# Patient Record
Sex: Male | Born: 1945 | Race: White | Hispanic: No | Marital: Married | Smoking: Light tobacco smoker
Health system: Southern US, Community
[De-identification: ages and names within clinical notes are randomized; demographics above are authoritative.]

## PROBLEM LIST (undated history)

## (undated) DIAGNOSIS — I10 Essential (primary) hypertension: Secondary | ICD-10-CM

## (undated) DIAGNOSIS — Z720 Tobacco use: Secondary | ICD-10-CM

## (undated) DIAGNOSIS — E785 Hyperlipidemia, unspecified: Secondary | ICD-10-CM

## (undated) DIAGNOSIS — R413 Other amnesia: Secondary | ICD-10-CM

## (undated) DIAGNOSIS — I219 Acute myocardial infarction, unspecified: Secondary | ICD-10-CM

## (undated) DIAGNOSIS — H269 Unspecified cataract: Secondary | ICD-10-CM

## (undated) DIAGNOSIS — E119 Type 2 diabetes mellitus without complications: Secondary | ICD-10-CM

## (undated) DIAGNOSIS — M1990 Unspecified osteoarthritis, unspecified site: Secondary | ICD-10-CM

## (undated) DIAGNOSIS — J189 Pneumonia, unspecified organism: Secondary | ICD-10-CM

## (undated) DIAGNOSIS — Z9981 Dependence on supplemental oxygen: Secondary | ICD-10-CM

## (undated) DIAGNOSIS — J449 Chronic obstructive pulmonary disease, unspecified: Secondary | ICD-10-CM

## (undated) DIAGNOSIS — I480 Paroxysmal atrial fibrillation: Secondary | ICD-10-CM

## (undated) DIAGNOSIS — I5032 Chronic diastolic (congestive) heart failure: Secondary | ICD-10-CM

## (undated) DIAGNOSIS — I639 Cerebral infarction, unspecified: Secondary | ICD-10-CM

## (undated) HISTORY — DX: Hyperlipidemia, unspecified: E78.5

## (undated) HISTORY — DX: Tobacco use: Z72.0

## (undated) HISTORY — DX: Essential (primary) hypertension: I10

## (undated) HISTORY — DX: Paroxysmal atrial fibrillation: I48.0

## (undated) HISTORY — PX: JOINT REPLACEMENT: SHX530

## (undated) HISTORY — DX: Other amnesia: R41.3

## (undated) HISTORY — DX: Unspecified cataract: H26.9

## (undated) HISTORY — PX: CARDIAC CATHETERIZATION: SHX172

## (undated) HISTORY — DX: Chronic diastolic (congestive) heart failure: I50.32

## (undated) HISTORY — DX: Type 2 diabetes mellitus without complications: E11.9

## (undated) HISTORY — PX: CORONARY ANGIOPLASTY: SHX604

## (undated) HISTORY — PX: ROTATOR CUFF REPAIR: SHX139

## (undated) HISTORY — PX: TONSILLECTOMY: SUR1361

## (undated) HISTORY — PX: EYE SURGERY: SHX253

## (undated) HISTORY — PX: COLONOSCOPY W/ POLYPECTOMY: SHX1380

---

## 1965-12-14 DIAGNOSIS — R413 Other amnesia: Secondary | ICD-10-CM

## 1965-12-14 HISTORY — DX: Other amnesia: R41.3

## 1999-11-25 ENCOUNTER — Encounter: Payer: Self-pay | Admitting: Specialist

## 1999-11-28 ENCOUNTER — Inpatient Hospital Stay (HOSPITAL_COMMUNITY): Admission: RE | Admit: 1999-11-28 | Discharge: 1999-12-02 | Payer: Self-pay | Admitting: Specialist

## 2000-08-02 ENCOUNTER — Emergency Department (HOSPITAL_COMMUNITY): Admission: EM | Admit: 2000-08-02 | Discharge: 2000-08-02 | Payer: Self-pay | Admitting: Emergency Medicine

## 2000-08-02 ENCOUNTER — Encounter: Payer: Self-pay | Admitting: *Deleted

## 2003-10-08 ENCOUNTER — Observation Stay (HOSPITAL_COMMUNITY): Admission: EM | Admit: 2003-10-08 | Discharge: 2003-10-09 | Payer: Self-pay | Admitting: *Deleted

## 2003-10-08 ENCOUNTER — Encounter: Payer: Self-pay | Admitting: *Deleted

## 2004-05-19 ENCOUNTER — Encounter: Admission: RE | Admit: 2004-05-19 | Discharge: 2004-06-05 | Payer: Self-pay | Admitting: Specialist

## 2004-11-10 ENCOUNTER — Emergency Department (HOSPITAL_COMMUNITY): Admission: EM | Admit: 2004-11-10 | Discharge: 2004-11-10 | Payer: Self-pay | Admitting: Emergency Medicine

## 2004-11-24 ENCOUNTER — Ambulatory Visit: Payer: Self-pay | Admitting: Family Medicine

## 2005-01-05 ENCOUNTER — Ambulatory Visit: Payer: Self-pay | Admitting: Family Medicine

## 2005-01-21 ENCOUNTER — Emergency Department (HOSPITAL_COMMUNITY): Admission: EM | Admit: 2005-01-21 | Discharge: 2005-01-21 | Payer: Self-pay | Admitting: Emergency Medicine

## 2005-02-02 ENCOUNTER — Ambulatory Visit: Payer: Self-pay | Admitting: Family Medicine

## 2005-03-02 ENCOUNTER — Ambulatory Visit: Payer: Self-pay | Admitting: Family Medicine

## 2005-04-21 ENCOUNTER — Ambulatory Visit: Payer: Self-pay | Admitting: Family Medicine

## 2006-03-18 ENCOUNTER — Ambulatory Visit: Payer: Self-pay | Admitting: Cardiology

## 2006-03-18 ENCOUNTER — Observation Stay (HOSPITAL_COMMUNITY): Admission: EM | Admit: 2006-03-18 | Discharge: 2006-03-19 | Payer: Self-pay | Admitting: Emergency Medicine

## 2006-03-31 ENCOUNTER — Ambulatory Visit: Payer: Self-pay | Admitting: Cardiology

## 2006-04-07 ENCOUNTER — Ambulatory Visit: Payer: Self-pay | Admitting: Cardiology

## 2006-04-16 ENCOUNTER — Ambulatory Visit: Payer: Self-pay | Admitting: Cardiology

## 2006-06-07 ENCOUNTER — Ambulatory Visit: Payer: Self-pay | Admitting: Family Medicine

## 2006-08-09 ENCOUNTER — Ambulatory Visit: Payer: Self-pay | Admitting: Family Medicine

## 2006-12-21 ENCOUNTER — Ambulatory Visit: Payer: Self-pay | Admitting: Family Medicine

## 2007-01-11 ENCOUNTER — Ambulatory Visit: Payer: Self-pay | Admitting: Internal Medicine

## 2007-01-27 ENCOUNTER — Ambulatory Visit: Payer: Self-pay | Admitting: Internal Medicine

## 2007-02-08 ENCOUNTER — Ambulatory Visit: Payer: Self-pay | Admitting: Family Medicine

## 2012-01-15 ENCOUNTER — Encounter: Payer: Self-pay | Admitting: Internal Medicine

## 2012-01-20 ENCOUNTER — Ambulatory Visit: Payer: Self-pay | Admitting: Physical Therapy

## 2012-01-27 ENCOUNTER — Ambulatory Visit: Payer: Self-pay | Admitting: Physical Therapy

## 2012-02-01 ENCOUNTER — Ambulatory Visit: Payer: 59 | Attending: Family Medicine | Admitting: Physical Therapy

## 2012-02-01 DIAGNOSIS — M542 Cervicalgia: Secondary | ICD-10-CM | POA: Insufficient documentation

## 2012-02-01 DIAGNOSIS — R5381 Other malaise: Secondary | ICD-10-CM | POA: Insufficient documentation

## 2012-02-01 DIAGNOSIS — IMO0001 Reserved for inherently not codable concepts without codable children: Secondary | ICD-10-CM | POA: Insufficient documentation

## 2012-02-03 ENCOUNTER — Ambulatory Visit: Payer: 59 | Admitting: Physical Therapy

## 2012-02-08 ENCOUNTER — Ambulatory Visit: Payer: 59 | Admitting: Physical Therapy

## 2012-02-11 ENCOUNTER — Ambulatory Visit: Payer: 59 | Admitting: Physical Therapy

## 2012-02-15 ENCOUNTER — Ambulatory Visit: Payer: 59 | Attending: Family Medicine | Admitting: Physical Therapy

## 2012-02-15 DIAGNOSIS — R5381 Other malaise: Secondary | ICD-10-CM | POA: Insufficient documentation

## 2012-02-15 DIAGNOSIS — M542 Cervicalgia: Secondary | ICD-10-CM | POA: Insufficient documentation

## 2012-02-15 DIAGNOSIS — M2569 Stiffness of other specified joint, not elsewhere classified: Secondary | ICD-10-CM | POA: Insufficient documentation

## 2012-02-15 DIAGNOSIS — IMO0001 Reserved for inherently not codable concepts without codable children: Secondary | ICD-10-CM | POA: Insufficient documentation

## 2012-02-17 ENCOUNTER — Ambulatory Visit: Payer: 59 | Admitting: Physical Therapy

## 2012-02-22 ENCOUNTER — Ambulatory Visit: Payer: 59 | Admitting: Physical Therapy

## 2012-02-24 ENCOUNTER — Ambulatory Visit: Payer: 59 | Admitting: Physical Therapy

## 2012-02-29 ENCOUNTER — Ambulatory Visit: Payer: 59 | Admitting: Physical Therapy

## 2012-03-02 ENCOUNTER — Ambulatory Visit: Payer: 59 | Admitting: Physical Therapy

## 2012-03-07 ENCOUNTER — Ambulatory Visit: Payer: 59 | Admitting: Physical Therapy

## 2012-03-09 ENCOUNTER — Ambulatory Visit: Payer: 59 | Admitting: Physical Therapy

## 2012-03-14 ENCOUNTER — Ambulatory Visit: Payer: 59 | Attending: Family Medicine | Admitting: Physical Therapy

## 2012-03-14 DIAGNOSIS — IMO0001 Reserved for inherently not codable concepts without codable children: Secondary | ICD-10-CM | POA: Insufficient documentation

## 2012-03-14 DIAGNOSIS — M2569 Stiffness of other specified joint, not elsewhere classified: Secondary | ICD-10-CM | POA: Insufficient documentation

## 2012-03-14 DIAGNOSIS — R5381 Other malaise: Secondary | ICD-10-CM | POA: Insufficient documentation

## 2012-03-14 DIAGNOSIS — M542 Cervicalgia: Secondary | ICD-10-CM | POA: Insufficient documentation

## 2012-03-16 ENCOUNTER — Ambulatory Visit: Payer: 59 | Admitting: Physical Therapy

## 2012-03-21 ENCOUNTER — Ambulatory Visit: Payer: 59 | Admitting: Physical Therapy

## 2012-03-23 ENCOUNTER — Ambulatory Visit: Payer: 59 | Admitting: Physical Therapy

## 2012-03-28 ENCOUNTER — Ambulatory Visit: Payer: 59 | Admitting: Physical Therapy

## 2012-03-28 ENCOUNTER — Encounter: Payer: 59 | Admitting: Physical Therapy

## 2012-03-30 ENCOUNTER — Encounter: Payer: 59 | Admitting: Physical Therapy

## 2012-05-05 ENCOUNTER — Other Ambulatory Visit: Payer: Self-pay | Admitting: Neurological Surgery

## 2012-05-06 ENCOUNTER — Encounter (HOSPITAL_COMMUNITY): Payer: Self-pay | Admitting: Pharmacy Technician

## 2012-05-12 ENCOUNTER — Encounter (HOSPITAL_COMMUNITY): Payer: Self-pay | Admitting: Surgery

## 2012-05-12 NOTE — Pre-Procedure Instructions (Signed)
[address] GIL  05/12/2012   Your procedure is scheduled on:  Monday May 16, 2012.  Report to [facility] at 0700 AM.  Call this number if you have problems the morning of surgery: [phone]   Remember:   Do not eat food:After Midnight.  May have clear liquids: up to 4 Hours before arrival until 0300 am.  Clear liquids include soda, tea, black coffee, apple or grape juice, broth.  Take these medicines the morning of surgery with A SIP OF WATER: Nebivolol (Bystolic), and Oxycodone (Oxycontin) if needed for pain.   Do not wear jewelry  Do not wear lotions, powders, or cologne . You may wear deodorant.  Men may shave face and neck.  Do not bring valuables to the hospital.  Contacts, dentures or bridgework may not be worn into surgery.  Leave suitcase in the car. After surgery it may be brought to your room.  For patients admitted to the hospital, checkout time is 11:00 AM the day of discharge.   Patients discharged the day of surgery will not be allowed to drive home.  Name and phone number of your driver:   Special Instructions: CHG Shower Use Special Wash: 1/2 bottle night before surgery and 1/2 bottle morning of surgery.   Please read over the following fact sheets that you were given: Pain Booklet, Coughing and Deep Breathing, MRSA Information and Surgical Site Infection Prevention

## 2012-05-13 ENCOUNTER — Encounter (HOSPITAL_COMMUNITY)
Admission: RE | Admit: 2012-05-13 | Discharge: 2012-05-13 | Disposition: A | Discharge status: Home / Self Care | Payer: 59 | Source: Ambulatory Visit | Attending: Neurological Surgery | Admitting: Neurological Surgery

## 2012-05-13 LAB — CBC
HCT: 47.5 % (ref 39.0–52.0)
Platelets: 238 10*3/uL (ref 150–400)
RDW: 13.9 % (ref 11.5–15.5)
WBC: 8.9 10*3/uL (ref 4.0–10.5)

## 2012-05-13 LAB — COMPREHENSIVE METABOLIC PANEL
ALT: 9 U/L (ref 0–53)
AST: 13 U/L (ref 0–37)
Albumin: 3.6 g/dL (ref 3.5–5.2)
Alkaline Phosphatase: 93 U/L (ref 39–117)
Chloride: 99 mEq/L (ref 96–112)
Potassium: 3.9 mEq/L (ref 3.5–5.1)
Sodium: 136 mEq/L (ref 135–145)
Total Bilirubin: 0.4 mg/dL (ref 0.3–1.2)

## 2012-05-13 LAB — SURGICAL PCR SCREEN: MRSA, PCR: NEGATIVE

## 2012-05-13 NOTE — Progress Notes (Signed)
Received today notes on this patient from NP Paulita Cradle.  Pt has also seen Dr Tommi Rumps Natasha Bence . For chest pain that  Was called "pleuritic ".  Notes  Were placed in this chart.

## 2012-05-15 MED ORDER — CEFAZOLIN SODIUM 1-5 GM-% IV SOLN
1.0000 g | INTRAVENOUS | Status: AC
  Administered 2012-05-16: 1 g via INTRAVENOUS
  Filled 2012-05-15: qty 50

## 2012-05-16 ENCOUNTER — Encounter (HOSPITAL_COMMUNITY): Payer: Self-pay | Admitting: Anesthesiology

## 2012-05-16 ENCOUNTER — Inpatient Hospital Stay (HOSPITAL_COMMUNITY)
Admission: RE | Admit: 2012-05-16 | Discharge: 2012-05-16 | DRG: 473 | Disposition: A | Discharge status: Home / Self Care | Payer: 59 | Source: Ambulatory Visit | Attending: Neurological Surgery | Admitting: Neurological Surgery

## 2012-05-16 ENCOUNTER — Ambulatory Visit (HOSPITAL_COMMUNITY): Payer: 59

## 2012-05-16 ENCOUNTER — Inpatient Hospital Stay (HOSPITAL_COMMUNITY): Payer: 59

## 2012-05-16 ENCOUNTER — Encounter (HOSPITAL_COMMUNITY)
Admission: RE | Disposition: A | Discharge status: Home / Self Care | Payer: Self-pay | Source: Ambulatory Visit | Attending: Neurological Surgery

## 2012-05-16 ENCOUNTER — Encounter (HOSPITAL_COMMUNITY): Payer: Self-pay | Admitting: *Deleted

## 2012-05-16 ENCOUNTER — Ambulatory Visit (HOSPITAL_COMMUNITY): Payer: 59 | Admitting: Anesthesiology

## 2012-05-16 DIAGNOSIS — M5000 Cervical disc disorder with myelopathy, unspecified cervical region: Principal | ICD-10-CM | POA: Diagnosis present

## 2012-05-16 DIAGNOSIS — M47812 Spondylosis without myelopathy or radiculopathy, cervical region: Secondary | ICD-10-CM

## 2012-05-16 DIAGNOSIS — Z9861 Coronary angioplasty status: Secondary | ICD-10-CM

## 2012-05-16 DIAGNOSIS — F17200 Nicotine dependence, unspecified, uncomplicated: Secondary | ICD-10-CM | POA: Diagnosis present

## 2012-05-16 DIAGNOSIS — I252 Old myocardial infarction: Secondary | ICD-10-CM

## 2012-05-16 DIAGNOSIS — I2510 Atherosclerotic heart disease of native coronary artery without angina pectoris: Secondary | ICD-10-CM | POA: Diagnosis present

## 2012-05-16 DIAGNOSIS — Z01812 Encounter for preprocedural laboratory examination: Secondary | ICD-10-CM

## 2012-05-16 HISTORY — DX: Unspecified osteoarthritis, unspecified site: M19.90

## 2012-05-16 HISTORY — PX: ANTERIOR CERVICAL DECOMP/DISCECTOMY FUSION: SHX1161

## 2012-05-16 HISTORY — DX: Pneumonia, unspecified organism: J18.9

## 2012-05-16 HISTORY — DX: Acute myocardial infarction, unspecified: I21.9

## 2012-05-16 SURGERY — ANTERIOR CERVICAL DECOMPRESSION/DISCECTOMY FUSION 2 LEVELS
Anesthesia: General | Site: Neck | Wound class: Clean

## 2012-05-16 MED ORDER — MENTHOL 3 MG MT LOZG: 1.0000 | LOZENGE | OROMUCOSAL | Status: DC | PRN

## 2012-05-16 MED ORDER — FENTANYL CITRATE 0.05 MG/ML IJ SOLN
INTRAMUSCULAR | Status: DC | PRN
  Administered 2012-05-16: 100 ug via INTRAVENOUS
  Administered 2012-05-16: 50 ug via INTRAVENOUS
  Administered 2012-05-16: 100 ug via INTRAVENOUS

## 2012-05-16 MED ORDER — OXYCODONE HCL 10 MG PO TB12: 10.0000 mg | ORAL_TABLET | Freq: Two times a day (BID) | ORAL | Status: DC

## 2012-05-16 MED ORDER — BACITRACIN 50000 UNITS IM SOLR
INTRAMUSCULAR | Status: AC
  Filled 2012-05-16: qty 1

## 2012-05-16 MED ORDER — HYDROMORPHONE HCL PF 1 MG/ML IJ SOLN
0.2500 mg | INTRAMUSCULAR | Status: DC | PRN
  Administered 2012-05-16 (×3): 0.5 mg via INTRAVENOUS

## 2012-05-16 MED ORDER — THROMBIN 5000 UNITS EX KIT
PACK | CUTANEOUS | Status: DC | PRN
  Administered 2012-05-16 (×2): 5000 [IU] via TOPICAL

## 2012-05-16 MED ORDER — SODIUM CHLORIDE 0.9 % IJ SOLN: 3.0000 mL | INTRAMUSCULAR | Status: DC | PRN

## 2012-05-16 MED ORDER — DIAZEPAM 5 MG PO TABS: 5.0000 mg | ORAL_TABLET | Freq: Four times a day (QID) | ORAL | Status: AC | PRN

## 2012-05-16 MED ORDER — ROCURONIUM BROMIDE 100 MG/10ML IV SOLN
INTRAVENOUS | Status: DC | PRN
  Administered 2012-05-16: 10 mg via INTRAVENOUS
  Administered 2012-05-16: 50 mg via INTRAVENOUS

## 2012-05-16 MED ORDER — ALUM & MAG HYDROXIDE-SIMETH 200-200-20 MG/5ML PO SUSP: 30.0000 mL | Freq: Four times a day (QID) | ORAL | Status: DC | PRN

## 2012-05-16 MED ORDER — LACTATED RINGERS IV SOLN
INTRAVENOUS | Status: DC | PRN
  Administered 2012-05-16 (×2): via INTRAVENOUS

## 2012-05-16 MED ORDER — HYDROMORPHONE HCL PF 1 MG/ML IJ SOLN
INTRAMUSCULAR | Status: AC
  Administered 2012-05-16: 0.5 mg via INTRAVENOUS
  Filled 2012-05-16: qty 1

## 2012-05-16 MED ORDER — LIDOCAINE-EPINEPHRINE 1 %-1:100000 IJ SOLN
INTRAMUSCULAR | Status: DC | PRN
  Administered 2012-05-16: 5 mL

## 2012-05-16 MED ORDER — OXYCODONE-ACETAMINOPHEN 5-325 MG PO TABS: 1.0000 | ORAL_TABLET | ORAL | Status: AC | PRN

## 2012-05-16 MED ORDER — ACETAMINOPHEN 650 MG RE SUPP: 650.0000 mg | RECTAL | Status: DC | PRN

## 2012-05-16 MED ORDER — 0.9 % SODIUM CHLORIDE (POUR BTL) OPTIME
TOPICAL | Status: DC | PRN
  Administered 2012-05-16: 1000 mL

## 2012-05-16 MED ORDER — LIDOCAINE HCL 4 % MT SOLN
OROMUCOSAL | Status: DC | PRN
  Administered 2012-05-16: 4 mL via TOPICAL

## 2012-05-16 MED ORDER — ONDANSETRON HCL 4 MG/2ML IJ SOLN
INTRAMUSCULAR | Status: DC | PRN
  Administered 2012-05-16: 4 mg via INTRAVENOUS

## 2012-05-16 MED ORDER — PHENOL 1.4 % MT LIQD: 1.0000 | OROMUCOSAL | Status: DC | PRN

## 2012-05-16 MED ORDER — SODIUM CHLORIDE 0.9 % IJ SOLN: 3.0000 mL | Freq: Two times a day (BID) | INTRAMUSCULAR | Status: DC

## 2012-05-16 MED ORDER — SODIUM CHLORIDE 0.9 % IR SOLN
Status: DC | PRN
  Administered 2012-05-16: 11:00:00

## 2012-05-16 MED ORDER — BUPIVACAINE HCL (PF) 0.5 % IJ SOLN
INTRAMUSCULAR | Status: DC | PRN
  Administered 2012-05-16: 5 mL

## 2012-05-16 MED ORDER — NEBIVOLOL HCL 5 MG PO TABS: 5.0000 mg | ORAL_TABLET | Freq: Every day | ORAL | Status: DC

## 2012-05-16 MED ORDER — OXYCODONE-ACETAMINOPHEN 5-325 MG PO TABS
1.0000 | ORAL_TABLET | ORAL | Status: DC | PRN
  Administered 2012-05-16: 2 via ORAL
  Filled 2012-05-16: qty 2

## 2012-05-16 MED ORDER — DEXAMETHASONE SODIUM PHOSPHATE 4 MG/ML IJ SOLN
INTRAMUSCULAR | Status: DC | PRN
  Administered 2012-05-16: 10 mg via INTRAVENOUS

## 2012-05-16 MED ORDER — SODIUM CHLORIDE 0.9 % IV SOLN: 250.0000 mL | INTRAVENOUS | Status: DC

## 2012-05-16 MED ORDER — LIDOCAINE HCL (CARDIAC) 20 MG/ML IV SOLN
INTRAVENOUS | Status: DC | PRN
  Administered 2012-05-16: 100 mg via INTRAVENOUS

## 2012-05-16 MED ORDER — EPHEDRINE SULFATE 50 MG/ML IJ SOLN
INTRAMUSCULAR | Status: DC | PRN
  Administered 2012-05-16 (×3): 5 mg via INTRAVENOUS

## 2012-05-16 MED ORDER — PROPOFOL 10 MG/ML IV EMUL
INTRAVENOUS | Status: DC | PRN
  Administered 2012-05-16: 200 mg via INTRAVENOUS

## 2012-05-16 MED ORDER — DIAZEPAM 5 MG PO TABS
5.0000 mg | ORAL_TABLET | Freq: Four times a day (QID) | ORAL | Status: DC | PRN
  Administered 2012-05-16: 5 mg via ORAL
  Filled 2012-05-16: qty 1

## 2012-05-16 MED ORDER — HEMOSTATIC AGENTS (NO CHARGE) OPTIME
TOPICAL | Status: DC | PRN
  Administered 2012-05-16: 1 via TOPICAL

## 2012-05-16 MED ORDER — MORPHINE SULFATE 2 MG/ML IJ SOLN: 1.0000 mg | INTRAMUSCULAR | Status: DC | PRN

## 2012-05-16 MED ORDER — ONDANSETRON HCL 4 MG/2ML IJ SOLN: 4.0000 mg | INTRAMUSCULAR | Status: DC | PRN

## 2012-05-16 MED ORDER — ONDANSETRON HCL 4 MG/2ML IJ SOLN: 4.0000 mg | Freq: Once | INTRAMUSCULAR | Status: DC | PRN

## 2012-05-16 MED ORDER — ACETAMINOPHEN 325 MG PO TABS: 650.0000 mg | ORAL_TABLET | ORAL | Status: DC | PRN

## 2012-05-16 MED ORDER — SODIUM CHLORIDE 0.9 % IV SOLN
INTRAVENOUS | Status: AC
  Filled 2012-05-16: qty 500

## 2012-05-16 MED ORDER — HYDROMORPHONE HCL PF 1 MG/ML IJ SOLN
INTRAMUSCULAR | Status: AC
  Filled 2012-05-16: qty 1

## 2012-05-16 SURGICAL SUPPLY — 55 items
ADH SKN CLS APL DERMABOND .7 (GAUZE/BANDAGES/DRESSINGS) ×1
BAG DECANTER FOR FLEXI CONT (MISCELLANEOUS) ×2 IMPLANT
BANDAGE GAUZE ELAST BULKY 4 IN (GAUZE/BANDAGES/DRESSINGS) ×2 IMPLANT
BIT DRILL 14MM (INSTRUMENTS) IMPLANT
BIT DRILL NEURO 2X3.1 SFT TUCH (MISCELLANEOUS) ×1 IMPLANT
BONE CERVICAL 6MM LRG (Bone Implant) ×2 IMPLANT
BUR BARREL STRAIGHT FLUTE 4.0 (BURR) ×2 IMPLANT
CANISTER SUCTION 2500CC (MISCELLANEOUS) ×2 IMPLANT
CLOTH BEACON ORANGE TIMEOUT ST (SAFETY) ×2 IMPLANT
CONT SPEC 4OZ CLIKSEAL STRL BL (MISCELLANEOUS) ×2 IMPLANT
DECANTER SPIKE VIAL GLASS SM (MISCELLANEOUS) ×2 IMPLANT
DERMABOND ADVANCED (GAUZE/BANDAGES/DRESSINGS) ×1
DERMABOND ADVANCED .7 DNX12 (GAUZE/BANDAGES/DRESSINGS) ×1 IMPLANT
DRAPE LAPAROTOMY 100X72 PEDS (DRAPES) ×2 IMPLANT
DRAPE MICROSCOPE LEICA (MISCELLANEOUS) IMPLANT
DRAPE POUCH INSTRU U-SHP 10X18 (DRAPES) ×2 IMPLANT
DRILL 14MM (INSTRUMENTS) ×2
DRILL NEURO 2X3.1 SOFT TOUCH (MISCELLANEOUS) ×2
DURAPREP 6ML APPLICATOR 50/CS (WOUND CARE) ×2 IMPLANT
ELECT REM PT RETURN 9FT ADLT (ELECTROSURGICAL) ×2
ELECTRODE REM PT RTRN 9FT ADLT (ELECTROSURGICAL) ×1 IMPLANT
GAUZE SPONGE 4X4 16PLY XRAY LF (GAUZE/BANDAGES/DRESSINGS) IMPLANT
GLOVE BIOGEL PI IND STRL 8.5 (GLOVE) ×1 IMPLANT
GLOVE BIOGEL PI INDICATOR 8.5 (GLOVE) ×1
GLOVE ECLIPSE 8.5 STRL (GLOVE) ×3 IMPLANT
GLOVE EXAM NITRILE LRG STRL (GLOVE) IMPLANT
GLOVE EXAM NITRILE MD LF STRL (GLOVE) IMPLANT
GLOVE EXAM NITRILE XL STR (GLOVE) IMPLANT
GLOVE EXAM NITRILE XS STR PU (GLOVE) IMPLANT
GLOVE INDICATOR 7.0 STRL GRN (GLOVE) ×1 IMPLANT
GLOVE SURG SS PI 6.5 STRL IVOR (GLOVE) ×2 IMPLANT
GOWN BRE IMP SLV AUR LG STRL (GOWN DISPOSABLE) ×1 IMPLANT
GOWN BRE IMP SLV AUR XL STRL (GOWN DISPOSABLE) ×1 IMPLANT
GOWN STRL REIN 2XL LVL4 (GOWN DISPOSABLE) ×2 IMPLANT
HEAD HALTER (SOFTGOODS) ×2 IMPLANT
KIT BASIN OR (CUSTOM PROCEDURE TRAY) ×2 IMPLANT
KIT ROOM TURNOVER OR (KITS) ×2 IMPLANT
NDL SPNL 22GX3.5 QUINCKE BK (NEEDLE) ×1 IMPLANT
NEEDLE HYPO 22GX1.5 SAFETY (NEEDLE) ×2 IMPLANT
NEEDLE SPNL 22GX3.5 QUINCKE BK (NEEDLE) ×4 IMPLANT
NS IRRIG 1000ML POUR BTL (IV SOLUTION) ×2 IMPLANT
PACK LAMINECTOMY NEURO (CUSTOM PROCEDURE TRAY) ×2 IMPLANT
PAD ARMBOARD 7.5X6 YLW CONV (MISCELLANEOUS) ×6 IMPLANT
PLATE 32MM (Plate) ×1 IMPLANT
PUTTY BONE 2.5CC ×1 IMPLANT
RUBBERBAND STERILE (MISCELLANEOUS) IMPLANT
SCREW 14MM (Screw) ×6 IMPLANT
SPONGE GAUZE 4X4 12PLY (GAUZE/BANDAGES/DRESSINGS) ×2 IMPLANT
SPONGE INTESTINAL PEANUT (DISPOSABLE) ×2 IMPLANT
SPONGE SURGIFOAM ABS GEL SZ50 (HEMOSTASIS) ×2 IMPLANT
SUT VIC AB 3-0 SH 8-18 (SUTURE) ×4 IMPLANT
SYR 20ML ECCENTRIC (SYRINGE) ×2 IMPLANT
TOWEL OR 17X24 6PK STRL BLUE (TOWEL DISPOSABLE) ×2 IMPLANT
TOWEL OR 17X26 10 PK STRL BLUE (TOWEL DISPOSABLE) ×2 IMPLANT
WATER STERILE IRR 1000ML POUR (IV SOLUTION) ×2 IMPLANT

## 2012-05-16 NOTE — Anesthesia Procedure Notes (Signed)
Procedure Name: Intubation Date/Time: 05/16/2012 10:28 AM Performed by: Sherie Don Pre-anesthesia Checklist: Patient identified, Emergency Drugs available, Suction available, Patient being monitored and Timeout performed Patient Re-evaluated:Patient Re-evaluated prior to inductionOxygen Delivery Method: Circle system utilized Preoxygenation: Pre-oxygenation with 100% oxygen Intubation Type: IV induction Ventilation: Mask ventilation without difficulty Laryngoscope Size: Mac and 3 Grade View: Grade I Tube type: Oral Tube size: 7.5 mm Number of attempts: 1 Airway Equipment and Method: Stylet and LTA kit utilized Placement Confirmation: ETT inserted through vocal cords under direct vision,  positive ETCO2 and breath sounds checked- equal and bilateral Secured at: 23 cm Tube secured with: Tape Dental Injury: Teeth and Oropharynx as per pre-operative assessment

## 2012-05-16 NOTE — H&P (Signed)
CHIEF COMPLAINT:   Neck, shoulder and left arm pain and weakness since December of 2012.    HISTORY OF PRESENT ILLNESS:  Luis Salinas is a 66 year old, right-handed individual who tells me that rather insidiously he started developing pain in his neck, shoulder and left arm.  This started some time in December.  He notes that it got progressively worse and he was ultimately seen and evaluated by Dr. Lavada Mesi.  He has had a program of physical therapy with a series of exercises.  He has had traction to his neck.  He has had the passage of time and ultimately things have not improved.  He underwent an MRI of his cervical spine back in March of this year and this reveals the presence of advanced spondylitic disease at C5-6 and C6-C7.  I had the opportunity to review the MRI of the cervical spine with him and I note that he has a broad-based disc herniation at C6-C7 eccentric into the foramen on that left side.  He has advanced spondylitic disease at C5-C6 with biforaminal stenosis worse on the left than on the right.  At C4-C5 he has some modest spondylosis, but the foramina appear amply patent.  Clinically, the patient notes that the strength in his left arm has gotten progressively worse.  He has numbness, tingling and dysesthesias in that left upper extremity and he finds that certain positions, particularly extending his head back, tend to aggravate the symptoms in that left arm.  He also notes that he has had episodes of coldness in that hand that have been persisting and recurring.  Despite the passage of time, despite occasional use of some strong medication including Percocet 10 mg up to 2 tablets a day have been used to help control the pain.    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  He is doing well from that standpoint.  He has no other medical problems.    MEDICATIONS:    He notes that he takes low dose Aspirin a day.  He takes Bystolic 5 mg daily,  Crestor and the Oxycodone for pain.    DRUG ALLERGIES:    He notes no allergies to any pain medications.    REVIEW OF SYSTEMS:   His systems review is notable for the arm weakness, arm pain and neck pain on a 14-point review sheet noted in the office today.     PERSONAL HISTORY:   The patient does note that he does have a history of smoking, although at the current time, he occasionally smokes a cigar and does not inhale, but rarely.    PHYSICAL EXAMINATION:  He is an alert and oriented individual in moderate distress with his left shoulder and arm.  He has some tenderness in the supraclavicular fossas and to confrontational testing I note that he has weakness in the triceps, wrist extensor, finger extensor and the biceps mildly on the left side compared to the right side.  Atrophy is noted in the triceps on the left side.  His deltoid strength, scalene and supraclavicular muscular strength appears intact.  His sensation is intact in the left upper extremity, though diminished compared to his right upper extremity to vibration in the distal upper extremities.    IMPRESSION:    The patient has evidence of advanced spondylitic changes with a broad-based disc protrusion at C6-7, chronic degenerative changes and foraminal stenosis at C5-C6.  I indicated to the patient that given the  length and duration of his symptoms, given the weakness and atrophy that he has in that left upper extremity, he would be best served with an anterior cervical decompression at C5-6 and at C6-C7.  I discussed with him the surgery and the approach via the front of the neck, major risks and concerns of the surgery itself.  All of these things notwithstanding, aside from his singular risk factor of the smoking history, I believe that he would do well with the surgery and the surgery should give him relief of the symptoms as he heals.  I did note to him that he does have some autonomic changes in that left upper extremity with the  coldness.  The recovery from those symptoms is somewhat less predictable, but given the problems that he is experiencing now, I believe that with a decompression and arthrodesis at the lower two levels of his neck, he should have some substantial relief of the worst of his symptoms.  After a brief discussion of the surgery itself, he would like to proceed at the earliest convenience and we will make arrangements as best possible.

## 2012-05-16 NOTE — Discharge Summary (Signed)
  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

## 2012-05-16 NOTE — Preoperative (Signed)
Beta Blockers   Reason not to administer Beta Blockers:took bystolic this am 

## 2012-05-16 NOTE — Transfer of Care (Signed)
Immediate Anesthesia Transfer of Care Note  Patient: Luis Salinas  Procedure(s) Performed: Procedure(s) (LRB): ANTERIOR CERVICAL DECOMPRESSION/DISCECTOMY FUSION 2 LEVELS (N/A)  Patient Location: PACU  Anesthesia Type: General  Level of Consciousness: sedated and patient cooperative  Airway & Oxygen Therapy: Patient Spontanous Breathing and Patient connected to face mask oxygen  Post-op Assessment: Report given to PACU RN  Post vital signs: Reviewed and stable  Complications: No apparent anesthesia complications

## 2012-05-16 NOTE — Anesthesia Postprocedure Evaluation (Signed)
  Anesthesia Post-op Note  Patient: Luis Salinas  Procedure(s) Performed: Procedure(s) (LRB): ANTERIOR CERVICAL DECOMPRESSION/DISCECTOMY FUSION 2 LEVELS (N/A)  Patient Location: PACU  Anesthesia Type: General  Level of Consciousness: awake, alert , oriented and patient cooperative  Airway and Oxygen Therapy: Patient Spontanous Breathing and Patient connected to nasal cannula oxygen  Post-op Pain: mild  Post-op Assessment: Post-op Vital signs reviewed, Patient's Cardiovascular Status Stable, Respiratory Function Stable, Patent Airway, No signs of Nausea or vomiting and Pain level controlled  Post-op Vital Signs: stable  Complications: No apparent anesthesia complications 

## 2012-05-16 NOTE — Op Note (Signed)
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 Asst.: Dr. Lelon Perla M.D. Indications: Patient is a 67 year old individual is had significant problems with neck shoulder and arm pain primarily on the left side he has advanced spondylitic disease at C5-6 and C6-C7 is failed efforts at conservative management and has been advised regarding surgery. Procedure: The patient was brought to the operating room placed on the table in supine position. After the smooth induction of general endotracheal anesthesia neck was placed in 5 pounds of halter traction and prepped with alcohol and DuraPrep. After sterile draping and appropriate timeout procedure a transverse incision was created in the left side of the neck and carried down to the platysma. The plane between the sternocleidomastoid and strap muscles dissected bluntly until the prevertebral space was reached. The first identifiable disc space was noted to be C5-C6 on a localizing radiograph. The dissection was then undertaken in the longus coli muscle to allow placement of a self-retaining Caspar type retractor.  The anterior longitudinal ligament was opened at the C5 and C6 and ventral osteophytes were removed with a Leksell rongeur and Kerrison punch. Interspace was cleared of significant quantity of the degenerated disc material ~region of the posterior longitudinal ligament was reached. Dissection was carried out using a high-speed drill and 3-0 Karlin curettes. Uncinate processes were drilled down and removed and osteophytes from the inferior margin of the body of C5 were removed with a Kerrison 2 mm gold punch. After the central  canal and lateral recesses were well decompressed the stasis was achieved with the bipolar cautery and some small pledgets of Gelfoam soaked in thrombin that were later irrigated away.  A 6 mm transgraft was then prepared by enlarging the central cavity and filling with demineralized bone matrix and placing into the interspace. C6-C7 Was decompressed and fused in a similar fashion. Next the retractor was removed and a 32 mm  trestle plate was placed over the vertebral bodies and secured with 14 mm variable angle screws. A final localizing radiograph identified the position of the surgical construct. The stasis was achieved in the soft tissues and then the platysma was closed with 3-0 Vicryl in an interrupted fashion and 3-0 Vicryl was used in the subcuticular tissue. Blood loss was estimated at 100 cc

## 2012-05-16 NOTE — Progress Notes (Signed)
Orthopedic Tech Progress Note Patient Details:  Luis Salinas [birth year] [id number]  [name], male   DOB: 05/10/1946, 66 y.o.   MRN: [id number] Brace order completed by Storm Frisk, Safira Proffit 05/16/2012, 5:45 PM

## 2012-05-16 NOTE — Progress Notes (Signed)
Pt discharged home. Prescriptions and discharge instructions given with verbalization of understanding. Incision on neck with no s/s of infection. Vitals stable. Denied pain upon discharge. Escorted out of this unit by the nurse tech, accompanied by his wife.

## 2012-05-16 NOTE — Anesthesia Preprocedure Evaluation (Addendum)
Anesthesia Evaluation  Patient identified by MRN, date of birth, ID band Patient awake    Reviewed: Allergy & Precautions, H&P , NPO status , Patient's Chart, lab work & pertinent test results  Airway Mallampati: I TM Distance: >3 FB Neck ROM: full    Dental   Pulmonary          Cardiovascular Exercise Tolerance: Good + Past MI Rhythm:regular Rate:Normal     Neuro/Psych    GI/Hepatic   Endo/Other    Renal/GU      Musculoskeletal   Abdominal   Peds  Hematology   Anesthesia Other Findings   Reproductive/Obstetrics                           Anesthesia Physical Anesthesia Plan  ASA: II  Anesthesia Plan: General   Post-op Pain Management:    Induction: Intravenous  Airway Management Planned: Oral ETT  Additional Equipment:   Intra-op Plan:   Post-operative Plan: Extubation in OR  Informed Consent: I have reviewed the patients History and Physical, chart, labs and discussed the procedure including the risks, benefits and alternatives for the proposed anesthesia with the patient or authorized representative who has indicated his/her understanding and acceptance.     Plan Discussed with: CRNA, Anesthesiologist and Surgeon  Anesthesia Plan Comments:         Anesthesia Quick Evaluation

## 2012-05-17 ENCOUNTER — Encounter (HOSPITAL_COMMUNITY): Payer: Self-pay | Admitting: Neurological Surgery

## 2012-05-17 NOTE — Anesthesia Postprocedure Evaluation (Signed)
  Anesthesia Post-op Note  Patient: Luis Salinas  Procedure(s) Performed: Procedure(s) (LRB): ANTERIOR CERVICAL DECOMPRESSION/DISCECTOMY FUSION 2 LEVELS (N/A)  Patient Location: PACU  Anesthesia Type: General  Level of Consciousness: awake, alert , oriented and patient cooperative  Airway and Oxygen Therapy: Patient Spontanous Breathing and Patient connected to nasal cannula oxygen  Post-op Pain: mild  Post-op Assessment: Post-op Vital signs reviewed, Patient's Cardiovascular Status Stable, Respiratory Function Stable, Patent Airway, No signs of Nausea or vomiting and Pain level controlled  Post-op Vital Signs: stable  Complications: No apparent anesthesia complications

## 2012-09-29 ENCOUNTER — Encounter: Payer: Self-pay | Admitting: Internal Medicine

## 2013-03-16 ENCOUNTER — Inpatient Hospital Stay (HOSPITAL_COMMUNITY)
Admission: EM | Admit: 2013-03-16 | Discharge: 2013-03-17 | DRG: 066 | Disposition: A | Discharge status: Home / Self Care | Payer: 59 | Attending: Internal Medicine | Admitting: Internal Medicine

## 2013-03-16 ENCOUNTER — Emergency Department (HOSPITAL_COMMUNITY): Payer: 59

## 2013-03-16 ENCOUNTER — Encounter (HOSPITAL_COMMUNITY): Payer: Self-pay | Admitting: Radiology

## 2013-03-16 DIAGNOSIS — E785 Hyperlipidemia, unspecified: Secondary | ICD-10-CM | POA: Diagnosis present

## 2013-03-16 DIAGNOSIS — I252 Old myocardial infarction: Secondary | ICD-10-CM

## 2013-03-16 DIAGNOSIS — R531 Weakness: Secondary | ICD-10-CM

## 2013-03-16 DIAGNOSIS — J4489 Other specified chronic obstructive pulmonary disease: Secondary | ICD-10-CM | POA: Diagnosis present

## 2013-03-16 DIAGNOSIS — I2510 Atherosclerotic heart disease of native coronary artery without angina pectoris: Secondary | ICD-10-CM | POA: Diagnosis present

## 2013-03-16 DIAGNOSIS — R4789 Other speech disturbances: Secondary | ICD-10-CM | POA: Diagnosis present

## 2013-03-16 DIAGNOSIS — R29810 Facial weakness: Secondary | ICD-10-CM | POA: Diagnosis present

## 2013-03-16 DIAGNOSIS — F1210 Cannabis abuse, uncomplicated: Secondary | ICD-10-CM | POA: Diagnosis present

## 2013-03-16 DIAGNOSIS — Z981 Arthrodesis status: Secondary | ICD-10-CM

## 2013-03-16 DIAGNOSIS — Z96659 Presence of unspecified artificial knee joint: Secondary | ICD-10-CM

## 2013-03-16 DIAGNOSIS — G459 Transient cerebral ischemic attack, unspecified: Secondary | ICD-10-CM | POA: Diagnosis present

## 2013-03-16 DIAGNOSIS — R4182 Altered mental status, unspecified: Secondary | ICD-10-CM | POA: Diagnosis present

## 2013-03-16 DIAGNOSIS — I6330 Cerebral infarction due to thrombosis of unspecified cerebral artery: Principal | ICD-10-CM | POA: Diagnosis present

## 2013-03-16 DIAGNOSIS — J449 Chronic obstructive pulmonary disease, unspecified: Secondary | ICD-10-CM | POA: Diagnosis present

## 2013-03-16 DIAGNOSIS — R5381 Other malaise: Secondary | ICD-10-CM | POA: Diagnosis present

## 2013-03-16 DIAGNOSIS — R404 Transient alteration of awareness: Secondary | ICD-10-CM | POA: Diagnosis present

## 2013-03-16 DIAGNOSIS — I10 Essential (primary) hypertension: Secondary | ICD-10-CM | POA: Diagnosis present

## 2013-03-16 DIAGNOSIS — Z955 Presence of coronary angioplasty implant and graft: Secondary | ICD-10-CM

## 2013-03-16 DIAGNOSIS — Z7982 Long term (current) use of aspirin: Secondary | ICD-10-CM

## 2013-03-16 DIAGNOSIS — Z9861 Coronary angioplasty status: Secondary | ICD-10-CM

## 2013-03-16 DIAGNOSIS — F17200 Nicotine dependence, unspecified, uncomplicated: Secondary | ICD-10-CM | POA: Diagnosis present

## 2013-03-16 DIAGNOSIS — M129 Arthropathy, unspecified: Secondary | ICD-10-CM | POA: Diagnosis present

## 2013-03-16 DIAGNOSIS — R5383 Other fatigue: Secondary | ICD-10-CM

## 2013-03-16 DIAGNOSIS — I639 Cerebral infarction, unspecified: Secondary | ICD-10-CM

## 2013-03-16 HISTORY — DX: Chronic obstructive pulmonary disease, unspecified: J44.9

## 2013-03-16 LAB — CBC
HCT: 44 % (ref 39.0–52.0)
MCHC: 36.1 g/dL — ABNORMAL HIGH (ref 30.0–36.0)
MCV: 82.4 fL (ref 78.0–100.0)
RDW: 14.7 % (ref 11.5–15.5)

## 2013-03-16 LAB — TROPONIN I: Troponin I: <0.3 ng/mL (ref <0.30)

## 2013-03-16 LAB — DIFFERENTIAL
Basophils Absolute: 0.1 10*3/uL (ref 0.0–0.1)
Basophils Relative: 0 % (ref 0–1)
Eosinophils Relative: 0 % (ref 0–5)
Monocytes Absolute: 0.8 10*3/uL (ref 0.1–1.0)
Monocytes Relative: 5 % (ref 3–12)

## 2013-03-16 LAB — POCT I-STAT, CHEM 8
BUN: 12 mg/dL (ref 6–23)
Calcium, Ion: 1.12 mmol/L — ABNORMAL LOW (ref 1.13–1.30)
Chloride: 102 mEq/L (ref 96–112)
Glucose, Bld: 144 mg/dL — ABNORMAL HIGH (ref 70–99)
Potassium: 3.6 mEq/L (ref 3.5–5.1)

## 2013-03-16 LAB — COMPREHENSIVE METABOLIC PANEL
AST: 10 U/L (ref 0–37)
Albumin: 3.6 g/dL (ref 3.5–5.2)
BUN: 13 mg/dL (ref 6–23)
CO2: 25 mEq/L (ref 19–32)
Calcium: 8.9 mg/dL (ref 8.4–10.5)
Creatinine, Ser: 0.99 mg/dL (ref 0.50–1.35)
GFR calc non Af Amer: 83 mL/min — ABNORMAL LOW (ref >90)

## 2013-03-16 LAB — GLUCOSE, CAPILLARY

## 2013-03-16 MED ORDER — SODIUM CHLORIDE 0.9 % IV SOLN
INTRAVENOUS | Status: AC
  Administered 2013-03-16: 23:00:00 via INTRAVENOUS

## 2013-03-16 NOTE — Code Documentation (Signed)
Patient was at home watching TV and spoke to his wife around 1830 in his normal state of health. He had complained of generalized weakness throughout the week and stated he had been feeling 'out of sync'. Patient's wife arrived home from work around 2100 and found the patient unable to respond to her questions with a facial droop. Upon EMS arrival patient was awake with left sided weakness. Code stroke called at 2144, patient arrived at 2200 via EMS, EDP exam at 2200, stroke team arrived 2157, LSN at 1830 per wife during phone conversation, patient is unsure what time he fell asleep on the couch, patient arrived to CT at 2205, phlebotomist arrived at 2155, CT read by Dr. Amada Jupiter at 2209. NIH on arrival to ED is 0. No deficits noted. Code stroke cancelled at 2225.

## 2013-03-16 NOTE — ED Notes (Signed)
Report given Theophilus Bones, pt transported to 4W via stretcher on a monitor by [employer]

## 2013-03-16 NOTE — ED Notes (Signed)
Per [facility] EMS, pt from home. Spouse came home at 2100 found pt unresponsive with facial droop, upon EMS arrival pt found responsive w/right side weakness. Pt states he has not been feeling himself for a week. VSS BP 146/75, HR 68, RR 18, CBG 162. 18 G RAC, 20 G LLFA

## 2013-03-16 NOTE — Consult Note (Addendum)
Reason for Consult: Transient episode of left-sided weakness Referring Physician: Ranae Palms, D  CC: Unresponsiveness  History is obtained from: Wife, patient  HPI: Luis Salinas is a 67 y.o. male with a history of myocardial infarction who presents with an episode of decreased responsiveness and left-sided weakness. His wife got to him on the phone around 6:30 PM, and then when she returned home around 9 PM she found him sitting in front of the television appearing to be asleep. When she tried to awaken him, he was initially unresponsive, but subsequently became more responsive. When EMS arrived, he was able follow commands with good strength on the right side, but unable to give good strength on the left. His wife also notes that his face was "drawing up." She also noticed that he was licking his lips. He states that he remembers watching TV, feeling sleepy, and then nodding off to sleep. The next thing he remembers he was in the ambulance en route.  He denies any previous similar episodes, lost time, or other episodes concerning for seizure.  He has been feeling "drained" for the past week. Has not been feeling himself, but no more specific symptoms.  Of note, he took a couple of pain medicine pills earlier tonight which he thinks are tramadol.  ROS: A 14 point ROS was performed and is negative except as noted in the HPI.  Past Medical History  Diagnosis Date  . Myocardial infarction   . Concussion     hx of  . Pneumonia   . Arthritis     Family History: No history of seizure or stroke  Social History: Tob: Positive smoking  Exam: Current vital signs: BP 145/76  Resp 20  SpO2 94% Vital signs in last 24 hours: Resp:  [20] 20 (04/03 2220) BP: (145)/(76) 145/76 mmHg (04/03 2220) SpO2:  [94 %] 94 % (04/03 2220)  General: In bed, NAD CV: Air and rhythm Mental Status: Patient is awake, alert, oriented to person, place, month, year, and situation. Immediate and remote memory  are intact. Patient is able to give a clear and coherent history other than the time around the event. No signs of aphasia or neglect Cranial Nerves: II: Visual Fields are full. Pupils are equal, round, and reactive to light.  Discs are difficult to visualize. III,IV, VI: EOMI without ptosis or diploplia.  V: Facial sensation is symmetric to temperature VII: Facial movement is symmetric.  VIII: hearing is intact to voice X: Uvula elevates symmetrically XI: Shoulder shrug is symmetric. XII: tongue is midline without atrophy or fasciculations.  Motor: Tone is normal. Bulk is normal. 5/5 strength was present in all four extremities.  Sensory: Sensation is symmetric to light touch and pin in the arms and legs. Deep Tendon Reflexes: 2+ and symmetric in the biceps and patellae.  Cerebellar: FNF and HKS are intact bilaterally Gait: Not assessed due to acute nature of evaluation and multiple medical monitors in ED setting.   I have reviewed labs in epic and the results pertinent to this consultation are: Elevated WBC 16.2 Unremarkable BMP  I have reviewed the images obtained:CT head - no acute findings.   Impression: 67 yo M with transient episode of left sided weakness and unresponsiveness. It is possible that he had a seizure causign a post ictal state in which the wife found him in which case the left sided weakness would be a todd's paralysis. Also possible would be TIA, though it would be unsual for it to cause this  level of confusion.   Recommendations: 1. HgbA1c, fasting lipid panel 2. MRI, MRA  of the brain without contrast 3. Frequent neuro checks 4. Echocardiogram 5. Carotid dopplers 6. Prophylactic therapy-Antiplatelet med: Aspirin - dose 325mg   7. Risk factor modification 8. Telemetry monitoring 9. EEG 10. If the medication taken earlier tonight was tramadol, would avoid as this can lower seizure threshold.    Ritta Slot, MD [facility] [phone]  If 7pm- 7am, please page neurology on call at [phone].

## 2013-03-16 NOTE — H&P (Signed)
[facility] History and Physical  Luis Salinas WUJ:[id number] DOB: 1946-05-19    PCP:   Phill Myron, NP   Chief Complaint: Unconscious with left upper extremity paresthesia.  HPI: Luis Salinas is a 67 y.o. 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 Patient has history of coronary disease status post 2 MIs, status post 2 cardiac stents placement (2008), prior tobacco use with history of COPD, hyperlipidemia on Crestor, last seen normal at 1800 , found by his wife loss of consciousness, confused, with facial droop and slurred speech. EMS was summoned via 911 call. Wife noted that his left arm was weak and he was not able to move it. She did not witness any seizure activity, tongue biting, or urinary incontinence. The whole episode lasted approximately 30 minutes. Upon arrival to the emergency room, it was noted that he was back to his baseline with fluent speech, alert, and no focal weakness. There has been no lethargy or any evidence of postictal confusion. Further evaluation included a head CT which was unremarkable. Alcohol level was negative. Serology was normal, CBG 122, serum sodium 135. He does have a leukocytosis with a white count of 16,000. Hospitalist was asked to admit patient for possible TIA/CVA workup.  He denied any visual changes, scotomata, flashes of light, or any headache. He did have cervical surgery last year, but there was no complication after the surgery. He did not have any further pain or weakness after his neck surgery. There has been no prior incidence similar to this event.  Rewiew of Systems:  Constitutional: Negative for malaise, fever and chills. No significant weight loss or weight gain Eyes: Negative for eye pain, redness and discharge, diplopia, visual changes, or flashes of light. ENMT: Negative for ear pain, hoarseness, nasal congestion, sinus pressure and sore throat. No headaches; tinnitus, drooling, or problem  swallowing. Cardiovascular: Negative for chest pain, palpitations, diaphoresis, dyspnea and peripheral edema. ; No orthopnea, PND Respiratory: Negative for cough, hemoptysis, wheezing and stridor. No pleuritic chestpain. Gastrointestinal: Negative for nausea, vomiting, diarrhea, constipation, abdominal pain, melena, blood in stool, hematemesis, jaundice and rectal bleeding.    Genitourinary: Negative for frequency, dysuria, incontinence,flank pain and hematuria; Musculoskeletal: Negative for back pain and neck pain. Negative for swelling and trauma.;  Skin: . Negative for pruritus, rash, abrasions, bruising and skin lesion.; ulcerations Neuro: Negative for headache, lightheadedness and neck stiffness. Negative for weakness, altered level of consciousness , altered mental status, extremity weakness, burning feet, involuntary movement, Psych: negative for anxiety, depression, insomnia, tearfulness, panic attacks, hallucinations, paranoia, suicidal or homicidal ideation    Past Medical History  Diagnosis Date  . Myocardial infarction   . Concussion     hx of  . Pneumonia   . Arthritis   . COPD (chronic obstructive pulmonary disease)     Past Surgical History  Procedure Laterality Date  . Cardiac catheterization    . Coronary angioplasty    . Tonsillectomy    . Joint replacement      Left knee replacement  . Rotator cuff repair      left  . Colonoscopy w/ polypectomy    . Anterior cervical decomp/discectomy fusion  05/16/2012    Procedure: ANTERIOR CERVICAL DECOMPRESSION/DISCECTOMY FUSION 2 LEVELS;  Surgeon: Barnett Abu, MD;  Location: [facility];  Service: Neurosurgery;  Laterality: N/A;  Cervical Five-Six, Cervical Six-Seven Anterior Cervical Decompression/Diskectomy Fusion    Medications:  HOME MEDS: Prior to Admission medications   Medication Sig Start Date End  Date Taking? Authorizing Provider  aspirin EC 81 MG tablet Take 81 mg by mouth daily.    Historical Provider, MD   nebivolol (BYSTOLIC) 5 MG tablet Take 5 mg by mouth daily.    Historical Provider, MD  oxyCODONE (OXYCONTIN) 10 MG 12 hr tablet Take 10 mg by mouth 4 (four) times daily as needed. Pain.    Historical Provider, MD  rosuvastatin (CRESTOR) 20 MG tablet Take 20 mg by mouth daily.    Historical Provider, MD     Allergies:  No Known Allergies  Social History:   reports that he has been smoking Cigars.  He does not have any smokeless tobacco history on file. He reports that he uses illicit drugs (Marijuana). He reports that he does not drink alcohol.  Family History: Family History  Problem Relation Age of Onset  . Anesthesia problems Neg Hx   . Hypotension Neg Hx   . Malignant hyperthermia Neg Hx   . Pseudochol deficiency Neg Hx      Physical Exam: Filed Vitals:   03/16/13 2230 03/16/13 2244 03/16/13 2245 03/16/13 2300  BP: 143/74  152/74 148/77  Pulse: 65  64 66  Temp:  97.7 F (36.5 C)    TempSrc:      Resp: 15  21 20   SpO2: 97%  95% 96%   Blood pressure 148/77, pulse 66, temperature 97.7 F (36.5 C), resp. rate 20, SpO2 96.00%.  GEN:  Pleasant patient lying in the stretcher in no acute distress; cooperative with exam. PSYCH:  alert and oriented x4; does not appear anxious or depressed; affect is appropriate. HEENT: Mucous membranes pink and anicteric; PERRLA; EOM intact; no cervical lymphadenopathy nor thyromegaly or carotid bruit; no JVD; There were no stridor. Neck is very supple. Breasts:: Not examined CHEST WALL: No tenderness CHEST: Normal respiration, clear to auscultation bilaterally.  HEART: Regular rate and rhythm.  There are no murmur, rub, or gallops.   BACK: No kyphosis or scoliosis; no CVA tenderness ABDOMEN: soft and non-tender; no masses, no organomegaly, normal abdominal bowel sounds; no pannus; no intertriginous candida. There is no rebound and no distention. Rectal Exam: Not done EXTREMITIES: No bone or joint deformity; age-appropriate arthropathy of the  hands and knees; no edema; no ulcerations.  There is no calf tenderness. Genitalia: not examined PULSES: 2+ and symmetric SKIN: Normal hydration no rash or ulceration CNS: Cranial nerves 2-12 grossly intact no focal lateralizing neurologic deficit.  Speech is fluent; uvula elevated with phonation, facial symmetry and tongue midline. DTR are normal bilaterally, cerebella exam is intact, barbinski is negative and strengths are equaled bilaterally.  No sensory loss. Visual field with no deficit by confrontation method   Labs on Admission:  Basic Metabolic Panel:  Recent Labs Lab 03/16/13 2200 03/16/13 2220  NA 135 138  K 3.6 3.6  CL 101 102  CO2 25  --   GLUCOSE 144* 144*  BUN 13 12  CREATININE 0.99 1.00  CALCIUM 8.9  --    Liver Function Tests:  Recent Labs Lab 03/16/13 2200  AST 10  ALT 6  ALKPHOS 89  BILITOT 0.3  PROT 6.7  ALBUMIN 3.6   No results found for this basename: LIPASE, AMYLASE,  in the last 168 hours No results found for this basename: AMMONIA,  in the last 168 hours CBC:  Recent Labs Lab 03/16/13 2200 03/16/13 2220  WBC 16.2*  --   NEUTROABS 13.3*  --   HGB 15.9 16.0  HCT 44.0 47.0  MCV 82.4  --   PLT 174  --    Cardiac Enzymes:  Recent Labs Lab 03/16/13 2200  TROPONINI <0.30    CBG:  Recent Labs Lab 03/16/13 2244  GLUCAP 132*     Radiological Exams on Admission: Ct Head (brain) Wo Contrast  03/16/2013  *RADIOLOGY REPORT*  Clinical Data: Left-sided weakness  CT HEAD WITHOUT CONTRAST  Technique:  Contiguous axial images were obtained from the base of the skull through the vertex without contrast.  Comparison: None.  Findings: Mild chronic ischemic changes in the periventricular white matter.  Mild global atrophy.  No mass effect, midline shift, or acute intracranial hemorrhage.  Mastoid air cells are clear. Visualized paranasal sinuses are clear.  Old right anterior maxillary sinus fracture.  IMPRESSION: Chronic ischemic changes and  atrophy.  Discussed with [name].   Original Report Authenticated By: Jolaine Click, M.D.    Dg Chest Port 1 View  03/16/2013  *RADIOLOGY REPORT*  Clinical Data: Generalized weakness during a week.  New finding of altered mental status and facial droop.  Left-sided weakness.  PORTABLE CHEST - 1 VIEW  Comparison: 05/16/2012  Findings: Shallow inspiration.  Borderline heart size and pulmonary vascularity are likely normal for technique.  Central interstitial changes and peribronchial thickening suggesting chronic bronchitis. No focal airspace consolidation.  No blunting of costophrenic angles.  No pneumothorax.  Calcified and tortuous aorta. Degenerative changes in the spine.  Surgical changes in the cervical spine.  Old resection or resorption of the distal left clavicle.  No significant change since previous study.  IMPRESSION: Chronic bronchitic changes.  No focal consolidation.   Original Report Authenticated By: Burman Nieves, M.D.     Assessment/Plan Present on Admission:  . Brain TIA . Hyperlipidemia . CAD in native artery . COPD (chronic obstructive pulmonary disease)  PLAN:  I am concerned this may represent TIA/CVA given the history although the differential would also include seizure, complex migraine, or Todd's paralysis. We'll admit him for TIA workup to include MRI/MRA without contrast of the brain along with carotid Doppler and cardiac echo. Since his have been on aspirin, will switch him over to Plavix. She does have vascular disease given prior history of MI's requiring cardiac stents. We'll continue him on his home medication including his statin. He will need an EEG as well. Will admit him to telemetry under [facility]. He is now at his baseline status. Thank you for allowing me to participate in the care of your pleasant patient.  Other plans as per orders.  Code Status: Full code   Isaih Bulger, MD. [facility] Pager [phone] 7pm to  7am.  03/16/2013, 11:33 PM

## 2013-03-16 NOTE — ED Notes (Signed)
Lee MD at bedside

## 2013-03-16 NOTE — ED Provider Notes (Signed)
History     CSN: [id number]  Arrival date & time 03/16/13  2159   First MD Initiated Contact with Patient 03/16/13 2205      Chief Complaint  Patient presents with  . Code Stroke    (Consider location/radiation/quality/duration/timing/severity/associated sxs/prior treatment) HPI Pt last seen normal at 1830. Wife came home to find pt unresponsive and called EMS. Per EMS pt was awake but had flacid paralysis of LUE with r facial droop that resolved en route. Pt states he has not felt well for 1 week but denies fever, chills, CP, SOB, cough, abd pain, N/V/D, or urinary symptoms. Currently complains of no pain.  Past Medical History  Diagnosis Date  . Myocardial infarction   . Concussion     hx of  . Pneumonia   . Arthritis     Past Surgical History  Procedure Laterality Date  . Cardiac catheterization    . Coronary angioplasty    . Tonsillectomy    . Joint replacement      Left knee replacement  . Rotator cuff repair      left  . Colonoscopy w/ polypectomy    . Anterior cervical decomp/discectomy fusion  05/16/2012    Procedure: ANTERIOR CERVICAL DECOMPRESSION/DISCECTOMY FUSION 2 LEVELS;  Surgeon: Barnett Abu, MD;  Location: [facility];  Service: Neurosurgery;  Laterality: N/A;  Cervical Five-Six, Cervical Six-Seven Anterior Cervical Decompression/Diskectomy Fusion    Family History  Problem Relation Age of Onset  . Anesthesia problems Neg Hx   . Hypotension Neg Hx   . Malignant hyperthermia Neg Hx   . Pseudochol deficiency Neg Hx     History  Substance Use Topics  . Smoking status: Current Every Day Smoker -- 4 years    Types: Cigars  . Smokeless tobacco: Not on file  . Alcohol Use: No      Review of Systems  Constitutional: Positive for fatigue. Negative for fever and chills.  HENT: Negative for neck pain.   Eyes: Negative for visual disturbance.  Respiratory: Negative for cough and shortness of breath.   Cardiovascular: Negative for chest pain,  palpitations and leg swelling.  Gastrointestinal: Negative for nausea, vomiting, abdominal pain and diarrhea.  Genitourinary: Negative for dysuria and frequency.  Musculoskeletal: Negative for myalgias and back pain.  Skin: Negative for pallor, rash and wound.  Neurological: Positive for facial asymmetry and weakness. Negative for dizziness, light-headedness, numbness and headaches.  All other systems reviewed and are negative.    Allergies  Review of patient's allergies indicates no known allergies.  Home Medications   Current Outpatient Rx  Name  Route  Sig  Dispense  Refill  . aspirin EC 81 MG tablet   Oral   Take 81 mg by mouth daily.         . nebivolol (BYSTOLIC) 5 MG tablet   Oral   Take 5 mg by mouth daily.         Marland Kitchen oxyCODONE (OXYCONTIN) 10 MG 12 hr tablet   Oral   Take 10 mg by mouth 4 (four) times daily as needed. Pain.         . rosuvastatin (CRESTOR) 20 MG tablet   Oral   Take 20 mg by mouth daily.           BP 145/76  Resp 20  SpO2 94%  Physical Exam  Nursing note and vitals reviewed. Constitutional: He is oriented to person, place, and time. He appears well-developed and well-nourished. No distress.  HENT:  Head: Normocephalic and atraumatic.  Mouth/Throat: Oropharynx is clear and moist.  Eyes: EOM are normal. Pupils are equal, round, and reactive to light.  Neck: Normal range of motion. Neck supple.  No meningismus   Cardiovascular: Normal rate and regular rhythm.   Pulmonary/Chest: Effort normal and breath sounds normal. No respiratory distress. He has no wheezes. He has no rales.  Abdominal: Soft. Bowel sounds are normal. He exhibits no distension and no mass. There is no tenderness. There is no rebound and no guarding.  Musculoskeletal: Normal range of motion. He exhibits no edema and no tenderness.  Neurological: He is alert and oriented to person, place, and time.  CN II-XII intact, 5/5 motor in all ext, sensation intact, finger to  nose intact bl, visual fields intact  Skin: Skin is warm and dry. No rash noted. No erythema.  Psychiatric: He has a normal mood and affect. His behavior is normal.    ED Course  Procedures (including critical care time)  Labs Reviewed  APTT - Abnormal; Notable for the following:    aPTT 48 (*)    All other components within normal limits  CBC - Abnormal; Notable for the following:    WBC 16.2 (*)    MCHC 36.1 (*)    All other components within normal limits  DIFFERENTIAL - Abnormal; Notable for the following:    Neutrophils Relative 83 (*)    Neutro Abs 13.3 (*)    All other components within normal limits  POCT I-STAT, CHEM 8 - Abnormal; Notable for the following:    Glucose, Bld 144 (*)    Calcium, Ion 1.12 (*)    All other components within normal limits  PROTIME-INR  COMPREHENSIVE METABOLIC PANEL  TROPONIN I  URINALYSIS, ROUTINE W REFLEX MICROSCOPIC  ETHANOL  URINE RAPID DRUG SCREEN (HOSP PERFORMED)  GLUCOSE, CAPILLARY   Ct Head (brain) Wo Contrast  03/16/2013  *RADIOLOGY REPORT*  Clinical Data: Left-sided weakness  CT HEAD WITHOUT CONTRAST  Technique:  Contiguous axial images were obtained from the base of the skull through the vertex without contrast.  Comparison: None.  Findings: Mild chronic ischemic changes in the periventricular white matter.  Mild global atrophy.  No mass effect, midline shift, or acute intracranial hemorrhage.  Mastoid air cells are clear. Visualized paranasal sinuses are clear.  Old right anterior maxillary sinus fracture.  IMPRESSION: Chronic ischemic changes and atrophy.  Discussed with [name].   Original Report Authenticated By: Jolaine Click, M.D.      1. Weakness   2. Altered mental status      Date: 03/16/2013  Rate: 63  Rhythm: normal sinus rhythm  QRS Axis: normal  Intervals: normal  ST/T Wave abnormalities: nonspecific T wave changes  Conduction Disutrbances:none  Narrative Interpretation:   Old EKG Reviewed:  unchanged    MDM  Not tPA candidate due to lack of symptoms. Will admit for TIA vs seizure.   Dr Conley Rolls to see and admit      [name], MD 03/16/13 2248

## 2013-03-17 ENCOUNTER — Encounter (HOSPITAL_COMMUNITY): Payer: Self-pay

## 2013-03-17 ENCOUNTER — Inpatient Hospital Stay (HOSPITAL_COMMUNITY): Payer: 59

## 2013-03-17 DIAGNOSIS — I6350 Cerebral infarction due to unspecified occlusion or stenosis of unspecified cerebral artery: Secondary | ICD-10-CM

## 2013-03-17 DIAGNOSIS — E785 Hyperlipidemia, unspecified: Secondary | ICD-10-CM

## 2013-03-17 DIAGNOSIS — R5381 Other malaise: Secondary | ICD-10-CM

## 2013-03-17 DIAGNOSIS — G459 Transient cerebral ischemic attack, unspecified: Secondary | ICD-10-CM

## 2013-03-17 DIAGNOSIS — Z9861 Coronary angioplasty status: Secondary | ICD-10-CM

## 2013-03-17 DIAGNOSIS — I517 Cardiomegaly: Secondary | ICD-10-CM

## 2013-03-17 DIAGNOSIS — R4182 Altered mental status, unspecified: Secondary | ICD-10-CM

## 2013-03-17 DIAGNOSIS — J449 Chronic obstructive pulmonary disease, unspecified: Secondary | ICD-10-CM

## 2013-03-17 LAB — URINALYSIS, ROUTINE W REFLEX MICROSCOPIC
Glucose, UA: NEGATIVE mg/dL
Ketones, ur: NEGATIVE mg/dL
Leukocytes, UA: NEGATIVE
Specific Gravity, Urine: 1.01 (ref 1.005–1.030)
pH: 7 (ref 5.0–8.0)

## 2013-03-17 LAB — RAPID URINE DRUG SCREEN, HOSP PERFORMED
Barbiturates: NOT DETECTED
Benzodiazepines: NOT DETECTED
Cocaine: NOT DETECTED
Opiates: NOT DETECTED

## 2013-03-17 LAB — HEMOGLOBIN A1C
Hgb A1c MFr Bld: 6 % — ABNORMAL HIGH (ref <5.7)
Mean Plasma Glucose: 126 mg/dL — ABNORMAL HIGH (ref <117)

## 2013-03-17 LAB — GLUCOSE, CAPILLARY: Glucose-Capillary: 90 mg/dL (ref 70–99)

## 2013-03-17 LAB — LIPID PANEL
Total CHOL/HDL Ratio: 4 RATIO
VLDL: 12 mg/dL (ref 0–40)

## 2013-03-17 MED ORDER — NEBIVOLOL HCL 5 MG PO TABS
5.0000 mg | ORAL_TABLET | Freq: Every day | ORAL | Status: DC
  Administered 2013-03-17: 5 mg via ORAL
  Filled 2013-03-17: qty 1

## 2013-03-17 MED ORDER — ATORVASTATIN CALCIUM 40 MG PO TABS
40.0000 mg | ORAL_TABLET | Freq: Every day | ORAL | Status: DC
  Filled 2013-03-17: qty 1

## 2013-03-17 MED ORDER — CLOPIDOGREL BISULFATE 75 MG PO TABS: 75.0000 mg | ORAL_TABLET | Freq: Every day | ORAL | Status: DC

## 2013-03-17 MED ORDER — ATORVASTATIN CALCIUM 40 MG PO TABS: 40.0000 mg | ORAL_TABLET | Freq: Every day | ORAL | Status: DC

## 2013-03-17 MED ORDER — ENOXAPARIN SODIUM 40 MG/0.4ML ~~LOC~~ SOLN
40.0000 mg | Freq: Every day | SUBCUTANEOUS | Status: DC
  Administered 2013-03-17: 40 mg via SUBCUTANEOUS
  Filled 2013-03-17: qty 0.4

## 2013-03-17 MED ORDER — CLOPIDOGREL BISULFATE 75 MG PO TABS
75.0000 mg | ORAL_TABLET | Freq: Every day | ORAL | Status: DC
  Administered 2013-03-17: 75 mg via ORAL
  Filled 2013-03-17: qty 1

## 2013-03-17 MED ORDER — SODIUM CHLORIDE 0.9 % IV SOLN: INTRAVENOUS | Status: DC

## 2013-03-17 NOTE — Progress Notes (Signed)
  Echocardiogram 2D Echocardiogram has been performed.  Luis Salinas 03/17/2013, 11:36 AM

## 2013-03-17 NOTE — Discharge Summary (Signed)
Physician Discharge Summary  Luis Salinas LKG:[id number] DOB: 1946/05/10 DOA: 03/16/2013  PCP: Phill Myron, NP  Admit date: 03/16/2013 Discharge date: 03/17/2013  Time spent: 45 minutes  Recommendations for Outpatient Follow-up:  1. Please follow up with cardiology as an outpatient 2. Please follow up with Neurology. Information below.   Discharge Diagnoses:  Principal Problem:   Brain TIA Active Problems:   Hyperlipidemia   CAD in native artery   Presence of stent in coronary artery   COPD (chronic obstructive pulmonary disease)  Discharge Condition: stable  Diet recommendation: heart healthy  History of present illness:  Luis Salinas is a 67 y.o. 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 Patient has history of coronary disease status post 2 MIs, status post 2 cardiac stents placement (2008), prior tobacco use with history of COPD, hyperlipidemia on Crestor, last seen normal at 1800 , found by his wife loss of consciousness, confused, with facial droop and slurred speech. EMS was summoned via 911 call. Wife noted that his left arm was weak and he was not able to move it. She did not witness any seizure activity, tongue biting, or urinary incontinence. The whole episode lasted approximately 30 minutes. Upon arrival to the emergency room, it was noted that he was back to his baseline with fluent speech, alert, and no focal weakness. There has been no lethargy or any evidence of postictal confusion. Further evaluation included a head CT which was unremarkable. Alcohol level was negative. Serology was normal, CBG 122, serum sodium 135. He does have a leukocytosis with a white count of 16,000. Hospitalist was asked to admit patient for possible TIA/CVA workup. He denied any visual changes, scotomata, flashes of light, or any headache. He did have cervical surgery last year, but there was no complication after the surgery. He did not have any further pain or weakness after  his neck surgery. There has been no prior incidence similar to this event.  Hospital Course:   CVA - MRI shows acute infarction affecting right basal ganglia and corona radiata. Lipid panel shows LDL 106, patient on Lipitor 40 mg daily. Was on Aspirin, switched to Plavix for secondary stroke prevention. HBA1C 6.0. TTE shows mildly decreased systolic function, without mention of regional wall motion akinesis. He did have 2 MIs with stent placement thus representing underlying ischemic heart disease. Extensively counseled to follow up with cardiology as an outpatient as he currently does not have a cardiologist. He was seen in the past by [facility], will provide him with information for setting up an appointment. Also advised to follow up with Dr. Pearlean Brownie in stroke clinic in 2 months.  HTN continue Bystolic  COPD - stable  Tobacco abuse - counseled about quitting.  HLD - continue statin  Procedures:  2D echo Study Conclusions  - Left ventricle: The cavity size was normal. Wall thickness was normal. Systolic function was mildly reduced. The estimated ejection fraction was in the range of 45% to 50%. Diffuse hypokinesis. Doppler parameters are consistent with abnormal left ventricular relaxation (grade 1 diastolic dysfunction). - Left atrium: The atrium was mildly dilated.    (i.e. Studies not automatically included, echos, thoracentesis, etc; not x-rays)  Consultations:  Carotid doppler Summary: No significant extracranial carotid artery stenosis demonstrated. Vertebrals are patent with antegrade flow.   Discharge Exam: Filed Vitals:   03/17/13 0131 03/17/13 0207 03/17/13 0616 03/17/13 1427  BP: 156/66 146/69 117/99 146/90  Pulse: 56 66 65 63  Temp: 97.4 F (36.3  C) 98 F (36.7 C) 97.8 F (36.6 C) 97.9 F (36.6 C)  TempSrc: Oral Oral Oral Oral  Resp: 20 18 18 18   SpO2: 94% 92% 93% 96%   General: NAD Cardiovascular: RRR Respiratory: CTA biL  Discharge Instructions     Medication List    STOP taking these medications       aspirin EC 81 MG tablet     rosuvastatin 20 MG tablet  Commonly known as:  CRESTOR  Replaced by:  atorvastatin 40 MG tablet      TAKE these medications       atorvastatin 40 MG tablet  Commonly known as:  LIPITOR  Take 1 tablet (40 mg total) by mouth daily at 6 PM.     clopidogrel 75 MG tablet  Commonly known as:  PLAVIX  Take 1 tablet (75 mg total) by mouth daily with breakfast.     nebivolol 5 MG tablet  Commonly known as:  BYSTOLIC  Take 5 mg by mouth daily.     traMADol 50 MG tablet  Commonly known as:  ULTRAM  Take 50 mg by mouth every 6 (six) hours as needed for pain.     vitamin C 500 MG tablet  Commonly known as:  ASCORBIC ACID  Take 500 mg by mouth daily.       Follow-up Information   Follow up with [facility]). Schedule an appointment as soon as possible for a visit in 1 month.   Contact information:   [address], [address] [place] [address] [phone]      Follow up with Gates Rigg, MD. Schedule an appointment as soon as possible for a visit in 2 months.   Contact information:   [address] [place] [address] [phone]       The results of significant diagnostics from this hospitalization (including imaging, microbiology, ancillary and laboratory) are listed below for reference.    Significant Diagnostic Studies: Ct Head (brain) Wo Contrast  03/16/2013  *RADIOLOGY REPORT*  Clinical Data: Left-sided weakness  CT HEAD WITHOUT CONTRAST  Technique:  Contiguous axial images were obtained from the base of the skull through the vertex without contrast.  Comparison: None.  Findings: Mild chronic ischemic changes in the periventricular white matter.  Mild global atrophy.  No mass effect, midline shift, or acute intracranial hemorrhage.  Mastoid air cells are clear. Visualized paranasal sinuses are clear.  Old right anterior maxillary  sinus fracture.  IMPRESSION: Chronic ischemic changes and atrophy.  Discussed with [name].   Original Report Authenticated By: Jolaine Click, M.D.    Mri Brain Without Contrast  03/17/2013  *RADIOLOGY REPORT*  Clinical Data:  Loss of consciousness.  Upper extremity paresthesia.  History of head trauma.  MRI HEAD WITHOUT CONTRAST MRA HEAD WITHOUT CONTRAST  Technique:  Multiplanar, multiecho pulse sequences of the brain and surrounding structures were obtained without intravenous contrast. Angiographic images of the head were obtained using MRA technique without contrast.  Comparison:  Head CT 03/16/2013  MRI HEAD  Findings:  There is artifact related to some metallic material imbedded within the right ear.  Diffusion imaging shows the area of acute infarction affecting the right basal ganglia and corona radiata.  There is mild swelling but no evidence of hemorrhage or mass effect.  Elsewhere, there are chronic small vessel changes affecting the cerebral hemispheric white matter.  No large vessel territory infarction.  No mass lesion, hydrocephalus or extra-axial collection.  No  pituitary mass.  No fluid in the sinuses or middle ears.  There is fluid within the mastoid air cells on the left.  IMPRESSION: Acute infarction affecting the right basal ganglia and corona radiata.  No evidence of hemorrhage or mass effect.  Chronic small vessel changes affecting the cerebral hemispheric white matter.  Fluid in the mastoid air cells on the right.  MRA HEAD  Findings: Both internal carotid arteries are widely patent into the brain.  The anterior and middle cerebral vessels are patent without proximal stenosis, aneurysm or vascular malformation.  Both vertebral arteries are patent to the basilar with the right being dominant.  No basilar stenosis.  Posterior circulation branch vessels are patent.  IMPRESSION: Negative intracranial MR angiography of the large and medium-sized vessels.   Original Report Authenticated By:  Paulina Fusi, M.D.    Dg Chest Port 1 View  03/16/2013  *RADIOLOGY REPORT*  Clinical Data: Generalized weakness during a week.  New finding of altered mental status and facial droop.  Left-sided weakness.  PORTABLE CHEST - 1 VIEW  Comparison: 05/16/2012  Findings: Shallow inspiration.  Borderline heart size and pulmonary vascularity are likely normal for technique.  Central interstitial changes and peribronchial thickening suggesting chronic bronchitis. No focal airspace consolidation.  No blunting of costophrenic angles.  No pneumothorax.  Calcified and tortuous aorta. Degenerative changes in the spine.  Surgical changes in the cervical spine.  Old resection or resorption of the distal left clavicle.  No significant change since previous study.  IMPRESSION: Chronic bronchitic changes.  No focal consolidation.   Original Report Authenticated By: Burman Nieves, M.D.    Mr Mra Head/brain Wo Cm  03/17/2013  *RADIOLOGY REPORT*  Clinical Data:  Loss of consciousness.  Upper extremity paresthesia.  History of head trauma.  MRI HEAD WITHOUT CONTRAST MRA HEAD WITHOUT CONTRAST  Technique:  Multiplanar, multiecho pulse sequences of the brain and surrounding structures were obtained without intravenous contrast. Angiographic images of the head were obtained using MRA technique without contrast.  Comparison:  Head CT 03/16/2013  MRI HEAD  Findings:  There is artifact related to some metallic material imbedded within the right ear.  Diffusion imaging shows the area of acute infarction affecting the right basal ganglia and corona radiata.  There is mild swelling but no evidence of hemorrhage or mass effect.  Elsewhere, there are chronic small vessel changes affecting the cerebral hemispheric white matter.  No large vessel territory infarction.  No mass lesion, hydrocephalus or extra-axial collection.  No pituitary mass.  No fluid in the sinuses or middle ears.  There is fluid within the mastoid air cells on the left.   IMPRESSION: Acute infarction affecting the right basal ganglia and corona radiata.  No evidence of hemorrhage or mass effect.  Chronic small vessel changes affecting the cerebral hemispheric white matter.  Fluid in the mastoid air cells on the right.  MRA HEAD  Findings: Both internal carotid arteries are widely patent into the brain.  The anterior and middle cerebral vessels are patent without proximal stenosis, aneurysm or vascular malformation.  Both vertebral arteries are patent to the basilar with the right being dominant.  No basilar stenosis.  Posterior circulation branch vessels are patent.  IMPRESSION: Negative intracranial MR angiography of the large and medium-sized vessels.   Original Report Authenticated By: Paulina Fusi, M.D.    Labs: Basic Metabolic Panel:  Recent Labs Lab 03/16/13 2200 03/16/13 2220  NA 135 138  K 3.6 3.6  CL 101 102  CO2  25  --   GLUCOSE 144* 144*  BUN 13 12  CREATININE 0.99 1.00  CALCIUM 8.9  --    Liver Function Tests:  Recent Labs Lab 03/16/13 2200  AST 10  ALT 6  ALKPHOS 89  BILITOT 0.3  PROT 6.7  ALBUMIN 3.6   CBC:  Recent Labs Lab 03/16/13 2200 03/16/13 2220  WBC 16.2*  --   NEUTROABS 13.3*  --   HGB 15.9 16.0  HCT 44.0 47.0  MCV 82.4  --   PLT 174  --    Cardiac Enzymes:  Recent Labs Lab 03/16/13 2200  TROPONINI <0.30   CBG:  Recent Labs Lab 03/16/13 2244 03/17/13 1144  GLUCAP 132* 90   Signed:  GHERGHE, COSTIN  [facility] 03/17/2013, 3:50 PM

## 2013-03-17 NOTE — Evaluation (Signed)
Physical Therapy Evaluation Patient Details Name: Luis Salinas MRN: [id number] DOB: 1946/08/04 Today's Date: 03/17/2013 Time: 1444-1500 PT Time Calculation (min): 16 min  PT Assessment / Plan / Recommendation Clinical Impression  Pt s/p episode of facial weakness/drooping and difficulty speaking. MRI showed Rt basal ganglia and corona radiata infarct. Pt at baseline for mobility, balance, and strength with no further PT needs. Pt educated on signs and symptoms of CVA and when to call.    PT Assessment  Patent does not need any further PT services    Follow Up Recommendations  No PT follow up                Equipment Recommendations  None recommended by PT                  Mobility  Bed Mobility Bed Mobility: Supine to Sit;Sitting - Scoot to Edge of Bed Supine to Sit: 7: Independent Sitting - Scoot to Edge of Bed: 7: Independent Details for Bed Mobility Assistance: including manipulation of linens Transfers Transfers: Sit to Stand;Stand to Sit Sit to Stand: 7: Independent Stand to Sit: 7: Independent Ambulation/Gait Ambulation/Gait Assistance: 7: Independent Ambulation Distance (Feet): 450 Feet Assistive device: None Ambulation/Gait Assistance Details: see DGI Gait Pattern: Within Functional Limits Stairs: Yes Stairs Assistance: 7: Independent Stair Management Technique: No rails;Forwards Number of Stairs: 2 Modified Rankin (Stroke Patients Only) Pre-Morbid Rankin Score: No symptoms Modified Rankin: No symptoms      Visit Information  Last PT Received On: 03/17/13 Assistance Needed: +1    Subjective Data  Subjective: I feel fine. I don't remember her trying to wake me up. 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: No difficulties Dominant Hand: Right    Cognition  Cognition Overall Cognitive Status: Appears within functional limits for tasks assessed/performed Arousal/Alertness: Awake/alert Orientation Level: Appears intact for tasks assessed Behavior During Session: [facility] for tasks performed    Extremity/Trunk Assessment Right Lower Extremity Assessment RLE ROM/Strength/Tone: [facility] for tasks assessed RLE Sensation:  (pt denied changes) RLE Coordination: WFL - gross motor Left Lower Extremity Assessment LLE ROM/Strength/Tone: WFL for tasks assessed LLE Sensation:  (pt denied changes) LLE Coordination: WFL - gross motor Trunk Assessment Trunk Assessment: Normal   Balance Standardized Balance Assessment Standardized Balance Assessment: Berg Balance Test;Dynamic Gait Index Berg Balance Test Sit to Stand: Able to stand without using hands and stabilize independently Standing Unsupported: Able to stand safely 2 minutes Sitting with Back Unsupported but Feet Supported on Floor or Stool: Able to sit safely and securely 2 minutes Stand to Sit: Sits safely with minimal use of hands Transfers: Able to transfer safely, minor use of hands Standing Unsupported with Eyes Closed: Able to stand 10 seconds safely Standing Ubsupported with Feet Together: Able to place feet together independently and stand 1 minute safely From Standing, Reach Forward with Outstretched Arm: Can reach confidently >25 cm (10") From Standing Position, Pick up Object from Floor: Able to pick up shoe safely and easily From Standing Position, Turn to Look Behind Over each Shoulder: Looks behind from both sides and weight shifts well Turn 360 Degrees: Able to turn 360 degrees  safely in 4 seconds or less Standing Unsupported,  Alternately Place Feet on Step/Stool: Able to stand independently and safely and complete 8 steps in 20 seconds Standing Unsupported, One Foot in Front: Able to plae foot ahead of the other independently and hold 30 seconds Standing on One Leg: Able to lift leg independently and hold > 10 seconds Total Score: 55 Dynamic Gait Index Level Surface: Normal Change in Gait Speed: Normal Gait with Horizontal Head Turns: Normal Gait with Vertical Head Turns: Normal Gait and Pivot Turn: Normal Step Over Obstacle: Normal Step Around Obstacles: Normal Steps: Normal Total Score: 24  End of Session PT - End of Session Equipment Utilized During Treatment: Gait belt Activity Tolerance: Patient tolerated treatment well Patient left: in chair;with family/visitor present Nurse Communication: Mobility status;Other (comment) (d/c from PT)  GP     Krisann Mckenna 03/17/2013, 3:15 PM   03/17/2013 Veda Canning, PT Pager: [phone]

## 2013-03-17 NOTE — Progress Notes (Signed)
EEG  Completed; results pending. 

## 2013-03-17 NOTE — Progress Notes (Signed)
Bilateral carotid artery duplex:  No evidence of hemodynamically significant internal carotid artery stenosis.   Vertebral artery flow is antegrade.     

## 2013-03-17 NOTE — ED Notes (Signed)
Patient transported to 4N room 1

## 2013-03-17 NOTE — Progress Notes (Signed)
Stroke Team Progress Note  HISTORY Luis Salinas is a 67 y.o. male with a history of myocardial infarction who presents with an episode of decreased responsiveness and left-sided weakness. His wife got to him on the phone around 6:30 PM, and then when she returned home around 9 PM she found him sitting in front of the television appearing to be asleep. When she tried to awaken him, he was initially unresponsive, but subsequently became more responsive. When EMS arrived, he was able follow commands with good strength on the right side, but unable to give good strength on the left. His wife also notes that his face was "drawing up." She also noticed that he was licking his lips. He states that he remembers watching TV, feeling sleepy, and then nodding off to sleep. The next thing he remembers he was in the ambulance en route.  He denies any previous similar episodes, lost time, or other episodes concerning for seizure.  He has been feeling "drained" for the past week. Has not been feeling himself, but no more specific symptoms.  Of note, he took a couple of pain medicine pills earlier tonight which he thinks are tramadol   Patient was not a TPA candidate secondary to unclear history and stroke not suspected as primary diagnosis. He was admitted to the neuro floor for further evaluation and treatment.  SUBJECTIVE His wife is at the bedside.  Overall he feels his condition is completely resolved. He does not re,ember what happened and states he woke up in the ambulance.  OBJECTIVE Most recent Vital Signs: Filed Vitals:   03/16/13 2300 03/17/13 0131 03/17/13 0207 03/17/13 0616  BP: 148/77 156/66 146/69 117/99  Pulse: 66 56 66 65  Temp:  97.4 F (36.3 C) 98 F (36.7 C) 97.8 F (36.6 C)  TempSrc:  Oral Oral Oral  Resp: 20 20 18 18   SpO2: 96% 94% 92% 93%   CBG (last 3)   Recent Labs  03/16/13 2244  GLUCAP 132*    IV Fluid Intake:   . sodium chloride      MEDICATIONS  . sodium  chloride   Intravenous STAT  . atorvastatin  40 mg Oral q1800  . clopidogrel  75 mg Oral Q breakfast  . enoxaparin (LOVENOX) injection  40 mg Subcutaneous Daily  . nebivolol  5 mg Oral Daily   PRN:    Diet:  Cardiac   Activity:  Bedrest  DVT Prophylaxis: lovenox  CLINICALLY SIGNIFICANT STUDIES Basic Metabolic Panel:  Recent Labs Lab 03/16/13 2200 03/16/13 2220  NA 135 138  K 3.6 3.6  CL 101 102  CO2 25  --   GLUCOSE 144* 144*  BUN 13 12  CREATININE 0.99 1.00  CALCIUM 8.9  --    Liver Function Tests:  Recent Labs Lab 03/16/13 2200  AST 10  ALT 6  ALKPHOS 89  BILITOT 0.3  PROT 6.7  ALBUMIN 3.6   CBC:  Recent Labs Lab 03/16/13 2200 03/16/13 2220  WBC 16.2*  --   NEUTROABS 13.3*  --   HGB 15.9 16.0  HCT 44.0 47.0  MCV 82.4  --   PLT 174  --    Coagulation:  Recent Labs Lab 03/16/13 2200  LABPROT 14.1  INR 1.10   Cardiac Enzymes:  Recent Labs Lab 03/16/13 2200  TROPONINI <0.30   Urinalysis:  Recent Labs Lab 03/17/13 0619  COLORURINE YELLOW  LABSPEC 1.010  PHURINE 7.0  GLUCOSEU NEGATIVE  HGBUR NEGATIVE  BILIRUBINUR NEGATIVE  KETONESUR NEGATIVE  PROTEINUR NEGATIVE  UROBILINOGEN 0.2  NITRITE NEGATIVE  LEUKOCYTESUR NEGATIVE   Lipid Panel    Component Value Date/Time   CHOL 158 03/17/2013 0415   TRIG 62 03/17/2013 0415   HDL 40 03/17/2013 0415   CHOLHDL 4.0 03/17/2013 0415   VLDL 12 03/17/2013 0415   LDLCALC 106* 03/17/2013 0415   HgbA1C  No results found for this basename: HGBA1C    Urine Drug Screen:     Component Value Date/Time   LABOPIA NONE DETECTED 03/17/2013 0619   COCAINSCRNUR NONE DETECTED 03/17/2013 0619   LABBENZ NONE DETECTED 03/17/2013 0619   AMPHETMU NONE DETECTED 03/17/2013 0619   THCU POSITIVE* 03/17/2013 0619   LABBARB NONE DETECTED 03/17/2013 0619    Alcohol Level:  Recent Labs Lab 03/16/13 2228  ETH <11    Ct Head (brain) Wo Contrast  03/16/2013  *RADIOLOGY REPORT*  Clinical Data: Left-sided weakness  CT HEAD WITHOUT  CONTRAST  Technique:  Contiguous axial images were obtained from the base of the skull through the vertex without contrast.  Comparison: None.  Findings: Mild chronic ischemic changes in the periventricular white matter.  Mild global atrophy.  No mass effect, midline shift, or acute intracranial hemorrhage.  Mastoid air cells are clear. Visualized paranasal sinuses are clear.  Old right anterior maxillary sinus fracture.  IMPRESSION: Chronic ischemic changes and atrophy.  Discussed with [name].   Original Report Authenticated By: Jolaine Click, M.D.    Mri Brain Without Contrast  03/17/2013  *RADIOLOGY REPORT*  Clinical Data:  Loss of consciousness.  Upper extremity paresthesia.  History of head trauma.  MRI HEAD WITHOUT CONTRAST MRA HEAD WITHOUT CONTRAST  Technique:  Multiplanar, multiecho pulse sequences of the brain and surrounding structures were obtained without intravenous contrast. Angiographic images of the head were obtained using MRA technique without contrast.  Comparison:  Head CT 03/16/2013  MRI HEAD  Findings:  There is artifact related to some metallic material imbedded within the right ear.  Diffusion imaging shows the area of acute infarction affecting the right basal ganglia and corona radiata.  There is mild swelling but no evidence of hemorrhage or mass effect.  Elsewhere, there are chronic small vessel changes affecting the cerebral hemispheric white matter.  No large vessel territory infarction.  No mass lesion, hydrocephalus or extra-axial collection.  No pituitary mass.  No fluid in the sinuses or middle ears.  There is fluid within the mastoid air cells on the left.  IMPRESSION: Acute infarction affecting the right basal ganglia and corona radiata.  No evidence of hemorrhage or mass effect.  Chronic small vessel changes affecting the cerebral hemispheric white matter.  Fluid in the mastoid air cells on the right.  MRA HEAD  Findings: Both internal carotid arteries are widely patent  into the brain.  The anterior and middle cerebral vessels are patent without proximal stenosis, aneurysm or vascular malformation.  Both vertebral arteries are patent to the basilar with the right being dominant.  No basilar stenosis.  Posterior circulation branch vessels are patent.  IMPRESSION: Negative intracranial MR angiography of the large and medium-sized vessels.   Original Report Authenticated By: Paulina Fusi, M.D.    Dg Chest Port 1 View  03/16/2013  *RADIOLOGY REPORT*  Clinical Data: Generalized weakness during a week.  New finding of altered mental status and facial droop.  Left-sided weakness.  PORTABLE CHEST - 1 VIEW  Comparison: 05/16/2012  Findings: Shallow inspiration.  Borderline heart size and pulmonary vascularity are likely normal  for technique.  Central interstitial changes and peribronchial thickening suggesting chronic bronchitis. No focal airspace consolidation.  No blunting of costophrenic angles.  No pneumothorax.  Calcified and tortuous aorta. Degenerative changes in the spine.  Surgical changes in the cervical spine.  Old resection or resorption of the distal left clavicle.  No significant change since previous study.  IMPRESSION: Chronic bronchitic changes.  No focal consolidation.   Original Report Authenticated By: Burman Nieves, M.D.    Mr Mra Head/brain Wo Cm  03/17/2013  *RADIOLOGY REPORT*  Clinical Data:  Loss of consciousness.  Upper extremity paresthesia.  History of head trauma.  MRI HEAD WITHOUT CONTRAST MRA HEAD WITHOUT CONTRAST  Technique:  Multiplanar, multiecho pulse sequences of the brain and surrounding structures were obtained without intravenous contrast. Angiographic images of the head were obtained using MRA technique without contrast.  Comparison:  Head CT 03/16/2013  MRI HEAD  Findings:  There is artifact related to some metallic material imbedded within the right ear.  Diffusion imaging shows the area of acute infarction affecting the right basal ganglia and  corona radiata.  There is mild swelling but no evidence of hemorrhage or mass effect.  Elsewhere, there are chronic small vessel changes affecting the cerebral hemispheric white matter.  No large vessel territory infarction.  No mass lesion, hydrocephalus or extra-axial collection.  No pituitary mass.  No fluid in the sinuses or middle ears.  There is fluid within the mastoid air cells on the left.  IMPRESSION: Acute infarction affecting the right basal ganglia and corona radiata.  No evidence of hemorrhage or mass effect.  Chronic small vessel changes affecting the cerebral hemispheric white matter.  Fluid in the mastoid air cells on the right.  MRA HEAD  Findings: Both internal carotid arteries are widely patent into the brain.  The anterior and middle cerebral vessels are patent without proximal stenosis, aneurysm or vascular malformation.  Both vertebral arteries are patent to the basilar with the right being dominant.  No basilar stenosis.  Posterior circulation branch vessels are patent.  IMPRESSION: Negative intracranial MR angiography of the large and medium-sized vessels.   Original Report Authenticated By: Paulina Fusi, M.D.        2D Echocardiogram  pending  Carotid Doppler  pending  CXR    EKG LEFT ANTERIOR FASCICULAR BLOCK ~ axis(240,-40), init forces inf CONSIDER RIGHT VENTRICULAR HYPERTROPHY Therapy Recommendations pending  Physical Exam   Pleasant middle aged caucasian male not in distress.Awake alert. Afebrile. Head is nontraumatic. Neck is supple without bruit. Hearing is normal. Cardiac exam no murmur or gallop. Lungs are clear to auscultation. Distal pulses are well felt. Neurological exam ; Awake  Alert oriented x 3. Normal speech and language.eye movements full without nystagmus.fundi were not visualized. Vision acuity and fields appear normal. Hearing is normal. Palatal moments are normal. Face symmetric. Tongue midline. Normal strength, tone, reflexes and coordination. Normal  sensation. Gait deferred.  ASSESSMENT Mr. Luis Salinas is a 67 y.o. male presenting with transient episode of left sided weakness and unresponsiveness.  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 Infarct felt to be thrombotic secondary to small vessel disease.  On aspirin 325 mg orally every day prior to admission. Now on clopidogrel 75 mg orally every day for secondary stroke prevention. Patient with no residual deficits. Work up underway. Vascular risk factors of Smoking, marijuana use, Hyperlipidimia, CAD   Hospital day # 1  TREATMENT/PLAN  Continue clopidogrel and Add statin  .for secondary stroke  prevention.  Patient counselled to quit smoking and marijuana use.  D/w patient, wife and Dr Rolly Salter, MD 03/17/2013 10:51 AM

## 2013-03-17 NOTE — Progress Notes (Signed)
NEURO HOSPITALIST PROGRESS NOTE   SUBJECTIVE:                                                                                                                        Feels back to baseline. No complaints.   OBJECTIVE:                                                                                                                           Vital signs in last 24 hours: Temp:  [97.4 F (36.3 C)-98 F (36.7 C)] 97.8 F (36.6 C) (04/04 0616) Pulse Rate:  [56-66] 65 (04/04 0616) Resp:  [15-21] 18 (04/04 0616) BP: (117-156)/(66-99) 117/99 mmHg (04/04 0616) SpO2:  [92 %-97 %] 93 % (04/04 0616)  Intake/Output from previous day: 04/03 0701 - 04/04 0700 In: 958.8 [P.O.:440; I.V.:518.8] Out: 1100 [Urine:1100] Intake/Output this shift: Total I/O In: 240 [P.O.:240] Out: -  Nutritional status: Cardiac  Past Medical History  Diagnosis Date  . Myocardial infarction   . Concussion     hx of  . Pneumonia   . Arthritis   . COPD (chronic obstructive pulmonary disease)     Neurologic Exam:  Mental Status: Alert, oriented, thought content appropriate.  Speech fluent without evidence of aphasia.  Able to follow 3 step commands without difficulty. Cranial Nerves: II: Discs flat bilaterally; Visual fields grossly normal, pupils equal, round, reactive to light and accommodation III,IV, VI: ptosis not present, extra-ocular motions intact bilaterally V,VII: smile symmetric, facial light touch sensation normal bilaterally VIII: hearing normal bilaterally IX,X: gag reflex present XI: bilateral shoulder shrug XII: midline tongue extension Motor: Right : Upper extremity   5/5    Left:     Upper extremity   5/5  Lower extremity   5/5     Lower extremity   5/5 Tone and bulk:normal tone throughout; no atrophy noted Sensory: Pinprick and light touch intact throughout, bilaterally Deep Tendon Reflexes: 2+ and symmetric throughout Plantars: Right:  downgoing   Left: downgoing Cerebellar: normal finger-to-nose,  normal heel-to-shin test CV: pulses palpable throughout    Lab Results: Lab Results  Component Value Date/Time   CHOL 158 03/17/2013  4:15 AM   Lipid Panel  Recent Labs  03/17/13 0415  CHOL 158  TRIG 62  HDL 40  CHOLHDL 4.0  VLDL 12  LDLCALC 213*    Studies/Results: Ct Head (brain) Wo Contrast  03/16/2013  *RADIOLOGY REPORT*  Clinical Data: Left-sided weakness  CT HEAD WITHOUT CONTRAST  Technique:  Contiguous axial images were obtained from the base of the skull through the vertex without contrast.  Comparison: None.  Findings: Mild chronic ischemic changes in the periventricular white matter.  Mild global atrophy.  No mass effect, midline shift, or acute intracranial hemorrhage.  Mastoid air cells are clear. Visualized paranasal sinuses are clear.  Old right anterior maxillary sinus fracture.  IMPRESSION: Chronic ischemic changes and atrophy.  Discussed with [name].   Original Report Authenticated By: Jolaine Click, M.D.    Mri Brain Without Contrast  03/17/2013  *RADIOLOGY REPORT*  Clinical Data:  Loss of consciousness.  Upper extremity paresthesia.  History of head trauma.  MRI HEAD WITHOUT CONTRAST MRA HEAD WITHOUT CONTRAST  Technique:  Multiplanar, multiecho pulse sequences of the brain and surrounding structures were obtained without intravenous contrast. Angiographic images of the head were obtained using MRA technique without contrast.  Comparison:  Head CT 03/16/2013  MRI HEAD  Findings:  There is artifact related to some metallic material imbedded within the right ear.  Diffusion imaging shows the area of acute infarction affecting the right basal ganglia and corona radiata.  There is mild swelling but no evidence of hemorrhage or mass effect.  Elsewhere, there are chronic small vessel changes affecting the cerebral hemispheric white matter.  No large vessel territory infarction.  No mass lesion, hydrocephalus  or extra-axial collection.  No pituitary mass.  No fluid in the sinuses or middle ears.  There is fluid within the mastoid air cells on the left.  IMPRESSION: Acute infarction affecting the right basal ganglia and corona radiata.  No evidence of hemorrhage or mass effect.  Chronic small vessel changes affecting the cerebral hemispheric white matter.  Fluid in the mastoid air cells on the right.  MRA HEAD  Findings: Both internal carotid arteries are widely patent into the brain.  The anterior and middle cerebral vessels are patent without proximal stenosis, aneurysm or vascular malformation.  Both vertebral arteries are patent to the basilar with the right being dominant.  No basilar stenosis.  Posterior circulation branch vessels are patent.  IMPRESSION: Negative intracranial MR angiography of the large and medium-sized vessels.   Original Report Authenticated By: Paulina Fusi, M.D.    Dg Chest Port 1 View  03/16/2013  *RADIOLOGY REPORT*  Clinical Data: Generalized weakness during a week.  New finding of altered mental status and facial droop.  Left-sided weakness.  PORTABLE CHEST - 1 VIEW  Comparison: 05/16/2012  Findings: Shallow inspiration.  Borderline heart size and pulmonary vascularity are likely normal for technique.  Central interstitial changes and peribronchial thickening suggesting chronic bronchitis. No focal airspace consolidation.  No blunting of costophrenic angles.  No pneumothorax.  Calcified and tortuous aorta. Degenerative changes in the spine.  Surgical changes in the cervical spine.  Old resection or resorption of the distal left clavicle.  No significant change since previous study.  IMPRESSION: Chronic bronchitic changes.  No focal consolidation.   Original Report Authenticated By: Burman Nieves, M.D.    Mr Mra Head/brain Wo Cm  03/17/2013  *RADIOLOGY REPORT*  Clinical Data:  Loss of consciousness.  Upper extremity paresthesia.  History of head trauma.  MRI HEAD WITHOUT CONTRAST MRA  HEAD WITHOUT CONTRAST  Technique:  Multiplanar, multiecho pulse  sequences of the brain and surrounding structures were obtained without intravenous contrast. Angiographic images of the head were obtained using MRA technique without contrast.  Comparison:  Head CT 03/16/2013  MRI HEAD  Findings:  There is artifact related to some metallic material imbedded within the right ear.  Diffusion imaging shows the area of acute infarction affecting the right basal ganglia and corona radiata.  There is mild swelling but no evidence of hemorrhage or mass effect.  Elsewhere, there are chronic small vessel changes affecting the cerebral hemispheric white matter.  No large vessel territory infarction.  No mass lesion, hydrocephalus or extra-axial collection.  No pituitary mass.  No fluid in the sinuses or middle ears.  There is fluid within the mastoid air cells on the left.  IMPRESSION: Acute infarction affecting the right basal ganglia and corona radiata.  No evidence of hemorrhage or mass effect.  Chronic small vessel changes affecting the cerebral hemispheric white matter.  Fluid in the mastoid air cells on the right.  MRA HEAD  Findings: Both internal carotid arteries are widely patent into the brain.  The anterior and middle cerebral vessels are patent without proximal stenosis, aneurysm or vascular malformation.  Both vertebral arteries are patent to the basilar with the right being dominant.  No basilar stenosis.  Posterior circulation branch vessels are patent.  IMPRESSION: Negative intracranial MR angiography of the large and medium-sized vessels.   Original Report Authenticated By: Paulina Fusi, M.D.     MEDICATIONS                                                                                                                        Scheduled: . sodium chloride   Intravenous STAT  . atorvastatin  40 mg Oral q1800  . clopidogrel  75 mg Oral Q breakfast  . enoxaparin (LOVENOX) injection  40 mg Subcutaneous Daily   . nebivolol  5 mg Oral Daily    ASSESSMENT/PLAN:                                                                                                             Acute right BG and Corona radiata infarct. Patient presently back to his baseline. Patient was on ASA prior to admission and changed to Plavix while in hospital.  He admits to smoking in past and continues to smoke a cigar. LDL was slightly elevated at 106 and has been started on Statin since hospitalized.  A1c and EEG pending.   Recommend: 1) Continue Plavix 2) Agree with Statin daily  to get LDL <100 3) cessation of smoking  4) Risk factor control (HTN, LDL, BG)  Stroke team to follow in AM.    Assessment and plan discussed with with attending physician and they are in agreement.    Felicie Morn PA-C [facility] [phone]  03/17/2013, 9:24 AM

## 2013-03-17 NOTE — Progress Notes (Signed)
Utilization review completed. Sukhraj Esquivias, RN, BSN. 

## 2013-03-17 NOTE — Progress Notes (Signed)
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 Patient has history of coronary disease status post 2 MIs, status post 2 cardiac stents placement (2008), prior tobacco use with history of COPD, hyperlipidemia on Crestor, last seen normal at 1800 , found by his wife loss of consciousness, confused, with facial droop and slurred speech. EMS was summoned via 911 call. Wife noted that his left arm was weak and he was not able to move it. She did not witness any seizure activity, tongue biting, or urinary incontinence. The whole episode lasted approximately 30 minutes. Upon arrival to the emergency room, it was noted that he was back to his baseline with fluent speech, alert, and no focal weakness.   Assessment/Plan:  CVA - MRI shows acute infarction affecting right basal ganglia and corona radiata - lipid panel shows LDL 106, patient on Lipitor 40 mg daily - was on Aspirin, switched to Plavix  - neurology is following, appreciate input - HBA1C pending  HTN - continue Bystolic  COPD - stable  Tobacco abuse - counseled about quitting.   HLD  - continue statin  DVT prophylaxis - Lovenox  Code Status: Full Family Communication: none  Disposition Plan: home 1-2 days   Consultants:  Neurology  Procedures:  2D echo pending  Carotid doppler pending  Antibiotics:  none  HPI/Subjective: - no complaints this morning, back to baseline  Objective: Filed Vitals:   03/16/13 2300 03/17/13 0131 03/17/13 0207 03/17/13 0616  BP: 148/77 156/66 146/69 117/99  Pulse: 66 56 66 65  Temp:  97.4 F (36.3 C) 98 F (36.7 C) 97.8 F (36.6 C)  TempSrc:  Oral Oral Oral  Resp: 20 20 18 18   SpO2: 96% 94% 92% 93%    Intake/Output Summary (Last 24 hours) at 03/17/13 1055 Last data filed at 03/17/13 0753  Gross per  24 hour  Intake 1198.75 ml  Output   1100 ml  Net  98.75 ml    Exam:   General:  NAD  Cardiovascular: regular rate and rhythm, without MRG  Respiratory: good air movement, clear to auscultation throughout, no wheezing, ronchi or rales  Abdomen: soft, not tender to palpation, positive bowel sounds  MSK: no peripheral edema  Neuro: CN 2-12 grossly intact, MS 5/5 in all 4  Data Reviewed: Basic Metabolic Panel:  Recent Labs Lab 03/16/13 2200 03/16/13 2220  NA 135 138  K 3.6 3.6  CL 101 102  CO2 25  --   GLUCOSE 144* 144*  BUN 13 12  CREATININE 0.99 1.00  CALCIUM 8.9  --    Liver Function Tests:  Recent Labs Lab 03/16/13 2200  AST 10  ALT 6  ALKPHOS 89  BILITOT 0.3  PROT 6.7  ALBUMIN 3.6   CBC:  Recent Labs Lab 03/16/13 2200 03/16/13 2220  WBC 16.2*  --   NEUTROABS 13.3*  --   HGB 15.9 16.0  HCT 44.0 47.0  MCV 82.4  --   PLT 174  --    Cardiac Enzymes:  Recent Labs Lab 03/16/13 2200  TROPONINI <0.30   CBG:  Recent Labs Lab 03/16/13 2244  GLUCAP 132*   Studies: Ct Head (brain) Wo Contrast  03/16/2013  *RADIOLOGY REPORT*  Clinical Data: Left-sided weakness  CT HEAD WITHOUT CONTRAST  Technique:  Contiguous axial images were obtained from the base  of the skull through the vertex without contrast.  Comparison: None.  Findings: Mild chronic ischemic changes in the periventricular white matter.  Mild global atrophy.  No mass effect, midline shift, or acute intracranial hemorrhage.  Mastoid air cells are clear. Visualized paranasal sinuses are clear.  Old right anterior maxillary sinus fracture.  IMPRESSION: Chronic ischemic changes and atrophy.  Discussed with [name].   Original Report Authenticated By: Jolaine Click, M.D.    Mri Brain Without Contrast  03/17/2013  *RADIOLOGY REPORT*  Clinical Data:  Loss of consciousness.  Upper extremity paresthesia.  History of head trauma.  MRI HEAD WITHOUT CONTRAST MRA HEAD WITHOUT CONTRAST  Technique:   Multiplanar, multiecho pulse sequences of the brain and surrounding structures were obtained without intravenous contrast. Angiographic images of the head were obtained using MRA technique without contrast.  Comparison:  Head CT 03/16/2013  MRI HEAD  Findings:  There is artifact related to some metallic material imbedded within the right ear.  Diffusion imaging shows the area of acute infarction affecting the right basal ganglia and corona radiata.  There is mild swelling but no evidence of hemorrhage or mass effect.  Elsewhere, there are chronic small vessel changes affecting the cerebral hemispheric white matter.  No large vessel territory infarction.  No mass lesion, hydrocephalus or extra-axial collection.  No pituitary mass.  No fluid in the sinuses or middle ears.  There is fluid within the mastoid air cells on the left.  IMPRESSION: Acute infarction affecting the right basal ganglia and corona radiata.  No evidence of hemorrhage or mass effect.  Chronic small vessel changes affecting the cerebral hemispheric white matter.  Fluid in the mastoid air cells on the right.  MRA HEAD  Findings: Both internal carotid arteries are widely patent into the brain.  The anterior and middle cerebral vessels are patent without proximal stenosis, aneurysm or vascular malformation.  Both vertebral arteries are patent to the basilar with the right being dominant.  No basilar stenosis.  Posterior circulation branch vessels are patent.  IMPRESSION: Negative intracranial MR angiography of the large and medium-sized vessels.   Original Report Authenticated By: Paulina Fusi, M.D.    Dg Chest Port 1 View  03/16/2013  *RADIOLOGY REPORT*  Clinical Data: Generalized weakness during a week.  New finding of altered mental status and facial droop.  Left-sided weakness.  PORTABLE CHEST - 1 VIEW  Comparison: 05/16/2012  Findings: Shallow inspiration.  Borderline heart size and pulmonary vascularity are likely normal for technique.  Central  interstitial changes and peribronchial thickening suggesting chronic bronchitis. No focal airspace consolidation.  No blunting of costophrenic angles.  No pneumothorax.  Calcified and tortuous aorta. Degenerative changes in the spine.  Surgical changes in the cervical spine.  Old resection or resorption of the distal left clavicle.  No significant change since previous study.  IMPRESSION: Chronic bronchitic changes.  No focal consolidation.   Original Report Authenticated By: Burman Nieves, M.D.    Mr Mra Head/brain Wo Cm  03/17/2013  *RADIOLOGY REPORT*  Clinical Data:  Loss of consciousness.  Upper extremity paresthesia.  History of head trauma.  MRI HEAD WITHOUT CONTRAST MRA HEAD WITHOUT CONTRAST  Technique:  Multiplanar, multiecho pulse sequences of the brain and surrounding structures were obtained without intravenous contrast. Angiographic images of the head were obtained using MRA technique without contrast.  Comparison:  Head CT 03/16/2013  MRI HEAD  Findings:  There is artifact related to some metallic material imbedded within the right ear.  Diffusion imaging  shows the area of acute infarction affecting the right basal ganglia and corona radiata.  There is mild swelling but no evidence of hemorrhage or mass effect.  Elsewhere, there are chronic small vessel changes affecting the cerebral hemispheric white matter.  No large vessel territory infarction.  No mass lesion, hydrocephalus or extra-axial collection.  No pituitary mass.  No fluid in the sinuses or middle ears.  There is fluid within the mastoid air cells on the left.  IMPRESSION: Acute infarction affecting the right basal ganglia and corona radiata.  No evidence of hemorrhage or mass effect.  Chronic small vessel changes affecting the cerebral hemispheric white matter.  Fluid in the mastoid air cells on the right.  MRA HEAD  Findings: Both internal carotid arteries are widely patent into the brain.  The anterior and middle cerebral vessels are  patent without proximal stenosis, aneurysm or vascular malformation.  Both vertebral arteries are patent to the basilar with the right being dominant.  No basilar stenosis.  Posterior circulation branch vessels are patent.  IMPRESSION: Negative intracranial MR angiography of the large and medium-sized vessels.   Original Report Authenticated By: Paulina Fusi, M.D.     Scheduled Meds: . sodium chloride   Intravenous STAT  . atorvastatin  40 mg Oral q1800  . clopidogrel  75 mg Oral Q breakfast  . enoxaparin (LOVENOX) injection  40 mg Subcutaneous Daily  . nebivolol  5 mg Oral Daily   Continuous Infusions: . sodium chloride      Principal Problem:   Brain TIA Active Problems:   Hyperlipidemia   CAD in native artery   Presence of stent in coronary artery   COPD (chronic obstructive pulmonary disease)  Pamella Pert, MD [facility] Pager [phone]. If 7 PM - 7 AM, please contact night-coverage at [email or web address], password [email or web address] 03/17/2013, 10:55 AM  LOS: 1 day

## 2013-03-18 NOTE — Procedures (Signed)
EEG NUMBER:  [id number].  INDICATION FOR STUDY:  A 67 year old man who experienced an episode of loss of consciousness of unclear etiology.  The patient does not possibly have TIA.  However, generalized seizure with residual focal motor weakness cannot be ruled out.  Study is being performed to rule out indications of new onset seizure disorder.  DESCRIPTION:  This is a routine EEG recording performed during wakefulness.  Predominant background activity consisted of 10 Hz symmetrical alpha rhythm, which attenuated well with eye opening. Amplitude of cerebral activity was low diffusely.  Photic stimulation was not performed.  Hyperventilation was not performed.  No epileptiform discharges were recorded.  INTERPRETATION:  This is a normal EEG recording.  There was no evidence of an epileptic disorder recorded.     Noel Christmas, MD    [id number] D:  03/17/2013 13:13:42  T:  03/17/2013 23:53:47  Job #:  [id number]

## 2013-03-22 ENCOUNTER — Telehealth: Payer: Self-pay | Admitting: *Deleted

## 2013-03-22 ENCOUNTER — Ambulatory Visit (INDEPENDENT_AMBULATORY_CARE_PROVIDER_SITE_OTHER): Payer: 59 | Admitting: Cardiology

## 2013-03-22 ENCOUNTER — Encounter: Payer: Self-pay | Admitting: Cardiology

## 2013-03-22 VITALS — BP 157/80 | HR 56 | Ht 69.0 in | Wt 157.0 lb

## 2013-03-22 DIAGNOSIS — I2510 Atherosclerotic heart disease of native coronary artery without angina pectoris: Secondary | ICD-10-CM

## 2013-03-22 DIAGNOSIS — E785 Hyperlipidemia, unspecified: Secondary | ICD-10-CM

## 2013-03-22 DIAGNOSIS — Z9861 Coronary angioplasty status: Secondary | ICD-10-CM

## 2013-03-22 DIAGNOSIS — Z955 Presence of coronary angioplasty implant and graft: Secondary | ICD-10-CM

## 2013-03-22 NOTE — Telephone Encounter (Signed)
Okay to increase Crestor to 40 mg

## 2013-03-22 NOTE — Telephone Encounter (Signed)
Patient wife called wanting to know if her husband can stay on Crestor 20 mg and just increase it to 40 MG this is what he was previously on and her tolerated it very well. When patient  was admitted into the hospital  For a stroke they changed his medication to Lipitor 40 MG  because his lipids where 106. Per Dr. Pearlean Brownie the patient can go back on crestor and increase it to 40 MG.

## 2013-03-22 NOTE — Patient Instructions (Addendum)
The current medical regimen is effective;  continue present plan and medications.  Your physician has requested that you have an exercise tolerance test. For further information please visit [email or web address]. Please also follow instruction sheet, as given.   

## 2013-03-22 NOTE — Progress Notes (Signed)
HPI The patient presents for evaluation of known coronary disease. This is his first visit with me. He was actually referred after being in the hospital recently with a stroke. I have extensively reviewed these records and the results of imaging studies. This included carotid Dopplers which demonstrated no significant stenosis. Echocardiography a mildly reduced ejection fraction of 45-50% with global hypokinesis. MRI was consistent with right basal ganglia acute infarct in the neurology report suggests small vessel disease. He was told to followup here because of a mildly reduced ejection fraction and previous coronary disease. He has had stenting at [facility] in 2007 though I don't have any of these records. He has not had further cardiovascular studies or symptoms since then.  He does not exercise but he does all the chores at home including housekeeping. With this he gets none of the back discomfort that was his previous angina. He denies chest pressure, neck or arm discomfort. He does not describe palpitations , presyncope or syncope.he does not have PND or orthopnea. However, he does have chronic dyspnea on exertion. Of note he smoked cigarettes previously and then cigars for years. He quit cigars with his stroke.  No Known Allergies  Current Outpatient Prescriptions  Medication Sig Dispense Refill  . nebivolol (BYSTOLIC) 5 MG tablet Take 5 mg by mouth daily.      . rosuvastatin (CRESTOR) 40 MG tablet Take 40 mg by mouth daily.      . traMADol (ULTRAM) 50 MG tablet Take 50 mg by mouth every 6 (six) hours as needed for pain.      . vitamin C (ASCORBIC ACID) 500 MG tablet Take 500 mg by mouth daily.       No current facility-administered medications for this visit.    Past Medical History  Diagnosis Date  . Myocardial infarction     2007 [facility]  . Concussion     hx of  . Pneumonia     exposure to caustic chemicals  . Arthritis   . COPD (chronic obstructive pulmonary disease)   . Amnesia       He was found and reuintied with his family in 78 after 27 years.    Past Surgical History  Procedure Laterality Date  . Cardiac catheterization    . Coronary angioplasty    . Tonsillectomy    . Joint replacement      Left knee replacement  . Rotator cuff repair      left  . Colonoscopy w/ polypectomy    . Anterior cervical decomp/discectomy fusion  05/16/2012    Procedure: ANTERIOR CERVICAL DECOMPRESSION/DISCECTOMY FUSION 2 LEVELS;  Surgeon: Barnett Abu, MD;  Location: [facility];  Service: Neurosurgery;  Laterality: N/A;  Cervical Five-Six, Cervical Six-Seven Anterior Cervical Decompression/Diskectomy Fusion    Family History  Problem Relation Age of Onset  . Anesthesia problems Neg Hx   . Hypotension Neg Hx   . Malignant hyperthermia Neg Hx   . Pseudochol deficiency Neg Hx     History   Social History  . Marital Status: Married    Spouse Name: N/A    Number of Children: N/A  . Years of Education: N/A   Occupational History  . Not on file.   Social History Main Topics  . Smoking status: Former Smoker    Types: Cigars  . Smokeless tobacco: Not on file     Comment: Smoked cigars for years after quitting cigarettes.    . Alcohol Use: No  . Drug Use: Yes  Special: Marijuana  . Sexually Active: Yes   Other Topics Concern  . Not on file   Social History Narrative   Lives with his fourth wife.     ROS:  Positive for dentures, chronic dyspnea with exertion, joint pain.  Otherwise as stated in the HPI and negative for all other systems.  PHYSICAL EXAM BP 157/80  Pulse 56  Ht 5\' 9"  (1.753 m)  Wt 157 lb (71.215 kg)  BMI 23.17 kg/m2 GENERAL:  Well appearing HEENT:  Pupils equal round and reactive, fundi not visualized, oral mucosa unremarkable, dentures NECK:  No jugular venous distention, waveform within normal limits, carotid upstroke brisk and symmetric, no bruits, no thyromegaly LYMPHATICS:  No cervical, inguinal adenopathy LUNGS:  Clear to  auscultation bilaterally BACK:  No CVA tenderness CHEST:  Unremarkable HEART:  PMI not displaced or sustained,S1 and S2 within normal limits, no S3, no S4, no clicks, no rubs, no murmurs ABD:  Flat, positive bowel sounds normal in frequency in pitch, no bruits, no rebound, no guarding, no midline pulsatile mass, no hepatomegaly, no splenomegaly EXT:  2 plus pulses throughout, no edema, no cyanosis no clubbing SKIN:  No rashes no nodules NEURO:  Cranial nerves II through XII grossly intact, motor grossly intact throughout PSYCH:  Cognitively intact, oriented to person place and time   EKG:  Sinus rhythm, rate 63, low voltage in the limb leads, poor anterior R wave progression, no acute ST-T wave changes.  03/22/2013  ASSESSMENT AND PLAN  CAD:  I don't have his previous records but I would like to try to get a copy of these. I would like to screen him with an exercise treadmill test given his past history.  I will do this on medication understanding that he may not get to his target heart rate.  HTN:  His blood pressure is slightly elevated. It was elevated in a couple of readings recently in the hospital. This can be evaluated at the time of his treadmill with medications added if he has a hypertensive response. He does need aggressive risk reduction.  CARDIOMYOPATHY:  He does have a mildly reduced ejection fraction though I don't know if this is baseline since his previous MI. I will reassess this probably in one year. At this point he is euvolemic. No change in therapy is planned.  DYSLIPIDEMIA:  He is on a target dose of statin and his LDL was 106. His HDL was 40. He will continue on this therapy.

## 2013-03-23 MED ORDER — ROSUVASTATIN CALCIUM 40 MG PO TABS: 40.0000 mg | ORAL_TABLET | Freq: Every day | ORAL | Status: DC

## 2013-03-23 NOTE — Telephone Encounter (Signed)
Rx has been sent.  I called patient twice, each time got no answer, no VM.

## 2013-04-02 ENCOUNTER — Emergency Department (HOSPITAL_COMMUNITY): Payer: 59

## 2013-04-02 ENCOUNTER — Encounter (HOSPITAL_COMMUNITY): Payer: Self-pay

## 2013-04-02 ENCOUNTER — Observation Stay (HOSPITAL_COMMUNITY)
Admission: EM | Admit: 2013-04-02 | Discharge: 2013-04-03 | Disposition: A | Discharge status: Home / Self Care | Payer: 59 | Attending: Internal Medicine | Admitting: Internal Medicine

## 2013-04-02 DIAGNOSIS — J4489 Other specified chronic obstructive pulmonary disease: Secondary | ICD-10-CM | POA: Insufficient documentation

## 2013-04-02 DIAGNOSIS — R29810 Facial weakness: Secondary | ICD-10-CM | POA: Insufficient documentation

## 2013-04-02 DIAGNOSIS — Z720 Tobacco use: Secondary | ICD-10-CM

## 2013-04-02 DIAGNOSIS — E785 Hyperlipidemia, unspecified: Secondary | ICD-10-CM

## 2013-04-02 DIAGNOSIS — I69959 Hemiplegia and hemiparesis following unspecified cerebrovascular disease affecting unspecified side: Secondary | ICD-10-CM | POA: Insufficient documentation

## 2013-04-02 DIAGNOSIS — T4595XA Adverse effect of unspecified primarily systemic and hematological agent, initial encounter: Secondary | ICD-10-CM | POA: Insufficient documentation

## 2013-04-02 DIAGNOSIS — I10 Essential (primary) hypertension: Secondary | ICD-10-CM | POA: Insufficient documentation

## 2013-04-02 DIAGNOSIS — I619 Nontraumatic intracerebral hemorrhage, unspecified: Principal | ICD-10-CM

## 2013-04-02 DIAGNOSIS — R4789 Other speech disturbances: Secondary | ICD-10-CM | POA: Insufficient documentation

## 2013-04-02 DIAGNOSIS — J449 Chronic obstructive pulmonary disease, unspecified: Secondary | ICD-10-CM

## 2013-04-02 DIAGNOSIS — G459 Transient cerebral ischemic attack, unspecified: Secondary | ICD-10-CM

## 2013-04-02 DIAGNOSIS — T39095A Adverse effect of salicylates, initial encounter: Secondary | ICD-10-CM | POA: Insufficient documentation

## 2013-04-02 DIAGNOSIS — F17200 Nicotine dependence, unspecified, uncomplicated: Secondary | ICD-10-CM | POA: Insufficient documentation

## 2013-04-02 DIAGNOSIS — I639 Cerebral infarction, unspecified: Secondary | ICD-10-CM

## 2013-04-02 DIAGNOSIS — Z9861 Coronary angioplasty status: Secondary | ICD-10-CM | POA: Insufficient documentation

## 2013-04-02 DIAGNOSIS — I2510 Atherosclerotic heart disease of native coronary artery without angina pectoris: Secondary | ICD-10-CM | POA: Diagnosis present

## 2013-04-02 HISTORY — DX: Cerebral infarction, unspecified: I63.9

## 2013-04-02 LAB — DIFFERENTIAL
Lymphocytes Relative: 29 % (ref 12–46)
Lymphs Abs: 2.4 10*3/uL (ref 0.7–4.0)
Monocytes Absolute: 0.6 10*3/uL (ref 0.1–1.0)
Monocytes Relative: 8 % (ref 3–12)
Neutro Abs: 4.9 10*3/uL (ref 1.7–7.7)

## 2013-04-02 LAB — POCT I-STAT, CHEM 8
BUN: 9 mg/dL (ref 6–23)
Calcium, Ion: 1.17 mmol/L (ref 1.13–1.30)
Creatinine, Ser: 0.8 mg/dL (ref 0.50–1.35)
Hemoglobin: 15.6 g/dL (ref 13.0–17.0)
Sodium: 142 mEq/L (ref 135–145)
TCO2: 27 mmol/L (ref 0–100)

## 2013-04-02 LAB — CBC
HCT: 43.1 % (ref 39.0–52.0)
HCT: 44.1 % (ref 39.0–52.0)
Hemoglobin: 15 g/dL (ref 13.0–17.0)
Hemoglobin: 15.9 g/dL (ref 13.0–17.0)
MCHC: 34.8 g/dL (ref 30.0–36.0)
MCHC: 36.1 g/dL — ABNORMAL HIGH (ref 30.0–36.0)
WBC: 8.2 10*3/uL (ref 4.0–10.5)

## 2013-04-02 LAB — CREATININE, SERUM
GFR calc Af Amer: >90 mL/min (ref >90)
GFR calc non Af Amer: 89 mL/min — ABNORMAL LOW (ref >90)

## 2013-04-02 LAB — COMPREHENSIVE METABOLIC PANEL
BUN: 10 mg/dL (ref 6–23)
CO2: 28 mEq/L (ref 19–32)
Chloride: 103 mEq/L (ref 96–112)
Creatinine, Ser: 0.86 mg/dL (ref 0.50–1.35)
GFR calc non Af Amer: 88 mL/min — ABNORMAL LOW (ref >90)
Total Bilirubin: 0.3 mg/dL (ref 0.3–1.2)

## 2013-04-02 LAB — TROPONIN I: Troponin I: <0.3 ng/mL (ref <0.30)

## 2013-04-02 LAB — APTT: aPTT: 53 seconds — ABNORMAL HIGH (ref 24–37)

## 2013-04-02 LAB — GLUCOSE, CAPILLARY

## 2013-04-02 LAB — POCT I-STAT TROPONIN I

## 2013-04-02 MED ORDER — HEPARIN SODIUM (PORCINE) 5000 UNIT/ML IJ SOLN
5000.0000 [IU] | Freq: Three times a day (TID) | INTRAMUSCULAR | Status: DC
  Administered 2013-04-02 – 2013-04-03 (×3): 5000 [IU] via SUBCUTANEOUS
  Filled 2013-04-02 (×5): qty 1

## 2013-04-02 MED ORDER — SODIUM CHLORIDE 0.9 % IV SOLN
INTRAVENOUS | Status: DC
  Administered 2013-04-02: 22:00:00 via INTRAVENOUS

## 2013-04-02 MED ORDER — CLOPIDOGREL BISULFATE 75 MG PO TABS
75.0000 mg | ORAL_TABLET | Freq: Every day | ORAL | Status: DC
  Filled 2013-04-02: qty 1

## 2013-04-02 MED ORDER — VITAMIN C 500 MG PO TABS
1000.0000 mg | ORAL_TABLET | Freq: Every day | ORAL | Status: DC
  Administered 2013-04-03: 1000 mg via ORAL
  Filled 2013-04-02: qty 2

## 2013-04-02 MED ORDER — ROSUVASTATIN CALCIUM 40 MG PO TABS
40.0000 mg | ORAL_TABLET | Freq: Every day | ORAL | Status: DC
  Filled 2013-04-02 (×2): qty 1

## 2013-04-02 MED ORDER — NON FORMULARY
40.0000 mg | Freq: Every day | Status: DC
Start: 2013-04-02 — End: 2013-04-02

## 2013-04-02 MED ORDER — NICOTINE 14 MG/24HR TD PT24
14.0000 mg | MEDICATED_PATCH | Freq: Every day | TRANSDERMAL | Status: DC
  Administered 2013-04-02 – 2013-04-03 (×2): 14 mg via TRANSDERMAL
  Filled 2013-04-02 (×2): qty 1

## 2013-04-02 MED ORDER — ATORVASTATIN CALCIUM 80 MG PO TABS: 80.0000 mg | ORAL_TABLET | Freq: Every day | ORAL | Status: DC

## 2013-04-02 MED ORDER — NEBIVOLOL HCL 5 MG PO TABS
5.0000 mg | ORAL_TABLET | Freq: Every day | ORAL | Status: DC
  Administered 2013-04-03: 5 mg via ORAL
  Filled 2013-04-02 (×2): qty 1

## 2013-04-02 MED ORDER — VITAMIN C 500 MG PO TABS: 1000.0000 mg | ORAL_TABLET | Freq: Every day | ORAL | Status: DC

## 2013-04-02 NOTE — H&P (Signed)
[facility] History and Physical  Luis Salinas ZOX:[id number] DOB: 17-Nov-1946 DOA: 04/02/2013  Referring physician: Karma Ganja, EDP PCP: Phill Myron, NP  Specialists: Neurology  Chief Complaint: stroke  HPI: Luis Salinas is a 67 y.o. male Caucasian male who presented to the emergency room from home 04/02/13 with left-sided weakness and dysarthria. He was fine this morning when he woke however his wife come back from having gone out and had brought lunch she awoke him and she noted was clumsy using utensils to eat and he had a slurred speech. This seems to have resolved rapidly en route. He has been compliant on his Plavix as well as his blood pressure medications had restarted smoking again yesterday. He states that he feels completely back to his baseline at present. He denies any recent nausea vomiting chest pain dark stool tarry stool per vision double vision. He is hungry but has not passed stroke swallow evaluation yet. No fever no chills no falls  Review of Systems: Negative except as above  Past Medical History  Diagnosis Date  . Myocardial infarction     2007 [facility]  . Concussion     hx of  . Pneumonia     exposure to caustic chemicals  . Arthritis   . COPD (chronic obstructive pulmonary disease)   . Amnesia     He was found and reuintied with his family in 50 after 27 years.  . Stroke    Admission 03/16/13 for R Basal ganglia and Corona CVA Admission 6.3.13 for Cervical spondylosis c5--7 repair   Past Surgical History  Procedure Laterality Date  . Cardiac catheterization    . Coronary angioplasty    . Tonsillectomy    . Joint replacement      Left knee replacement  . Rotator cuff repair      left  . Colonoscopy w/ polypectomy    . Anterior cervical decomp/discectomy fusion  05/16/2012    Procedure: ANTERIOR CERVICAL DECOMPRESSION/DISCECTOMY FUSION 2 LEVELS;  Surgeon: Barnett Abu, MD;  Location: [facility];  Service: Neurosurgery;  Laterality: N/A;   Cervical Five-Six, Cervical Six-Seven Anterior Cervical Decompression/Diskectomy Fusion   Social History:  reports that he has quit smoking. His smoking use included Cigars. He does not have any smokeless tobacco history on file. He reports that he uses illicit drugs (Marijuana). He reports that he does not drink alcohol. Patient is a home with his wife  No Known Allergies  Family History  Problem Relation Age of Onset  . Anesthesia problems Neg Hx   . Hypotension Neg Hx   . Malignant hyperthermia Neg Hx   . Pseudochol deficiency Neg Hx     Prior to Admission medications   Medication Sig Start Date End Date Taking? Authorizing Provider  Ascorbic Acid (VITAMIN C) 1000 MG tablet Take 1,000 mg by mouth daily.   Yes Historical Provider, MD  clopidogrel (PLAVIX) 75 MG tablet Take 75 mg by mouth daily.   Yes Historical Provider, MD  nebivolol (BYSTOLIC) 5 MG tablet Take 5 mg by mouth daily.   Yes Historical Provider, MD  rosuvastatin (CRESTOR) 40 MG tablet Take 40 mg by mouth daily.   Yes Historical Provider, MD  traMADol (ULTRAM) 50 MG tablet Take 50 mg by mouth every 6 (six) hours as needed for pain.   Yes Historical Provider, MD   Physical Exam: Filed Vitals:   04/02/13 1423  BP: 122/80  Pulse: 53  Temp: 98.3 F (36.8 C)  TempSrc: Oral  Resp:  16  SpO2: 94%     General:  Alert pleasant oriented mild drooping of the mouth on the right side  Eyes: Extraocular movements intact, funduscopy deferred, vision by direct confrontation is normal, finger-nose-finger test normal,  ENT: Uvula midline  Neck: Soft supple no  Cardiovascular: S1-S2 no murmur rub or  Respiratory: Clinically clear  Abdomen: Soft nontender numbness to  Skin: Intact  Musculoskeletal: Range of motion intact  Psychiatric: Euthymic  Neurologic: Vital 5 strength bilaterally in major muscle he gait not assessed. Reflexes 2/3. Sensation bilaterally intact. Babinski's are downward going. Slight drooping of  the right side of mouth. No dysarthria noted.  Labs on Admission:  Basic Metabolic Panel:  Recent Labs Lab 04/02/13 1414 04/02/13 1425  NA 139 142  K 3.7 3.7  CL 103 103  CO2 28  --   GLUCOSE 104* 106*  BUN 10 9  CREATININE 0.86 0.80  CALCIUM 9.2  --    Liver Function Tests:  Recent Labs Lab 04/02/13 1414  AST 12  ALT 8  ALKPHOS 94  BILITOT 0.3  PROT 6.9  ALBUMIN 3.4*   No results found for this basename: LIPASE, AMYLASE,  in the last 168 hours No results found for this basename: AMMONIA,  in the last 168 hours CBC:  Recent Labs Lab 04/02/13 1414 04/02/13 1425  WBC 8.2  --   NEUTROABS 4.9  --   HGB 15.0 15.6  HCT 43.1 46.0  MCV 80.4  --   PLT 184  --    Cardiac Enzymes:  Recent Labs Lab 04/02/13 1417  TROPONINI <0.30    BNP (last 3 results) No results found for this basename: PROBNP,  in the last 8760 hours CBG:  Recent Labs Lab 04/02/13 1437  GLUCAP 104*    Radiological Exams on Admission: Ct Head Wo Contrast  04/02/2013  *RADIOLOGY REPORT*  Clinical Data: 67 year old male.  Code stroke.  Slurred speech confusion right side facial droop.  Recent right brain infarct.  CT HEAD WITHOUT CONTRAST  Technique:  Contiguous axial images were obtained from the base of the skull through the vertex without contrast.  Comparison: Brain MRI 03/17/2013 and earlier.  Findings: Chronic left mastoid effusion.  Negative visible nasopharynx.  Chronic left sphenoid sinusitis.  Other Visualized paranasal sinuses and mastoids are clear.  No acute osseous abnormality identified.  Visualized orbits and scalp soft tissues are within normal limits.  Calcified atherosclerosis at the skull base.  Sequelae of right basal ganglia lacunar infarct now with encephalomalacia. Otherwise, stable gray-white matter differentiation in the right hemisphere.  Stable posterior fossa gray-white matter differentiation.  Subtle hypodensity in the left thalamus on series 2 image 16 might be new.  No evidence of cortically based acute infarction identified.  No midline shift, mass effect, or evidence of mass lesion.  No ventriculomegaly. No acute intracranial hemorrhage identified. Dural calcifications. No suspicious intracranial vascular hyperdensity.  IMPRESSION: 1.  Questionable new sub centimeter hypodensity in the left thalamus, such as due to left thalamic lacunar infarct. Clinical correlation recommended. 2.  Otherwise stable with interval expected evolution of right basal ganglia lacunar infarct.  Critical Value/emergent results were called by telephone at the time of interpretation on 04/02/2013 at 1424 hours to Dr. Amada Jupiter, who verbally acknowledged these results.   Original Report Authenticated By: Erskine Speed, M.D.     EKG: Independently reviewed. Sinus rhythm PVCs  Assessment/Plan Active Problems:   Brain TIA   Hyperlipidemia   CAD in native artery  COPD (chronic obstructive pulmonary disease)   1. Possible TIA-patient will be admitted. Neurology has seen patient-will get Plavix for secondary prevention-possibly Aggrenox once decision made by stroke team. Patient outside window TPA and also recent stroke precludes use of TPA. Recent evaluation has included echocardiogram, carotids, lipid evaluation and A1c and doesn't need to be repeated 2. Hypertension-continue diastolic mammogram daily 3. History CAD-patient has a stent since 2007. He may need to antiplatelet therapy.  4. Tobacco abuse-patient willing to use patch we will order the same 5. COPD-no wheezes or rales. Consider smoking cessation-add albuterol if has wheeze  Neurology is seen Full code oBS status, telemetry, 50 min Discussed with family at bedside  Rhetta Mura [facility] Pager [phone]  If 7PM-7AM, please contact night-coverage [email or web address] Password [email or web address] 04/02/2013, 4:10 PM

## 2013-04-02 NOTE — ED Provider Notes (Signed)
History     CSN: [id number]  Arrival date & time 04/02/13  1407   First MD Initiated Contact with Patient 04/02/13 1432      Chief Complaint  Patient presents with  . Code Stroke    (Consider location/radiation/quality/duration/timing/severity/associated sxs/prior treatment) HPI A LEVEL 5 CAVEAT PERTAINS DUE TO URGENT NEED FOR INTERVENTION Pt presents with c/o slurred speech and right sided facial droop.  Wife states she saw him at 10:30am and then when to church, when she returned at 1:30pm she noted the above symptoms.  He had recently been hospitalized for stroke which affected his left side.  Pt has been taking plavix. Per wife his speech is improving but faical droop remains.  Past Medical History  Diagnosis Date  . Myocardial infarction     2007 [facility]  . Concussion     hx of  . Pneumonia     exposure to caustic chemicals  . Arthritis   . COPD (chronic obstructive pulmonary disease)   . Amnesia     He was found and reuintied with his family in 15 after 27 years.  . Stroke     Past Surgical History  Procedure Laterality Date  . Cardiac catheterization    . Coronary angioplasty    . Tonsillectomy    . Joint replacement      Left knee replacement  . Rotator cuff repair      left  . Colonoscopy w/ polypectomy    . Anterior cervical decomp/discectomy fusion  05/16/2012    Procedure: ANTERIOR CERVICAL DECOMPRESSION/DISCECTOMY FUSION 2 LEVELS;  Surgeon: Barnett Abu, MD;  Location: [facility];  Service: Neurosurgery;  Laterality: N/A;  Cervical Five-Six, Cervical Six-Seven Anterior Cervical Decompression/Diskectomy Fusion    Family History  Problem Relation Age of Onset  . Anesthesia problems Neg Hx   . Hypotension Neg Hx   . Malignant hyperthermia Neg Hx   . Pseudochol deficiency Neg Hx     History  Substance Use Topics  . Smoking status: Former Smoker    Types: Cigars  . Smokeless tobacco: Not on file     Comment: Smoked cigars for years after quitting  cigarettes.    . Alcohol Use: No      Review of Systems ROS reviewed and all otherwise negative except for mentioned in HPI  Allergies  Review of patient's allergies indicates no known allergies.  Home Medications   No current outpatient prescriptions on file.  BP 140/72  Pulse 57  Temp(Src) 97.5 F (36.4 C) (Oral)  Resp 17  Ht 5\' 9"  (1.753 m)  Wt 151 lb 4.8 oz (68.629 kg)  BMI 22.33 kg/m2  SpO2 100% Vitals reviewed Physical Exam Physical Examination: General appearance - alert, well appearing, and in no distress Mental status - alert, oriented to person, place, not to time Eyes - pupils equal and reactive, extraocular eye movements intact Mouth - mucous membranes moist, pharynx normal without lesions Chest - clear to auscultation, no wheezes, rales or rhonchi, symmetric air entry Heart - normal rate, regular rhythm, normal S1, S2, no murmurs, rubs, clicks or gallops Abdomen - soft, nontender, nondistended, no masses or organomegaly Neurological - alert, oriented x 2, mild slurring of speech, right sided facial droop, strength 4+/5 in right and left upper extremity, 5/5 in right upper extremity, sensation intact in extremties x 4 Extremities - peripheral pulses normal, no pedal edema, no clubbing or cyanosis Skin - normal coloration and turgor, no rashes  ED Course  Procedures (including critical  care time)   Date: 04/02/2013  Rate: 57  Rhythm: normal sinus rhythm  QRS Axis: left  Intervals: normal  ST/T Wave abnormalities: nonspecific ST/T changes  Conduction Disutrbances:left anterior fascicular block  Narrative Interpretation:   Old EKG Reviewed: no significant changes compared to 03/16/13  4:14 PM d/w [facility] for admission, pt to be admitted to tel  CRITICAL CARE Performed by: Ethelda Chick   Total critical care time: 35  Critical care time was exclusive of separately billable procedures and treating other patients.  Critical care was necessary to treat  or prevent imminent or life-threatening deterioration.  Critical care was time spent personally by me on the following activities: development of treatment plan with patient and/or surrogate as well as nursing, discussions with consultants, evaluation of patient's response to treatment, examination of patient, obtaining history from patient or surrogate, ordering and performing treatments and interventions, ordering and review of laboratory studies, ordering and review of radiographic studies, pulse oximetry and re-evaluation of patient's condition.   Labs Reviewed  APTT - Abnormal; Notable for the following:    aPTT 53 (*)    All other components within normal limits  COMPREHENSIVE METABOLIC PANEL - Abnormal; Notable for the following:    Glucose, Bld 104 (*)    Albumin 3.4 (*)    GFR calc non Af Amer 88 (*)    All other components within normal limits  GLUCOSE, CAPILLARY - Abnormal; Notable for the following:    Glucose-Capillary 104 (*)    All other components within normal limits  CBC - Abnormal; Notable for the following:    WBC 10.6 (*)    MCHC 36.1 (*)    All other components within normal limits  CREATININE, SERUM - Abnormal; Notable for the following:    GFR calc non Af Amer 89 (*)    All other components within normal limits  BASIC METABOLIC PANEL - Abnormal; Notable for the following:    Glucose, Bld 100 (*)    All other components within normal limits  POCT I-STAT, CHEM 8 - Abnormal; Notable for the following:    Glucose, Bld 106 (*)    All other components within normal limits  ETHANOL  PROTIME-INR  CBC  DIFFERENTIAL  TROPONIN I  GLUCOSE, CAPILLARY  POCT I-STAT TROPONIN I   Ct Head Wo Contrast  04/02/2013  *RADIOLOGY REPORT*  Clinical Data: 67 year old male.  Code stroke.  Slurred speech confusion right side facial droop.  Recent right brain infarct.  CT HEAD WITHOUT CONTRAST  Technique:  Contiguous axial images were obtained from the base of the skull through the  vertex without contrast.  Comparison: Brain MRI 03/17/2013 and earlier.  Findings: Chronic left mastoid effusion.  Negative visible nasopharynx.  Chronic left sphenoid sinusitis.  Other Visualized paranasal sinuses and mastoids are clear.  No acute osseous abnormality identified.  Visualized orbits and scalp soft tissues are within normal limits.  Calcified atherosclerosis at the skull base.  Sequelae of right basal ganglia lacunar infarct now with encephalomalacia. Otherwise, stable gray-white matter differentiation in the right hemisphere.  Stable posterior fossa gray-white matter differentiation.  Subtle hypodensity in the left thalamus on series 2 image 16 might be new. No evidence of cortically based acute infarction identified.  No midline shift, mass effect, or evidence of mass lesion.  No ventriculomegaly. No acute intracranial hemorrhage identified. Dural calcifications. No suspicious intracranial vascular hyperdensity.  IMPRESSION: 1.  Questionable new sub centimeter hypodensity in the left thalamus, such as due to left thalamic  lacunar infarct. Clinical correlation recommended. 2.  Otherwise stable with interval expected evolution of right basal ganglia lacunar infarct.  Critical Value/emergent results were called by telephone at the time of interpretation on 04/02/2013 at 1424 hours to Dr. Amada Jupiter, who verbally acknowledged these results.   Original Report Authenticated By: Erskine Speed, M.D.      1. CVA (cerebral infarction)       MDM  Pt seen immediately upon arrival, code stroke notification.  Symptoms are improving but patient does have a right sided facial droop.  Head CT shows new hypodensity in left thalamus.  Stroke team has evaluated as well.  Not a candidate for TPA due to recent prior stroke.  I have called hospitalist for admission, neurology to follow.         Ethelda Chick, MD 04/03/13 1343

## 2013-04-02 NOTE — Consult Note (Signed)
Reason for Consult: Dysarthria rule out new CVA Referring Physician: Dr Karma Ganja  CC: Dysarthria  HPI: Luis Salinas is an 67 y.o. male who was recently admitted to [facility] on 03/16/2013 with a right basal ganglia lacunar infarct. The patient initially presented with dysarthria and left-sided weakness. His symptoms improved and he was discharged on 03/17/2013. Today, 04/02/2013, the patient's wife went to church leaving the patient at home. She last saw him normal at 10:30 AM. She called him around 12:30 on her way home from church and noted him to be very dysarthric. She returned home and was concerned that he was having another stroke. EMS was summoned and the patient was brought to the emergency department. A stat CT scan was performed - please see results below. The patient's symptoms were quickly resolving; although, it was noted that he had a new right facial droop. TPA was not a consideration in view of the patient's recent CVA and his rapid improvement. He will be admitted for further evaluation and treatment. The patient had previously been discharged on Plavix 75 mg daily and he states that he had been taking it every day as instructed. The patient did admit to resuming tobacco use yesterday.   Past Medical History  Diagnosis Date  . Myocardial infarction     2007 [facility]  . Concussion     hx of  . Pneumonia     exposure to caustic chemicals  . Arthritis   . COPD (chronic obstructive pulmonary disease)   . Amnesia     He was found and reuintied with his family in 16 after 27 years.  . Stroke     Past Surgical History  Procedure Laterality Date  . Cardiac catheterization    . Coronary angioplasty    . Tonsillectomy    . Joint replacement      Left knee replacement  . Rotator cuff repair      left  . Colonoscopy w/ polypectomy    . Anterior cervical decomp/discectomy fusion  05/16/2012    Procedure: ANTERIOR CERVICAL DECOMPRESSION/DISCECTOMY FUSION 2 LEVELS;   Surgeon: Barnett Abu, MD;  Location: [facility];  Service: Neurosurgery;  Laterality: N/A;  Cervical Five-Six, Cervical Six-Seven Anterior Cervical Decompression/Diskectomy Fusion    Family History  Problem Relation Age of Onset  . Anesthesia problems Neg Hx   . Hypotension Neg Hx   . Malignant hyperthermia Neg Hx   . Pseudochol deficiency Neg Hx     Social History:  reports that he has quit smoking. His smoking use included Cigars. He does not have any smokeless tobacco history on file. He reports that he uses illicit drugs (Marijuana). He reports that he does not drink alcohol.  No Known Allergies  Medications: No current facility-administered medications for this encounter. Current outpatient prescriptions:Ascorbic Acid (VITAMIN C) 1000 MG tablet, Take 1,000 mg by mouth daily., Disp: , Rfl: ;  clopidogrel (PLAVIX) 75 MG tablet, Take 75 mg by mouth daily., Disp: , Rfl: ;  nebivolol (BYSTOLIC) 5 MG tablet, Take 5 mg by mouth daily., Disp: , Rfl: ;  rosuvastatin (CRESTOR) 40 MG tablet, Take 40 mg by mouth daily., Disp: , Rfl:  traMADol (ULTRAM) 50 MG tablet, Take 50 mg by mouth every 6 (six) hours as needed for pain., Disp: , Rfl:   ROS: History obtained from the patient and wife  General ROS: negative for - chills, fever, night sweats, weight gain or weight loss Positive for generalized fatigue. Psychological ROS: negative for -  behavioral disorder, hallucinations, memory difficulties, mood swings or suicidal ideation Ophthalmic ROS: negative for - blurry vision, double vision, eye pain or loss of vision ENT ROS: negative for - epistaxis, nasal discharge, oral lesions, sore throat, tinnitus or vertigo Allergy and Immunology ROS: negative for - hives or itchy/watery eyes Hematological and Lymphatic ROS: negative for - bleeding problems, bruising or swollen lymph nodes Endocrine ROS: negative for - galactorrhea, hair pattern changes, polydipsia/polyuria or temperature  intolerance Respiratory ROS: negative for - cough, hemoptysis, shortness of breath or wheezing Cardiovascular ROS: negative for - chest pain, dyspnea on exertion, edema or irregular heartbeat Gastrointestinal ROS: negative for - abdominal pain, diarrhea, hematemesis, nausea/vomiting or stool incontinence Genito-Urinary ROS: negative for - dysuria, hematuria, incontinence or urinary frequency/urgency Musculoskeletal ROS: negative for - joint swelling or muscular weakness Neurological ROS: as noted in HPI Dermatological ROS: negative for rash and skin lesion changes  Physical Examination: Blood pressure 122/80, pulse 53, temperature 98.3 F (36.8 C), temperature source Oral, resp. rate 16, SpO2 94.00%.   General - 67 year old male lying down in no acute distress. Heart - Regular rate and rhythm - no murmer Lungs - Clear to auscultation but significantly decreased throughout. Abdomen - Soft - non tender Extremities - Distal pulses intact - no edema Skin - Warm and dry  Mental Status: Alert, oriented with some cueing, thought content appropriate.  Speech fluent without evidence of aphasia. Possibly very mild dysarthria.  Able to follow 3 step commands without difficulty. Cranial Nerves: II: Discs difficult to visualize; Visual fields grossly normal, pupils equal, round, reactive to light and accommodation III,IV, VI: ptosis not present, extra-ocular motions intact bilaterally V,VII: Mild right facial droop, facial light touch sensation normal bilaterally VIII: hearing normal bilaterally IX,X: gag reflex present XI: bilateral shoulder shrug XII: midline tongue extension Motor: Right : Upper extremity   5/5    Left:     Upper extremity   5/5  Lower extremity   5/5     Lower extremity   5/5 Tone and bulk:normal tone throughout; no atrophy noted Sensory: light touch intact throughout, bilaterally Deep Tendon Reflexes: 2+ and symmetric throughout Plantars: Right: downgoing   Left:  downgoing Cerebellar: normal finger-to-nose, normal rapid alternating movements and normal heel-to-shin test Gait: Did not ambulate the patient secondary topatietn safety concerns.  CV: pulses palpable throughout    Laboratory Studies:   Basic Metabolic Panel:  Recent Labs Lab 04/02/13 1425  NA 142  K 3.7  CL 103  GLUCOSE 106*  BUN 9  CREATININE 0.80    Liver Function Tests: No results found for this basename: AST, ALT, ALKPHOS, BILITOT, PROT, ALBUMIN,  in the last 168 hours No results found for this basename: LIPASE, AMYLASE,  in the last 168 hours No results found for this basename: AMMONIA,  in the last 168 hours  CBC:  Recent Labs Lab 04/02/13 1414 04/02/13 1425  WBC 8.2  --   NEUTROABS 4.9  --   HGB 15.0 15.6  HCT 43.1 46.0  MCV 80.4  --   PLT 184  --     Cardiac Enzymes: No results found for this basename: CKTOTAL, CKMB, CKMBINDEX, TROPONINI,  in the last 168 hours  BNP: No components found with this basename: POCBNP,   CBG: No results found for this basename: GLUCAP,  in the last 168 hours  Microbiology: Results for orders placed during the hospital encounter of 05/13/12  SURGICAL PCR SCREEN     Status: None   Collection Time  05/13/12  9:18 AM      Result Value Range Status   MRSA, PCR NEGATIVE  NEGATIVE Final   Staphylococcus aureus NEGATIVE  NEGATIVE Final   Comment:            The Xpert SA Assay (FDA     approved for NASAL specimens     only), is one component of     a comprehensive surveillance     program.  It is not intended     to diagnose infection nor to     guide or monitor treatment.    Coagulation Studies: No results found for this basename: LABPROT, INR,  in the last 72 hours  Urinalysis: No results found for this basename: COLORURINE, APPERANCEUR, LABSPEC, PHURINE, GLUCOSEU, HGBUR, BILIRUBINUR, KETONESUR, PROTEINUR, UROBILINOGEN, NITRITE, LEUKOCYTESUR,  in the last 168 hours  Lipid Panel:     Component Value Date/Time    CHOL 158 03/17/2013 0415   TRIG 62 03/17/2013 0415   HDL 40 03/17/2013 0415   CHOLHDL 4.0 03/17/2013 0415   VLDL 12 03/17/2013 0415   LDLCALC 106* 03/17/2013 0415    HgbA1C:  Lab Results  Component Value Date   HGBA1C 6.0* 03/17/2013    Urine Drug Screen:     Component Value Date/Time   LABOPIA NONE DETECTED 03/17/2013 0619   COCAINSCRNUR NONE DETECTED 03/17/2013 0619   LABBENZ NONE DETECTED 03/17/2013 0619   AMPHETMU NONE DETECTED 03/17/2013 0619   THCU POSITIVE* 03/17/2013 0619   LABBARB NONE DETECTED 03/17/2013 0619    Alcohol Level: No results found for this basename: ETH,  in the last 168 hours  Other results: EKG: Sinus bradycardia rate 57 beats per minute  Imaging:  Ct Head Wo Contrast  04/02/13 IMPRESSION: 1.  Questionable new sub centimeter hypodensity in the left thalamus, such as due to left thalamic lacunar infarct. Clinical correlation recommended. 2.  Otherwise stable with interval expected evolution of right basal ganglia lacunar infarct.     Assessment/Plan:   Mr. Vitor Overbaugh is a 67 year old male with multiple comorbidities who was recently discharged from [facility] on 03/17/2013 following an admission for a right basal ganglia lacunar infarct. He was discharged on Plavix and did extremely well until today when he once again developed dysarthria and generalized weakness. A stat CT on arrival to the emergency department questioned a new sub centimeter hypodensity in the left thalamus possibly consistent with a left thalamic lacunar infarct. The plan at this time will be to admit the patient for further evaluation including an MRI. Aggrenox therapy will be considered as the patient has failed Plavix. It will not be necessary to repeat the entire stroke workup since this was performed just over 2 weeks ago.  Delton See PA-C [facility] Pager [phone] 04/02/2013, 3:06 PM   I have seen and evaluated the patient. I have reviewed the above note and  made appropriate changes. 67 yo M with new right sided weakness, dysarthria. I suspect a new small infarct contralateral to his previous. He is currently on plavix, and would continue this at this time, though if small vessel disease is suspected after MRI, may need to consider change to aggrenox.   1) MRI brain/ MRA head 2) Continue PLavix 3) PT,OT, ST  Ritta Slot, MD [facility] [phone]  If 7pm- 7am, please page neurology on call at [phone].

## 2013-04-02 NOTE — ED Notes (Signed)
Pt drank water from cup with no problems.  When pt drank from straw he did begin to cough.  I asked pt if he felt he got a little choked from water and he stated he did.  Swallow screen stopped.  SLP eval ordered.

## 2013-04-02 NOTE — Progress Notes (Signed)
Pt 67 year old male admitted from home through  [facility] Ed initial assessment done pt alert ,awake and oriented x 4 with rt facial droop and slurred speech . NPO as pt failed swallow evaluation For SLP to evaluate in am . Pt made aware orient to unit  .Call bell within reach. Sinus brady on cardiac monitor no distress vs stable . Will continue to monitor. Azzie Roup RN

## 2013-04-02 NOTE — ED Notes (Signed)
Pt from home.  Wife reports LSN as 1030.  Wife discovered pt with slurred speech, R facial droop, and confusion at 1330.  Code Stroke encoded and called at 1345.  Pt arrival to ED 1407.  EDP exam 1407.  Dr. Amada Jupiter with Neuro at bedside upon pt arrival to ED.  Pt arrival to CT-2 1410.  Phlebotomy arrival 1351.  Stroke team arrival 72.  Code Stroke cancelled 1421.

## 2013-04-03 ENCOUNTER — Observation Stay (HOSPITAL_COMMUNITY): Payer: 59

## 2013-04-03 DIAGNOSIS — E785 Hyperlipidemia, unspecified: Secondary | ICD-10-CM

## 2013-04-03 DIAGNOSIS — I6350 Cerebral infarction due to unspecified occlusion or stenosis of unspecified cerebral artery: Secondary | ICD-10-CM

## 2013-04-03 DIAGNOSIS — G459 Transient cerebral ischemic attack, unspecified: Secondary | ICD-10-CM

## 2013-04-03 DIAGNOSIS — J449 Chronic obstructive pulmonary disease, unspecified: Secondary | ICD-10-CM

## 2013-04-03 DIAGNOSIS — I619 Nontraumatic intracerebral hemorrhage, unspecified: Secondary | ICD-10-CM | POA: Diagnosis present

## 2013-04-03 DIAGNOSIS — F17200 Nicotine dependence, unspecified, uncomplicated: Secondary | ICD-10-CM

## 2013-04-03 LAB — BASIC METABOLIC PANEL
GFR calc Af Amer: >90 mL/min (ref >90)
GFR calc non Af Amer: >90 mL/min (ref >90)
Glucose, Bld: 100 mg/dL — ABNORMAL HIGH (ref 70–99)
Potassium: 3.7 mEq/L (ref 3.5–5.1)
Sodium: 139 mEq/L (ref 135–145)

## 2013-04-03 MED ORDER — ASPIRIN 81 MG PO CHEW
81.0000 mg | CHEWABLE_TABLET | Freq: Every day | ORAL | Status: DC
  Administered 2013-04-03: 81 mg via ORAL
  Filled 2013-04-03: qty 1

## 2013-04-03 MED ORDER — ASPIRIN-DIPYRIDAMOLE ER 25-200 MG PO CP12: ORAL_CAPSULE | ORAL | Status: DC

## 2013-04-03 MED ORDER — VARENICLINE TARTRATE 0.5 MG PO TABS: ORAL_TABLET | ORAL | Status: DC

## 2013-04-03 MED ORDER — ASPIRIN-DIPYRIDAMOLE ER 25-200 MG PO CP12
1.0000 | ORAL_CAPSULE | Freq: Every day | ORAL | Status: DC
  Filled 2013-04-03: qty 1

## 2013-04-03 NOTE — Significant Event (Signed)
Called by Dr. Duanne Limerick microhemorrhage in areas of caudate head.  No new infarct.  Passed on to neurology to coordinate  Anticoagulation.  Pleas Koch, MD [facility] [phone]

## 2013-04-03 NOTE — Evaluation (Signed)
Agree with student findings. Satoru Milich L. Kemar Pandit, MA CCC/SLP Pager [phone]  

## 2013-04-03 NOTE — Progress Notes (Signed)
Stroke Team Progress Note  HISTORY Luis Salinas is an 67 y.o. male who was recently admitted to [facility] on 03/16/2013 with a right basal ganglia lacunar infarct. The patient initially presented with dysarthria and left-sided weakness. His symptoms improved and he was discharged on 03/17/2013. Today, 04/02/2013, the patient's wife went to church leaving the patient at home. She last saw him normal at 10:30 AM. She called him around 12:30 on her way home from church and noted him to be very dysarthric. She returned home and was concerned that he was having another stroke. EMS was summoned and the patient was brought to the emergency department. A stat CT scan was performed - please see results below. The patient's symptoms were quickly resolving; although, it was noted that he had a new right facial droop. TPA was not a consideration in view of the patient's recent CVA and his rapid improvement. He will be admitted for further evaluation and treatment. The patient had previously been discharged on Plavix 75 mg daily and he states that he had been taking it every day as instructed. The patient did admit to resuming tobacco use yesterday.   SUBJECTIVE His wife is at the bedside.  Overall he feels his condition is stable.   OBJECTIVE Most recent Vital Signs: Filed Vitals:   04/03/13 0128 04/03/13 0329 04/03/13 0530 04/03/13 1000  BP: 151/78 149/66 145/66 140/72  Pulse: 55 58 53 57  Temp: 97.9 F (36.6 C) 97.7 F (36.5 C) 97.9 F (36.6 C) 97.5 F (36.4 C)  TempSrc: Oral Oral Oral Oral  Resp: 17 18 18 17   Height:      Weight:      SpO2: 93% 97% 95% 100%   CBG (last 3)   Recent Labs  04/02/13 1437 04/02/13 1822  GLUCAP 104* 98    IV Fluid Intake:   . sodium chloride 75 mL/hr at 04/02/13 2137    MEDICATIONS  . heparin  5,000 Units Subcutaneous Q8H  . nebivolol  5 mg Oral Daily  . nicotine  14 mg Transdermal Daily  . rosuvastatin  40 mg Oral q1800  . vitamin C  1,000 mg  Oral Daily   PRN:    Diet:  General thin liquids Activity:  Up with assistance DVT Prophylaxis:  Heparin 5000 units sq tid  CLINICALLY SIGNIFICANT STUDIES Basic Metabolic Panel:  Recent Labs Lab 04/02/13 1414 04/02/13 1425 04/02/13 1820 04/03/13 0620  NA 139 142  --  139  K 3.7 3.7  --  3.7  CL 103 103  --  104  CO2 28  --   --  26  GLUCOSE 104* 106*  --  100*  BUN 10 9  --  10  CREATININE 0.86 0.80 0.82 0.79  CALCIUM 9.2  --   --  8.9   Liver Function Tests:  Recent Labs Lab 04/02/13 1414  AST 12  ALT 8  ALKPHOS 94  BILITOT 0.3  PROT 6.9  ALBUMIN 3.4*   CBC:  Recent Labs Lab 04/02/13 1414 04/02/13 1425 04/02/13 1820  WBC 8.2  --  10.6*  NEUTROABS 4.9  --   --   HGB 15.0 15.6 15.9  HCT 43.1 46.0 44.1  MCV 80.4  --  80.5  PLT 184  --  178   Coagulation:  Recent Labs Lab 04/02/13 1414  LABPROT 14.1  INR 1.10   Cardiac Enzymes:  Recent Labs Lab 04/02/13 1417  TROPONINI <0.30   Urinalysis: No results found  for this basename: COLORURINE, APPERANCEUR, LABSPEC, PHURINE, GLUCOSEU, HGBUR, BILIRUBINUR, KETONESUR, PROTEINUR, UROBILINOGEN, NITRITE, LEUKOCYTESUR,  in the last 168 hours Lipid Panel    Component Value Date/Time   CHOL 158 03/17/2013 0415   TRIG 62 03/17/2013 0415   HDL 40 03/17/2013 0415   CHOLHDL 4.0 03/17/2013 0415   VLDL 12 03/17/2013 0415   LDLCALC 106* 03/17/2013 0415   HgbA1C  Lab Results  Component Value Date   HGBA1C 6.0* 03/17/2013    Urine Drug Screen:     Component Value Date/Time   LABOPIA NONE DETECTED 03/17/2013 0619   COCAINSCRNUR NONE DETECTED 03/17/2013 0619   LABBENZ NONE DETECTED 03/17/2013 0619   AMPHETMU NONE DETECTED 03/17/2013 0619   THCU POSITIVE* 03/17/2013 0619   LABBARB NONE DETECTED 03/17/2013 0619    Alcohol Level:  Recent Labs Lab 04/02/13 1414  ETH <11   CT of the brain  04/02/2013   1.  Questionable new sub centimeter hypodensity in the left thalamus, such as due to left thalamic lacunar infarct. Clinical correlation  recommended. 2.  Otherwise stable with interval expected evolution of right basal ganglia lacunar infarct.   MRI of the brain  04/03/2013   1.  Expected evolution of right basal ganglia infarcts without significant progression. 2.  T1 shortening within the right caudate head suggesting micro hemorrhage. 3.  A focal lacunar infarct of the left thalamus has evolved since the prior study, but does not exhibit to restricted diffusion to suggest an acute infarct in the last 14 days.   MRA of the brain  04/03/2013   1.  Moderate small vessel disease. 2.  60% stenosis of the right vertebral artery. 3.  Moderate atherosclerotic irregularity of the cavernous carotid arteries bilaterally.    Physical Exam  Pleasant middle aged caucasian male not in distress.Awake alert. Afebrile. Head is nontraumatic. Neck is supple without bruit. Hearing is normal. Cardiac exam no murmur or gallop. Lungs are clear to auscultation. Distal pulses are well felt.  Neurological exam ; Awake Alert oriented x 3. Normal speech and language.eye movements full without nystagmus.fundi were not visualized. Vision acuity and fields appear normal. Hearing is normal. Palatal moments are normal. Face symmetric. Tongue midline.  Normal strength, tone, reflexes and coordination. Diminished fine finger movements on the left. Orbits right over left upper extremity Normal sensation. Gait deferred.  ASSESSMENT Mr. Luis Salinas is a 67 y.o. male presenting with increased dysarthria in patient with recent right basal ganglia lacune with dysarthria and left hemiparesis. Imaging confirms a new left internal capsule infarct. Infarct felt to be thrombotic secondary to small vessel disease as well.  On clopidogrel 75 mg orally every day prior to admission. Now on clopidogrel 75 mg orally every day for secondary stroke prevention. Patient with resultant left hemiparesis and dysarthria, left facial weakness. Work up completed.   Recent stroke  Cigarette  smoker  Marijuana use Hyperlipidemia, on statin PTA CAD  Hospital day # 1  TREATMENT/PLAN  Change clopidogrel 75 mg orally every day to  dipyridamole SR 250 mg/aspirin 25 mg orally twice a day for secondary stroke prevention. To prevent headache, most common side effect of Aggrenox, will start Aggrenox q hs x 2 weeks then increase Aggrenox to bid.  Until then, aspirin 81 mg q am x 2 weeks, then discontinue. May take Tylenol 650 mg 1 hr prior to Aggrenox for the first week, then discontinue.  Keep follow up appt with Dr. Pearlean Brownie as already scheduled, wife has appt.  Ok  for discharge from stroke standpoint.  Dr. Pearlean Brownie discussed diagnosis, prognosis and plan of care with wife, patient and Dr. Rito Ehrlich.   Annie Main, MSN, RN, ANVP-BC, ANP-BC, [facility] Pager: [phone] 04/03/2013 12:51 PM  I have personally obtained a history, examined the patient, evaluated imaging results, and formulated the assessment and plan of care. I agree with the above. Delia Heady, MD

## 2013-04-03 NOTE — Discharge Summary (Signed)
Physician Discharge Summary  [name] WUJ:[id number] DOB: [birth year] DOA: 04/02/2013  PCP: Phill Myron, NP  Admit date: 04/02/2013 Discharge date: 04/03/2013  Time spent: 25 minutes  Recommendations for Outpatient Follow-up:  1. Patient will discontinue aspirin and Plavix and start Aggrenox. 2. Patient will continue all previous followup appointments as scheduled. 3. Patient will start on Chantix for smoking.  Discharge Diagnoses:  Principal Problem:   Nontraumatic intracerebral hemorrhage Active Problems:   Brain TIA   Hyperlipidemia   CAD in native artery   COPD (chronic obstructive pulmonary disease)   Tobacco abuse   Discharge Condition: Improved, being discharged home  Diet recommendation: Low sodium heart healthy  Filed Weights   04/02/13 1900  Weight: 68.629 kg (151 lb 4.8 oz)    History of present illness:  On 4/20: Luis Salinas is a 67 y.o. male Caucasian male who presented to the emergency room from home 04/02/13 with left-sided weakness and dysarthria. He was fine this morning when he woke however his wife come back from having gone out and had brought lunch she awoke him and she noted was clumsy using utensils to eat and he had a slurred speech. This seems to have resolved rapidly en route. He has been compliant on his Plavix as well as his blood pressure medications had restarted smoking again yesterday.  He states that he feels completely back to his baseline at present. He denies any recent nausea vomiting chest pain dark stool tarry stool per vision double vision. He is hungry but has not passed stroke swallow evaluation yet.   Hospital Course:  Principal Problem:   Nontraumatic intracerebral hemorrhage: Patient had been recently discharged in the last few weeks for stroke and started on aspirin and Plavix. There was a concern initially this was a new stroke. Evaluation of his MRI he was found to have small areas might hemorrhage. In discussion with  stroke service, felt to be secondary to his medication. They recommended discontinuing aspirin and Plavix and started on Aggrenox. By hospital day 2, patient's symptoms were fully resolved. His speech was normalized. He had no confusion. He had full strength in his arms and legs. He is not felt to need any additional PT or OT. He is being discharged home. Active Problems:   Brain TIA   Hyperlipidemia: He will continue on a statin   CAD in native artery   COPD (chronic obstructive pulmonary disease): Stable medical issue.   Tobacco abuse: Patient advised to quit. He is being started on Chantix.   Procedures:  None  Consultations:  Sethi-stroke service  Discharge Exam: Filed Vitals:   04/03/13 0128 04/03/13 0329 04/03/13 0530 04/03/13 1000  BP: 151/78 149/66 145/66 140/72  Pulse: 55 58 53 57  Temp: 97.9 F (36.6 C) 97.7 F (36.5 C) 97.9 F (36.6 C) 97.5 F (36.4 C)  TempSrc: Oral Oral Oral Oral  Resp: 17 18 18 17   Height:      Weight:      SpO2: 93% 97% 95% 100%    General: Alert and oriented x3, no acute distress Cardiovascular: Regular rate and rhythm, S1-S2 Respiratory: Clear to auscultation bilaterally Abdomen: Soft, nontender, nondistended, positive bowel sounds Extremities: No clubbing or cyanosis or edema Neuro: No focal deficits  Discharge Instructions  Discharge Orders   Future Appointments Provider Department Dept Phone   04/17/2013 3:30 PM Rosalio Macadamia, NP [facility] Main Office [place]) [phone]   Joint Appt [facility]  [facility] Main Office [place]) [phone]  07/25/2013 2:45 PM Micki Riley, MD [facility] [phone]   Future Orders Complete By Expires     Diet - low sodium heart healthy  As directed     Increase activity slowly  As directed         Medication List    STOP taking these medications       clopidogrel 75 MG tablet  Commonly known as:  PLAVIX      TAKE these medications        dipyridamole-aspirin 200-25 MG per 12 hr capsule  Commonly known as:  AGGRENOX  1 cap po qhs x 14 days, then 1 cap po bid     nebivolol 5 MG tablet  Commonly known as:  BYSTOLIC  Take 5 mg by mouth daily.     rosuvastatin 40 MG tablet  Commonly known as:  CRESTOR  Take 40 mg by mouth daily.     traMADol 50 MG tablet  Commonly known as:  ULTRAM  Take 50 mg by mouth every 6 (six) hours as needed for pain.     varenicline 0.5 MG tablet  Commonly known as:  CHANTIX  Take 1 tab po daily x 3 days, then po bid after for 11 wks     vitamin C 1000 MG tablet  Take 1,000 mg by mouth daily.          The results of significant diagnostics from this hospitalization (including imaging, microbiology, ancillary and laboratory) are listed below for reference.    Significant Diagnostic Studies: Ct Head Wo Contrast  04/02/2013    IMPRESSION: 1.  Questionable new sub centimeter hypodensity in the left thalamus, such as due to left thalamic lacunar infarct. Clinical correlation recommended. 2.  Otherwise stable with interval expected evolution of right basal ganglia lacunar infarct.  Critical Value/emergent results were called by telephone at the time of interpretation on 04/02/2013 at 1424 hours to Dr. Amada Jupiter, who verbally acknowledged these results.   Original Report Authenticated By: Erskine Speed, M.D.    Ct Head (brain) Wo Contrast  03/16/2013 IMPRESSION: Chronic ischemic changes and atrophy.  Discussed with [name].   Original Report Authenticated By: Jolaine Click, M.D.    Mri Brain Without Contrast  03/17/2013    IMPRESSION: Acute infarction affecting the right basal ganglia and corona radiata.  No evidence of hemorrhage or mass effect.  Chronic small vessel changes affecting the cerebral hemispheric white matter.  Fluid in the mastoid air cells on the right.    Dg Chest Port 1 View  03/16/2013  .  IMPRESSION: Chronic bronchitic changes.  No focal consolidation.   Original Report  Authenticated By: Burman Nieves, M.D.    Mr Mra Head/brain Wo Cm  03/17/2013    IMPRESSION: Negative intracranial MR angiography of the large and medium-sized vessels.   Original Report Authenticated By: Paulina Fusi, M.D.     Microbiology: No results found for this or any previous visit (from the past 240 hour(s)).   Labs: Basic Metabolic Panel:  Recent Labs Lab 04/02/13 1414 04/02/13 1425 04/02/13 1820 04/03/13 0620  NA 139 142  --  139  K 3.7 3.7  --  3.7  CL 103 103  --  104  CO2 28  --   --  26  GLUCOSE 104* 106*  --  100*  BUN 10 9  --  10  CREATININE 0.86 0.80 0.82 0.79  CALCIUM 9.2  --   --  8.9   Liver Function  Tests:  Recent Labs Lab 04/02/13 1414  AST 12  ALT 8  ALKPHOS 94  BILITOT 0.3  PROT 6.9  ALBUMIN 3.4*   CBC:  Recent Labs Lab 04/02/13 1414 04/02/13 1425 04/02/13 1820  WBC 8.2  --  10.6*  NEUTROABS 4.9  --   --   HGB 15.0 15.6 15.9  HCT 43.1 46.0 44.1  MCV 80.4  --  80.5  PLT 184  --  178   Cardiac Enzymes:  Recent Labs Lab 04/02/13 1417  TROPONINI <0.30   CBG:  Recent Labs Lab 04/02/13 1437 04/02/13 1822  GLUCAP 104* 98       Signed:  KRISHNAN,SENDIL K  [facility] 04/03/2013, 4:22 PM

## 2013-04-03 NOTE — Progress Notes (Signed)
Discharge education complete and IV removed.  Prescriptions given and patient being taken home by wife.  Lance Bosch, RN

## 2013-04-03 NOTE — Evaluation (Signed)
Clinical/Bedside Swallow Evaluation Patient Details  Name: Luis Salinas MRN: [id number] Date of Birth: 1946/08/26  Today's Date: 04/03/2013 Time: 6295-2841 SLP Time Calculation (min): 13 min  Past Medical History:  Past Medical History  Diagnosis Date  . Myocardial infarction     2007 [facility]  . Concussion     hx of  . Pneumonia     exposure to caustic chemicals  . Arthritis   . COPD (chronic obstructive pulmonary disease)   . Amnesia     He was found and reuintied with his family in 24 after 27 years.  . Stroke    Past Surgical History:  Past Surgical History  Procedure Laterality Date  . Cardiac catheterization    . Coronary angioplasty    . Tonsillectomy    . Joint replacement      Left knee replacement  . Rotator cuff repair      left  . Colonoscopy w/ polypectomy    . Anterior cervical decomp/discectomy fusion  05/16/2012    Procedure: ANTERIOR CERVICAL DECOMPRESSION/DISCECTOMY FUSION 2 LEVELS;  Surgeon: Barnett Abu, MD;  Location: [facility];  Service: Neurosurgery;  Laterality: N/A;  Cervical Five-Six, Cervical Six-Seven Anterior Cervical Decompression/Diskectomy Fusion   HPI:  Luis Salinas is a 67 y.o. male recently admitted to [facility] on 03/16/2013 with a right basal ganglia lacunar infarct. On 04/02/2013, the patient's wife noted him to be very dysarthric (on the phone). She returned home and was concerned that he was having another stroke. A stat CT scan was performed .The patient's symptoms quickly resolved; although, it was noted that he had a new right facial droop. He was admitted for further evaluation and treatment. New CVA ruled out, question TIA vs. microhemorrhage in areas of caudate head.   Assessment / Plan / Recommendation Clinical Impression  Patient presents with a functional swallow with no overt s/s of aspiration observed at bedside. Pt with recent CVA (2 weeks ago) with no dysphagia documented, however pt failed RN stroke swallow  screen on new admission 4/20. Oral mech exam revealed mild left side labial assymetry, possible residual from previous CVA, however does not negatively effect pt's swallow function. Pt and wife state he is back at baseline and report no additional questions/concerns for SLP regarding pt's swallow. Recommend regular diet, thin liquids, communicated diet recommendation and aspiration precautions with pt and RN. No f/u needed.    Aspiration Risk  Mild    Diet Recommendation Regular;Thin liquid   Liquid Administration via: Cup;Straw Medication Administration: Whole meds with liquid Supervision: Patient able to self feed Compensations: Slow rate;Small sips/bites Postural Changes and/or Swallow Maneuvers: Seated upright 90 degrees    Other  Recommendations Oral Care Recommendations: Oral care BID   Follow Up Recommendations  None    Frequency and Duration        Pertinent Vitals/Pain None reported    SLP Swallow Goals     Swallow Study Prior Functional Status       General Date of Onset: 04/03/13 HPI: Luis Salinas is a 67 y.o. male recently admitted to [facility] on 03/16/2013 with a right basal ganglia lacunar infarct. On 04/02/2013, the patient's wife noted him to be very dysarthric (on the phone). She returned home and was concerned that he was having another stroke. A stat CT scan was performed .The patient's symptoms quickly resolved; although, it was noted that he had a new right facial droop. He was admitted for further evaluation and treatment.  New CVA ruled out, question TIA vs. microhemorrhage in areas of caudate head. Type of Study: Bedside swallow evaluation Previous Swallow Assessment: failed stroke swallow screen Diet Prior to this Study: NPO Temperature Spikes Noted: No Respiratory Status: Room air History of Recent Intubation: No Behavior/Cognition: Alert;Cooperative;Pleasant mood Oral Cavity - Dentition: Adequate natural dentition Self-Feeding Abilities:  Able to feed self Patient Positioning: Upright in bed Baseline Vocal Quality: Clear Volitional Cough: Strong Volitional Swallow: Able to elicit    Oral/Motor/Sensory Function Overall Oral Motor/Sensory Function: Impaired Labial ROM: Reduced left Labial Strength: Reduced Labial Sensation: Within Functional Limits Lingual ROM: Within Functional Limits Lingual Symmetry: Within Functional Limits Lingual Strength: Within Functional Limits Lingual Sensation: Within Functional Limits Facial ROM: Within Functional Limits Facial Symmetry: Within Functional Limits Facial Strength: Within Functional Limits Facial Sensation: Within Functional Limits Velum: Impaired left Mandible: Within Functional Limits   Ice Chips Ice chips: Not tested   Thin Liquid Thin Liquid: Within functional limits Presentation: Cup;Self Fed;Straw    Nectar Thick Nectar Thick Liquid: Not tested   Honey Thick Honey Thick Liquid: Not tested   Puree Puree: Within functional limits Presentation: Self Fed;Spoon   Solid   GO Functional Assessment Tool Used: clinical judgement Functional Limitations: Swallowing Swallow Current Status (Z6109): 0 percent impaired, limited or restricted Swallow Goal Status (U0454): 0 percent impaired, limited or restricted Swallow Discharge Status [phone]): 0 percent impaired, limited or restricted  Solid: Within functional limits Presentation: Self Fed      Luis Salinas SLP student Luis Salinas 04/03/2013,12:40 PM

## 2013-04-17 ENCOUNTER — Encounter: Payer: 59 | Admitting: Nurse Practitioner

## 2013-04-26 ENCOUNTER — Ambulatory Visit: Payer: Medicare Other | Admitting: Cardiology

## 2013-05-01 ENCOUNTER — Ambulatory Visit (INDEPENDENT_AMBULATORY_CARE_PROVIDER_SITE_OTHER): Payer: 59 | Admitting: Family Medicine

## 2013-05-01 ENCOUNTER — Telehealth: Payer: Self-pay | Admitting: Family Medicine

## 2013-05-01 ENCOUNTER — Encounter: Payer: 59 | Admitting: Physician Assistant

## 2013-05-01 ENCOUNTER — Encounter: Payer: Self-pay | Admitting: Family Medicine

## 2013-05-01 VITALS — BP 139/82 | HR 58 | Temp 96.9°F | Ht 68.0 in | Wt 157.6 lb

## 2013-05-01 DIAGNOSIS — I10 Essential (primary) hypertension: Secondary | ICD-10-CM

## 2013-05-01 DIAGNOSIS — Z720 Tobacco use: Secondary | ICD-10-CM

## 2013-05-01 DIAGNOSIS — J449 Chronic obstructive pulmonary disease, unspecified: Secondary | ICD-10-CM

## 2013-05-01 DIAGNOSIS — G459 Transient cerebral ischemic attack, unspecified: Secondary | ICD-10-CM

## 2013-05-01 DIAGNOSIS — E785 Hyperlipidemia, unspecified: Secondary | ICD-10-CM

## 2013-05-01 DIAGNOSIS — I152 Hypertension secondary to endocrine disorders: Secondary | ICD-10-CM | POA: Insufficient documentation

## 2013-05-01 DIAGNOSIS — I2510 Atherosclerotic heart disease of native coronary artery without angina pectoris: Secondary | ICD-10-CM

## 2013-05-01 DIAGNOSIS — I619 Nontraumatic intracerebral hemorrhage, unspecified: Secondary | ICD-10-CM

## 2013-05-01 DIAGNOSIS — F17200 Nicotine dependence, unspecified, uncomplicated: Secondary | ICD-10-CM

## 2013-05-01 LAB — COMPLETE METABOLIC PANEL WITH GFR
ALT: 33 U/L (ref 0–53)
AST: 27 U/L (ref 0–37)
Albumin: 4.2 g/dL (ref 3.5–5.2)
Alkaline Phosphatase: 87 U/L (ref 39–117)
BUN: 15 mg/dL (ref 6–23)
CO2: 28 mEq/L (ref 19–32)
Calcium: 9.6 mg/dL (ref 8.4–10.5)
Chloride: 104 mEq/L (ref 96–112)
Creat: 0.81 mg/dL (ref 0.50–1.35)
GFR, Est African American: >89 mL/min
GFR, Est Non African American: >89 mL/min
Glucose, Bld: 101 mg/dL — ABNORMAL HIGH (ref 70–99)
Potassium: 4.5 mEq/L (ref 3.5–5.3)
Sodium: 139 mEq/L (ref 135–145)
Total Bilirubin: 0.5 mg/dL (ref 0.3–1.2)
Total Protein: 7 g/dL (ref 6.0–8.3)

## 2013-05-01 LAB — POCT CBC
Granulocyte percent: 67.8 %G (ref 37–80)
HCT, POC: 48.3 % (ref 43.5–53.7)
Hemoglobin: 16.7 g/dL (ref 14.1–18.1)
Lymph, poc: 3 (ref 0.6–3.4)
MCH, POC: 28.8 pg (ref 27–31.2)
MCHC: 34.6 g/dL (ref 31.8–35.4)
MCV: 83.3 fL (ref 80–97)
MPV: 7.6 fL (ref 0–99.8)
POC Granulocyte: 7.5 — AB (ref 2–6.9)
POC LYMPH PERCENT: 27.1 %L (ref 10–50)
Platelet Count, POC: 168 10*3/uL (ref 142–424)
RBC: 5.8 M/uL (ref 4.69–6.13)
RDW, POC: 13.6 %
WBC: 11.1 10*3/uL — AB (ref 4.6–10.2)

## 2013-05-01 NOTE — Telephone Encounter (Signed)
APPT MADE

## 2013-05-01 NOTE — Patient Instructions (Addendum)
      Dr Gearl Kimbrough's Recommendations  Diet and Exercise discussed with patient.  For nutrition information, I recommend books:  1).Eat to Live by Dr Joel Fuhrman. 2).Prevent and Reverse Heart Disease by Dr Caldwell Esselstyn. 3) Dr Neal Barnard's Book: Reversing Diabetes  Exercise recommendations are:  If unable to walk, then the patient can exercise in a chair 3 times a day. By flapping arms like a bird gently and raising legs outwards to the front.  If ambulatory, the patient can go for walks for 30 minutes 3 times a week. Then increase the intensity and duration as tolerated.  Goal is to try to attain exercise frequency to 5 times a week.  If applicable: Best to perform resistance exercises (machines or weights) 2 days a week and cardio type exercises 3 days per week.  

## 2013-05-01 NOTE — Progress Notes (Signed)
[name], male   DOB: [birth year], 67 y.o.   MRN: [id number] SUBJECTIVE: HPI: Patient is here for follow up of hyperlipidemia/CVA/CAD/COPD:  denies Headache;denies Chest Pain;denies weakness;denies Shortness of Breath and orthopnea;denies Visual changes;denies palpitations;denies cough;denies pedal edema;denies symptoms of TIA or stroke;deniesClaudication symptoms. admits to Compliance with medications; denies Problems with medications. Doing well. Has smoked on and off recently. The Chantix helped but made him feel off. None of the TIA/CVA symptoms. Diet reviewed and found not optimal for his co-morbid conditions.   PMH/PSH: reviewed/updated in Epic  SH/FH: reviewed/updated in Epic  Allergies: reviewed/updated in Epic  Medications: reviewed/updated in Epic  Immunizations: reviewed/updated in Epic  ROS: As above in the HPI. All other systems are stable or negative.  OBJECTIVE: APPEARANCE:  Patient in no acute distress.The patient appeared well nourished and normally developed. Acyanotic. Waist: VITAL SIGNS:BP 139/82  Pulse 58  Temp(Src) 96.9 F (36.1 C) (Oral)  Ht 5\' 8"  (1.727 m)  Wt 157 lb 9.6 oz (71.487 kg)  BMI 23.97 kg/m2   SKIN: warm and  Dry without overt rashes, tattoos and scars  HEAD and Neck: without JVD, Head and scalp: normal Eyes:No scleral icterus. Fundi normal, eye movements normal. Ears: Auricle normal, canal normal, Tympanic membranes normal, insufflation normal. Nose: normal Throat: normal Neck & thyroid: normal  CHEST & LUNGS: Chest wall: normal Lungs: Clear  CVS: Reveals the PMI to be normally located. Regular rhythm, First and Second Heart sounds are normal,  absence of murmurs, rubs or gallops. Peripheral vasculature: Radial pulses: normal Dorsal pedis pulses: normal Posterior pulses: normal  ABDOMEN:  Appearance: normal Benign,, no organomegaly, no masses, no Abdominal Aortic enlargement. No Guarding , no rebound.  No Bruits. Bowel sounds: normal  RECTAL: N/A GU: N/A  EXTREMETIES: nonedematous. Both Femoral and Pedal pulses are normal.  MUSCULOSKELETAL:  Spine: normal Joints:left knee TKR with 90 degrees max flexion.  NEUROLOGIC: oriented to time,place and person; nonfocal.   ASSESSMENT: CAD (coronary artery disease) - Plan: NMR Lipoprofile with Lipids, POCT CBC  HTN (hypertension) - Plan: COMPLETE METABOLIC PANEL WITH GFR  Tobacco abuse  Hyperlipidemia  Nontraumatic intracerebral hemorrhage  COPD (chronic obstructive pulmonary disease)  Brain TIA  PLAN: Orders Placed This Encounter  Procedures  . NMR Lipoprofile with Lipids  . COMPLETE METABOLIC PANEL WITH GFR  . POCT CBC   Results for orders placed in visit on 05/01/13  POCT CBC      Result Value Range   WBC 11.1 (*) 4.6 - 10.2 K/uL   Lymph, poc 3.0  0.6 - 3.4   POC LYMPH PERCENT 27.1  10 - 50 %L   POC Granulocyte 7.5 (*) 2 - 6.9   Granulocyte percent 67.8  37 - 80 %G   RBC 5.8  4.69 - 6.13 M/uL   Hemoglobin 16.7  14.1 - 18.1 g/dL   HCT, POC 40.9  81.1 - 53.7 %   MCV 83.3  80 - 97 fL   MCH, POC 28.8  27 - 31.2 pg   MCHC 34.6  31.8 - 35.4 g/dL   RDW, POC 91.4     Platelet Count, POC 168.0  142 - 424 K/uL   MPV 7.6  0 - 99.8 fL   No orders of the defined types were placed in this encounter.        Dr Woodroe Mode Recommendations  Diet and Exercise discussed with patient.  For nutrition information, I recommend books:  1).Eat to Live by Dr Monico Hoar.  2).Prevent and Reverse Heart Disease by Dr Suzzette Righter. 3) Dr Katherina Right Book: Reversing Diabetes  Exercise recommendations are:  If unable to walk, then the patient can exercise in a chair 3 times a day. By flapping arms like a bird gently and raising legs outwards to the front.  If ambulatory, the patient can go for walks for 30 minutes 3 times a week. Then increase the intensity and duration as tolerated.  Goal is to try to attain exercise  frequency to 5 times a week.  If applicable: Best to perform resistance exercises (machines or weights) 2 days a week and cardio type exercises 3 days per week.  RTc 2 months.  Spent 45 minutes counselling on major LTC needed to improve survival.  Luis Salinas P. Modesto Charon, M.D.

## 2013-05-04 ENCOUNTER — Telehealth: Payer: Self-pay | Admitting: Family Medicine

## 2013-05-05 LAB — NMR LIPOPROFILE WITH LIPIDS
Cholesterol, Total: 114 mg/dL (ref <200)
HDL Particle Number: 28.7 umol/L — ABNORMAL LOW (ref >=30.5)
HDL Size: 9.4 nm (ref >=9.2)
HDL-C: 34 mg/dL — ABNORMAL LOW (ref >=40)
LDL (calc): 52 mg/dL (ref <100)
LDL Particle Number: 761 nmol/L (ref <1000)
LDL Size: 20 nm — ABNORMAL LOW (ref >20.5)
LP-IR Score: 36 (ref <=45)
Large HDL-P: 5.9 umol/L (ref >=4.8)
Large VLDL-P: 1.5 nmol/L (ref <=2.7)
Small LDL Particle Number: 458 nmol/L (ref <=527)
Triglycerides: 141 mg/dL (ref <150)
VLDL Size: 44.4 nm (ref <=46.6)

## 2013-05-05 NOTE — Progress Notes (Signed)
Quick Note:  Labs abnormal.WBC mildly elevated. Needs a repeat CBC with a peripheral smear. He needs to stop smoking. The rest of the labs are at goal for now. ______

## 2013-05-05 NOTE — Telephone Encounter (Signed)
Dr Modesto Charon i cant see were these labs have been reviewed by you yet  Please review and your nurse call his wife- she states its ok to leave a mess on their VM.-jhb

## 2013-05-05 NOTE — Telephone Encounter (Signed)
See note in lab results. Needs to repeat the CBC with a peripheral smear. FW

## 2013-05-08 ENCOUNTER — Other Ambulatory Visit (HOSPITAL_COMMUNITY): Payer: Self-pay | Admitting: Family Medicine

## 2013-05-09 NOTE — Telephone Encounter (Signed)
PT AWARE  

## 2013-05-11 ENCOUNTER — Other Ambulatory Visit (INDEPENDENT_AMBULATORY_CARE_PROVIDER_SITE_OTHER): Payer: 59

## 2013-05-11 DIAGNOSIS — R7989 Other specified abnormal findings of blood chemistry: Secondary | ICD-10-CM

## 2013-05-11 DIAGNOSIS — D72829 Elevated white blood cell count, unspecified: Secondary | ICD-10-CM

## 2013-05-11 LAB — CBC WITH DIFFERENTIAL/PLATELET
Basophils Absolute: 0.1 10*3/uL (ref 0.0–0.1)
Basophils Relative: 1 % (ref 0–1)
Eosinophils Absolute: 0.2 10*3/uL (ref 0.0–0.7)
Eosinophils Relative: 2 % (ref 0–5)
HCT: 45 % (ref 39.0–52.0)
Hemoglobin: 15.4 g/dL (ref 13.0–17.0)
Lymphocytes Relative: 28 % (ref 12–46)
Lymphs Abs: 2.9 10*3/uL (ref 0.7–4.0)
MCH: 28.4 pg (ref 26.0–34.0)
MCHC: 34.2 g/dL (ref 30.0–36.0)
MCV: 83 fL (ref 78.0–100.0)
Monocytes Absolute: 0.8 10*3/uL (ref 0.1–1.0)
Monocytes Relative: 8 % (ref 3–12)
Neutro Abs: 6.2 10*3/uL (ref 1.7–7.7)
Neutrophils Relative %: 61 % (ref 43–77)
Platelets: 174 10*3/uL (ref 150–400)
RBC: 5.42 MIL/uL (ref 4.22–5.81)
RDW: 14.8 % (ref 11.5–15.5)
WBC: 10.2 10*3/uL (ref 4.0–10.5)

## 2013-05-11 NOTE — Progress Notes (Signed)
Patient came in for labs only.

## 2013-05-12 LAB — MANUAL DIFFERENTIAL
Atypical Lymphocytes Manual: 3 % — ABNORMAL HIGH
Bands Manual: 3 % (ref 0–5)
Basophils Manual: 3 % — ABNORMAL HIGH (ref 0–1)
Blasts Manual: 0 %
Eosinophils Manual: 3 % (ref 0–5)
Lymphocytes Manual: 29 % (ref 12–46)
Metamyelocytes Manual: 0 %
Monocytes Manual: 3 % (ref 3–12)
Myelocytes Manual: 0 %
Neutrophils Manual: 56 % (ref 43–77)

## 2013-05-15 ENCOUNTER — Other Ambulatory Visit: Payer: Self-pay

## 2013-05-15 MED ORDER — NEBIVOLOL HCL 5 MG PO TABS: 5.0000 mg | ORAL_TABLET | Freq: Every day | ORAL | Status: DC

## 2013-05-15 NOTE — Progress Notes (Signed)
Quick Note:  Call patient. Labs normal.may have had a recent viral infection. But ok now. No change in plan. ______

## 2013-06-02 ENCOUNTER — Other Ambulatory Visit: Payer: Self-pay

## 2013-06-02 MED ORDER — ASPIRIN-DIPYRIDAMOLE ER 25-200 MG PO CP12: 1.0000 | ORAL_CAPSULE | Freq: Two times a day (BID) | ORAL | Status: DC

## 2013-06-18 ENCOUNTER — Other Ambulatory Visit: Payer: Self-pay | Admitting: Neurology

## 2013-07-05 ENCOUNTER — Ambulatory Visit: Payer: 59 | Admitting: Family Medicine

## 2013-07-19 ENCOUNTER — Other Ambulatory Visit: Payer: Self-pay

## 2013-07-25 ENCOUNTER — Ambulatory Visit (INDEPENDENT_AMBULATORY_CARE_PROVIDER_SITE_OTHER): Payer: Medicare Other | Admitting: Nurse Practitioner

## 2013-07-25 ENCOUNTER — Encounter: Payer: Self-pay | Admitting: Nurse Practitioner

## 2013-07-25 VITALS — BP 132/63 | HR 58 | Ht 69.0 in | Wt 164.0 lb

## 2013-07-25 DIAGNOSIS — E785 Hyperlipidemia, unspecified: Secondary | ICD-10-CM

## 2013-07-25 DIAGNOSIS — Z9861 Coronary angioplasty status: Secondary | ICD-10-CM

## 2013-07-25 DIAGNOSIS — I639 Cerebral infarction, unspecified: Secondary | ICD-10-CM

## 2013-07-25 DIAGNOSIS — Z955 Presence of coronary angioplasty implant and graft: Secondary | ICD-10-CM

## 2013-07-25 DIAGNOSIS — I6350 Cerebral infarction due to unspecified occlusion or stenosis of unspecified cerebral artery: Secondary | ICD-10-CM

## 2013-07-25 NOTE — Progress Notes (Signed)
[facility]  PATIENT: Luis Salinas DOB: [birth year]   HISTORY FROM: patient, chart REASON FOR VISIT: routine follow up  HISTORY OF PRESENT ILLNESS:  Luis Salinas is an 67 y.o. male who was recently admitted to [facility] on 03/16/2013 with a right basal ganglia lacunar infarct. The patient initially presented with dysarthria and left-sided weakness. His symptoms improved and he was discharged on 03/17/2013. On 04/02/2013, the patient's wife went to church leaving the patient at home. She last saw him normal at 10:30 AM. She called him around 12:30 on her way home from church and noted him to be very dysarthric. She returned home and was concerned that he was having another stroke. EMS was summoned and the patient was brought to the emergency department. A stat CT scan was performed. The patient's symptoms were quickly resolving; although, it was noted that he had a new right facial droop. TPA was not a consideration in view of the patient's recent CVA and his rapid improvement. The patient had previously been discharged on Plavix 75 mg daily and he states that he had been taking it every day as instructed. The patient did admit to resuming tobacco use yesterday.   UPDATE 07/25/13 (LL): Patient comes to office for stroke follow up.  States he has no weakness or residual deficits other than fatigue and sometimes loses his train of thought.  He says at times his speech is slightly slurred when tired.  His wife is religiously helping him take his medications.  He is taking Aggrenox twice daily.  Patient denies medication side effects, with no signs of bleeding or excessive bruising.  He did not need PT or OT.  REVIEW OF SYSTEMS: Full 14 system review of systems performed and notable only for: constitutional: Fatigue  cardiovascular: Chest pain respiratory: Short of breath, snoring endocrine: N/A  Ear/nose/throat: Ringing in ears  Hematology/Lymph: easy bleeding,  easy bruising Musculoskeletal: Joint pain, cramps, aching muscles skin: Itching genitourinary: Impotence Gastrointestinal: N/A allergy/immunology: N/A neurological: Memory loss, confusion, headache, weakness, slurred speech sleep: Insomnia, sleepiness, snoring, restless legs psychiatric: Decreased energy, change in appetite, disinterest in activities   ALLERGIES: No Known Allergies  HOME MEDICATIONS: Outpatient Prescriptions Prior to Visit  Medication Sig Dispense Refill  . Ascorbic Acid (VITAMIN C) 1000 MG tablet Take 1,000 mg by mouth daily.      . traMADol (ULTRAM) 50 MG tablet Take 50 mg by mouth every 6 (six) hours as needed for pain.      . varenicline (CHANTIX) 0.5 MG tablet Take 1 tab po daily x 3 days, then po bid after for 11 wks  150 tablet  0  . CRESTOR 40 MG tablet TAKE 1 TABLET (40 MG TOTAL) BY MOUTH DAILY.  90 tablet  0  . dipyridamole-aspirin (AGGRENOX) 200-25 MG per 12 hr capsule Take 1 capsule by mouth 2 (two) times daily.  180 capsule  0  . nebivolol (BYSTOLIC) 5 MG tablet Take 1 tablet (5 mg total) by mouth daily.  30 tablet  5   No facility-administered medications prior to visit.    PAST MEDICAL HISTORY: Past Medical History  Diagnosis Date  . Myocardial infarction     2007 [facility]  . Concussion     hx of  . Pneumonia     exposure to caustic chemicals  . Arthritis   . COPD (chronic obstructive pulmonary disease)   . Amnesia     He was found and reuintied with his family in  1992 after 27 years.  . Stroke     PAST SURGICAL HISTORY: Past Surgical History  Procedure Laterality Date  . Cardiac catheterization    . Coronary angioplasty    . Tonsillectomy    . Joint replacement      Left knee replacement  . Rotator cuff repair      left  . Colonoscopy w/ polypectomy    . Anterior cervical decomp/discectomy fusion  05/16/2012    Procedure: ANTERIOR CERVICAL DECOMPRESSION/DISCECTOMY FUSION 2 LEVELS;  Surgeon: Barnett Abu, MD;  Location: [facility];   Service: Neurosurgery;  Laterality: N/A;  Cervical Five-Six, Cervical Six-Seven Anterior Cervical Decompression/Diskectomy Fusion    FAMILY HISTORY: Family History  Problem Relation Age of Onset  . Anesthesia problems Neg Hx   . Hypotension Neg Hx   . Malignant hyperthermia Neg Hx   . Pseudochol deficiency Neg Hx   . Heart disease Mother   . COPD Father     SOCIAL HISTORY: History   Social History  . Marital Status: Married    Spouse Name: N/A    Number of Children: 1  . Years of Education: college   Occupational History  . Not on file.   Social History Main Topics  . Smoking status: Former Smoker    Types: Cigars    Quit date: 04/03/2013  . Smokeless tobacco: Not on file     Comment: Smoked cigars for years after quitting cigarettes.    . Alcohol Use: No  . Drug Use: Yes    Special: Marijuana  . Sexually Active: Yes   Other Topics Concern  . Not on file   Social History Narrative   Lives with his fourth wife.      PHYSICAL EXAM  Filed Vitals:   07/25/13 1531  BP: 132/63  Pulse: 58  Height: 5\' 9"  (1.753 m)  Weight: 164 lb (74.39 kg)   Body mass index is 24.21 kg/(m^2).  Generalized: In no acute distress   Neck: Supple, no carotid bruits   Cardiac: Regular rate rhythm, no murmur   Pulmonary: Clear to auscultation bilaterally   Musculoskeletal: No deformity   Neurological examination   Mentation: Alert oriented to time, place, history taking, language fluent, and causual conversation  Cranial nerve II-XII: Pupils were equal round reactive to light extraocular movements were full, visual field were full on confrontational test. facial sensation and strength were normal. hearing was intact to finger rubbing bilaterally. Uvula tongue midline. head turning and shoulder shrug and were normal and symmetric.Tongue protrusion into cheek strength was normal. MOTOR: normal bulk and tone, full strength in the BUE, BLE, Diminished fine finger movements on the  left. Orbits right over left upper extremity, no pronator drift SENSORY: normal and symmetric to light touch, pinprick, temperature, vibration and proprioception COORDINATION: finger-nose-finger, heel-to-shin bilaterally, there was no truncal ataxia REFLEXES: Brachioradialis 2/2, biceps 2/2, triceps 2/2, patellar 2/2, Achilles 2/2, plantar responses were flexor bilaterally. GAIT/STATION: Rising up from seated position without assistance, normal stance, without trunk ataxia, moderate stride, good arm swing, smooth turning, able to perform tiptoe, and heel walking without difficulty.    DIAGNOSTIC DATA (LABS, IMAGING, TESTING) - I reviewed patient records, labs, notes, testing and imaging myself where available.  Lab Results  Component Value Date   WBC 10.2 05/11/2013   HGB 15.4 05/11/2013   HCT 45.0 05/11/2013   MCV 83.0 05/11/2013   PLT 174 05/11/2013      Component Value Date/Time   NA 139 05/01/2013 1302  K 4.5 05/01/2013 1302   CL 104 05/01/2013 1302   CO2 28 05/01/2013 1302   GLUCOSE 101* 05/01/2013 1302   BUN 15 05/01/2013 1302   CREATININE 0.81 05/01/2013 1302   CREATININE 0.79 04/03/2013 0620   CALCIUM 9.6 05/01/2013 1302   PROT 7.0 05/01/2013 1302   ALBUMIN 4.2 05/01/2013 1302   AST 27 05/01/2013 1302   ALT 33 05/01/2013 1302   ALKPHOS 87 05/01/2013 1302   BILITOT 0.5 05/01/2013 1302   GFRNONAA >90 04/03/2013 0620   GFRAA >90 04/03/2013 0620   Lab Results  Component Value Date   CHOL 158 03/17/2013   HDL 40 03/17/2013   LDLCALC 52 05/01/2013   TRIG 141 05/01/2013   CHOLHDL 4.0 03/17/2013   Lab Results  Component Value Date   HGBA1C 6.0* 03/17/2013   No results found for this basename: VITAMINB12   No results found for this basename: TSH   CT of the brain 04/02/2013 Questionable new sub centimeter hypodensity in the left thalamus, such as due to left thalamic lacunar infarct. Clinical correlation recommended. 2. Otherwise stable with interval expected evolution of right basal ganglia  lacunar infarct.  MRI of the brain 04/03/2013 Expected evolution of right basal ganglia infarcts without significant progression.  T1 shortening within the right caudate head suggesting micro hemorrhage.  A focal lacunar infarct of the left thalamus has evolved since the prior study, but does not exhibit to restricted diffusion to suggest an acute infarct in the last 14 days.  MRA of the brain 04/03/2013 Moderate small vessel disease.  60% stenosis of the right vertebral artery. Moderate atherosclerotic irregularity of the cavernous carotid arteries bilaterally.   ASSESSMENT AND PLAN Luis Salinas is a 67 y.o. Male with two right basal ganglia lacunar infarcts in April 2014, presenting with dysarthria and left hemiparesis.  Infarcts felt to be thrombotic secondary to small vessel disease.  Patient with no residual deficits.  Vascular risk factors of hypertension, hyperlipidemia, former smoker.   Continue dipyridamole SR 250 mg/aspirin 25 mg orally twice a day  for secondary stroke prevention and maintain strict control of hypertension with blood pressure goal below 130/90, and lipids with LDL cholesterol goal below 100 mg/dL.   Followup in 6 months.  Niomie Englert NP-C 07/25/2013, 3:41 PM  [facility] [address] [place], [place] [address] [phone]

## 2013-07-25 NOTE — Patient Instructions (Addendum)
Continue dipyridamole SR 250 mg/aspirin 25 mg orally twice a day  for secondary stroke prevention and maintain strict control of hypertension with blood pressure goal below 130/90, diabetes with hemoglobin A1c goal below 6.5% and lipids with LDL cholesterol goal below 100 mg/d.  Followup in 6 months.   STROKE/TIA INSTRUCTIONS SMOKING Cigarette smoking nearly doubles your risk of having a stroke & is the single most alterable risk factor  If you smoke or have smoked in the last 12 months, you are advised to quit smoking for your health.  Most of the excess cardiovascular risk related to smoking disappears within a year of stopping.  Ask you doctor about anti-smoking medications  [place] Quit Line: 1-800-QUIT NOW  Free Smoking Cessation Classes ([phone]  CHOLESTEROL Know your levels; limit fat & cholesterol in your diet  Lab Results  Component Value Date   CHOL 158 03/17/2013   HDL 40 03/17/2013   LDLCALC 52 05/01/2013   TRIG 141 05/01/2013   CHOLHDL 4.0 03/17/2013      Many patients benefit from treatment even if their cholesterol is at goal.  Goal: Total Cholesterol less than 160  Goal:  LDL less than 100  Goal:  HDL greater than 40  Goal:  Triglycerides less than 150  BLOOD PRESSURE American Stroke Association blood pressure target is less that 120/80 mm/Hg  Your discharge blood pressure is:  BP: 132/63 mmHg  Monitor your blood pressure  Limit your salt and alcohol intake  Many individuals will require more than one medication for high blood pressure  DIABETES (A1c is a blood sugar average for last 3 months) Goal A1c is under 7% (A1c is blood sugar average for last 3 months)  Diabetes: No known diagnosis of diabetes    Lab Results  Component Value Date   HGBA1C 6.0* 03/17/2013    Your A1c can be lowered with medications, healthy diet, and exercise.  Check your blood sugar as directed by your physician  Call your physician if you experience unexplained or low blood sugars.   PHYSICAL ACTIVITY/REHABILITATION Goal is 30 minutes at least 4 days per week    Activity decreases your risk of heart attack and stroke and makes your heart stronger.  It helps control your weight and blood pressure; helps you relax and can improve your mood.  Participate in a regular exercise program.  Talk with your doctor about the best form of exercise for you (dancing, walking, swimming, cycling).  DIET/WEIGHT Goal is to maintain a healthy weight  Your height is:  Height: 5\' 9"  (175.3 cm) Your current weight is: Weight: 164 lb (74.39 kg) Your body Mass Index (BMI) is:  BMI (Calculated): 24.3  Following the type of diet specifically designed for you will help prevent another stroke.  Your goal Body Mass Index (BMI) is 19-24.  Healthy food habits can help reduce 3 risk factors for stroke:  High cholesterol, hypertension, and excess weight.

## 2013-08-16 ENCOUNTER — Encounter: Payer: Self-pay | Admitting: General Practice

## 2013-08-16 ENCOUNTER — Ambulatory Visit (INDEPENDENT_AMBULATORY_CARE_PROVIDER_SITE_OTHER): Payer: Medicare Other | Admitting: General Practice

## 2013-08-16 ENCOUNTER — Telehealth: Payer: Self-pay | Admitting: Family Medicine

## 2013-08-16 VITALS — BP 134/77 | HR 55 | Temp 96.8°F | Ht 69.0 in | Wt 165.0 lb

## 2013-08-16 DIAGNOSIS — R531 Weakness: Secondary | ICD-10-CM

## 2013-08-16 DIAGNOSIS — J449 Chronic obstructive pulmonary disease, unspecified: Secondary | ICD-10-CM

## 2013-08-16 DIAGNOSIS — R5381 Other malaise: Secondary | ICD-10-CM

## 2013-08-16 LAB — POCT CBC
Hemoglobin: 15.7 g/dL (ref 14.1–18.1)
Lymph, poc: 3.8 — AB (ref 0.6–3.4)
MCHC: 33.5 g/dL (ref 31.8–35.4)
MPV: 7.6 fL (ref 0–99.8)
POC Granulocyte: 7 — AB (ref 2–6.9)
Platelet Count, POC: 184 10*3/uL (ref 142–424)
RBC: 5.5 M/uL (ref 4.69–6.13)

## 2013-08-16 NOTE — Progress Notes (Signed)
  Subjective:    [name], male    DOB: [birth year], 67 y.o.   MRN: [id number]  HPI Patient presents today with complaints of weakness and pain in legs. He reports having two strokes in April 2014 (three weeks apart) and weakness/pain has been present since then. He reports symptoms have worsened. He reports starting aggrenox after his stroke. He is accompanied by his wife. He was seen for follow up by [facility] three weeks ago.    Review of Systems  Constitutional: Negative for fever and chills.  HENT: Negative for neck pain and neck stiffness.   Respiratory: Positive for shortness of breath. Negative for chest tightness and wheezing.   Cardiovascular: Negative for chest pain.  Gastrointestinal: Negative for nausea, vomiting, abdominal pain, diarrhea and blood in stool.  Musculoskeletal:       Pain in legs   Neurological: Positive for weakness. Negative for dizziness and headaches.       Objective:   Physical Exam  Constitutional: He is oriented to person, place, and time. He appears well-developed and well-nourished.  HENT:  Head: Normocephalic and atraumatic.  Right Ear: External ear normal.  Left Ear: External ear normal.  Nose: Nose normal.  Mouth/Throat: Oropharynx is clear and moist.  Eyes: Pupils are equal, round, and reactive to light.  Neck: Normal range of motion. Neck supple. No thyromegaly present.  Cardiovascular: Normal heart sounds.  Bradycardia present.   Pulmonary/Chest: Effort normal and breath sounds normal.  Abdominal: Soft. Bowel sounds are normal.  Lymphadenopathy:    He has no cervical adenopathy.  Neurological: He is alert and oriented to person, place, and time.  Skin: Skin is warm and dry.  Psychiatric: He has a normal mood and affect.          Assessment & Plan:  1. COPD (chronic obstructive pulmonary disease)  2. Weakness - POCT CBC - Vitamin B12 - Thyroid Panel With TSH - CMP14+EGFR -maintain follow up  appointments with [facility] -RTO if symptoms worsen or unresolved -Patient verbalized understanding -Coralie Keens, FNP-C

## 2013-08-16 NOTE — Telephone Encounter (Signed)
appt given for today 

## 2013-08-17 LAB — THYROID PANEL WITH TSH: T3 Uptake Ratio: 27 % (ref 24–39)

## 2013-08-17 LAB — CMP14+EGFR
AST: 50 IU/L — ABNORMAL HIGH (ref 0–40)
Albumin/Globulin Ratio: 2.2 (ref 1.1–2.5)
Alkaline Phosphatase: 90 IU/L (ref 39–117)
BUN/Creatinine Ratio: 13 (ref 10–22)
CO2: 26 mmol/L (ref 18–29)
Creatinine, Ser: 0.98 mg/dL (ref 0.76–1.27)
GFR calc Af Amer: 92 mL/min/{1.73_m2} (ref >59)
Globulin, Total: 2 g/dL (ref 1.5–4.5)
Sodium: 139 mmol/L (ref 134–144)
Total Bilirubin: 0.5 mg/dL (ref 0.0–1.2)

## 2013-08-17 LAB — VITAMIN B12: Vitamin B-12: 480 pg/mL (ref 211–946)

## 2013-08-21 ENCOUNTER — Encounter: Payer: Self-pay | Admitting: Family Medicine

## 2013-08-22 ENCOUNTER — Telehealth: Payer: Self-pay | Admitting: General Practice

## 2013-08-23 NOTE — Telephone Encounter (Signed)
Pt aware and results in My Chart.

## 2013-09-04 ENCOUNTER — Other Ambulatory Visit: Payer: Self-pay | Admitting: Family Medicine

## 2013-09-16 ENCOUNTER — Other Ambulatory Visit: Payer: Self-pay | Admitting: Neurology

## 2013-09-18 ENCOUNTER — Other Ambulatory Visit: Payer: Self-pay | Admitting: Neurology

## 2013-10-17 ENCOUNTER — Other Ambulatory Visit: Payer: Self-pay

## 2013-10-17 NOTE — Telephone Encounter (Signed)
Last seen 08/16/13  Mae  If approved route to nurse to print

## 2013-10-19 ENCOUNTER — Other Ambulatory Visit: Payer: Self-pay | Admitting: General Practice

## 2013-10-19 ENCOUNTER — Other Ambulatory Visit: Payer: Self-pay

## 2013-10-20 ENCOUNTER — Other Ambulatory Visit: Payer: Self-pay | Admitting: Nurse Practitioner

## 2013-10-23 NOTE — Telephone Encounter (Signed)
Last seen 08/16/13  Luis Salinas  If approved print and route to nurse

## 2013-10-27 ENCOUNTER — Other Ambulatory Visit: Payer: Self-pay | Admitting: *Deleted

## 2013-10-27 ENCOUNTER — Telehealth: Payer: Self-pay | Admitting: General Practice

## 2013-10-27 NOTE — Telephone Encounter (Signed)
Last filled 04/08/13, last seen 08/16/13. Rx will print

## 2013-10-28 ENCOUNTER — Other Ambulatory Visit: Payer: Self-pay | Admitting: General Practice

## 2013-10-28 NOTE — Telephone Encounter (Signed)
Original prescribed will need to write script for tramadol

## 2013-10-30 NOTE — Telephone Encounter (Signed)
Aware. 

## 2013-11-22 ENCOUNTER — Telehealth: Payer: Self-pay | Admitting: General Practice

## 2013-11-22 MED ORDER — NEBIVOLOL HCL 5 MG PO TABS: 5.0000 mg | ORAL_TABLET | Freq: Every day | ORAL | Status: DC

## 2013-11-22 NOTE — Telephone Encounter (Signed)
done

## 2013-12-08 ENCOUNTER — Telehealth: Payer: Self-pay | Admitting: Family Medicine

## 2013-12-11 MED ORDER — ASPIRIN-DIPYRIDAMOLE ER 25-200 MG PO CP12: 1.0000 | ORAL_CAPSULE | Freq: Two times a day (BID) | ORAL | Status: DC

## 2014-01-04 NOTE — Telephone Encounter (Signed)
Patient was to start on 12/29 approved by MMM

## 2014-01-08 ENCOUNTER — Encounter: Payer: Self-pay | Admitting: General Practice

## 2014-01-08 ENCOUNTER — Ambulatory Visit (INDEPENDENT_AMBULATORY_CARE_PROVIDER_SITE_OTHER): Payer: Medicare Other | Admitting: General Practice

## 2014-01-08 VITALS — BP 127/71 | HR 63 | Temp 98.2°F | Ht 69.0 in | Wt 164.5 lb

## 2014-01-08 DIAGNOSIS — R7989 Other specified abnormal findings of blood chemistry: Secondary | ICD-10-CM

## 2014-01-08 DIAGNOSIS — Z7902 Long term (current) use of antithrombotics/antiplatelets: Secondary | ICD-10-CM

## 2014-01-08 DIAGNOSIS — E785 Hyperlipidemia, unspecified: Secondary | ICD-10-CM

## 2014-01-08 DIAGNOSIS — I10 Essential (primary) hypertension: Secondary | ICD-10-CM

## 2014-01-08 DIAGNOSIS — M549 Dorsalgia, unspecified: Secondary | ICD-10-CM

## 2014-01-08 LAB — POCT CBC
GRANULOCYTE PERCENT: 64.7 % (ref 37–80)
HEMATOCRIT: 47 % (ref 43.5–53.7)
Hemoglobin: 15.7 g/dL (ref 14.1–18.1)
Lymph, poc: 3.1 (ref 0.6–3.4)
MCH, POC: 28.8 pg (ref 27–31.2)
MCHC: 33.5 g/dL (ref 31.8–35.4)
MCV: 85.8 fL (ref 80–97)
MPV: 9.3 fL (ref 0–99.8)
POC Granulocyte: 6.2 (ref 2–6.9)
POC LYMPH PERCENT: 32.7 %L (ref 10–50)
Platelet Count, POC: 170 10*3/uL (ref 142–424)
RBC: 5.5 M/uL (ref 4.69–6.13)
RDW, POC: 13.6 %
WBC: 9.6 10*3/uL (ref 4.6–10.2)

## 2014-01-08 MED ORDER — NEBIVOLOL HCL 5 MG PO TABS: 5.0000 mg | ORAL_TABLET | Freq: Every day | ORAL | Status: DC

## 2014-01-08 MED ORDER — ROSUVASTATIN CALCIUM 40 MG PO TABS: 40.0000 mg | ORAL_TABLET | Freq: Every day | ORAL | Status: DC

## 2014-01-08 MED ORDER — ASPIRIN-DIPYRIDAMOLE ER 25-200 MG PO CP12: 1.0000 | ORAL_CAPSULE | Freq: Two times a day (BID) | ORAL | Status: DC

## 2014-01-08 NOTE — Progress Notes (Signed)
   Subjective:    [name], male    DOB: 27-Apr-1946, 68 y.o.   MRN: [id number]  HPI Patient presents today for chronic health follow up. History of HTN, HLD, and COPD. He reports having some muscle cramps periodically. Also having leg and back pain, periodically and has taken tramadol in the past.     Review of Systems  Constitutional: Negative for fever and chills.  Respiratory: Negative for chest tightness and shortness of breath.   Cardiovascular: Negative for chest pain and palpitations.  Genitourinary: Negative for dysuria, hematuria and difficulty urinating.  Neurological: Negative for dizziness and headaches.       Objective:   Physical Exam  Constitutional: He is oriented to person, place, and time. He appears well-developed and well-nourished.  Cardiovascular: Normal rate, regular rhythm and normal heart sounds.   Pulmonary/Chest: Effort normal and breath sounds normal. No respiratory distress. He exhibits no tenderness.  Neurological: He is alert and oriented to person, place, and time.  Skin: Skin is warm and dry.  Psychiatric: He has a normal mood and affect.          Assessment & Plan:  1. HTN (hypertension)  - CMP14+EGFR - nebivolol (BYSTOLIC) 5 MG tablet; Take 1 tablet (5 mg total) by mouth daily.  Dispense: 90 tablet; Refill: 1  2. HLD (hyperlipidemia)  - Lipid panel - rosuvastatin (CRESTOR) 40 MG tablet; Take 1 tablet (40 mg total) by mouth daily.  Dispense: 90 tablet; Refill: 1  3. Abnormal CBC  - POCT CBC  4. Antiplatelet or antithrombotic long-term use  - nebivolol (BYSTOLIC) 5 MG tablet; Take 1 tablet (5 mg total) by mouth daily.  Dispense: 90 tablet; Refill: 1  5. Back pain -discussed referral to neurosurgeon for follow up on back pain (back surgery in 05/2012) -discussed pain management clinic -patient declined above mentioned in reference to back pain -RTO if symptoms worsen, may seek emergency medical treatment Continue  all current medications Labs pending F/u in 3 months Discussed benefits of regular exercise and healthy eating Patient verbalized understanding Erby Pian, FNP-C

## 2014-01-09 LAB — CMP14+EGFR
ALT: 27 IU/L (ref 0–44)
AST: 23 IU/L (ref 0–40)
Albumin/Globulin Ratio: 1.5 (ref 1.1–2.5)
Albumin: 4.1 g/dL (ref 3.6–4.8)
Alkaline Phosphatase: 95 IU/L (ref 39–117)
BUN/Creatinine Ratio: 11 (ref 10–22)
BUN: 11 mg/dL (ref 8–27)
CO2: 23 mmol/L (ref 18–29)
Calcium: 9 mg/dL (ref 8.6–10.2)
Chloride: 103 mmol/L (ref 97–108)
Creatinine, Ser: 1.01 mg/dL (ref 0.76–1.27)
GFR calc Af Amer: 88 mL/min/1.73 (ref >59)
GFR calc non Af Amer: 76 mL/min/1.73 (ref >59)
Globulin, Total: 2.8 g/dL (ref 1.5–4.5)
Glucose: 85 mg/dL (ref 65–99)
Potassium: 4.7 mmol/L (ref 3.5–5.2)
Sodium: 142 mmol/L (ref 134–144)
Total Bilirubin: 0.2 mg/dL (ref 0.0–1.2)
Total Protein: 6.9 g/dL (ref 6.0–8.5)

## 2014-01-09 LAB — LIPID PANEL
Chol/HDL Ratio: 2.8 ratio (ref 0.0–5.0)
Cholesterol, Total: 101 mg/dL (ref 100–199)
HDL: 36 mg/dL — ABNORMAL LOW (ref >39)
LDL Calculated: 39 mg/dL (ref 0–99)
Triglycerides: 130 mg/dL (ref 0–149)
VLDL Cholesterol Cal: 26 mg/dL (ref 5–40)

## 2014-01-16 ENCOUNTER — Telehealth: Payer: Self-pay | Admitting: Family Medicine

## 2014-01-16 ENCOUNTER — Other Ambulatory Visit: Payer: Self-pay | Admitting: Family Medicine

## 2014-01-16 NOTE — Telephone Encounter (Signed)
Patients wife aware of labs 

## 2014-01-25 ENCOUNTER — Ambulatory Visit (INDEPENDENT_AMBULATORY_CARE_PROVIDER_SITE_OTHER): Payer: Medicare Other | Admitting: Nurse Practitioner

## 2014-01-25 ENCOUNTER — Encounter: Payer: Self-pay | Admitting: Nurse Practitioner

## 2014-01-25 VITALS — BP 146/76 | HR 57 | Ht 69.0 in | Wt 168.0 lb

## 2014-01-25 DIAGNOSIS — R413 Other amnesia: Secondary | ICD-10-CM

## 2014-01-25 DIAGNOSIS — I6350 Cerebral infarction due to unspecified occlusion or stenosis of unspecified cerebral artery: Secondary | ICD-10-CM

## 2014-01-25 DIAGNOSIS — I639 Cerebral infarction, unspecified: Secondary | ICD-10-CM

## 2014-01-25 NOTE — Progress Notes (Signed)
PATIENT: Luis Salinas DOB: 12/11/1946   REASON FOR VISIT: follow up for stroke HISTORY FROM: patient  HISTORY OF PRESENT ILLNESS: Luis Salinas is an 68 y.o. male who was recently admitted to [facility] on 03/16/2013 with a right basal ganglia lacunar infarct. The patient initially presented with dysarthria and left-sided weakness. His symptoms improved and he was discharged on 03/17/2013. On 04/02/2013, the patient's wife went to church leaving the patient at home. She last saw him normal at 10:30 AM. She called him around 12:30 on her way home from church and noted him to be very dysarthric. She returned home and was concerned that he was having another stroke. EMS was summoned and the patient was brought to the emergency department. A stat CT scan was performed. The patient's symptoms were quickly resolving; although, it was noted that he had a new right facial droop. TPA was not a consideration in view of the patient's recent CVA and his rapid improvement. The patient had previously been discharged on Plavix 75 mg daily and he states that he had been taking it every day as instructed. The patient did admit to resuming tobacco use yesterday.  UPDATE 07/25/13 (LL): Patient comes to office for stroke follow up. States he has no weakness or residual deficits other than fatigue and sometimes loses his train of thought. He says at times his speech is slightly slurred when tired. His wife is religiously helping him take his medications. He is taking Aggrenox twice daily. Patient denies medication side effects, with no signs of bleeding or excessive bruising. He did not need PT or OT.   UPDATE 01/25/14 (LL): He comes in for stroke follow up today.   He has had no recurrent neurovascular symptoms since last visit.  He takes Crestor and Aggrenox daily without known side effects.  His recent lipid panel was very good.  His blood pressure he states is well controlled, it is 146/76 in office  today.  His wife has concerns that his memory is not as good and that he gets confused over when things happened in the past.  He is quick to correct her and is defensive about his memory loss.  He resists having his memory tested today.  REVIEW OF SYSTEMS: Full 14 system review of systems performed and notable only for:  Constitutional: chills  Ear/Nose/Throat: neck stiffness Eyes: eye itching, eye redness Respiratory: shortness of breath Musculoskeletal: joint pain  Neurological: memory loss  ALLERGIES: No Known Allergies  HOME MEDICATIONS: Outpatient Prescriptions Prior to Visit  Medication Sig Dispense Refill  . Ascorbic Acid (VITAMIN C) 1000 MG tablet Take 1,000 mg by mouth daily.      Marland Kitchen dipyridamole-aspirin (AGGRENOX) 200-25 MG per 12 hr capsule Take 1 capsule by mouth 2 (two) times daily.  180 capsule  1  . Fluticasone-Salmeterol (ADVAIR DISKUS) 250-50 MCG/DOSE AEPB Inhale 1 puff into the lungs every 12 (twelve) hours.      . nebivolol (BYSTOLIC) 5 MG tablet Take 1 tablet (5 mg total) by mouth daily.  90 tablet  1  . rosuvastatin (CRESTOR) 40 MG tablet Take 1 tablet (40 mg total) by mouth daily.  90 tablet  1  . tiotropium (SPIRIVA HANDIHALER) 18 MCG inhalation capsule Place 18 mcg into inhaler and inhale daily.       No facility-administered medications prior to visit.    PAST MEDICAL HISTORY: Past Medical History  Diagnosis Date  . Myocardial infarction     2007 [facility]  .  Concussion     hx of  . Pneumonia     exposure to caustic chemicals  . Arthritis   . COPD (chronic obstructive pulmonary disease)   . Amnesia     He was found and reuintied with his family in 68 after 27 years.  . Stroke     PAST SURGICAL HISTORY: Past Surgical History  Procedure Laterality Date  . Cardiac catheterization    . Coronary angioplasty    . Tonsillectomy    . Joint replacement      Left knee replacement  . Rotator cuff repair      left  . Colonoscopy w/ polypectomy    .  Anterior cervical decomp/discectomy fusion  05/16/2012    Procedure: ANTERIOR CERVICAL DECOMPRESSION/DISCECTOMY FUSION 2 LEVELS;  Surgeon: Barnett Abu, MD;  Location: [facility];  Service: Neurosurgery;  Laterality: N/A;  Cervical Five-Six, Cervical Six-Seven Anterior Cervical Decompression/Diskectomy Fusion    FAMILY HISTORY: Family History  Problem Relation Age of Onset  . Anesthesia problems Neg Hx   . Hypotension Neg Hx   . Malignant hyperthermia Neg Hx   . Pseudochol deficiency Neg Hx   . Heart disease Mother   . COPD Father     SOCIAL HISTORY: History   Social History  . Marital Status: Married    Spouse Name: N/A    Number of Children: 1  . Years of Education: college   Occupational History  . Not on file.   Social History Main Topics  . Smoking status: Former Smoker    Types: Cigars    Quit date: 04/03/2013  . Smokeless tobacco: Not on file     Comment: Smoked cigars for years after quitting cigarettes.    . Alcohol Use: No  . Drug Use: Yes    Special: Marijuana  . Sexual Activity: Yes   Other Topics Concern  . Not on file   Social History Narrative   Lives with his fourth wife.      PHYSICAL EXAM  Filed Vitals:   01/25/14 1333  BP: 146/76  Pulse: 57  Height: 5\' 9"  (1.753 m)  Weight: 168 lb (76.204 kg)   Body mass index is 24.8 kg/(m^2).  Generalized: In no acute distress  Neck: Supple, no carotid bruits  Cardiac: Regular rate rhythm, no murmur  Pulmonary: Clear to auscultation bilaterally  Musculoskeletal: No deformity   Neurological examination  Mentation: Alert oriented to time, place, history taking, language fluent, and casual conversation  Cranial nerve II-XII: Pupils were equal round reactive to light extraocular movements were full, visual field were full on confrontational test. facial sensation and strength were normal. hearing was intact to finger rubbing bilaterally. Uvula tongue midline. head turning and shoulder shrug and were  normal and symmetric.Tongue protrusion into cheek strength was normal.  MOTOR: normal bulk and tone, full strength in the BUE, BLE, Diminished fine finger movements on the left. no pronator drift  SENSORY: normal and symmetric to light touch COORDINATION: finger-nose-finger, heel-to-shin bilaterally, there was no truncal ataxia  REFLEXES: Brachioradialis 2/2, biceps 2/2, triceps 2/2, patellar 2/2, Achilles 2/2, plantar responses were flexor bilaterally.  GAIT/STATION: Rising up from seated position without assistance, normal stance, without trunk ataxia, moderate stride, good arm swing, smooth turning, able to perform tiptoe, and heel walking without difficulty.   DIAGNOSTIC DATA (LABS, IMAGING, TESTING) - I reviewed patient records, labs, notes, testing and imaging myself where available.  Lab Results  Component Value Date   WBC 9.6 01/08/2014  HGB 15.7 01/08/2014   HCT 47.0 01/08/2014   MCV 85.8 01/08/2014   PLT 174 05/11/2013      Component Value Date/Time   NA 142 01/08/2014 1453   NA 139 05/01/2013 1302   K 4.7 01/08/2014 1453   CL 103 01/08/2014 1453   CO2 23 01/08/2014 1453   GLUCOSE 85 01/08/2014 1453   GLUCOSE 101* 05/01/2013 1302   BUN 11 01/08/2014 1453   BUN 15 05/01/2013 1302   CREATININE 1.01 01/08/2014 1453   CREATININE 0.81 05/01/2013 1302   CALCIUM 9.0 01/08/2014 1453   PROT 6.9 01/08/2014 1453   PROT 7.0 05/01/2013 1302   ALBUMIN 4.2 05/01/2013 1302   AST 23 01/08/2014 1453   ALT 27 01/08/2014 1453   ALKPHOS 95 01/08/2014 1453   BILITOT 0.2 01/08/2014 1453   GFRNONAA 76 01/08/2014 1453   GFRAA 88 01/08/2014 1453   Lab Results  Component Value Date   CHOL 158 03/17/2013   HDL 36* 01/08/2014   LDLCALC 39 01/08/2014   TRIG 130 01/08/2014   CHOLHDL 2.8 01/08/2014   Lab Results  Component Value Date   HGBA1C 6.0* 03/17/2013   ASSESSMENT AND PLAN Mr. Maralyn SagoWilliam H Levee is a 68 y.o. Male with two right basal ganglia lacunar infarcts in April 2014, presenting with dysarthria and left  hemiparesis. Infarcts felt to be thrombotic secondary to small vessel disease. Patient with residual confusion at times. Vascular risk factors of hypertension, hyperlipidemia, former smoker.  Wife has concerns over patient's memory and confusion at times, but he was resistant to memory testing today.  Would like to readdress in the future with MOCA or MMSE.  PLAN: Continue dipyridamole SR 250 mg/aspirin 25 mg orally twice a day for secondary stroke prevention and maintain strict control of hypertension with blood pressure goal below 130/90, and lipids with LDL cholesterol goal below 100 mg/dL.  Followup in 6 months with Dr. Pearlean BrownieSethi, readdress memory loss at that time.  Ronal FearLYNN E. Kebrina Friend, MSN, NP-C 01/25/2014, 1:59 PM [facility] [address], [address] [place], [place] [address] [phone]  Note: This document was prepared with digital dictation and possible smart phrase technology. Any transcriptional errors that result from this process are unintentional.

## 2014-01-25 NOTE — Patient Instructions (Signed)
  PLAN: Continue dipyridamole SR 250 mg/aspirin 25 mg orally twice a day for secondary stroke prevention and maintain strict control of hypertension with blood pressure goal below 130/90, and lipids with LDL cholesterol goal below 100 mg/dL.  Followup in 6 months.

## 2014-06-04 ENCOUNTER — Encounter: Payer: Self-pay | Admitting: Neurology

## 2014-06-29 ENCOUNTER — Telehealth: Payer: Self-pay | Admitting: General Practice

## 2014-06-29 NOTE — Telephone Encounter (Signed)
Pt aware.

## 2014-06-29 NOTE — Telephone Encounter (Signed)
Patient is on aggrenox and when he pulled a tick off of him yesterday he bleed a lot and wants to know if this is normal. I explained that the asa in the aggrenox probably caused the free bleeding and that i would let the provider review and then we would call them back. Patient was curious to know if he should have his blood checked to make sure it is not to thin

## 2014-06-29 NOTE — Telephone Encounter (Signed)
You do not have to have  Blood checked when yu are on aggrenox- it s normal to bleed more when you are on a blood thinner.

## 2014-07-26 ENCOUNTER — Ambulatory Visit: Payer: Medicare Other | Admitting: Neurology

## 2014-09-14 ENCOUNTER — Other Ambulatory Visit: Payer: Self-pay | Admitting: Nurse Practitioner

## 2014-09-28 ENCOUNTER — Other Ambulatory Visit: Payer: Self-pay

## 2014-10-03 ENCOUNTER — Other Ambulatory Visit: Payer: Self-pay | Admitting: Family Medicine

## 2014-10-04 NOTE — Telephone Encounter (Signed)
Last seen 01/08/14 Luis Salinas   Requesting 90 day supply

## 2014-10-04 NOTE — Telephone Encounter (Signed)
This is okay x1. The patient needs to make an appointment to come in and be seen by provider when this prescription is complete

## 2014-10-30 ENCOUNTER — Ambulatory Visit: Payer: Medicare Other

## 2014-12-11 ENCOUNTER — Other Ambulatory Visit: Payer: Self-pay | Admitting: Nurse Practitioner

## 2014-12-18 ENCOUNTER — Telehealth: Payer: Self-pay | Admitting: Nurse Practitioner

## 2014-12-18 MED ORDER — ROSUVASTATIN CALCIUM 40 MG PO TABS: 40.0000 mg | ORAL_TABLET | Freq: Every day | ORAL | Status: DC

## 2014-12-18 NOTE — Telephone Encounter (Signed)
Patient aware rx was refilled for only a 30day supply.

## 2014-12-27 ENCOUNTER — Encounter: Payer: Self-pay | Admitting: Nurse Practitioner

## 2014-12-27 ENCOUNTER — Ambulatory Visit (INDEPENDENT_AMBULATORY_CARE_PROVIDER_SITE_OTHER): Payer: Medicare Other | Admitting: Nurse Practitioner

## 2014-12-27 VITALS — BP 131/74 | HR 64 | Temp 96.8°F | Ht 69.0 in | Wt 161.0 lb

## 2014-12-27 DIAGNOSIS — G459 Transient cerebral ischemic attack, unspecified: Secondary | ICD-10-CM

## 2014-12-27 DIAGNOSIS — I10 Essential (primary) hypertension: Secondary | ICD-10-CM | POA: Diagnosis not present

## 2014-12-27 DIAGNOSIS — E785 Hyperlipidemia, unspecified: Secondary | ICD-10-CM

## 2014-12-27 DIAGNOSIS — J411 Mucopurulent chronic bronchitis: Secondary | ICD-10-CM | POA: Diagnosis not present

## 2014-12-27 DIAGNOSIS — I2510 Atherosclerotic heart disease of native coronary artery without angina pectoris: Secondary | ICD-10-CM | POA: Diagnosis not present

## 2014-12-27 DIAGNOSIS — Z720 Tobacco use: Secondary | ICD-10-CM

## 2014-12-27 DIAGNOSIS — Z7902 Long term (current) use of antithrombotics/antiplatelets: Secondary | ICD-10-CM

## 2014-12-27 DIAGNOSIS — Z125 Encounter for screening for malignant neoplasm of prostate: Secondary | ICD-10-CM | POA: Diagnosis not present

## 2014-12-27 MED ORDER — ASPIRIN-DIPYRIDAMOLE ER 25-200 MG PO CP12: 1.0000 | ORAL_CAPSULE | Freq: Two times a day (BID) | ORAL | Status: DC

## 2014-12-27 MED ORDER — TIOTROPIUM BROMIDE MONOHYDRATE 18 MCG IN CAPS: 18.0000 ug | ORAL_CAPSULE | Freq: Every day | RESPIRATORY_TRACT | Status: DC

## 2014-12-27 MED ORDER — FLUTICASONE-SALMETEROL 250-50 MCG/DOSE IN AEPB: 1.0000 | INHALATION_SPRAY | Freq: Two times a day (BID) | RESPIRATORY_TRACT | Status: DC

## 2014-12-27 MED ORDER — ROSUVASTATIN CALCIUM 40 MG PO TABS: 40.0000 mg | ORAL_TABLET | Freq: Every day | ORAL | Status: DC

## 2014-12-27 MED ORDER — NEBIVOLOL HCL 5 MG PO TABS: 5.0000 mg | ORAL_TABLET | Freq: Every day | ORAL | Status: DC

## 2014-12-27 NOTE — Patient Instructions (Signed)

## 2014-12-27 NOTE — Progress Notes (Signed)
Subjective:    [name], male    DOB: 07/25/1946, 69 y.o.   MRN: [id number]   Patient here today for follow up of chronic medical problems.   Hyperlipidemia This is a chronic problem. The current episode started more than 1 year ago. The problem is controlled. Recent lipid tests were reviewed and are normal. Current antihyperlipidemic treatment includes statins. The current treatment provides moderate improvement of lipids. Compliance problems include adherence to diet and adherence to exercise.  Risk factors for coronary artery disease include dyslipidemia, hypertension and male sex.  Hypertension This is a chronic problem. The current episode started more than 1 year ago. The problem is unchanged. The problem is controlled. Past treatments include beta blockers. The current treatment provides moderate improvement. Compliance problems include diet and exercise.   COPD On advair and spirivia daily- no recent flare ups CVA Hx X2 No residual effects- doing quite well- takes aggrenox daily- no bleeding noted      Review of Systems  Constitutional: Positive for appetite change.  HENT: Negative.   Respiratory: Negative.   Cardiovascular: Negative.   Gastrointestinal: Negative.   Genitourinary: Negative.   Neurological: Negative.   Psychiatric/Behavioral: Negative.   All other systems reviewed and are negative.      Objective:   Physical Exam  Constitutional: He is oriented to person, place, and time. He appears well-developed and well-nourished.  HENT:  Head: Normocephalic.  Right Ear: External ear normal.  Left Ear: External ear normal.  Nose: Nose normal.  Mouth/Throat: Oropharynx is clear and moist.  Eyes: EOM are normal. Pupils are equal, round, and reactive to light.  Neck: Normal range of motion. Neck supple. No JVD present. No thyromegaly present.  Cardiovascular: Normal rate, regular rhythm, normal heart sounds and intact distal pulses.  Exam  reveals no gallop and no friction rub.   No murmur heard. Pulmonary/Chest: Effort normal and breath sounds normal. No respiratory distress. He has no wheezes. He has no rales. He exhibits no tenderness.  Abdominal: Soft. Bowel sounds are normal. He exhibits no mass. There is no tenderness.  Musculoskeletal: Normal range of motion. He exhibits no edema.  Lymphadenopathy:    He has no cervical adenopathy.  Neurological: He is alert and oriented to person, place, and time. No cranial nerve deficit.  Skin: Skin is warm and dry.  Psychiatric: He has a normal mood and affect. His behavior is normal. Judgment and thought content normal.   BP 131/74 mmHg  Pulse 64  Temp(Src) 96.8 F (36 C) (Oral)  Ht _0  (1.753 m)  Wt 161 lb (73.029 kg)  BMI 23.76 kg/m2        Assessment & Plan:  1. Transient cerebral ischemia, unspecified transient cerebral ischemia type - dipyridamole-aspirin (AGGRENOX) 200-25 MG per 12 hr capsule; Take 1 capsule by mouth 2 (two) times daily.  Dispense: 180 capsule; Refill: 1  2. CAD in native artery  3. Essential hypertension Do not add salt to diet - CMP14+EGFR - nebivolol (BYSTOLIC) 5 MG tablet; Take 1 tablet (5 mg total) by mouth daily.  Dispense: 90 tablet; Refill: 1  4. Mucopurulent chronic bronchitis - Fluticasone-Salmeterol (ADVAIR DISKUS) 250-50 MCG/DOSE AEPB; Inhale 1 puff into the lungs every 12 (twelve) hours.  Dispense: 180 each; Refill: 1 - tiotropium (SPIRIVA HANDIHALER) 18 MCG inhalation capsule; Place 1 capsule (18 mcg total) into inhaler and inhale daily.  Dispense: 90 capsule; Refill: 1  5. Hyperlipidemia Low fta diet - NMR, lipoprofile -  rosuvastatin (CRESTOR) 40 MG tablet; Take 1 tablet (40 mg total) by mouth daily.  Dispense: 90 tablet; Refill: 1  6. Tobacco abuse Smoking cessation  7. Antiplatelet or antithrombotic long-term use - nebivolol (BYSTOLIC) 5 MG tablet; Take 1 tablet (5 mg total) by mouth daily.  Dispense: 90 tablet;  Refill: 1  8. Prostate cancer screening - PSA, total and free    Labs pending Health maintenance reviewed Diet and exercise encouraged Continue all meds Follow up  In 3 month   [place], FNP

## 2014-12-28 LAB — CMP14+EGFR
ALK PHOS: 93 IU/L (ref 39–117)
ALT: 15 IU/L (ref 0–44)
AST: 13 IU/L (ref 0–40)
Albumin/Globulin Ratio: 1.7 (ref 1.1–2.5)
Albumin: 4 g/dL (ref 3.6–4.8)
BUN/Creatinine Ratio: 10 (ref 10–22)
BUN: 9 mg/dL (ref 8–27)
CALCIUM: 8.8 mg/dL (ref 8.6–10.2)
CHLORIDE: 102 mmol/L (ref 97–108)
CO2: 26 mmol/L (ref 18–29)
Creatinine, Ser: 0.92 mg/dL (ref 0.76–1.27)
GFR, EST AFRICAN AMERICAN: 98 mL/min/{1.73_m2} (ref >59)
GFR, EST NON AFRICAN AMERICAN: 85 mL/min/{1.73_m2} (ref >59)
GLOBULIN, TOTAL: 2.4 g/dL (ref 1.5–4.5)
GLUCOSE: 156 mg/dL — AB (ref 65–99)
POTASSIUM: 4.1 mmol/L (ref 3.5–5.2)
Sodium: 140 mmol/L (ref 134–144)
Total Bilirubin: 0.3 mg/dL (ref 0.0–1.2)
Total Protein: 6.4 g/dL (ref 6.0–8.5)

## 2014-12-28 LAB — PSA, TOTAL AND FREE
PSA FREE PCT: 34.3 %
PSA, Free: 0.24 ng/mL
PSA: 0.7 ng/mL (ref 0.0–4.0)

## 2014-12-28 LAB — NMR, LIPOPROFILE
Cholesterol: 97 mg/dL — ABNORMAL LOW (ref 100–199)
HDL CHOLESTEROL BY NMR: 34 mg/dL — AB (ref >39)
HDL Particle Number: 27.1 umol/L — ABNORMAL LOW (ref >=30.5)
LDL PARTICLE NUMBER: 561 nmol/L (ref <1000)
LDL SIZE: 21 nm (ref >20.5)
LDL-C: 39 mg/dL (ref 0–99)
SMALL LDL PARTICLE NUMBER: 289 nmol/L (ref <=527)
TRIGLYCERIDES BY NMR: 118 mg/dL (ref 0–149)

## 2015-01-01 ENCOUNTER — Other Ambulatory Visit: Payer: Self-pay | Admitting: Family Medicine

## 2015-01-15 ENCOUNTER — Encounter: Payer: Self-pay | Admitting: Neurology

## 2015-01-15 ENCOUNTER — Ambulatory Visit (INDEPENDENT_AMBULATORY_CARE_PROVIDER_SITE_OTHER): Payer: Medicare Other | Admitting: Neurology

## 2015-01-15 VITALS — BP 114/65 | HR 59 | Ht 69.0 in | Wt 163.4 lb

## 2015-01-15 DIAGNOSIS — R413 Other amnesia: Secondary | ICD-10-CM | POA: Diagnosis not present

## 2015-01-15 NOTE — Progress Notes (Signed)
PATIENT: Luis Salinas DOB: [birth year]   REASON FOR VISIT: follow up for stroke HISTORY FROM: patient  HISTORY OF PRESENT ILLNESS: Luis Salinas is an 69 y.o. male who was recently admitted to [facility] on 03/16/2013 with a right basal ganglia lacunar infarct. The patient initially presented with dysarthria and left-sided weakness. His symptoms improved and he was discharged on 03/17/2013. On 04/02/2013, the patient's wife went to church leaving the patient at home. She last saw him normal at 10:30 AM. She called him around 12:30 on her way home from church and noted him to be very dysarthric. She returned home and was concerned that he was having another stroke. EMS was summoned and the patient was brought to the emergency department. A stat CT scan was performed. The patient's symptoms were quickly resolving; although, it was noted that he had a new right facial droop. TPA was not a consideration in view of the patient's recent CVA and his rapid improvement. The patient had previously been discharged on Plavix 75 mg daily and he states that he had been taking it every day as instructed. The patient did admit to resuming tobacco use yesterday.  UPDATE 07/25/13 (LL): Patient comes to office for stroke follow up. States he has no weakness or residual deficits other than fatigue and sometimes loses his train of thought. He says at times his speech is slightly slurred when tired. His wife is religiously helping him take his medications. He is taking Aggrenox twice daily. Patient denies medication side effects, with no signs of bleeding or excessive bruising. He did not need PT or OT.   UPDATE 01/25/14 (LL): He comes in for stroke follow up today.   He has had no recurrent neurovascular symptoms since last visit.  He takes Crestor and Aggrenox daily without known side effects.  His recent lipid panel was very good.  His blood pressure he states is well controlled, it is 146/76 in office  today.  His wife has concerns that his memory is not as good and that he gets confused over when things happened in the past.  He is quick to correct her and is defensive about his memory loss.  He resists having his memory tested today. Update 01/15/2015 : He returns for follow-up after last visit 1 year ago. He continues to have occasional word finding difficulties and mild short-term memory problems since his stroke in 2014. He has trouble tolerating and paying attention during multiple conversations simultaneously going on. He sometimes loses track of time. He remains on Aggrenox which is tolerating well without side effects. He continues to smoke 2-3 cigarettes a day though he has successfully quit in the past for rash short time. He had lipid profile checked last week by his primary physician and it was apparently fine though I do not have the results. States his blood pressure is doing quite good. He does complain of some cramps in his legs which may be related to Crestor. He is not had any recurrent stroke or TIA symptoms. REVIEW OF SYSTEMS: Full 14 system review of systems performed and notable only for: chills, fatigue, eye itching and redness, shortness of breath, easy bleeding, memory loss, joint pain, muscle cramps, neck pain, insomnia, frequent walking and all other systems negative   ALLERGIES: No Known Allergies  HOME MEDICATIONS: Outpatient Prescriptions Prior to Visit  Medication Sig Dispense Refill  . dipyridamole-aspirin (AGGRENOX) 200-25 MG per 12 hr capsule Take 1 capsule by mouth 2 (two)  times daily. 180 capsule 1  . Fluticasone-Salmeterol (ADVAIR DISKUS) 250-50 MCG/DOSE AEPB Inhale 1 puff into the lungs every 12 (twelve) hours. 180 each 1  . nebivolol (BYSTOLIC) 5 MG tablet Take 1 tablet (5 mg total) by mouth daily. 90 tablet 1  . rosuvastatin (CRESTOR) 40 MG tablet Take 1 tablet (40 mg total) by mouth daily. 90 tablet 1  . tiotropium (SPIRIVA HANDIHALER) 18 MCG inhalation capsule  Place 1 capsule (18 mcg total) into inhaler and inhale daily. 90 capsule 1  . Ascorbic Acid (VITAMIN C) 1000 MG tablet Take 1,000 mg by mouth daily.     No facility-administered medications prior to visit.    PAST MEDICAL HISTORY: Past Medical History  Diagnosis Date  . Myocardial infarction     2007 [facility]  . Concussion     hx of  . Pneumonia     exposure to caustic chemicals  . Arthritis   . COPD (chronic obstructive pulmonary disease)   . Amnesia     He was found and reuintied with his family in 15 after 27 years.  . Stroke     PAST SURGICAL HISTORY: Past Surgical History  Procedure Laterality Date  . Cardiac catheterization    . Coronary angioplasty    . Tonsillectomy    . Joint replacement      Left knee replacement  . Rotator cuff repair      left  . Colonoscopy w/ polypectomy    . Anterior cervical decomp/discectomy fusion  05/16/2012    Procedure: ANTERIOR CERVICAL DECOMPRESSION/DISCECTOMY FUSION 2 LEVELS;  Surgeon: Barnett Abu, MD;  Location: [facility];  Service: Neurosurgery;  Laterality: N/A;  Cervical Five-Six, Cervical Six-Seven Anterior Cervical Decompression/Diskectomy Fusion    FAMILY HISTORY: Family History  Problem Relation Age of Onset  . Anesthesia problems Neg Hx   . Hypotension Neg Hx   . Malignant hyperthermia Neg Hx   . Pseudochol deficiency Neg Hx   . Heart disease Mother   . COPD Father     SOCIAL HISTORY: History   Social History  . Marital Status: Married    Spouse Name: N/A    Number of Children: 1  . Years of Education: college   Occupational History  . Not on file.   Social History Main Topics  . Smoking status: Former Smoker    Types: Cigars    Quit date: 04/03/2013  . Smokeless tobacco: Not on file     Comment: Smoked cigars for years after quitting cigarettes.    . Alcohol Use: No  . Drug Use: No  . Sexual Activity: Yes   Other Topics Concern  . Not on file   Social History Narrative   Lives with his fourth  wife.      PHYSICAL EXAM  Filed Vitals:   01/15/15 1122  BP: 114/65  Pulse: 59  Height: 5\' 9"  (1.753 m)  Weight: 163 lb 6.4 oz (74.118 kg)   Body mass index is 24.12 kg/(m^2).  Generalized: In no acute distress  Neck: Supple, no carotid bruits  Cardiac: Regular rate rhythm, no murmur  Pulmonary: Clear to auscultation bilaterally  Musculoskeletal: No deformity   Neurological examination  Mentation: Alert oriented to time, place, history taking, language fluent, and casual conversation Mini-Mental status exam scored 27/30. Animal fluency test 11. Clock drawing 4/4. Depression scale for only not depressed Cranial nerve II-XII: Pupils were equal round reactive to light extraocular movements were full, visual field were full on confrontational test. facial sensation  and strength were normal. hearing was intact to finger rubbing bilaterally. Uvula tongue midline. head turning and shoulder shrug and were normal and symmetric.Tongue protrusion into cheek strength was normal.  MOTOR: normal bulk and tone, full strength in the BUE, BLE, Diminished fine finger movements on the left. no pronator drift  SENSORY: normal and symmetric to light touch COORDINATION: finger-nose-finger, heel-to-shin bilaterally, there was no truncal ataxia  REFLEXES: Brachioradialis 2/2, biceps 2/2, triceps 2/2, patellar 2/2, Achilles 2/2, plantar responses were flexor bilaterally.  GAIT/STATION: Rising up from seated position without assistance, normal stance, gait slightly antalgic due to left knee pain.Unable to perform tiptoe, and heel walking without difficulty.   DIAGNOSTIC DATA (LABS, IMAGING, TESTING) - I reviewed patient records, labs, notes, testing and imaging myself where available.  Lab Results  Component Value Date   WBC 9.6 01/08/2014   HGB 15.7 01/08/2014   HCT 47.0 01/08/2014   MCV 85.8 01/08/2014   PLT 174 05/11/2013      Component Value Date/Time   NA 140 12/27/2014 1537   NA 139  05/01/2013 1302   K 4.1 12/27/2014 1537   CL 102 12/27/2014 1537   CO2 26 12/27/2014 1537   GLUCOSE 156* 12/27/2014 1537   GLUCOSE 101* 05/01/2013 1302   BUN 9 12/27/2014 1537   BUN 15 05/01/2013 1302   CREATININE 0.92 12/27/2014 1537   CREATININE 0.81 05/01/2013 1302   CALCIUM 8.8 12/27/2014 1537   PROT 6.4 12/27/2014 1537   PROT 7.0 05/01/2013 1302   ALBUMIN 4.2 05/01/2013 1302   AST 13 12/27/2014 1537   ALT 15 12/27/2014 1537   ALKPHOS 93 12/27/2014 1537   BILITOT 0.3 12/27/2014 1537   GFRNONAA 85 12/27/2014 1537   GFRNONAA >89 05/01/2013 1302   GFRAA 98 12/27/2014 1537   GFRAA >89 05/01/2013 1302   Lab Results  Component Value Date   CHOL 97* 12/27/2014   HDL 34* 12/27/2014   LDLCALC 39 01/08/2014   TRIG 118 12/27/2014   CHOLHDL 2.8 01/08/2014   Lab Results  Component Value Date   HGBA1C 6.0* 03/17/2013   ASSESSMENT AND PLAN Mr. Luis Salinas is a 69 y.o. Male with two right basal ganglia lacunar infarcts in April 2014, presenting with dysarthria and left hemiparesis. Infarcts felt to be thrombotic secondary to small vessel disease. Patient with residual confusion at times. Vascular risk factors of hypertension, hyperlipidemia, former smoker.  Wife has concerns over patient's memory and confusion at times, but he was resistant to memory testing today.  Would like to readdress in the future with MOCA or MMSE.  PLAN: I had a long d/w patient and his wife about his remote stroke, risk for recurrent stroke/TIAs, personally independently reviewed imaging studies and stroke evaluation results and answered questions.Continue Aggrenox orally twice a day  for secondary stroke prevention and maintain strict control of hypertension with blood pressure goal below 130/90,  and lipids with LDL cholesterol goal below 100 mg/dL. I also advised the patient to  Quit smoking completely ,eat a healthy diet with plenty of whole grains, cereals, fruits and vegetables, exercise regularly and  maintain ideal body weight Followup in the future with me in  6 months   Topher Buenaventura, MD  01/15/2015, 6:14 PM [facility] [address], [address] [place], [place] [address] [phone]  Note: This document was prepared with digital dictation and possible smart phrase technology. Any transcriptional errors that result from this process are unintentional.

## 2015-01-15 NOTE — Patient Instructions (Addendum)
I had a long d/w patient and his wife about his remote stroke, risk for recurrent stroke/TIAs, personally independently reviewed imaging studies and stroke evaluation results and answered questions.Continue Aggrenox orally twice a day  for secondary stroke prevention and maintain strict control of hypertension with blood pressure goal below 130/90,  and lipids with LDL cholesterol goal below 100 mg/dL. I also advised the patient to  Quit smoking completely ,eat a healthy diet with plenty of whole grains, cereals, fruits and vegetables, exercise regularly and maintain ideal body weight Followup in the future with me in  6 months

## 2015-03-29 ENCOUNTER — Encounter: Payer: Self-pay | Admitting: Nurse Practitioner

## 2015-03-29 ENCOUNTER — Ambulatory Visit (INDEPENDENT_AMBULATORY_CARE_PROVIDER_SITE_OTHER): Payer: Medicare Other | Admitting: Nurse Practitioner

## 2015-03-29 VITALS — BP 133/74 | HR 62 | Temp 97.0°F | Ht 69.0 in | Wt 159.0 lb

## 2015-03-29 DIAGNOSIS — Z720 Tobacco use: Secondary | ICD-10-CM

## 2015-03-29 DIAGNOSIS — G459 Transient cerebral ischemic attack, unspecified: Secondary | ICD-10-CM

## 2015-03-29 DIAGNOSIS — I2510 Atherosclerotic heart disease of native coronary artery without angina pectoris: Secondary | ICD-10-CM

## 2015-03-29 DIAGNOSIS — I10 Essential (primary) hypertension: Secondary | ICD-10-CM

## 2015-03-29 DIAGNOSIS — Z23 Encounter for immunization: Secondary | ICD-10-CM | POA: Diagnosis not present

## 2015-03-29 DIAGNOSIS — E785 Hyperlipidemia, unspecified: Secondary | ICD-10-CM | POA: Diagnosis not present

## 2015-03-29 DIAGNOSIS — J411 Mucopurulent chronic bronchitis: Secondary | ICD-10-CM | POA: Diagnosis not present

## 2015-03-29 NOTE — Addendum Note (Signed)
Addended by: Cleda Daub on: 03/29/2015 03:20 PM   Modules accepted: Orders

## 2015-03-29 NOTE — Patient Instructions (Signed)
Exercise to Stay Healthy Exercise helps you become and stay healthy. EXERCISE IDEAS AND TIPS Choose exercises that:  You enjoy.  Fit into your day. You do not need to exercise really hard to be healthy. You can do exercises at a slow or medium level and stay healthy. You can:  Stretch before and after working out.  Try yoga, Pilates, or tai chi.  Lift weights.  Walk fast, swim, jog, run, climb stairs, bicycle, dance, or rollerskate.  Take aerobic classes. Exercises that burn about 150 calories:  Running 1  miles in 15 minutes.  Playing volleyball for 45 to 60 minutes.  Washing and waxing a car for 45 to 60 minutes.  Playing touch football for 45 minutes.  Walking 1  miles in 35 minutes.  Pushing a stroller 1  miles in 30 minutes.  Playing basketball for 30 minutes.  Raking leaves for 30 minutes.  Bicycling 5 miles in 30 minutes.  Walking 2 miles in 30 minutes.  Dancing for 30 minutes.  Shoveling snow for 15 minutes.  Swimming laps for 20 minutes.  Walking up stairs for 15 minutes.  Bicycling 4 miles in 15 minutes.  Gardening for 30 to 45 minutes.  Jumping rope for 15 minutes.  Washing windows or floors for 45 to 60 minutes. Document Released: 01/02/2011 Document Revised: 02/22/2012 Document Reviewed: 01/02/2011 Exit[employer] Patient Information 2015 [employer]. This information is not intended to replace advice given to you by your health care provider. Make sure you discuss any questions you have with your health care provider.  

## 2015-03-29 NOTE — Progress Notes (Signed)
   Subjective:    [name], male    DOB: Feb 14, 1946, 69 y.o.   MRN: [id number]   Patient here today for follow up of chronic medical problems.   Hyperlipidemia This is a chronic problem. The current episode started more than 1 year ago. The problem is controlled. Recent lipid tests were reviewed and are normal. Current antihyperlipidemic treatment includes statins. The current treatment provides moderate improvement of lipids. Compliance problems include adherence to diet and adherence to exercise.  Risk factors for coronary artery disease include dyslipidemia, hypertension and male sex.  Hypertension This is a chronic problem. The current episode started more than 1 year ago. The problem is unchanged. The problem is controlled. Past treatments include beta blockers. The current treatment provides moderate improvement. Compliance problems include diet and exercise.   COPD On advair and spirivia daily- no recent flare ups CVA Hx X2 No residual effects- doing quite well- takes aggrenox daily- no bleeding noted      Review of Systems  Constitutional: Positive for appetite change.  HENT: Negative.   Respiratory: Negative.   Cardiovascular: Negative.   Gastrointestinal: Negative.   Genitourinary: Negative.  Negative for enuresis.  Neurological: Negative.   Psychiatric/Behavioral: Negative.   All other systems reviewed and are negative.      Objective:   Physical Exam  Constitutional: He is oriented to person, place, and time. He appears well-developed and well-nourished.  HENT:  Head: Normocephalic.  Right Ear: External ear normal.  Left Ear: External ear normal.  Nose: Nose normal.  Mouth/Throat: Oropharynx is clear and moist.  Eyes: EOM are normal. Pupils are equal, round, and reactive to light.  Neck: Normal range of motion. Neck supple. No JVD present. No thyromegaly present.  Cardiovascular: Normal rate, regular rhythm, normal heart sounds and intact  distal pulses.  Exam reveals no gallop and no friction rub.   No murmur heard. Pulmonary/Chest: Effort normal and breath sounds normal. No respiratory distress. He has no wheezes. He has no rales. He exhibits no tenderness.  Abdominal: Soft. Bowel sounds are normal. He exhibits no mass. There is no tenderness.  Musculoskeletal: Normal range of motion. He exhibits no edema.  Lymphadenopathy:    He has no cervical adenopathy.  Neurological: He is alert and oriented to person, place, and time. No cranial nerve deficit.  Skin: Skin is warm and dry.  Psychiatric: He has a normal mood and affect. His behavior is normal. Judgment and thought content normal.   BP 133/74 mmHg  Pulse 62  Temp(Src) 97 F (36.1 C) (Oral)  Ht _0  (1.753 m)  Wt 159 lb (72.122 kg)  BMI 23.47 kg/m2        Assessment & Plan:  1. Essential hypertension Do not add slat to diet - CMP14+EGFR  2. CAD in native artery Keep follow up apointments with cardiologist  3. Mucopurulent chronic bronchitis  4. Tobacco abuse Smoking cessation encouraged  5. Hyperlipidemia Low fat diet - NMR, lipoprofile  6. Transient cerebral ischemia, unspecified transient cerebral ischemia type Continue aggrenox   Labs pending Health maintenance reviewed Diet and exercise encouraged Continue all meds Follow up  In 3 month   [place], FNP

## 2015-03-30 LAB — CMP14+EGFR
A/G RATIO: 1.6 (ref 1.1–2.5)
ALBUMIN: 4.2 g/dL (ref 3.6–4.8)
ALT: 17 IU/L (ref 0–44)
AST: 15 IU/L (ref 0–40)
Alkaline Phosphatase: 99 IU/L (ref 39–117)
BUN / CREAT RATIO: 10 (ref 10–22)
BUN: 10 mg/dL (ref 8–27)
Bilirubin Total: 0.4 mg/dL (ref 0.0–1.2)
CO2: 24 mmol/L (ref 18–29)
CREATININE: 0.96 mg/dL (ref 0.76–1.27)
Calcium: 9 mg/dL (ref 8.6–10.2)
Chloride: 102 mmol/L (ref 97–108)
GFR, EST AFRICAN AMERICAN: 93 mL/min/{1.73_m2} (ref >59)
GFR, EST NON AFRICAN AMERICAN: 80 mL/min/{1.73_m2} (ref >59)
GLOBULIN, TOTAL: 2.6 g/dL (ref 1.5–4.5)
Glucose: 89 mg/dL (ref 65–99)
Potassium: 4.2 mmol/L (ref 3.5–5.2)
Sodium: 141 mmol/L (ref 134–144)
Total Protein: 6.8 g/dL (ref 6.0–8.5)

## 2015-03-30 LAB — NMR, LIPOPROFILE
Cholesterol: 97 mg/dL — ABNORMAL LOW (ref 100–199)
HDL CHOLESTEROL BY NMR: 38 mg/dL — AB (ref >39)
HDL Particle Number: 29.2 umol/L — ABNORMAL LOW (ref >=30.5)
LDL Particle Number: 550 nmol/L (ref <1000)
LDL SIZE: 20.5 nm (ref >20.5)
LDL-C: 41 mg/dL (ref 0–99)
LP-IR Score: 36 (ref <=45)
Small LDL Particle Number: 303 nmol/L (ref <=527)
TRIGLYCERIDES BY NMR: 88 mg/dL (ref 0–149)

## 2015-05-17 ENCOUNTER — Ambulatory Visit (INDEPENDENT_AMBULATORY_CARE_PROVIDER_SITE_OTHER): Payer: Medicare Other | Admitting: *Deleted

## 2015-05-17 ENCOUNTER — Encounter: Payer: Self-pay | Admitting: *Deleted

## 2015-05-17 VITALS — BP 133/72 | HR 66 | Ht 69.0 in | Wt 159.0 lb

## 2015-05-17 DIAGNOSIS — Z0000 Encounter for general adult medical examination without abnormal findings: Secondary | ICD-10-CM | POA: Diagnosis not present

## 2015-05-17 NOTE — Progress Notes (Signed)
Subjective:   Luis Salinas is a 69 y.o. male who presents for an Initial Medicare Annual Wellness Visit. Luis Salinas lives at home with his 4th wife. He has no biological children but has raised 7. He has twin granddaughters that he is very close to. He is a retired Armed forces training and education officer. Luis Salinas has amnesia prior to 88 after being in an accident.   Review of Systems   Cardiac Risk Factors include: advanced age (>23men, >29 women);hypertension;male gender;dyslipidemia;smoking/ tobacco exposure;Other (see comment), Risk factor comments: Personal history of heart attack and stroke  Musculoskeletal: Multiple joint pains, mainly knees. He manages the pain without medication. Has been on oxycodone in the past but prefers not to take it.  Other systems negative   Objective:    Today's Vitals   05/17/15 1430  BP: 133/72  Pulse: 66  Height:  (1.753 m)  Weight: 159 lb (72.122 kg)    Current Medications (verified) Outpatient Encounter Prescriptions as of 05/17/2015  Medication Sig  . dipyridamole-aspirin (AGGRENOX) 200-25 MG per 12 hr capsule Take 1 capsule by mouth 2 (two) times daily.  . Fluticasone-Salmeterol (ADVAIR DISKUS) 250-50 MCG/DOSE AEPB Inhale 1 puff into the lungs every 12 (twelve) hours.  . nebivolol (BYSTOLIC) 5 MG tablet Take 1 tablet (5 mg total) by mouth daily.  . rosuvastatin (CRESTOR) 40 MG tablet Take 1 tablet (40 mg total) by mouth daily.  Marland Kitchen tiotropium (SPIRIVA HANDIHALER) 18 MCG inhalation capsule Place 1 capsule (18 mcg total) into inhaler and inhale daily.   No facility-administered encounter medications on file as of 05/17/2015.    Allergies (verified) Review of patient's allergies indicates no known allergies.   History: Past Medical History  Diagnosis Date  . Myocardial infarction     2007 [facility]  . Concussion     hx of  . Pneumonia     exposure to caustic chemicals  . Arthritis   . COPD (chronic obstructive pulmonary disease)   . Amnesia    He was found and reuintied with his family in 72 after 26 years.  . Stroke    Past Surgical History  Procedure Laterality Date  . Cardiac catheterization    . Coronary angioplasty    . Tonsillectomy    . Rotator cuff repair      left  . Colonoscopy w/ polypectomy    . Anterior cervical decomp/discectomy fusion  05/16/2012    Procedure: ANTERIOR CERVICAL DECOMPRESSION/DISCECTOMY FUSION 2 LEVELS;  Surgeon: Barnett Abu, MD;  Location: [facility];  Service: Neurosurgery;  Laterality: N/A;  Cervical Five-Six, Cervical Six-Seven Anterior Cervical Decompression/Diskectomy Fusion  . Joint replacement      Left knee replacement  . Joint replacement      left elbow at age 19   Family History  Problem Relation Age of Onset  . Anesthesia problems Neg Hx   . Hypotension Neg Hx   . Malignant hyperthermia Neg Hx   . Pseudochol deficiency Neg Hx   . Heart disease Mother   . COPD Father   . Aneurysm Father    Social History   Occupational History  . Retired    Social History Main Topics  . Smoking status: Former Smoker    Types: Cigars    Quit date: 04/03/2013  . Smokeless tobacco: Never Used     Comment: Smoked cigars for years after quitting cigarettes.    . Alcohol Use: No  . Drug Use: No  . Sexual Activity: Yes  Activities of Daily Living In your present state of health, do you have any difficulty performing the following activities: 05/17/2015 12/27/2014  Hearing? N N  Vision? N N  Difficulty concentrating or making decisions? Y N  Walking or climbing stairs? Y N  Dressing or bathing? N N  Doing errands, shopping? N N  Preparing Food and eating ? N -  Using the Toilet? N -  In the past six months, have you accidently leaked urine? N -  Do you have problems with loss of bowel control? N -  Managing your Medications? N -  Managing your Finances? N -  Housekeeping or managing your Housekeeping? N -   Reports some difficulty with his memory. This is followed by his  neurologist.   Some difficulty with climbing stairs due to knee pain.   Immunizations and Health Maintenance Immunization History  Administered Date(s) Administered  . Pneumococcal Conjugate-13 03/29/2015  . Pneumococcal Polysaccharide-23 10/14/2002, 12/27/2013  . Td 12/14/2008   There are no preventive care reminders to display for this patient.  Patient Care Team: Bennie Pierini, FNP as PCP - General (Nurse Practitioner) Micki Riley, MD as Consulting Physician (Neurology)   He also has a GI in [place] and an eye doctor in [place] but is unsure of their name. After some research I was also unable to ascertain their names.       Assessment:   This is a routine wellness examination for Luis Salinas.   Hearing/Vision screen No hearing or vision deficit noted during exam  Dietary issues and exercise activities discussed: Current Exercise Habits:: Home exercise routine (Patient walks and stays active as much as he can), Type of exercise: walking, Intensity: Mild  Goals    . Exercise 3x per week (30 min per time)     Walk for 30 minutes 3 times a week at a reasonable pace      Depression Screen PHQ 2/9 Scores 05/17/2015 03/29/2015 12/27/2014  PHQ - 2 Score 0 0 0    Fall Risk Fall Risk  05/17/2015 03/29/2015 12/27/2014  Falls in the past year? No No No    Cognitive Function: MMSE - Mini Mental State Exam 05/17/2015  Orientation to time 5  Orientation to Place 5  Registration 3  Attention/ Calculation 5  Recall 3  Language- name 2 objects 2  Language- repeat 1  Language- follow 3 step command 3  Language- read & follow direction 1  Write a sentence 1  Copy design 1  Total score 30   No deficit noted  Screening Tests Health Maintenance  Topic Date Due  . ZOSTAVAX  06/27/2015 (Originally 11/12/2005)  . INFLUENZA VACCINE  07/15/2015  . PNA vac Low Risk Adult (2 of 2 - PPSV23) 03/28/2016  . COLONOSCOPY  12/28/2019  . TETANUS/TDAP  12/28/2023        Plan:  Eat  a balanced diet with lean proteins, fruits, and vegetables.  Eat less fried foods. Walk for 30 minutes 3 times a week as tolerated.  Keep regularly scheduled follow ups. Bennie Pierini, FNP on 07/04/15. Move carefully to avoid falls.  During the course of the visit Luis Salinas was educated and counseled about the following appropriate screening and preventive services:   Vaccines to include Pneumoccal-up to date, Influenza-suggested annually, Td-up to date, Zostavax-declined  Electrocardiogram-last done 04/04/14  Colorectal cancer screening-colonoscopy 03/29/15  Diabetes screening-Glucose monitored at routine office visits  Glaucoma screening-Eye exam about 3 months ago  Nutrition counseling-Encouraged balanced diet with lean  proteins, fruits, vegetables. Avoid fried foods.   Prostate cancer screening-PSA 12/27/14  Patient Instructions (the written plan) were given to the patient.   Demetrios Loll, RN  05/17/2015       I have reviewed and agree with the above AWV documentation.  Mechele Claude, M.D.

## 2015-05-17 NOTE — Patient Instructions (Signed)
Eat a balanced diet with lean proteins, fruits, and vegetables.  Eat less fried foods. Walk for 30 minutes 3 times a week as tolerated.  Keep regularly scheduled follow ups. Luis Pierini, FNP on 07/04/15. Move carefully to avoid falls.  Health Maintenance A healthy lifestyle and preventative care can promote health and wellness.  Maintain regular health, dental, and eye exams.  Eat a healthy diet. Foods like vegetables, fruits, whole grains, low-fat dairy products, and lean protein foods contain the nutrients you need and are low in calories. Decrease your intake of foods high in solid fats, added sugars, and salt. Get information about a proper diet from your health care provider, if necessary.  Regular physical exercise is one of the most important things you can do for your health. Most adults should get at least 150 minutes of moderate-intensity exercise (any activity that increases your heart rate and causes you to sweat) each week. In addition, most adults need muscle-strengthening exercises on 2 or more days a week.   Maintain a healthy weight. The body mass index (BMI) is a screening tool to identify possible weight problems. It provides an estimate of body fat based on height and weight. Your health care provider can find your BMI and can help you achieve or maintain a healthy weight. For males 20 years and older:  A BMI below 18.5 is considered underweight.  A BMI of 18.5 to 24.9 is normal.  A BMI of 25 to 29.9 is considered overweight.  A BMI of 30 and above is considered obese.  Maintain normal blood lipids and cholesterol by exercising and minimizing your intake of saturated fat. Eat a balanced diet with plenty of fruits and vegetables. Blood tests for lipids and cholesterol should begin at age 25 and be repeated every 5 years. If your lipid or cholesterol levels are high, you are over age 43, or you are at high risk for heart disease, you may need your cholesterol levels  checked more frequently.Ongoing high lipid and cholesterol levels should be treated with medicines if diet and exercise are not working.  If you smoke, find out from your health care provider how to quit. If you do not use tobacco, do not start.  Lung cancer screening is recommended for adults aged 55-80 years who are at high risk for developing lung cancer because of a history of smoking. A yearly low-dose CT scan of the lungs is recommended for people who have at least a 30-pack-year history of smoking and are current smokers or have quit within the past 15 years. A pack year of smoking is smoking an average of 1 pack of cigarettes a day for 1 year (for example, a 30-pack-year history of smoking could mean smoking 1 pack a day for 30 years or 2 packs a day for 15 years). Yearly screening should continue until the smoker has stopped smoking for at least 15 years. Yearly screening should be stopped for people who develop a health problem that would prevent them from having lung cancer treatment.  If you choose to drink alcohol, do not have more than 2 drinks per day. One drink is considered to be 12 oz (360 mL) of beer, 5 oz (150 mL) of wine, or 1.5 oz (45 mL) of liquor.  Avoid the use of street drugs. Do not share needles with anyone. Ask for help if you need support or instructions about stopping the use of drugs.  High blood pressure causes heart disease and increases the  risk of stroke. Blood pressure should be checked at least every 1-2 years. Ongoing high blood pressure should be treated with medicines if weight loss and exercise are not effective.  If you are 41-29 years old, ask your health care provider if you should take aspirin to prevent heart disease.  Diabetes screening involves taking a blood sample to check your fasting blood sugar level. This should be done once every 3 years after age 57 if you are at a normal weight and without risk factors for diabetes. Testing should be considered  at a younger age or be carried out more frequently if you are overweight and have at least 1 risk factor for diabetes.  Colorectal cancer can be detected and often prevented. Most routine colorectal cancer screening begins at the age of 75 and continues through age 13. However, your health care provider may recommend screening at an earlier age if you have risk factors for colon cancer. On a yearly basis, your health care provider may provide home test kits to check for hidden blood in the stool. A small camera at the end of a tube may be used to directly examine the colon (sigmoidoscopy or colonoscopy) to detect the earliest forms of colorectal cancer. Talk to your health care provider about this at age 23 when routine screening begins. A direct exam of the colon should be repeated every 5-10 years through age 48, unless early forms of precancerous polyps or small growths are found.  People who are at an increased risk for hepatitis B should be screened for this virus. You are considered at high risk for hepatitis B if:  You were born in a country where hepatitis B occurs often. Talk with your health care provider about which countries are considered high risk.  Your parents were born in a high-risk country and you have not received a shot to protect against hepatitis B (hepatitis B vaccine).  You have HIV or AIDS.  You use needles to inject street drugs.  You live with, or have sex with, someone who has hepatitis B.  You are a man who has sex with other men (MSM).  You get hemodialysis treatment.  You take certain medicines for conditions like cancer, organ transplantation, and autoimmune conditions.  Hepatitis C blood testing is recommended for all people born from [birth year] through 1965 and any individual with known risk factors for hepatitis C.  Healthy men should no longer receive prostate-specific antigen (PSA) blood tests as part of routine cancer screening. Talk to your health care  provider about prostate cancer screening.  Testicular cancer screening is not recommended for adolescents or adult males who have no symptoms. Screening includes self-exam, a health care provider exam, and other screening tests. Consult with your health care provider about any symptoms you have or any concerns you have about testicular cancer.  Practice safe sex. Use condoms and avoid high-risk sexual practices to reduce the spread of sexually transmitted infections (STIs).  You should be screened for STIs, including gonorrhea and chlamydia if:  You are sexually active and are younger than 24 years.  You are older than 24 years, and your health care provider tells you that you are at risk for this type of infection.  Your sexual activity has changed since you were last screened, and you are at an increased risk for chlamydia or gonorrhea. Ask your health care provider if you are at risk.  If you are at risk of being infected with HIV, it is  recommended that you take a prescription medicine daily to prevent HIV infection. This is called pre-exposure prophylaxis (PrEP). You are considered at risk if:  You are a man who has sex with other men (MSM).  You are a heterosexual man who is sexually active with multiple partners.  You take drugs by injection.  You are sexually active with a partner who has HIV.  Talk with your health care provider about whether you are at high risk of being infected with HIV. If you choose to begin PrEP, you should first be tested for HIV. You should then be tested every 3 months for as long as you are taking PrEP.  Use sunscreen. Apply sunscreen liberally and repeatedly throughout the day. You should seek shade when your shadow is shorter than you. Protect yourself by wearing long sleeves, pants, a wide-brimmed hat, and sunglasses year round whenever you are outdoors.  Tell your health care provider of new moles or changes in moles, especially if there is a change  in shape or color. Also, tell your health care provider if a mole is larger than the size of a pencil eraser.  A one-time screening for abdominal aortic aneurysm (AAA) and surgical repair of large AAAs by ultrasound is recommended for men aged 65-75 years who are current or former smokers.  Stay current with your vaccines (immunizations). Document Released: 05/28/2008 Document Revised: 12/05/2013 Document Reviewed: 04/27/2011 [facility] Patient Information 2015 [place], [place]. This information is not intended to replace advice given to you by your health care provider. Make sure you discuss any questions you have with your health care provider.

## 2015-07-04 ENCOUNTER — Ambulatory Visit: Payer: Medicare Other | Admitting: Nurse Practitioner

## 2015-07-11 ENCOUNTER — Other Ambulatory Visit: Payer: Self-pay | Admitting: Nurse Practitioner

## 2015-07-12 ENCOUNTER — Other Ambulatory Visit: Payer: Self-pay | Admitting: Nurse Practitioner

## 2015-07-13 ENCOUNTER — Other Ambulatory Visit: Payer: Self-pay | Admitting: Nurse Practitioner

## 2015-07-16 ENCOUNTER — Ambulatory Visit: Payer: Medicare Other | Admitting: Neurology

## 2015-08-11 ENCOUNTER — Other Ambulatory Visit: Payer: Self-pay | Admitting: Nurse Practitioner

## 2015-08-28 ENCOUNTER — Ambulatory Visit: Payer: Medicare Other | Admitting: Neurology

## 2015-10-14 ENCOUNTER — Other Ambulatory Visit: Payer: Self-pay | Admitting: Nurse Practitioner

## 2015-11-20 ENCOUNTER — Other Ambulatory Visit: Payer: Self-pay | Admitting: Nurse Practitioner

## 2015-11-20 NOTE — Telephone Encounter (Signed)
Last seen 03/29/15 MMM 

## 2015-11-21 NOTE — Telephone Encounter (Signed)
Detailed message left for patient that he needs to be seen. 

## 2015-11-21 NOTE — Telephone Encounter (Signed)
Last refill without being seen 

## 2015-12-11 ENCOUNTER — Encounter: Payer: Self-pay | Admitting: Family Medicine

## 2015-12-11 ENCOUNTER — Ambulatory Visit (INDEPENDENT_AMBULATORY_CARE_PROVIDER_SITE_OTHER): Payer: Medicare Other | Admitting: Family Medicine

## 2015-12-11 VITALS — BP 137/69 | HR 61 | Temp 96.8°F | Ht 69.0 in | Wt 160.2 lb

## 2015-12-11 DIAGNOSIS — I10 Essential (primary) hypertension: Secondary | ICD-10-CM

## 2015-12-11 DIAGNOSIS — J411 Mucopurulent chronic bronchitis: Secondary | ICD-10-CM

## 2015-12-11 DIAGNOSIS — E785 Hyperlipidemia, unspecified: Secondary | ICD-10-CM | POA: Diagnosis not present

## 2015-12-11 MED ORDER — ASPIRIN-DIPYRIDAMOLE ER 25-200 MG PO CP12: ORAL_CAPSULE | ORAL | Status: DC

## 2015-12-11 MED ORDER — NEBIVOLOL HCL 5 MG PO TABS: 5.0000 mg | ORAL_TABLET | Freq: Every day | ORAL | Status: DC

## 2015-12-11 MED ORDER — ROSUVASTATIN CALCIUM 40 MG PO TABS: ORAL_TABLET | ORAL | Status: DC

## 2015-12-11 NOTE — Progress Notes (Signed)
   Subjective:    [name], male    DOB: [birth year], 69 y.o.   MRN: [id number]  HPI  69 year old gentleman who is here to follow-up hypertension hyperlipidemia and COPD. He generally is doing better than he was several years ago. He had 2 strokes within a week of one another. He has no residual effects from that.   he uses Spiriva and Advair and reports that his breathing has not changed in years. He does have poor exercise tolerance due to the COPD.  Blood pressure has been well controlled on single agent Bystolic. He denies side effects and is compliant.  Lipids were last checked in April of this year and were at goal with LDL being at 39. We will plan to repeat lipids and liver tests in April of next year.  Patient Active Problem List   Diagnosis Date Noted  . HTN (hypertension) 05/01/2013  . 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 Tobacco abuse 04/02/2013  . 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 Hyperlipidemia 03/16/2013  . CAD in native artery 03/16/2013  . Presence of stent in coronary artery 03/16/2013  . COPD (chronic obstructive pulmonary disease) (HCC) 03/16/2013   Outpatient Encounter Prescriptions as of 12/11/2015  Medication Sig  . BYSTOLIC 5 MG tablet TAKE 1 TABLET EVERY DAY  . CRESTOR 40 MG tablet TAKE 1 TABLET (40 MG TOTAL) BY MOUTH DAILY.  Marland Kitchen dipyridamole-aspirin (AGGRENOX) 200-25 MG per 12 hr capsule TAKE 1 CAPSULE BY MOUTH 2 (TWO) TIMES DAILY.  Marland Kitchen Fluticasone-Salmeterol (ADVAIR DISKUS) 250-50 MCG/DOSE AEPB Inhale 1 puff into the lungs every 12 (twelve) hours.  Marland Kitchen tiotropium (SPIRIVA HANDIHALER) 18 MCG inhalation capsule Place 1 capsule (18 mcg total) into inhaler and inhale daily.  . [DISCONTINUED] dipyridamole-aspirin (AGGRENOX) 200-25 MG per 12 hr capsule TAKE 1 CAPSULE BY MOUTH 2 (TWO) TIMES DAILY.   No facility-administered encounter medications on file as of 12/11/2015.      Review of Systems  Constitutional: Negative.   HENT: Negative.     Respiratory: Positive for shortness of breath.   Cardiovascular: Negative.   Neurological: Negative.   Psychiatric/Behavioral: Negative.        Objective:   Physical Exam  Constitutional: He is oriented to person, place, and time. He appears well-developed and well-nourished.  Cardiovascular: Normal rate, regular rhythm and normal heart sounds.   Pulmonary/Chest: Effort normal and breath sounds normal.  Abdominal: Soft.  Neurological: He is alert and oriented to person, place, and time. He displays normal reflexes. No cranial nerve deficit. Coordination normal.          Assessment & Plan:  1. Hyperlipidemia LDL is at goal. Repeat lipid studies in 4 months  2. Mucopurulent chronic bronchitis (HCC) Breathing issues are stable. Continue Advair and Spiriva  3. Essential hypertension Pressure is well controlled on Bystolic. Continue same treatment  Frederica Kuster MD

## 2016-04-10 ENCOUNTER — Ambulatory Visit (INDEPENDENT_AMBULATORY_CARE_PROVIDER_SITE_OTHER): Payer: Medicare Other | Admitting: Family Medicine

## 2016-04-10 ENCOUNTER — Encounter: Payer: Self-pay | Admitting: Family Medicine

## 2016-04-10 VITALS — BP 134/73 | HR 56 | Temp 96.7°F | Ht 69.0 in | Wt 159.0 lb

## 2016-04-10 DIAGNOSIS — J411 Mucopurulent chronic bronchitis: Secondary | ICD-10-CM

## 2016-04-10 DIAGNOSIS — I611 Nontraumatic intracerebral hemorrhage in hemisphere, cortical: Secondary | ICD-10-CM | POA: Diagnosis not present

## 2016-04-10 DIAGNOSIS — I10 Essential (primary) hypertension: Secondary | ICD-10-CM | POA: Diagnosis not present

## 2016-04-10 NOTE — Progress Notes (Signed)
   Subjective:    [name], male    DOB: [birth year], 70 y.o.   MRN: [id number]  HPI 70 year old gentleman with history of cerebrovascular disease hypertension and COPD. Even though he has some limitations in terms of exercise and due to his breathing he really has no complaints today. He feels much better than he did a year ago. I think he is a little bored that he cannot still work in [employer].  Patient Active Problem List   Diagnosis Date Noted  . HTN (hypertension) 05/01/2013  . 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 Tobacco abuse 04/02/2013  . 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 Hyperlipidemia 03/16/2013  . CAD in native artery 03/16/2013  . Presence of stent in coronary artery 03/16/2013  . COPD (chronic obstructive pulmonary disease) (HCC) 03/16/2013   Outpatient Encounter Prescriptions as of 04/10/2016  Medication Sig  . dipyridamole-aspirin (AGGRENOX) 200-25 MG 12hr capsule TAKE 1 CAPSULE BY MOUTH 2 (TWO) TIMES DAILY.  Marland Kitchen Fluticasone-Salmeterol (ADVAIR DISKUS) 250-50 MCG/DOSE AEPB Inhale 1 puff into the lungs every 12 (twelve) hours.  . nebivolol (BYSTOLIC) 5 MG tablet Take 1 tablet (5 mg total) by mouth daily.  . rosuvastatin (CRESTOR) 40 MG tablet TAKE 1 TABLET (40 MG TOTAL) BY MOUTH DAILY.  Marland Kitchen tiotropium (SPIRIVA HANDIHALER) 18 MCG inhalation capsule Place 1 capsule (18 mcg total) into inhaler and inhale daily.   No facility-administered encounter medications on file as of 04/10/2016.      Review of Systems  Constitutional: Positive for activity change.  Respiratory: Positive for shortness of breath.   Cardiovascular: Negative.   Neurological: Negative.   Psychiatric/Behavioral: Negative.        Objective:   Physical Exam  Constitutional: He is oriented to person, place, and time. He appears well-developed and well-nourished.  Cardiovascular: Normal rate and regular rhythm.   Heart sounds are distant consistent with COPD  Pulmonary/Chest:  Effort normal and breath sounds normal. He has no wheezes. He has no rales.  Neurological: He is alert and oriented to person, place, and time.  There are no obvious chronic deficits          Assessment & Plan:  1. Essential hypertension Pressure is well controlled on Bystolic. Continue same  2. Mucopurulent chronic bronchitis (HCC) He has chronic productive cough in the mornings and probably has some elements of chronic bronchitis  3. Nontraumatic cortical hemorrhage of cerebral hemisphere, unspecified laterality (HCC) Obvious deficits on exam. Continue with dipyridamole and aspirin, rosuvastatin, and nebivolol  Frederica Kuster MD

## 2016-07-16 ENCOUNTER — Other Ambulatory Visit: Payer: Self-pay | Admitting: Family Medicine

## 2016-08-13 ENCOUNTER — Encounter: Payer: Self-pay | Admitting: Family Medicine

## 2016-08-13 ENCOUNTER — Ambulatory Visit (INDEPENDENT_AMBULATORY_CARE_PROVIDER_SITE_OTHER): Payer: Medicare Other | Admitting: Family Medicine

## 2016-08-13 VITALS — BP 134/71 | HR 64 | Temp 96.8°F | Ht 69.0 in | Wt 155.6 lb

## 2016-08-13 DIAGNOSIS — J411 Mucopurulent chronic bronchitis: Secondary | ICD-10-CM

## 2016-08-13 DIAGNOSIS — E785 Hyperlipidemia, unspecified: Secondary | ICD-10-CM | POA: Diagnosis not present

## 2016-08-13 DIAGNOSIS — I10 Essential (primary) hypertension: Secondary | ICD-10-CM

## 2016-08-13 NOTE — Progress Notes (Signed)
   Subjective:    [name], male    DOB: July 10, 1946, 70 y.o.   MRN: [id number]  HPI 70 year old gentleman who is here to follow-up blood pressure lipids and stroke that he had about 3 years ago. Medicines are limited to blood pressure cholesterol and platelet inhibitors along with 2 inhalers for his COPD.Marland Kitchen He reports to feeling well. His wife seems to revolve a lot around twin granddaughters that he really dotes on.  Patient Active Problem List   Diagnosis Date Noted  . HTN (hypertension) 05/01/2013  . 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 Tobacco abuse 04/02/2013  . 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 Hyperlipidemia 03/16/2013  . CAD in native artery 03/16/2013  . Presence of stent in coronary artery 03/16/2013  . COPD (chronic obstructive pulmonary disease) (HCC) 03/16/2013   Outpatient Encounter Prescriptions as of 08/13/2016  Medication Sig  . dipyridamole-aspirin (AGGRENOX) 200-25 MG 12hr capsule TAKE 1 CAPSULE BY MOUTH 2 (TWO) TIMES DAILY.  Marland Kitchen Fluticasone-Salmeterol (ADVAIR DISKUS) 250-50 MCG/DOSE AEPB Inhale 1 puff into the lungs every 12 (twelve) hours.  . nebivolol (BYSTOLIC) 5 MG tablet Take 1 tablet (5 mg total) by mouth daily.  . rosuvastatin (CRESTOR) 40 MG tablet TAKE 1 TABLET (40 MG TOTAL) BY MOUTH DAILY.  Marland Kitchen tiotropium (SPIRIVA HANDIHALER) 18 MCG inhalation capsule Place 1 capsule (18 mcg total) into inhaler and inhale daily.   No facility-administered encounter medications on file as of 08/13/2016.       Review of Systems  Constitutional: Negative.   HENT: Negative.   Eyes: Negative.   Respiratory: Positive for shortness of breath.   Cardiovascular: Negative.  Negative for chest pain and leg swelling.  Gastrointestinal: Negative.   Genitourinary: Negative.   Musculoskeletal: Negative.   Skin: Negative.   Neurological: Negative.   Psychiatric/Behavioral: Negative.   All other systems reviewed and are negative.      Objective:   Physical Exam  Constitutional: He is oriented to person, place, and time. He appears well-developed and well-nourished.  Cardiovascular: Normal rate, regular rhythm and normal heart sounds.   Pulmonary/Chest: Effort normal and breath sounds normal.  Neurological: He is alert and oriented to person, place, and time.  Psychiatric: He has a normal mood and affect. His behavior is normal.   There were no vitals taken for this visit.        Assessment & Plan:  1. Mucopurulent chronic bronchitis (HCC) COPD has not been bad. There've been no recent exacerbations symptoms are well controlled with current inhalers  2. Hyperlipidemia Lipids were checked at last visit are and at target  3. Essential hypertension Blood pressure  is well controlled on Bystolic today 134/71  Frederica Kuster MD

## 2016-08-18 ENCOUNTER — Other Ambulatory Visit: Payer: Self-pay | Admitting: Family Medicine

## 2016-10-10 ENCOUNTER — Other Ambulatory Visit: Payer: Self-pay | Admitting: Family Medicine

## 2016-10-20 ENCOUNTER — Ambulatory Visit (INDEPENDENT_AMBULATORY_CARE_PROVIDER_SITE_OTHER): Payer: Medicare Other | Admitting: *Deleted

## 2016-10-20 VITALS — BP 120/72 | HR 62 | Ht 67.25 in | Wt 158.0 lb

## 2016-10-20 DIAGNOSIS — Z0000 Encounter for general adult medical examination without abnormal findings: Secondary | ICD-10-CM | POA: Diagnosis not present

## 2016-10-20 NOTE — Patient Instructions (Signed)
  Mr. Luis Salinas , Thank you for taking time to come for your Medicare Wellness Visit. I appreciate your ongoing commitment to your health goals. Please review the following plan we discussed and let me know if I can assist you in the future.   These are the goals we discussed: Goals    . Exercise 3x per week (30 min per time)          Walk for 30 minutes 3 times a week at a reasonable pace       This is a list of the screening recommended for you and due dates:  Health Maintenance  Topic Date Due  .  Hepatitis C: One time screening is recommended by Center for Disease Control  (CDC) for  adults born from [birth year] through 1966.   [birth year]  . Flu Shot  03/13/2017*  . Shingles Vaccine  10/20/2017*  . Colon Cancer Screening  12/28/2019  . Tetanus Vaccine  12/28/2023  . Pneumonia vaccines  Completed  *Topic was postponed. The date shown is not the original due date.

## 2016-10-20 NOTE — Progress Notes (Addendum)
Subjective:   Luis Salinas is a 70 y.o. male who presents for a subsequent Medicare Annual Wellness Visit. Luis Salinas lives at home with his wife and dog. He has twin granddaughters that he enjoys spending time with. 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 He attends church regularly but doesn't participate in any other community groups.   Review of Systems  Patient reports that his health is much better this year than last year.   Musculoskeletal: Complains of multiple joint pains. Pain in knees, shoulder and neck. He has had multiple surgeries and replacements and doesn't wish to follow up with anyone about this at this time.   Cardiac Risk Factors include: advanced age (>78men, >82 women);male gender;dyslipidemia;hypertension   Other systems negative.     Objective:    Today's Vitals   10/20/16 0854  BP: 120/72  Pulse: 62  Weight: 158 lb (71.7 kg)  Height: 5' 7.25" (1.708 m)   Body mass index is 24.56 kg/m.  Current Medications (verified) Outpatient Encounter Prescriptions as of 10/20/2016  Medication Sig  . BYSTOLIC 5 MG tablet TAKE 1 TABLET BY MOUTH EVERY DAY  . dipyridamole-aspirin (AGGRENOX) 200-25 MG 12hr capsule TAKE 1 CAPSULE BY MOUTH 2 (TWO) TIMES DAILY.  Marland Kitchen Fluticasone-Salmeterol (ADVAIR DISKUS) 250-50 MCG/DOSE AEPB Inhale 1 puff into the lungs every 12 (twelve) hours.  . rosuvastatin (CRESTOR) 40 MG tablet TAKE 1 TABLET (40 MG TOTAL) BY MOUTH DAILY.  Marland Kitchen tiotropium (SPIRIVA HANDIHALER) 18 MCG inhalation capsule Place 1 capsule (18 mcg total) into inhaler and inhale daily.   No facility-administered encounter medications on file as of 10/20/2016.     Allergies (verified) Patient has no known allergies.   History: Past Medical History:  Diagnosis Date  . Amnesia    He was found and reuintied with his family in 31 after 27 years.  . 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 His family found  him after 27 years  . Arthritis   . Concussion    hx of  . COPD (chronic obstructive pulmonary disease) (HCC)   . Myocardial infarction    2007 [facility]  . Pneumonia    exposure to caustic chemicals  . Stroke [facility])    multiple strokes   Past Surgical History:  Procedure Laterality Date  . ANTERIOR CERVICAL DECOMP/DISCECTOMY FUSION  05/16/2012   Procedure: ANTERIOR CERVICAL DECOMPRESSION/DISCECTOMY FUSION 2 LEVELS;  Surgeon: Barnett Abu, MD;  Location: [facility];  Service: Neurosurgery;  Laterality: N/A;  Cervical Five-Six, Cervical Six-Seven Anterior Cervical Decompression/Diskectomy Fusion  . CARDIAC CATHETERIZATION    . COLONOSCOPY W/ POLYPECTOMY    . CORONARY ANGIOPLASTY    . JOINT REPLACEMENT     Left knee replacement  . JOINT REPLACEMENT     left elbow at age 53  . ROTATOR CUFF REPAIR     left  . TONSILLECTOMY     Family History  Problem Relation Age of Onset  . Heart disease Mother   . COPD Father   . Aneurysm Father   . Anesthesia problems Neg Hx   . Hypotension Neg Hx   . Malignant hyperthermia Neg Hx   . Pseudochol deficiency Neg Hx    Social History   Occupational History  . Retired    Social History Main Topics  . Smoking status: Light Tobacco Smoker    Types: Cigars    Last attempt to quit: 04/03/2013  .  Smokeless tobacco: Never Used     Comment: Smoked cigars for years after quitting cigarettes.    . Alcohol use No  . Drug use: No  . Sexual activity: Yes   Tobacco Counseling Smokes an occasional cigar but has plans to quit this week. His granddaughters asked him to quit on their 7th birthday.   Activities of Daily Living In your present state of health, do you have any difficulty performing the following activities: 10/20/2016  Hearing? N  Vision? N  Difficulty concentrating or making decisions? N  Walking or climbing stairs? Y  Dressing or bathing? N  Doing errands, shopping? N  Preparing Food and eating ? N  Using the Toilet? N  In the past  six months, have you accidently leaked urine? N  Do you have problems with loss of bowel control? N  Managing your Medications? N  Managing your Finances? N  Housekeeping or managing your Housekeeping? N  Some recent data might be hidden    Immunizations and Health Maintenance Immunization History  Administered Date(s) Administered  . Pneumococcal Conjugate-13 03/29/2015  . Pneumococcal Polysaccharide-23 10/14/2002, 12/27/2013  . Td 12/14/2008   There are no preventive care reminders to display for this patient.  Patient Care Team: Frederica KusterStephen M Miller, MD as PCP - General (Family Medicine) Micki RileyPramod S Sethi, MD as Consulting Physician (Neurology)  Eye exam 6 months ago with [facility] in [place].    Assessment:   This is a routine wellness examination for Luis Salinas.   Hearing/Vision screen No hearing or vision deficits noted during visit  Dietary issues and exercise activities discussed: Current Exercise Habits: Home exercise routine, Type of exercise: walking, Time (Minutes): 30, Frequency (Times/Week): 7, Weekly Exercise (Minutes/Week): 210, Intensity: Mild, Exercise limited by: orthopedic condition(s)  Goals    . Exercise 3x per week (30 min per time)          Walk for 30 minutes 3 times a week at a reasonable pace      Patient eats small frequent meals/snacks throughout the day.  Depression Screen PHQ 2/9 Scores 10/20/2016 08/13/2016 04/10/2016 05/17/2015  PHQ - 2 Score 0 0 0 0    Fall Risk Fall Risk  10/20/2016 08/13/2016 04/10/2016 05/17/2015 03/29/2015  Falls in the past year? No No No No No    Cognitive Function: MMSE - Mini Mental State Exam 10/20/2016 05/17/2015  Orientation to time 5 5  Orientation to Place 5 5  Registration 3 3  Attention/ Calculation 3 5  Recall 3 3  Language- name 2 objects 2 2  Language- repeat 1 1  Language- follow 3 step command 3 3  Language- read & follow direction 1 1  Write a sentence 1 1  Copy design 1 1  Total score 28 30   Normal exam        Screening Tests Health Maintenance  Topic Date Due  . Hepatitis C Screening  12/28/2016 (Originally December 09, 1946)  . INFLUENZA VACCINE  03/13/2017 (Originally 07/14/2016)  . ZOSTAVAX  10/20/2017 (Originally 11/12/2005)  . COLONOSCOPY  12/28/2019  . TETANUS/TDAP  12/28/2023  . PNA vac Low Risk Adult  Completed        Plan:   Keep f/u with Dr Hyacinth MeekerMiller 02/11/2017  Review advanced directives and bring a signed and notarized copy to our office   Walk daily for 30 minutes as tolerated.  During the course of the visit Luis Salinas was educated and counseled about the following appropriate screening and preventive services:  Vaccines to include Pneumoccal-up to date, Influenza-declined, Td-up to date, Zostavax-declined  Colorectal cancer screening-up to date  Cardiovascular disease screening-Due at next visit. Taking Crestor daily  Diabetes screening-due at next visit  Glaucoma screening-up to date within last 6 months  Nutrition counseling  Prostate cancer screening-Needs PSA at next visit  Smoking Cessation counseling  Patient Instructions (the written plan) were given to the patient.  I have reviewed and agree with the above AWV documentation.  I have reviewed and agree with the above AWV documentation.    Frederica Kuster MD Demetrios Loll, RN   10/20/2016

## 2016-10-22 ENCOUNTER — Ambulatory Visit: Payer: Medicare Other

## 2017-01-11 ENCOUNTER — Other Ambulatory Visit: Payer: Self-pay | Admitting: Family Medicine

## 2017-02-11 ENCOUNTER — Other Ambulatory Visit: Payer: Self-pay | Admitting: Family Medicine

## 2017-02-11 ENCOUNTER — Ambulatory Visit: Payer: Medicare Other | Admitting: Family Medicine

## 2017-02-15 NOTE — Progress Notes (Signed)
   Subjective:    [name], male    DOB: [birth year], 71 y.o.   MRN: [id number]  HPI 71 year old gentleman here to follow-up hypertension and lipids. He raises issue of some memory today. I believe we have discussed this before but he raises it Tammy that his memory is doing better than it previously was. He attributes this to stimulation from his grandchildren. He has no specific complaints today. He uses any inhalers that he has such as Spiriva or Symbicort sparingly.  Patient Active Problem List   Diagnosis Date Noted  . HTN (hypertension) 05/01/2013  . 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 Tobacco abuse 04/02/2013  . 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 Hyperlipidemia 03/16/2013  . CAD in native artery 03/16/2013  . Presence of stent in coronary artery 03/16/2013  . COPD (chronic obstructive pulmonary disease) ([place]) 03/16/2013   Outpatient Encounter Prescriptions as of 02/16/2017  Medication Sig  . BYSTOLIC 5 MG tablet TAKE 1 TABLET BY MOUTH EVERY DAY  . Fluticasone-Salmeterol (ADVAIR DISKUS) 250-50 MCG/DOSE AEPB Inhale 1 puff into the lungs every 12 (twelve) hours.  . rosuvastatin (CRESTOR) 40 MG tablet TAKE 1 TABLET (40 MG TOTAL) BY MOUTH DAILY.  Marland Kitchen tiotropium (SPIRIVA HANDIHALER) 18 MCG inhalation capsule Place 1 capsule (18 mcg total) into inhaler and inhale daily.  . [DISCONTINUED] dipyridamole-aspirin (AGGRENOX) 200-25 MG 12hr capsule TAKE 1 CAPSULE BY MOUTH 2 (TWO) TIMES DAILY.   No facility-administered encounter medications on file as of 02/16/2017.       Review of Systems  Constitutional: Negative.   Respiratory: Negative.   Cardiovascular: Negative.   Neurological: Negative.   Psychiatric/Behavioral: Negative.        Objective:   Physical Exam  Constitutional: He is oriented to person, place, and time. He appears well-developed and well-nourished.  Cardiovascular: Normal rate, regular rhythm and normal heart sounds.   Pulmonary/Chest:  Effort normal.  Abdominal: Soft.  Neurological: He is alert and oriented to person, place, and time.  Psychiatric: He has a normal mood and affect. His behavior is normal.   BP (!) 141/75   Pulse 62   Temp 98 F (36.7 C) (Oral)   Ht 5' 7.25" (1.708 m)   Wt 158 lb 12.8 oz (72 kg)   BMI 24.69 kg/m         Assessment & Plan:  1. Essential hypertension Blood pressure is a little elevated today. I suggested he require blood pressure cuff and monitor home blood pressures - CMP14+EGFR  2. Hyperlipidemia, unspecified hyperlipidemia type Lipids were at goal when last assessed about one year ago - Lipid panel  3. Mucopurulent chronic bronchitis (HCC) Breathing is actually doing well. As noted above, uses inhalers intermittently.  Wardell Honour MD

## 2017-02-16 ENCOUNTER — Ambulatory Visit (INDEPENDENT_AMBULATORY_CARE_PROVIDER_SITE_OTHER): Payer: Medicare Other | Admitting: Family Medicine

## 2017-02-16 ENCOUNTER — Encounter: Payer: Self-pay | Admitting: Family Medicine

## 2017-02-16 VITALS — BP 141/75 | HR 62 | Temp 98.0°F | Ht 67.25 in | Wt 158.8 lb

## 2017-02-16 DIAGNOSIS — I10 Essential (primary) hypertension: Secondary | ICD-10-CM

## 2017-02-16 DIAGNOSIS — J411 Mucopurulent chronic bronchitis: Secondary | ICD-10-CM | POA: Diagnosis not present

## 2017-02-16 DIAGNOSIS — E785 Hyperlipidemia, unspecified: Secondary | ICD-10-CM

## 2017-02-17 ENCOUNTER — Telehealth: Payer: Self-pay | Admitting: *Deleted

## 2017-02-17 LAB — CMP14+EGFR
A/G RATIO: 1.6 (ref 1.2–2.2)
ALBUMIN: 4.2 g/dL (ref 3.5–4.8)
ALK PHOS: 112 IU/L (ref 39–117)
ALT: 19 IU/L (ref 0–44)
AST: 19 IU/L (ref 0–40)
BILIRUBIN TOTAL: 0.3 mg/dL (ref 0.0–1.2)
BUN / CREAT RATIO: 17 (ref 10–24)
BUN: 16 mg/dL (ref 8–27)
CHLORIDE: 101 mmol/L (ref 96–106)
CO2: 25 mmol/L (ref 18–29)
Calcium: 9.1 mg/dL (ref 8.6–10.2)
Creatinine, Ser: 0.94 mg/dL (ref 0.76–1.27)
GFR calc Af Amer: 94 mL/min/{1.73_m2} (ref >59)
GFR calc non Af Amer: 81 mL/min/{1.73_m2} (ref >59)
GLOBULIN, TOTAL: 2.6 g/dL (ref 1.5–4.5)
Glucose: 114 mg/dL — ABNORMAL HIGH (ref 65–99)
POTASSIUM: 4.6 mmol/L (ref 3.5–5.2)
SODIUM: 143 mmol/L (ref 134–144)
Total Protein: 6.8 g/dL (ref 6.0–8.5)

## 2017-02-17 LAB — LIPID PANEL
CHOLESTEROL TOTAL: 100 mg/dL (ref 100–199)
Chol/HDL Ratio: 2.6 ratio units (ref 0.0–5.0)
HDL: 39 mg/dL — ABNORMAL LOW (ref >39)
LDL CALC: 43 mg/dL (ref 0–99)
TRIGLYCERIDES: 88 mg/dL (ref 0–149)
VLDL Cholesterol Cal: 18 mg/dL (ref 5–40)

## 2017-02-17 NOTE — Telephone Encounter (Signed)
Pt notified of results Verbalizes understanding Per pt he had eaten 2 sausage biscuits prior to labs

## 2017-02-17 NOTE — Telephone Encounter (Signed)
-----   Message from Frederica Kuster, MD sent at 02/17/2017  7:45 AM EST ----- Metabolic panel shows glucose to be elevated in the prediabetes range. Lipid panel is very good and basically unchanged from 3 years ago

## 2017-04-12 ENCOUNTER — Other Ambulatory Visit: Payer: Self-pay | Admitting: Family Medicine

## 2017-05-10 ENCOUNTER — Other Ambulatory Visit: Payer: Self-pay | Admitting: Family Medicine

## 2017-06-11 ENCOUNTER — Ambulatory Visit (INDEPENDENT_AMBULATORY_CARE_PROVIDER_SITE_OTHER): Payer: Medicare Other | Admitting: Nurse Practitioner

## 2017-06-11 ENCOUNTER — Ambulatory Visit (INDEPENDENT_AMBULATORY_CARE_PROVIDER_SITE_OTHER): Payer: Medicare Other

## 2017-06-11 ENCOUNTER — Telehealth: Payer: Self-pay | Admitting: Nurse Practitioner

## 2017-06-11 ENCOUNTER — Encounter: Payer: Self-pay | Admitting: Nurse Practitioner

## 2017-06-11 VITALS — BP 124/67 | HR 67 | Temp 98.6°F | Ht 67.0 in | Wt 155.0 lb

## 2017-06-11 DIAGNOSIS — M79642 Pain in left hand: Secondary | ICD-10-CM | POA: Diagnosis not present

## 2017-06-11 MED ORDER — NAPROXEN 500 MG PO TABS: 500.0000 mg | ORAL_TABLET | Freq: Two times a day (BID) | ORAL | 1 refills | Status: DC

## 2017-06-11 NOTE — Telephone Encounter (Signed)
Naprosyn is not aspirirn- as long as aspirin is a baby asa

## 2017-06-11 NOTE — Telephone Encounter (Signed)
[facility] for [facility] to [facility] per MMM

## 2017-06-11 NOTE — Telephone Encounter (Signed)
Please review and advise.

## 2017-06-11 NOTE — Progress Notes (Signed)
   Subjective:    [name], male    DOB: 1946/10/05, 71 y.o.   MRN: [id number]  HPI Patient is in the office with complaints of left hand pain.  Patient states he has had no injury to his left hand.  Patient states he is unable to use his left hand since this morning.  He states he first noticed the pain this morning when he was unable to pick up your coffee cup.  Pain is described as throbbing in the lateral two fingers and around his wrist.  Tried to pick up a broom and was unable to grip it with the left hand.  Pain rated at 8/10.  Patient has not taken anything for the pain yet and states he feels he has started to get his grip strength back since being in the office.  Patient has a history of strokes and states these symptoms are not the same as before.   Review of Systems  Constitutional: Negative for activity change and appetite change.  Respiratory: Negative for cough and shortness of breath.   Cardiovascular: Negative for chest pain and palpitations.  Musculoskeletal: Positive for arthralgias (left wrist).  Neurological: Negative for dizziness, numbness and headaches.  All other systems reviewed and are negative.      Objective:   Physical Exam  Constitutional: He is oriented to person, place, and time. He appears well-developed and well-nourished. No distress.  HENT:  Head: Normocephalic.  Right Ear: External ear normal.  Left Ear: External ear normal.  Eyes: Pupils are equal, round, and reactive to light.  Neck: Normal range of motion. Neck supple. No JVD present. No thyromegaly present.  Cardiovascular: Normal rate, regular rhythm, normal heart sounds and intact distal pulses.   No murmur heard. Pulmonary/Chest: Effort normal and breath sounds normal. No respiratory distress. He has no wheezes.  Abdominal: Soft. Bowel sounds are normal. He exhibits no distension. There is no tenderness.  Musculoskeletal:       Right hand: Normal.       Left hand: He  exhibits decreased range of motion (pain with flexion and extension in hand and wrist) and tenderness. Decreased strength (with gripping) noted.  Lymphadenopathy:    He has no cervical adenopathy.  Neurological: He is alert and oriented to person, place, and time. He has normal reflexes. No cranial nerve deficit.  Skin: Skin is warm and dry.  Psychiatric: He has a normal mood and affect. His behavior is normal. Judgment and thought content normal.   BP 124/67   Pulse 67   Temp 98.6 F (37 C) (Oral)   Ht 5\' 7"  (1.702 m)   Wt 155 lb (70.3 kg)   BMI 24.28 kg/m         Assessment & Plan:   1. Left hand pain    Meds ordered this encounter  Medications  . naproxen (NAPROSYN) 500 MG tablet    Sig: Take 1 tablet (500 mg total) by mouth 2 (two) times daily with a meal.    Dispense:  60 tablet    Refill:  1    Order Specific Question:   Supervising Provider    Answer:   VINCENT, CAROL L [[id number]]   Moist heat to wrist and hand Ace wrap if helps If weakness continues and leg becomes weak( signs of stroke)- TO ER ASAP rto prn  Mary-Margaret Daphine Deutscher, FNP

## 2017-06-15 DIAGNOSIS — H25013 Cortical age-related cataract, bilateral: Secondary | ICD-10-CM | POA: Diagnosis not present

## 2017-06-15 DIAGNOSIS — H5203 Hypermetropia, bilateral: Secondary | ICD-10-CM | POA: Diagnosis not present

## 2017-07-06 ENCOUNTER — Other Ambulatory Visit: Payer: Self-pay | Admitting: Family Medicine

## 2017-07-06 MED ORDER — NITROGLYCERIN 0.4 MG SL SUBL: 0.4000 mg | SUBLINGUAL_TABLET | SUBLINGUAL | 3 refills | Status: DC | PRN

## 2017-07-06 NOTE — Telephone Encounter (Signed)
What is the name of the medication? Nitro Never filled in [employer] [employer].  Have you contacted your pharmacy to request a refill? NO  Which [facility] [place]   Patient notified that their request is being sent to the clinical staff for review and that they should receive a call once it is complete. If they do not receive a call within 24 hours they can check with their pharmacy or our office.

## 2017-07-06 NOTE — Telephone Encounter (Signed)
Refill sent to pharmacy.   

## 2017-07-12 ENCOUNTER — Other Ambulatory Visit: Payer: Self-pay | Admitting: Family Medicine

## 2017-07-23 ENCOUNTER — Other Ambulatory Visit: Payer: Self-pay | Admitting: *Deleted

## 2017-07-23 DIAGNOSIS — M79642 Pain in left hand: Secondary | ICD-10-CM

## 2017-07-23 MED ORDER — NAPROXEN 500 MG PO TABS: 500.0000 mg | ORAL_TABLET | Freq: Two times a day (BID) | ORAL | 1 refills | Status: DC

## 2017-08-09 ENCOUNTER — Other Ambulatory Visit: Payer: Self-pay | Admitting: Family Medicine

## 2017-08-19 ENCOUNTER — Encounter: Payer: Self-pay | Admitting: Family Medicine

## 2017-08-19 ENCOUNTER — Ambulatory Visit (INDEPENDENT_AMBULATORY_CARE_PROVIDER_SITE_OTHER): Payer: Medicare Other | Admitting: Family Medicine

## 2017-08-19 ENCOUNTER — Telehealth: Payer: Self-pay | Admitting: Family Medicine

## 2017-08-19 VITALS — BP 135/74 | HR 66 | Temp 97.1°F | Ht 67.0 in | Wt 150.0 lb

## 2017-08-19 DIAGNOSIS — Z720 Tobacco use: Secondary | ICD-10-CM

## 2017-08-19 DIAGNOSIS — I10 Essential (primary) hypertension: Secondary | ICD-10-CM | POA: Diagnosis not present

## 2017-08-19 DIAGNOSIS — J411 Mucopurulent chronic bronchitis: Secondary | ICD-10-CM

## 2017-08-19 DIAGNOSIS — E782 Mixed hyperlipidemia: Secondary | ICD-10-CM

## 2017-08-19 DIAGNOSIS — R739 Hyperglycemia, unspecified: Secondary | ICD-10-CM

## 2017-08-19 LAB — BAYER DCA HB A1C WAIVED: HB A1C: 6.1 % (ref <7.0)

## 2017-08-19 NOTE — Telephone Encounter (Signed)
Patient aware of results.

## 2017-08-19 NOTE — Progress Notes (Signed)
BP 135/74   Pulse 66   Temp (!) 97.1 F (36.2 C) (Oral)   Ht 5' 7"  (1.702 m)   Wt 150 lb (68 kg)   BMI 23.49 kg/m    Subjective:    [name], male    DOB: Jun 17, 1946, 71 y.o.   MRN: [id number]  HPI: Luis Salinas is a 71 y.o. male presenting on 08/19/2017 for Hyperlipidemia (6 mo, patient is not fasting) and Hypertension   HPI Hyperlipidemia Patient is coming in for recheck of his hyperlipidemia. The patient is currently taking Crestor 40 mg. They deny any issues with myalgias or history of liver damage from it. They deny any focal numbness or weakness or chest pain.   Hypertension Patient is currently on Bystolic, and their blood pressure today is 135/74. Patient denies any lightheadedness or dizziness. Patient denies headaches, blurred vision, chest pains, shortness of breath, or weakness. Denies any side effects from medication and is content with current medication.   COPD and tobacco abuse Patient still smokes about 1-2 cigars a day but has come down drastically from when he used to smoke a pack per day of cigarettes. He does have some coughing but denies any major wheezing or shortness of breath. He only uses the Advair and Spiriva when he starts feeling issues, but does not use them every day consistently. He denies any issues today and says he is feeling very well.  Elevated blood sugar on last blood draw Patient had elevated blood sugar on his last blood draw and we will check a hemoglobin A1c today  Relevant past medical, surgical, family and social history reviewed and updated as indicated. Interim medical history since our last visit reviewed. Allergies and medications reviewed and updated.  Review of Systems  Constitutional: Negative for chills and fever.  HENT: Negative for congestion, ear pain and tinnitus.   Eyes: Negative for pain and discharge.  Respiratory: Positive for cough. Negative for shortness of breath and wheezing.   Cardiovascular:  Negative for chest pain, palpitations and leg swelling.  Gastrointestinal: Negative for abdominal pain, blood in stool, constipation and diarrhea.  Genitourinary: Negative for dysuria and hematuria.  Musculoskeletal: Negative for back pain, gait problem and myalgias.  Skin: Negative for rash.  Neurological: Negative for dizziness, weakness and headaches.  Psychiatric/Behavioral: Negative for suicidal ideas.  All other systems reviewed and are negative.   Per HPI unless specifically indicated above        Objective:    BP 135/74   Pulse 66   Temp (!) 97.1 F (36.2 C) (Oral)   Ht 5' 7"  (1.702 m)   Wt 150 lb (68 kg)   BMI 23.49 kg/m   Wt Readings from Last 3 Encounters:  08/19/17 150 lb (68 kg)  06/11/17 155 lb (70.3 kg)  02/16/17 158 lb 12.8 oz (72 kg)    Physical Exam  Constitutional: He is oriented to person, place, and time. He appears well-developed and well-nourished. No distress.  Eyes: Conjunctivae are normal. No scleral icterus.  Neck: Neck supple. No thyromegaly present.  Cardiovascular: Normal rate, regular rhythm, normal heart sounds and intact distal pulses.   No murmur heard. Pulmonary/Chest: Effort normal and breath sounds normal. No respiratory distress. He has no wheezes. He has no rales.  Musculoskeletal: Normal range of motion. He exhibits no edema.  Lymphadenopathy:    He has no cervical adenopathy.  Neurological: He is alert and oriented to person, place, and time. Coordination normal.  Skin:  Skin is warm and dry. No rash noted. He is not diaphoretic.  Psychiatric: He has a normal mood and affect. His behavior is normal.  Nursing note and vitals reviewed.   Results for orders placed or performed in visit on 02/16/17  CMP14+EGFR  Result Value Ref Range   Glucose 114 (H) 65 - 99 mg/dL   BUN 16 8 - 27 mg/dL   Creatinine, Ser 0.94 0.76 - 1.27 mg/dL   GFR calc non Af Amer 81 >59 mL/min/1.73   GFR calc Af Amer 94 >59 mL/min/1.73   BUN/Creatinine  Ratio 17 10 - 24   Sodium 143 134 - 144 mmol/L   Potassium 4.6 3.5 - 5.2 mmol/L   Chloride 101 96 - 106 mmol/L   CO2 25 18 - 29 mmol/L   Calcium 9.1 8.6 - 10.2 mg/dL   Total Protein 6.8 6.0 - 8.5 g/dL   Albumin 4.2 3.5 - 4.8 g/dL   Globulin, Total 2.6 1.5 - 4.5 g/dL   Albumin/Globulin Ratio 1.6 1.2 - 2.2   Bilirubin Total 0.3 0.0 - 1.2 mg/dL   Alkaline Phosphatase 112 39 - 117 IU/L   AST 19 0 - 40 IU/L   ALT 19 0 - 44 IU/L  Lipid panel  Result Value Ref Range   Cholesterol, Total 100 100 - 199 mg/dL   Triglycerides 88 0 - 149 mg/dL   HDL 39 (L) >39 mg/dL   VLDL Cholesterol Cal 18 5 - 40 mg/dL   LDL Calculated 43 0 - 99 mg/dL   Chol/HDL Ratio 2.6 0.0 - 5.0 ratio units      Assessment & Plan:   Problem List Items Addressed This Visit      Cardiovascular and Mediastinum   HTN (hypertension)     Respiratory   COPD (chronic obstructive pulmonary disease) ([place])     Other   Hyperlipidemia - Primary   Tobacco abuse    Other Visit Diagnoses    Elevated blood sugar       Relevant Orders   Bayer DCA Hb A1c Waived       Follow up plan: Return in about 6 months (around 02/16/2018), or if symptoms worsen or fail to improve, for Hypertension and cholesterol.  Counseling provided for all of the vaccine components Orders Placed This Encounter  Procedures  . Bayer [facility] Hb A1c [place], MD [facility] 08/19/2017, 9:39 AM

## 2017-10-11 ENCOUNTER — Other Ambulatory Visit: Payer: Self-pay | Admitting: Family Medicine

## 2017-10-28 ENCOUNTER — Other Ambulatory Visit: Payer: Self-pay | Admitting: *Deleted

## 2017-10-28 DIAGNOSIS — M79642 Pain in left hand: Secondary | ICD-10-CM

## 2017-10-28 MED ORDER — NAPROXEN 500 MG PO TABS: 500.0000 mg | ORAL_TABLET | Freq: Two times a day (BID) | ORAL | 1 refills | Status: DC

## 2017-11-15 ENCOUNTER — Other Ambulatory Visit: Payer: Self-pay | Admitting: Nurse Practitioner

## 2018-01-12 ENCOUNTER — Other Ambulatory Visit: Payer: Self-pay | Admitting: *Deleted

## 2018-01-12 MED ORDER — ASPIRIN-DIPYRIDAMOLE ER 25-200 MG PO CP12: 1.0000 | ORAL_CAPSULE | Freq: Two times a day (BID) | ORAL | 0 refills | Status: DC

## 2018-01-12 MED ORDER — ROSUVASTATIN CALCIUM 40 MG PO TABS: 40.0000 mg | ORAL_TABLET | Freq: Every day | ORAL | 0 refills | Status: DC

## 2018-01-12 NOTE — Addendum Note (Signed)
Addended by: Julious Payer D on: 01/12/2018 10:36 AM   Modules accepted: Orders

## 2018-01-13 ENCOUNTER — Ambulatory Visit: Payer: Medicare Other | Admitting: Family Medicine

## 2018-01-17 ENCOUNTER — Encounter: Payer: Self-pay | Admitting: Family Medicine

## 2018-01-25 DIAGNOSIS — R05 Cough: Secondary | ICD-10-CM | POA: Diagnosis not present

## 2018-01-25 DIAGNOSIS — R0602 Shortness of breath: Secondary | ICD-10-CM | POA: Diagnosis not present

## 2018-01-25 DIAGNOSIS — J441 Chronic obstructive pulmonary disease with (acute) exacerbation: Secondary | ICD-10-CM | POA: Diagnosis not present

## 2018-01-25 DIAGNOSIS — R5081 Fever presenting with conditions classified elsewhere: Secondary | ICD-10-CM | POA: Diagnosis not present

## 2018-01-25 DIAGNOSIS — R69 Illness, unspecified: Secondary | ICD-10-CM | POA: Diagnosis not present

## 2018-02-07 ENCOUNTER — Other Ambulatory Visit: Payer: Self-pay | Admitting: *Deleted

## 2018-02-07 MED ORDER — NEBIVOLOL HCL 5 MG PO TABS: 5.0000 mg | ORAL_TABLET | Freq: Every day | ORAL | 0 refills | Status: DC

## 2018-02-17 ENCOUNTER — Encounter: Payer: Self-pay | Admitting: Family Medicine

## 2018-02-17 ENCOUNTER — Ambulatory Visit (HOSPITAL_COMMUNITY)
Admission: RE | Admit: 2018-02-17 | Discharge: 2018-02-17 | Disposition: A | Discharge status: Home / Self Care | Payer: Medicare Other | Source: Ambulatory Visit | Attending: Family Medicine | Admitting: Family Medicine

## 2018-02-17 ENCOUNTER — Ambulatory Visit (INDEPENDENT_AMBULATORY_CARE_PROVIDER_SITE_OTHER): Payer: Medicare Other | Admitting: Family Medicine

## 2018-02-17 VITALS — BP 132/84 | HR 66 | Temp 97.0°F | Ht 67.0 in | Wt 155.0 lb

## 2018-02-17 DIAGNOSIS — E782 Mixed hyperlipidemia: Secondary | ICD-10-CM

## 2018-02-17 DIAGNOSIS — M2548 Effusion, other site: Secondary | ICD-10-CM | POA: Diagnosis not present

## 2018-02-17 DIAGNOSIS — J411 Mucopurulent chronic bronchitis: Secondary | ICD-10-CM

## 2018-02-17 DIAGNOSIS — I6782 Cerebral ischemia: Secondary | ICD-10-CM | POA: Diagnosis not present

## 2018-02-17 DIAGNOSIS — R29818 Other symptoms and signs involving the nervous system: Secondary | ICD-10-CM | POA: Diagnosis not present

## 2018-02-17 DIAGNOSIS — R29810 Facial weakness: Secondary | ICD-10-CM | POA: Diagnosis not present

## 2018-02-17 DIAGNOSIS — R7303 Prediabetes: Secondary | ICD-10-CM | POA: Diagnosis not present

## 2018-02-17 DIAGNOSIS — I10 Essential (primary) hypertension: Secondary | ICD-10-CM | POA: Diagnosis not present

## 2018-02-17 LAB — BAYER DCA HB A1C WAIVED: HB A1C (BAYER DCA - WAIVED): 6.1 % (ref <7.0)

## 2018-02-17 NOTE — Progress Notes (Signed)
BP 132/84   Pulse 66   Temp (!) 97 F (36.1 C) (Oral)   Ht _0  (1.702 m)   Wt 155 lb (70.3 kg)   BMI 24.28 kg/m    Subjective:    [name], male    DOB: [birth year], 72 y.o.   MRN: [id number]  HPI: Luis Salinas is a 72 y.o. male presenting on 02/17/2018 for Hyperlipidemia (follow up); Hypertension; and COPD   HPI Facial droop Patient comes in complaining of facial droop that started 4 weeks ago and he said most of it has gotten better but he still has an eyelid droop and a slight facial droop at the corner of his mouth that is noticeable and he cannot blink easily.  He can forcefully closes eye but he cannot blink easily and his eyes been drying out easily.  He denies any numbness or weakness in either of his arms or legs.  He denies any numbness in his face but mostly has the weakness in the droop.  Patient is a smoker and has high cholesterol and hypertension and has a history of 2 CVAs  Hyperlipidemia Patient is coming in for recheck of his hyperlipidemia. The patient is currently taking Crestor. They deny any issues with myalgias or history of liver damage from it. They deny any focal numbness or weakness or chest pain.   Hypertension Patient is currently on Bystolic, and their blood pressure today is 132/84. Patient denies any lightheadedness or dizziness. Patient denies headaches, blurred vision, chest pains, shortness of breath, or weakness. Denies any side effects from medication and is content with current medication.   COPD Patient is coming in for COPD recheck today.  He is currently on Spiriva Advair.  He has a mild chronic cough but denies any major coughing spells or wheezing spells.  He has 1 nighttime symptoms per week and 1daytime symptoms per week currently.   Prediabetes Patient comes in today for recheck of his diabetes. Patient has been currently taking no medication, last A1c was 6.1. Patient is not currently on an ACE inhibitor/ARB. Patient  has not seen an ophthalmologist this year. Patient denies any issues with their feet.   Relevant past medical, surgical, family and social history reviewed and updated as indicated. Interim medical history since our last visit reviewed. Allergies and medications reviewed and updated.  Review of Systems  Constitutional: Negative for chills and fever.  Eyes: Negative for discharge.  Respiratory: Negative for shortness of breath and wheezing.   Cardiovascular: Negative for chest pain and leg swelling.  Musculoskeletal: Negative for back pain and gait problem.  Skin: Negative for rash.  Neurological: Positive for facial asymmetry and weakness. Negative for dizziness, tremors, light-headedness, numbness and headaches.  All other systems reviewed and are negative.   Per HPI unless specifically indicated above   Allergies as of 02/17/2018   No Known Allergies     Medication List        Accurate as of 02/17/18  2:31 PM. Always use your most recent med list.          dipyridamole-aspirin 200-25 MG 12hr capsule Commonly known as:  AGGRENOX Take 1 capsule by mouth 2 (two) times daily.   Fluticasone-Salmeterol 250-50 MCG/DOSE Aepb Commonly known as:  ADVAIR DISKUS Inhale 1 puff into the lungs every 12 (twelve) hours.   naproxen 500 MG tablet Commonly known as:  NAPROSYN Take 1 tablet (500 mg total) 2 (two) times daily with a meal by  mouth.   nebivolol 5 MG tablet Commonly known as:  BYSTOLIC Take 1 tablet (5 mg total) by mouth daily.   nitroGLYCERIN 0.4 MG SL tablet Commonly known as:  NITROSTAT Place 1 tablet (0.4 mg total) under the tongue every 5 (five) minutes as needed for chest pain.   rosuvastatin 40 MG tablet Commonly known as:  CRESTOR Take 1 tablet (40 mg total) by mouth daily.   tiotropium 18 MCG inhalation capsule Commonly known as:  SPIRIVA HANDIHALER Place 1 capsule (18 mcg total) into inhaler and inhale daily.          Objective:    BP 132/84   Pulse  66   Temp (!) 97 F (36.1 C) (Oral)   Ht _0  (1.702 m)   Wt 155 lb (70.3 kg)   BMI 24.28 kg/m   Wt Readings from Last 3 Encounters:  02/17/18 155 lb (70.3 kg)  08/19/17 150 lb (68 kg)  06/11/17 155 lb (70.3 kg)    Physical Exam  Constitutional: He is oriented to person, place, and time. He appears well-developed and well-nourished. No distress.  Eyes: Conjunctivae and EOM are normal. Pupils are equal, round, and reactive to light. No scleral icterus.  Patient has difficulty closing left eyelid  Cardiovascular: Normal rate, regular rhythm, normal heart sounds and intact distal pulses.  No murmur heard. Pulmonary/Chest: Effort normal and breath sounds normal. No respiratory distress. He has no wheezes. He has no rales.  Musculoskeletal: Normal range of motion. He exhibits no edema.  Neurological: He is alert and oriented to person, place, and time. He has normal strength. No cranial nerve deficit or sensory deficit. Coordination and gait normal.  Patient has a slight droop of the left corner of his mouth  Skin: Skin is warm and dry. No rash noted. He is not diaphoretic.  Psychiatric: He has a normal mood and affect. His behavior is normal.  Nursing note and vitals reviewed.   Results for orders placed or performed in visit on 08/19/17  Bayer DCA Hb A1c Waived  Result Value Ref Range   Bayer DCA Hb A1c Waived 6.1 <7.0 %      Assessment & Plan:   Problem List Items Addressed This Visit      Cardiovascular and Mediastinum   HTN (hypertension) - Primary   Relevant Orders   CMP14+EGFR     Respiratory   COPD (chronic obstructive pulmonary disease) ([place])   Relevant Orders   CBC with Differential/Platelet     Other   Hyperlipidemia   Relevant Orders   Lipid panel   Prediabetes   Relevant Orders   Bayer DCA Hb A1c Waived   CMP14+EGFR    Other Visit Diagnoses    Other symptoms and signs involving the nervous system       Relevant Orders   MR Brain Wo Contrast    ECHOCARDIOGRAM COMPLETE   US Carotid Duplex Bilateral   Facial droop       Relevant Orders   MR Brain Wo Contrast   ECHOCARDIOGRAM COMPLETE   US Carotid Duplex Bilateral       Follow up plan: Return in about 3 months (around 05/20/2018), or if symptoms worsen or fail to improve, for Prediabetes and hypertension check.  Counseling provided for all of the vaccine components Orders Placed This Encounter  Procedures  . MR Brain Wo Contrast  . US Carotid Duplex Bilateral  . Bayer DCA Hb A1c Waived  . CMP14+EGFR  . Lipid panel  .  CBC with Differential/Platelet  . ECHOCARDIOGRAM COMPLETE    Caryl Pina, MD [facility] 02/17/2018, 2:31 PM

## 2018-02-18 LAB — CMP14+EGFR
ALBUMIN: 3.7 g/dL (ref 3.5–4.8)
ALK PHOS: 101 IU/L (ref 39–117)
ALT: 13 IU/L (ref 0–44)
AST: 15 IU/L (ref 0–40)
Albumin/Globulin Ratio: 1.3 (ref 1.2–2.2)
BILIRUBIN TOTAL: 0.3 mg/dL (ref 0.0–1.2)
BUN / CREAT RATIO: 7 — AB (ref 10–24)
BUN: 8 mg/dL (ref 8–27)
CHLORIDE: 99 mmol/L (ref 96–106)
CO2: 26 mmol/L (ref 20–29)
Calcium: 8.9 mg/dL (ref 8.6–10.2)
Creatinine, Ser: 1.13 mg/dL (ref 0.76–1.27)
GFR calc Af Amer: 75 mL/min/{1.73_m2} (ref >59)
GFR calc non Af Amer: 65 mL/min/{1.73_m2} (ref >59)
GLOBULIN, TOTAL: 2.9 g/dL (ref 1.5–4.5)
Glucose: 120 mg/dL — ABNORMAL HIGH (ref 65–99)
POTASSIUM: 4 mmol/L (ref 3.5–5.2)
SODIUM: 137 mmol/L (ref 134–144)
Total Protein: 6.6 g/dL (ref 6.0–8.5)

## 2018-02-18 LAB — CBC WITH DIFFERENTIAL/PLATELET
BASOS: 1 %
Basophils Absolute: 0 10*3/uL (ref 0.0–0.2)
EOS (ABSOLUTE): 0.2 10*3/uL (ref 0.0–0.4)
Eos: 2 %
HEMOGLOBIN: 15.4 g/dL (ref 13.0–17.7)
Hematocrit: 45.8 % (ref 37.5–51.0)
IMMATURE GRANS (ABS): 0 10*3/uL (ref 0.0–0.1)
IMMATURE GRANULOCYTES: 0 %
LYMPHS: 28 %
Lymphocytes Absolute: 2.2 10*3/uL (ref 0.7–3.1)
MCH: 29.8 pg (ref 26.6–33.0)
MCHC: 33.6 g/dL (ref 31.5–35.7)
MCV: 89 fL (ref 79–97)
MONOCYTES: 7 %
Monocytes Absolute: 0.6 10*3/uL (ref 0.1–0.9)
NEUTROS ABS: 4.9 10*3/uL (ref 1.4–7.0)
NEUTROS PCT: 62 %
PLATELETS: 165 10*3/uL (ref 150–379)
RBC: 5.17 x10E6/uL (ref 4.14–5.80)
RDW: 14.1 % (ref 12.3–15.4)
WBC: 7.8 10*3/uL (ref 3.4–10.8)

## 2018-02-18 LAB — LIPID PANEL
Chol/HDL Ratio: 2.1 ratio (ref 0.0–5.0)
Cholesterol, Total: 101 mg/dL (ref 100–199)
HDL: 48 mg/dL (ref >39)
LDL Calculated: 38 mg/dL (ref 0–99)
TRIGLYCERIDES: 76 mg/dL (ref 0–149)
VLDL Cholesterol Cal: 15 mg/dL (ref 5–40)

## 2018-02-21 ENCOUNTER — Ambulatory Visit (HOSPITAL_COMMUNITY)
Admission: RE | Admit: 2018-02-21 | Discharge: 2018-02-21 | Disposition: A | Discharge status: Home / Self Care | Payer: Medicare Other | Source: Ambulatory Visit | Attending: Family Medicine | Admitting: Family Medicine

## 2018-02-21 DIAGNOSIS — I082 Rheumatic disorders of both aortic and tricuspid valves: Secondary | ICD-10-CM | POA: Insufficient documentation

## 2018-02-21 DIAGNOSIS — R29810 Facial weakness: Secondary | ICD-10-CM | POA: Diagnosis not present

## 2018-02-21 DIAGNOSIS — R29818 Other symptoms and signs involving the nervous system: Secondary | ICD-10-CM

## 2018-02-21 DIAGNOSIS — I5030 Unspecified diastolic (congestive) heart failure: Secondary | ICD-10-CM | POA: Diagnosis not present

## 2018-02-21 NOTE — Progress Notes (Signed)
*  PRELIMINARY RESULTS* Echocardiogram ECHO has been performed.  Luis Salinas 02/21/2018, 3:28 PM

## 2018-02-24 ENCOUNTER — Other Ambulatory Visit: Payer: Self-pay | Admitting: *Deleted

## 2018-02-24 DIAGNOSIS — R931 Abnormal findings on diagnostic imaging of heart and coronary circulation: Secondary | ICD-10-CM

## 2018-03-09 ENCOUNTER — Ambulatory Visit (HOSPITAL_COMMUNITY)
Admission: RE | Admit: 2018-03-09 | Discharge: 2018-03-09 | Disposition: A | Discharge status: Home / Self Care | Payer: Medicare Other | Source: Ambulatory Visit | Attending: Family Medicine | Admitting: Family Medicine

## 2018-03-09 DIAGNOSIS — Z87891 Personal history of nicotine dependence: Secondary | ICD-10-CM | POA: Insufficient documentation

## 2018-03-09 DIAGNOSIS — R29810 Facial weakness: Secondary | ICD-10-CM | POA: Diagnosis not present

## 2018-03-09 DIAGNOSIS — I10 Essential (primary) hypertension: Secondary | ICD-10-CM | POA: Insufficient documentation

## 2018-03-09 DIAGNOSIS — I2510 Atherosclerotic heart disease of native coronary artery without angina pectoris: Secondary | ICD-10-CM | POA: Insufficient documentation

## 2018-03-09 DIAGNOSIS — R29818 Other symptoms and signs involving the nervous system: Secondary | ICD-10-CM | POA: Insufficient documentation

## 2018-03-09 DIAGNOSIS — E785 Hyperlipidemia, unspecified: Secondary | ICD-10-CM | POA: Insufficient documentation

## 2018-03-09 DIAGNOSIS — Z8673 Personal history of transient ischemic attack (TIA), and cerebral infarction without residual deficits: Secondary | ICD-10-CM | POA: Insufficient documentation

## 2018-03-09 DIAGNOSIS — I6523 Occlusion and stenosis of bilateral carotid arteries: Secondary | ICD-10-CM | POA: Diagnosis not present

## 2018-03-22 NOTE — Progress Notes (Signed)
Cardiology Office Note   Date:  03/23/2018   ID:  Luis Salinas, Luis Salinas 11-May-1946, MRN [id number]  PCP:  Dettinger, Elige Radon, MD  Cardiologist:   No primary care provider on file. Referring:  Dettinger, Elige Radon, MD  Chief Complaint  Patient presents with  . Coronary Artery Disease      History of Present Illness: Luis Salinas is a 72 y.o. male who is referred by Dettinger, Elige Radon, MD for evaluation of an abnormal echo.  In the past he had an evaluation of a cardiomyopathy with an EF 45 - 50%.  He had a previous stroke.  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  He has a previous history of coronary stent at [facility] in 2008.  When I last saw him in 2014 I did not have a description of his coronary anatomy.  However, for this appointment on Care Everywhere I was able to see that he had a previous occluded circumflex that was stented in 2008 with no obstructive disease elsewhere.   He was recently seen by Dettinger, Elige Radon, MD for evaluation of a facial droop.  He had an echo with a possible PFO/ASD.  Ultimately he was thought to have Bell's palsy.  Again he was found to have intracerebral vascular disease.  He was referred for evaluation.     He actually says that he thinks he feels fairly good.  He still smokes a cigar per day but is not smoking cigarettes like he did for many years.  He denies any cardiovascular symptoms.  He does a lot of housework. The patient denies any new symptoms such as chest discomfort, neck or arm discomfort. There has been no new shortness of breath, PND or orthopnea. There have been no reported palpitations, presyncope or syncope.  Past Medical History:  Diagnosis Date  . Amnesia    He was found and reuintied with his family in 36 after 27 years.  . 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 His family found him after 27 years  . Arthritis   . Concussion    hx of  . COPD  (chronic obstructive pulmonary disease) (HCC)   . Myocardial infarction [facility])    2007 [facility]  . Pneumonia    exposure to caustic chemicals  . Stroke [facility])    multiple strokes    Past Surgical History:  Procedure Laterality Date  . ANTERIOR CERVICAL DECOMP/DISCECTOMY FUSION  05/16/2012   Procedure: ANTERIOR CERVICAL DECOMPRESSION/DISCECTOMY FUSION 2 LEVELS;  Surgeon: Barnett Abu, MD;  Location: [facility];  Service: Neurosurgery;  Laterality: N/A;  Cervical Five-Six, Cervical Six-Seven Anterior Cervical Decompression/Diskectomy Fusion  . CARDIAC CATHETERIZATION    . COLONOSCOPY W/ POLYPECTOMY    . CORONARY ANGIOPLASTY    . JOINT REPLACEMENT     Left knee replacement  . JOINT REPLACEMENT     left elbow at age 87  . ROTATOR CUFF REPAIR     left  . TONSILLECTOMY       Current Outpatient Medications  Medication Sig Dispense Refill  . dipyridamole-aspirin (AGGRENOX) 200-25 MG 12hr capsule Take 1 capsule by mouth 2 (two) times daily. 180 capsule 0  . Fluticasone-Salmeterol (ADVAIR DISKUS) 250-50 MCG/DOSE AEPB Inhale 1 puff into the lungs every 12 (twelve) hours. 180 each 1  . nebivolol (BYSTOLIC) 5 MG tablet Take 1 tablet (5 mg total)  by mouth daily. 90 tablet 0  . nitroGLYCERIN (NITROSTAT) 0.4 MG SL tablet Place 1 tablet (0.4 mg total) under the tongue every 5 (five) minutes as needed for chest pain. 50 tablet 3  . rosuvastatin (CRESTOR) 40 MG tablet Take 1 tablet (40 mg total) by mouth daily. 90 tablet 0  . tiotropium (SPIRIVA HANDIHALER) 18 MCG inhalation capsule Place 1 capsule (18 mcg total) into inhaler and inhale daily. 90 capsule 1   No current facility-administered medications for this visit.     Allergies:   Patient has no known allergies.    Social History:  The patient  reports that he has been smoking cigars.  He has never used smokeless tobacco. He reports that he does not drink alcohol or use drugs.   Family History:  The patient's family history includes Aneurysm in  his father; COPD in his father; Heart disease in his mother.    ROS:  Please see the history of present illness.   Otherwise, review of systems are positive for none.   All other systems are reviewed and negative.    PHYSICAL EXAM: VS:  BP 122/70   Pulse 63   Ht 5\' 9"  (1.753 m)   Wt 156 lb (70.8 kg)   BMI 23.04 kg/m  , BMI Body mass index is 23.04 kg/m. GENERAL:  Well appearing HEENT:  Pupils equal round and reactive, fundi not visualized, oral mucosa unremarkable NECK:  No jugular venous distention, waveform within normal limits, carotid upstroke brisk and symmetric, no bruits, no thyromegaly LYMPHATICS:  No cervical, inguinal adenopathy LUNGS:  Clear to auscultation bilaterally BACK:  No CVA tenderness CHEST:  Unremarkable HEART:  PMI not displaced or sustained,S1 and S2 within normal limits, no S3, no S4, no clicks, no rubs, no murmurs ABD:  Flat, positive bowel sounds normal in frequency in pitch, no bruits, no rebound, no guarding, no midline pulsatile mass, no hepatomegaly, no splenomegaly EXT:  2 plus pulses throughout, no edema, no cyanosis no clubbing, right fem bruit  SKIN:  No rashes no nodules NEURO:  Cranial nerves II through XII grossly intact, motor grossly intact throughout PSYCH:  Cognitively intact, oriented to person place and time    EKG:  EKG is ordered today. The ekg ordered today demonstrates sinus rhythm, rate 63, left axis deviation, left anterior fascicular block, premature atrial contractions, nonspecific diffuse T wave flattening.   Recent Labs: 02/17/2018: ALT 13; BUN 8; Creatinine, Ser 1.13; Hemoglobin 15.4; Platelets 165; Potassium 4.0; Sodium 137    Lipid Panel    Component Value Date/Time   CHOL 101 02/17/2018 1446   CHOL 114 05/01/2013 1302   TRIG 76 02/17/2018 1446   TRIG 88 03/29/2015 1106   TRIG 141 05/01/2013 1302   HDL 48 02/17/2018 1446   HDL 38 (L) 03/29/2015 1106   HDL 34 (L) 05/01/2013 1302   CHOLHDL 2.1 02/17/2018 1446    CHOLHDL 4.0 03/17/2013 0415   VLDL 12 03/17/2013 0415   LDLCALC 38 02/17/2018 1446   LDLCALC 52 05/01/2013 1302      Wt Readings from Last 3 Encounters:  03/23/18 156 lb (70.8 kg)  02/17/18 155 lb (70.3 kg)  08/19/17 150 lb (68 kg)      Other studies Reviewed: Additional studies/ records that were reviewed today include: [facility] records, recent echo and MRI. Review of the above records demonstrates:  Please see elsewhere in the note.     ASSESSMENT AND PLAN:  ABNORMAL ECHO:    The patient  had a questionable PFO that was not suggested from previous echoes.  Is not had any evidence of an embolic CVA.  Given this I do not think there be any change in therapy for he found to clearly have a PFO at this point so I do not think further imaging is necessary.  Of note his ejection fraction is improved compared to previous.  DYSLIPIDEMIA: His LDL was excellent as above.  No change in therapy.  HTN: His blood pressure is at target.  He will continue with meds as listed.  HYPERGLYCEMIA: A1c was 6.1.  He will continue with meds as listed.  SMOKER/MALE/AGE GREATER THAN 65:  He will have a screening ultrasound for AAA   Current medicines are reviewed at length with the patient today.  The patient does not have concerns regarding medicines.  The following changes have been made:  no change  Labs/ tests ordered today include:   Orders Placed This Encounter  Procedures  . EKG 12-Lead     Disposition:   FU with me as needed.     Signed, Rollene Rotunda, MD  03/23/2018 2:47 PM    [facility]

## 2018-03-23 ENCOUNTER — Encounter: Payer: Self-pay | Admitting: Cardiology

## 2018-03-23 ENCOUNTER — Ambulatory Visit (INDEPENDENT_AMBULATORY_CARE_PROVIDER_SITE_OTHER): Payer: Medicare Other | Admitting: Cardiology

## 2018-03-23 VITALS — BP 122/70 | HR 63 | Ht 69.0 in | Wt 156.0 lb

## 2018-03-23 DIAGNOSIS — F17200 Nicotine dependence, unspecified, uncomplicated: Secondary | ICD-10-CM | POA: Diagnosis not present

## 2018-03-23 DIAGNOSIS — R931 Abnormal findings on diagnostic imaging of heart and coronary circulation: Secondary | ICD-10-CM | POA: Diagnosis not present

## 2018-03-23 DIAGNOSIS — I2510 Atherosclerotic heart disease of native coronary artery without angina pectoris: Secondary | ICD-10-CM

## 2018-03-23 DIAGNOSIS — I10 Essential (primary) hypertension: Secondary | ICD-10-CM | POA: Diagnosis not present

## 2018-03-23 NOTE — Patient Instructions (Signed)
Medication Instructions:  The current medical regimen is effective;  continue present plan and medications.  Testing/Procedures: Your physician has requested that you have an abdominal aorta duplex. During this test, an ultrasound is used to evaluate the aorta. Allow 30 minutes for this exam. Do not eat after midnight the day before and avoid carbonated beverages.  Follow-Up: Follow up as needed with Dr Antoine Poche.  If you need a refill on your cardiac medications before your next appointment, please call your pharmacy.  Thank you for choosing [facility]!!

## 2018-03-30 ENCOUNTER — Ambulatory Visit (INDEPENDENT_AMBULATORY_CARE_PROVIDER_SITE_OTHER): Payer: Medicare Other | Admitting: *Deleted

## 2018-03-30 ENCOUNTER — Encounter: Payer: Self-pay | Admitting: *Deleted

## 2018-03-30 VITALS — BP 140/74 | HR 55 | Ht 67.0 in | Wt 156.0 lb

## 2018-03-30 DIAGNOSIS — Z0000 Encounter for general adult medical examination without abnormal findings: Secondary | ICD-10-CM

## 2018-03-30 NOTE — Patient Instructions (Signed)
  Luis Salinas , Thank you for taking time to come for your Medicare Wellness Visit. I appreciate your ongoing commitment to your health goals. Please review the following plan we discussed and let me know if I can assist you in the future.   These are the goals we discussed: Goals    . Exercise 3x per week (30 min per time)     Walk for 30 minutes 3 times a week at a reasonable pace       This is a list of the screening recommended for you and due dates:  Health Maintenance  Topic Date Due  .  Hepatitis C: One time screening is recommended by Center for Disease Control  (CDC) for  adults born from [birth year] through 1966.   1946/10/14  . Colon Cancer Screening  12/28/2019  . Tetanus Vaccine  12/28/2023  . Pneumonia vaccines  Completed  . Flu Shot  Discontinued

## 2018-03-30 NOTE — Progress Notes (Signed)
Subjective:   Luis Salinas is a 72 y.o. male who presents for an Initial Medicare Annual Wellness Visit. Luis Salinas is married and lives at home with his wife of 25 years. This is his 4th marriage. He had a head injury as a young adult with subsequent amnesia. His sister located him after many years. He states that he has had a great life and has had many adventures. He's travelled and lived across the [place]. He has raised 7 children and has many grandchildren. He has two twin granddaughters that he credits with saving his life. 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   Review of Systems  Health is better than last year.   Reports that his overall health is much better than it was a few years ago. He felt like he was dying but his granddaughter's got him up and moving around again he feels that they are the reason he is still here.   Cardiac Risk Factors include: advanced age (>60men, >72 women);hypertension;dyslipidemia;male gender;sedentary lifestyle;smoking/ tobacco exposure     Objective:    Today's Vitals   03/30/18 0845  BP: 140/74  Pulse: (!) 55  Weight: 156 lb (70.8 kg)  Height: 5\' 7"  (1.702 m)   Body mass index is 24.43 kg/m.  Advanced Directives 03/30/2018 10/20/2016 05/17/2015 04/02/2013  Does Patient Have a Medical Advance Directive? No No Yes Patient does not have advance directive;Patient would like information  Type of Advance Directive - - Living will -  Does patient want to make changes to medical advance directive? - - No - Patient declined -  Copy of Healthcare Power of Attorney in Chart? - - No - copy requested -  Would patient like information on creating a medical advance directive? No - Patient declined Yes - Educational materials given No - patient declined information Referral made to social work  Pre-existing out of facility DNR order (yellow form or pink MOST form) - - - No    Current Medications  (verified) Outpatient Encounter Medications as of 03/30/2018  Medication Sig  . dipyridamole-aspirin (AGGRENOX) 200-25 MG 12hr capsule Take 1 capsule by mouth 2 (two) times daily.  . Fluticasone-Salmeterol (ADVAIR DISKUS) 250-50 MCG/DOSE AEPB Inhale 1 puff into the lungs every 12 (twelve) hours.  . nebivolol (BYSTOLIC) 5 MG tablet Take 1 tablet (5 mg total) by mouth daily.  . nitroGLYCERIN (NITROSTAT) 0.4 MG SL tablet Place 1 tablet (0.4 mg total) under the tongue every 5 (five) minutes as needed for chest pain.  . rosuvastatin (CRESTOR) 40 MG tablet Take 1 tablet (40 mg total) by mouth daily.  Marland Kitchen tiotropium (SPIRIVA HANDIHALER) 18 MCG inhalation capsule Place 1 capsule (18 mcg total) into inhaler and inhale daily.   No facility-administered encounter medications on file as of 03/30/2018.     Allergies (verified) Patient has no known allergies.   History: Past Medical History:  Diagnosis Date  . Amnesia    He was found and reuintied with his family in 47 after 27 years.  . 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 His family found him after 27 years  . Arthritis   . Concussion    hx of  . COPD (chronic obstructive pulmonary disease) (HCC)   . Myocardial infarction [facility])    2007 [facility]  . Pneumonia    exposure to caustic chemicals  . Stroke [facility])  multiple strokes   Past Surgical History:  Procedure Laterality Date  . ANTERIOR CERVICAL DECOMP/DISCECTOMY FUSION  05/16/2012   Procedure: ANTERIOR CERVICAL DECOMPRESSION/DISCECTOMY FUSION 2 LEVELS;  Surgeon: Barnett Abu, MD;  Location: [facility];  Service: Neurosurgery;  Laterality: N/A;  Cervical Five-Six, Cervical Six-Seven Anterior Cervical Decompression/Diskectomy Fusion  . CARDIAC CATHETERIZATION    . COLONOSCOPY W/ POLYPECTOMY    . CORONARY ANGIOPLASTY    . JOINT REPLACEMENT     Left knee replacement  . JOINT REPLACEMENT     left elbow at age 87  . ROTATOR CUFF REPAIR     left  . TONSILLECTOMY      Family History  Problem Relation Age of Onset  . Heart disease Mother   . COPD Father   . Aneurysm Father   . Anesthesia problems Neg Hx   . Hypotension Neg Hx   . Malignant hyperthermia Neg Hx   . Pseudochol deficiency Neg Hx    Social History   Socioeconomic History  . Marital status: Married    Spouse name: Not on file  . Number of children: 1  . Years of education: college  . Highest education level: Some college, no degree  Occupational History  . Occupation: Retired    Comment: Geophysical data processor  . Financial resource strain: Not hard at all  . Food insecurity:    Worry: Never true    Inability: Never true  . Transportation needs:    Medical: No    Non-medical: No  Tobacco Use  . Smoking status: Light Tobacco Smoker    Types: Cigars    Last attempt to quit: 04/03/2013    Years since quitting: 4.9  . Smokeless tobacco: Never Used  . Tobacco comment: One cigar per day.  Smoked 40 years cigarettes.  10 years ago quit cigarettes.  Substance and Sexual Activity  . Alcohol use: No    Alcohol/week: 0.0 oz  . Drug use: No    Types: Marijuana  . Sexual activity: Yes  Lifestyle  . Physical activity:    Days per week: 6 days    Minutes per session: 10 min  . Stress: Not at all  Relationships  . Social connections:    Talks on phone: More than three times a week    Gets together: More than three times a week    Attends religious service: More than 4 times per year    Active member of club or organization: Yes    Attends meetings of clubs or organizations: More than 4 times per year    Relationship status: Married  Other Topics Concern  . Not on file  Social History Narrative   Lives with his fourth wife.      Tobacco Counseling Ready to quit: Not Answered Counseling given: Not Answered Comment: One cigar per day.  Smoked 40 years cigarettes.  10 years ago quit cigarettes.   Clinical Intake:     Pain : 0-10 Pain Location: Generalized(Multiple  joint pains) Pain Descriptors / Indicators: Aching Pain Onset: More than a month ago(pain for years) Pain Frequency: Constant Effect of Pain on Daily Activities: minimal-has gotten accustomed to working with the pain     Nutritional Status: BMI of 19-24  Normal Diabetes: No  What is the last grade level you completed in school?: some college  Interpreter Needed?: No  Information entered by :: Demetrios Loll, RN  Activities of Daily Living In your present state of health, do you have any  difficulty performing the following activities: 03/30/2018  Hearing? N  Vision? N  Comment Last eye exam around 1 year ago  Difficulty concentrating or making decisions? N  Comment has never been very good at remembering names  Walking or climbing stairs? N  Dressing or bathing? N  Doing errands, shopping? N  Preparing Food and eating ? N  Using the Toilet? N  In the past six months, have you accidently leaked urine? N  Do you have problems with loss of bowel control? N  Managing your Medications? N  Comment wife organizes them in a pillbox for him  Managing your Finances? N  Housekeeping or managing your Housekeeping? N  Some recent data might be hidden     Immunizations and Health Maintenance Immunization History  Administered Date(s) Administered  . Pneumococcal Conjugate-13 03/29/2015  . Pneumococcal Polysaccharide-23 10/14/2002, 12/27/2013  . Td 12/14/2008   Health Maintenance Due  Topic Date Due  . Hepatitis C Screening  [birth year]    Patient Care Team: Dettinger, Elige Radon, MD as PCP - General (Family Medicine) Micki Riley, MD as Consulting Physician (Neurology) Iva Boop, MD as Consulting Physician (Gastroenterology)  No hospitalizations, ER visits, or surgeries this past year.      Assessment:   This is a routine wellness examination for Luis Salinas.  Hearing/Vision screen No deficits noted during visit.   Dietary issues and exercise activities  discussed: Current Exercise Habits: The patient does not participate in regular exercise at present, Exercise limited by: orthopedic condition(s) Cooks and cleans and walks to the mailbox and back everyday down long driveway. That usually takes about 5 minutes. He does have increased pain with walking.   Goals    . Exercise 3x per week (30 min per time)     Walk for 30 minutes 3 times a week at a reasonable pace      Depression Screen PHQ 2/9 Scores 02/17/2018 08/19/2017 06/11/2017 02/16/2017  PHQ - 2 Score 0 0 0 0    Fall Risk Fall Risk  02/17/2018 08/19/2017 06/11/2017 10/20/2016 08/13/2016  Falls in the past year? No No No No No    Cognitive Function: MMSE - Mini Mental State Exam 03/30/2018 10/20/2016 05/17/2015  Orientation to time 4 5 5   Orientation to Place 5 5 5   Registration 3 3 3   Attention/ Calculation 5 3 5   Recall 2 3 3   Language- name 2 objects 2 2 2   Language- repeat 1 1 1   Language- follow 3 step command 3 3 3   Language- read & follow direction 1 1 1   Write a sentence 1 1 1   Copy design 1 1 1   Total score 28 28 30   Stable exam results      Screening Tests Health Maintenance  Topic Date Due  . Hepatitis C Screening  1946-05-30  . COLONOSCOPY  12/28/2019  . TETANUS/TDAP  12/28/2023  . PNA vac Low Risk Adult  Completed  . INFLUENZA VACCINE  Discontinued  Reports that last colonoscopy was with Dr Leone Payor in 2011 but I had difficulty finding the report  Plan:  Keep f/u with PCP Increase activity level as tolerated. Aim for at least 150 minutes of moderate activity a week.  Recommend yearly eye exams  I have personally reviewed and noted the following in the patient's chart:   . Medical and social history . Use of alcohol, tobacco or illicit drugs  . Current medications and supplements . Functional ability and status . Nutritional status . Physical  activity . Advanced directives . List of other physicians . Hospitalizations, surgeries, and ER visits in previous  12 months . Vitals . Screenings to include cognitive, depression, and falls . Referrals and appointments  In addition, I have reviewed and discussed with patient certain preventive protocols, quality metrics, and best practice recommendations. A written personalized care plan for preventive services as well as general preventive health recommendations were provided to patient.     Demetrios Loll, RN   03/31/2018

## 2018-04-07 ENCOUNTER — Ambulatory Visit (HOSPITAL_COMMUNITY)
Admission: RE | Admit: 2018-04-07 | Discharge: 2018-04-07 | Disposition: A | Discharge status: Home / Self Care | Payer: Medicare Other | Source: Ambulatory Visit | Attending: Cardiology | Admitting: Cardiology

## 2018-04-07 DIAGNOSIS — F17290 Nicotine dependence, other tobacco product, uncomplicated: Secondary | ICD-10-CM | POA: Diagnosis not present

## 2018-04-07 DIAGNOSIS — I708 Atherosclerosis of other arteries: Secondary | ICD-10-CM | POA: Diagnosis not present

## 2018-04-07 DIAGNOSIS — I2510 Atherosclerotic heart disease of native coronary artery without angina pectoris: Secondary | ICD-10-CM | POA: Diagnosis not present

## 2018-04-07 DIAGNOSIS — F17200 Nicotine dependence, unspecified, uncomplicated: Secondary | ICD-10-CM | POA: Diagnosis not present

## 2018-04-07 DIAGNOSIS — Z136 Encounter for screening for cardiovascular disorders: Secondary | ICD-10-CM | POA: Insufficient documentation

## 2018-04-07 DIAGNOSIS — E785 Hyperlipidemia, unspecified: Secondary | ICD-10-CM | POA: Diagnosis not present

## 2018-04-07 DIAGNOSIS — I10 Essential (primary) hypertension: Secondary | ICD-10-CM | POA: Insufficient documentation

## 2018-04-09 ENCOUNTER — Ambulatory Visit (INDEPENDENT_AMBULATORY_CARE_PROVIDER_SITE_OTHER): Payer: Medicare Other | Admitting: Family Medicine

## 2018-04-09 VITALS — BP 130/69 | HR 72 | Temp 97.1°F | Ht 67.0 in | Wt 151.8 lb

## 2018-04-09 DIAGNOSIS — J441 Chronic obstructive pulmonary disease with (acute) exacerbation: Secondary | ICD-10-CM | POA: Diagnosis not present

## 2018-04-09 MED ORDER — METHYLPREDNISOLONE ACETATE 80 MG/ML IJ SUSP
80.0000 mg | Freq: Once | INTRAMUSCULAR | Status: AC
Start: 2018-04-09 — End: 2018-04-09
  Administered 2018-04-09: 80 mg via INTRAMUSCULAR

## 2018-04-09 MED ORDER — IPRATROPIUM-ALBUTEROL 0.5-2.5 (3) MG/3ML IN SOLN
3.0000 mL | Freq: Once | RESPIRATORY_TRACT | Status: AC
  Administered 2018-04-09: 3 mL via RESPIRATORY_TRACT

## 2018-04-09 MED ORDER — DOXYCYCLINE HYCLATE 100 MG PO TABS: 100.0000 mg | ORAL_TABLET | Freq: Two times a day (BID) | ORAL | 0 refills | Status: AC

## 2018-04-09 MED ORDER — PREDNISONE 20 MG PO TABS: 40.0000 mg | ORAL_TABLET | Freq: Every day | ORAL | 0 refills | Status: AC

## 2018-04-09 MED ORDER — ALBUTEROL SULFATE 108 (90 BASE) MCG/ACT IN AEPB: 2.0000 | INHALATION_SPRAY | Freq: Four times a day (QID) | RESPIRATORY_TRACT | 0 refills | Status: DC | PRN

## 2018-04-09 NOTE — Patient Instructions (Signed)
I have placed on 3 medications today for your COPD flare:  Doxycycline 100 mg p.o. twice daily for the next 7 days.  Take this with food and plenty of water.  Caution sunlight exposure, you may be at increased risk for sunburn.  Prednisone 40 mg p.o. every morning with breakfast for the next 5 days.  Start this tomorrow since you got the injection today.  Albuterol 2 puffs every 6 hours for the next 2 days then as needed as directed.  You received a dose of steroid here via injection as well as an inhalation treatment.   Chronic Obstructive Pulmonary Disease Exacerbation Chronic obstructive pulmonary disease (COPD) is a common lung problem. In COPD, the flow of air from the lungs is limited. COPD exacerbations are times that breathing gets worse and you need extra treatment. Without treatment they can be life threatening. If they happen often, your lungs can become more damaged. If your COPD gets worse, your doctor may treat you with:  Medicines.  Oxygen.  Different ways to clear your airway, such as using a mask.  Follow these instructions at home:  Do not smoke.  Avoid tobacco smoke and other things that bother your lungs.  If given, take your antibiotic medicine as told. Finish the medicine even if you start to feel better.  Only take medicines as told by your doctor.  Drink enough fluids to keep your pee (urine) clear or pale yellow (unless your doctor has told you not to).  Use a cool mist machine (vaporizer).  If you use oxygen or a machine that turns liquid medicine into a mist (nebulizer), continue to use them as told.  Keep up with shots (vaccinations) as told by your doctor.  Exercise regularly.  Eat healthy foods.  Keep all doctor visits as told. Get help right away if:  You are very short of breath and it gets worse.  You have trouble talking.  You have bad chest pain.  You have blood in your spit (sputum).  You have a fever.  You keep throwing up  (vomiting).  You feel weak, or you pass out (faint).  You feel confused.  You keep getting worse. This information is not intended to replace advice given to you by your health care provider. Make sure you discuss any questions you have with your health care provider. Document Released: 11/19/2011 Document Revised: 05/07/2016 Document Reviewed: 08/04/2013 Elsevier Interactive Patient Education  2017 [employer].

## 2018-04-09 NOTE — Progress Notes (Signed)
Subjective: CC: Cough, SOB PCP: Dettinger, Elige Radon, MD ESL:PNPYYFR Luis Salinas is a 72 y.o. male presenting to clinic today for:  1. Cough Patient reports a one-week history of cough and associated shortness of breath.  He notes that the cough is productive with occasional brown particles within the sputum.  He denies hemoptysis.  No fevers, chills, nausea, vomiting, diarrhea, chest pain.  He has been using Claritin and what he thinks is an albuterol inhaler with little improvement in symptoms.  His wife notes associated wheezing.  Patient thinks that symptoms started after allergies started getting worse while moving and cleaning up a house.  Past medical history is significant for COPD.  Patient reports compliance with Advair but notes that he ran out of his Spiriva quite some time ago.   ROS: Per HPI  No Known Allergies Past Medical History:  Diagnosis Date  . Amnesia    He was found and reuintied with his family in 84 after 27 years.  . 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 His family found him after 27 years  . Arthritis   . Concussion    hx of  . COPD (chronic obstructive pulmonary disease) (HCC)   . Myocardial infarction [facility])    2007 [facility]  . Pneumonia    exposure to caustic chemicals  . Stroke [facility])    multiple strokes    Current Outpatient Medications:  .  dipyridamole-aspirin (AGGRENOX) 200-25 MG 12hr capsule, Take 1 capsule by mouth 2 (two) times daily., Disp: 180 capsule, Rfl: 0 .  Fluticasone-Salmeterol (ADVAIR DISKUS) 250-50 MCG/DOSE AEPB, Inhale 1 puff into the lungs every 12 (twelve) hours., Disp: 180 each, Rfl: 1 .  loratadine (CLARITIN) 10 MG tablet, Take 10 mg by mouth daily., Disp: , Rfl:  .  nebivolol (BYSTOLIC) 5 MG tablet, Take 1 tablet (5 mg total) by mouth daily., Disp: 90 tablet, Rfl: 0 .  nitroGLYCERIN (NITROSTAT) 0.4 MG SL tablet, Place 1 tablet (0.4 mg total) under the tongue every 5 (five) minutes as needed for chest pain.,  Disp: 50 tablet, Rfl: 3 .  rosuvastatin (CRESTOR) 40 MG tablet, Take 1 tablet (40 mg total) by mouth daily., Disp: 90 tablet, Rfl: 0 .  tiotropium (SPIRIVA HANDIHALER) 18 MCG inhalation capsule, Place 1 capsule (18 mcg total) into inhaler and inhale daily., Disp: 90 capsule, Rfl: 1 .  Albuterol Sulfate 108 (90 Base) MCG/ACT AEPB, Inhale 2 puffs into the lungs every 6 (six) hours as needed (shortness of breath/ wheeze)., Disp: 1 each, Rfl: 0 .  doxycycline (VIBRA-TABS) 100 MG tablet, Take 1 tablet (100 mg total) by mouth 2 (two) times daily for 7 days., Disp: 14 tablet, Rfl: 0 .  [START ON 04/10/2018] predniSONE (DELTASONE) 20 MG tablet, Take 2 tablets (40 mg total) by mouth daily with breakfast for 5 days., Disp: 10 tablet, Rfl: 0  Current Facility-Administered Medications:  .  methylPREDNISolone acetate (DEPO-MEDROL) injection 80 mg, 80 mg, Intramuscular, Once, Raliegh Ip, DO Social History   Socioeconomic History  . Marital status: Married    Spouse name: Not on file  . Number of children: 1  . Years of education: college  . Highest education level: Some college, no degree  Occupational History  . Occupation: Retired    Comment: Geophysical data processor  . Financial resource strain: Not hard at all  . Food insecurity:    Worry: Never true    Inability: Never true  .  Transportation needs:    Medical: No    Non-medical: No  Tobacco Use  . Smoking status: Light Tobacco Smoker    Types: Cigars    Last attempt to quit: 04/03/2013    Years since quitting: 5.0  . Smokeless tobacco: Never Used  . Tobacco comment: One cigar per day.  Smoked 40 years cigarettes.  10 years ago quit cigarettes.  Substance and Sexual Activity  . Alcohol use: No    Alcohol/week: 0.0 oz  . Drug use: No    Types: Marijuana  . Sexual activity: Yes  Lifestyle  . Physical activity:    Days per week: 6 days    Minutes per session: 10 min  . Stress: Not at all  Relationships  . Social connections:     Talks on phone: More than three times a week    Gets together: More than three times a week    Attends religious service: More than 4 times per year    Active member of club or organization: Yes    Attends meetings of clubs or organizations: More than 4 times per year    Relationship status: Married  . Intimate partner violence:    Fear of current or ex partner: No    Emotionally abused: No    Physically abused: No    Forced sexual activity: No  Other Topics Concern  . Not on file  Social History Narrative   Lives with his fourth wife.      Family History  Problem Relation Age of Onset  . Heart disease Mother   . COPD Father   . Aneurysm Father   . Anesthesia problems Neg Hx   . Hypotension Neg Hx   . Malignant hyperthermia Neg Hx   . Pseudochol deficiency Neg Hx     Objective: Office vital signs reviewed. BP 130/69   Pulse 72   Temp (!) 97.1 F (36.2 C) (Oral)   Ht 5\' 7"  (1.702 m)   Wt 151 lb 12.8 oz (68.9 kg)   SpO2 93%   BMI 23.78 kg/m   Physical Examination:  General: Awake, alert, well nourished, nontoxic appearing, No acute distress HEENT: Normal    Eyes: PERRLA, extraocular membranes intact, sclera white    Throat: moist mucus membranes Cardio: regular rate and rhythm, S1S2 heard, no murmurs appreciated Pulm: Reduced air movement throughout.  No appreciable wheezes, rhonchi's or rales.  Patient has normal oxygen saturation on room air.  Assessment/ Plan: 72 y.o. male   1. COPD with acute exacerbation [facility]) Patient with normal vital signs.  He is nontoxic-appearing.  He has technically normal oxygen saturation on room air.  His pulmonary exam was notable for reduced air movement throughout.  Therefore, he was given a dose of Depo-Medrol here in office as well as a DuoNeb treatment.  After the DuoNeb treatment his pulmonary exam did improve with improved air movement and now audible expiratory wheezes.  We will treat as COPD exacerbation with prednisone burst,  doxycycline and scheduled albuterol for the next few days.  Home care instructions were reviewed with the patient.  Reasons for return and for emergent evaluation the emergency department discussed.  He was good understanding will follow-up as needed. - methylPREDNISolone acetate (DEPO-MEDROL) injection 80 mg - ipratropium-albuterol (DUONEB) 0.5-2.5 (3) MG/3ML nebulizer solution 3 mL    Meds ordered this encounter  Medications  . methylPREDNISolone acetate (DEPO-MEDROL) injection 80 mg  . doxycycline (VIBRA-TABS) 100 MG tablet    Sig: Take 1  tablet (100 mg total) by mouth 2 (two) times daily for 7 days.    Dispense:  14 tablet    Refill:  0  . predniSONE (DELTASONE) 20 MG tablet    Sig: Take 2 tablets (40 mg total) by mouth daily with breakfast for 5 days.    Dispense:  10 tablet    Refill:  0  . Albuterol Sulfate 108 (90 Base) MCG/ACT AEPB    Sig: Inhale 2 puffs into the lungs every 6 (six) hours as needed (shortness of breath/ wheeze).    Dispense:  1 each    Refill:  0     Mandell Pangborn Hulen Skains, DO [facility] [phone]

## 2018-04-10 ENCOUNTER — Other Ambulatory Visit: Payer: Self-pay | Admitting: Family Medicine

## 2018-05-03 ENCOUNTER — Other Ambulatory Visit: Payer: Self-pay | Admitting: Family Medicine

## 2018-05-11 ENCOUNTER — Ambulatory Visit: Payer: Medicare Other | Admitting: Cardiology

## 2018-06-13 ENCOUNTER — Other Ambulatory Visit: Payer: Self-pay | Admitting: Family Medicine

## 2018-06-13 MED ORDER — NITROGLYCERIN 0.4 MG SL SUBL: 0.4000 mg | SUBLINGUAL_TABLET | SUBLINGUAL | 3 refills | Status: DC | PRN

## 2018-06-13 NOTE — Telephone Encounter (Signed)
Nitroglycerin sent to pharmacy and patient aware

## 2018-06-21 DIAGNOSIS — H524 Presbyopia: Secondary | ICD-10-CM | POA: Diagnosis not present

## 2018-07-28 ENCOUNTER — Other Ambulatory Visit: Payer: Self-pay | Admitting: Family Medicine

## 2018-08-01 DIAGNOSIS — H25013 Cortical age-related cataract, bilateral: Secondary | ICD-10-CM | POA: Diagnosis not present

## 2018-08-01 DIAGNOSIS — H2512 Age-related nuclear cataract, left eye: Secondary | ICD-10-CM | POA: Diagnosis not present

## 2018-08-01 DIAGNOSIS — H35363 Drusen (degenerative) of macula, bilateral: Secondary | ICD-10-CM | POA: Diagnosis not present

## 2018-08-01 DIAGNOSIS — H35033 Hypertensive retinopathy, bilateral: Secondary | ICD-10-CM | POA: Diagnosis not present

## 2018-08-01 DIAGNOSIS — H2513 Age-related nuclear cataract, bilateral: Secondary | ICD-10-CM | POA: Diagnosis not present

## 2018-08-01 DIAGNOSIS — H353122 Nonexudative age-related macular degeneration, left eye, intermediate dry stage: Secondary | ICD-10-CM | POA: Diagnosis not present

## 2018-10-28 ENCOUNTER — Other Ambulatory Visit: Payer: Self-pay | Admitting: Family Medicine

## 2018-11-16 ENCOUNTER — Ambulatory Visit (INDEPENDENT_AMBULATORY_CARE_PROVIDER_SITE_OTHER): Payer: Medicare Other | Admitting: Family Medicine

## 2018-11-16 ENCOUNTER — Encounter: Payer: Self-pay | Admitting: Family Medicine

## 2018-11-16 VITALS — BP 145/75 | HR 68 | Temp 97.7°F | Ht 67.0 in | Wt 149.4 lb

## 2018-11-16 DIAGNOSIS — E782 Mixed hyperlipidemia: Secondary | ICD-10-CM | POA: Diagnosis not present

## 2018-11-16 DIAGNOSIS — J411 Mucopurulent chronic bronchitis: Secondary | ICD-10-CM | POA: Diagnosis not present

## 2018-11-16 DIAGNOSIS — R7303 Prediabetes: Secondary | ICD-10-CM | POA: Diagnosis not present

## 2018-11-16 DIAGNOSIS — Z720 Tobacco use: Secondary | ICD-10-CM | POA: Diagnosis not present

## 2018-11-16 DIAGNOSIS — I10 Essential (primary) hypertension: Secondary | ICD-10-CM

## 2018-11-16 DIAGNOSIS — J441 Chronic obstructive pulmonary disease with (acute) exacerbation: Secondary | ICD-10-CM

## 2018-11-16 LAB — BAYER DCA HB A1C WAIVED: HB A1C: 6.2 % (ref <7.0)

## 2018-11-16 MED ORDER — NEBIVOLOL HCL 5 MG PO TABS: 5.0000 mg | ORAL_TABLET | Freq: Every day | ORAL | 3 refills | Status: DC

## 2018-11-16 MED ORDER — ROSUVASTATIN CALCIUM 40 MG PO TABS: 40.0000 mg | ORAL_TABLET | Freq: Every day | ORAL | 3 refills | Status: DC

## 2018-11-16 MED ORDER — AZITHROMYCIN 250 MG PO TABS: ORAL_TABLET | ORAL | 0 refills | Status: DC

## 2018-11-16 MED ORDER — ASPIRIN-DIPYRIDAMOLE ER 25-200 MG PO CP12: 1.0000 | ORAL_CAPSULE | Freq: Two times a day (BID) | ORAL | 3 refills | Status: DC

## 2018-11-16 MED ORDER — PREDNISONE 20 MG PO TABS: ORAL_TABLET | ORAL | 0 refills | Status: DC

## 2018-11-16 NOTE — Progress Notes (Signed)
BP (!) 145/75   Pulse 68   Temp 97.7 F (36.5 C) (Oral)   Ht 5' 7"  (1.702 m)   Wt 149 lb 6.4 oz (67.8 kg)   SpO2 97%   BMI 23.40 kg/m    Subjective:    Patient ID: DESMAN Salinas, male    DOB: [birth year], 72 y.o.   MRN: [id number]  HPI: Luis Salinas is a 72 y.o. male presenting on 11/16/2018 for COPD (6 month follow up); Hypertension; Cough (x 2 days); Nasal Congestion; and Wheezing   HPI Prediabetes Patient comes in today for recheck of his diabetes. Patient has been currently taking no medication we are just monitoring for now. Patient is not currently on an ACE inhibitor/ARB. Patient has not seen an ophthalmologist this year. Patient denies any issues with their feet.   Hypertension Patient is currently on Bystolic, and their blood pressure today is 145/75. Patient denies any lightheadedness or dizziness. Patient denies headaches, blurred vision, chest pains, shortness of breath, or weakness. Denies any side effects from medication and is content with current medication.   Hyperlipidemia Patient is coming in for recheck of his hyperlipidemia. The patient is currently taking Crestor. They deny any issues with myalgias or history of liver damage from it. They deny any focal numbness or weakness or chest pain.   COPD Patient is coming in for COPD recheck today.  He is currently on Advair but is only using as needed and says he has not had the Spiriva in some time.  He has a mild chronic cough but denies any major coughing spells or wheezing spells.  He has 3nighttime symptoms per week and 5daytime symptoms per week currently.  Over the past 2 days he especially been having a lot more wheezing and cough and congestion and just not doing as well and he feels like his COPD is started acting up, he associates that it started with the cold wet weather that we had a couple days ago.  He denies any shortness of breath but does have a lot of wheezing congestion especially in the mornings  when he wakes up and at night when he is trying asleep.  Relevant past medical, surgical, family and social history reviewed and updated as indicated. Interim medical history since our last visit reviewed. Allergies and medications reviewed and updated.  Review of Systems  Constitutional: Negative for chills and fever.  HENT: Positive for congestion, postnasal drip and rhinorrhea. Negative for ear discharge, ear pain, sinus pressure, sneezing, sore throat and voice change.   Eyes: Negative for pain, discharge, redness and visual disturbance.  Respiratory: Positive for cough and wheezing. Negative for shortness of breath.   Cardiovascular: Negative for chest pain and leg swelling.  Musculoskeletal: Negative for gait problem.  Skin: Negative for rash.  All other systems reviewed and are negative.   Per HPI unless specifically indicated above   Allergies as of 11/16/2018   No Known Allergies     Medication List        Accurate as of 11/16/18  1:33 PM. Always use your most recent med list.          Albuterol Sulfate 108 (90 Base) MCG/ACT Aepb Inhale 2 puffs into the lungs every 6 (six) hours as needed (shortness of breath/ wheeze).   azithromycin 250 MG tablet Commonly known as:  ZITHROMAX Take 2 the first day and then one each day after.   dipyridamole-aspirin 200-25 MG 12hr capsule Commonly known as:  AGGRENOX  Take 1 capsule by mouth 2 (two) times daily.   Fluticasone-Salmeterol 250-50 MCG/DOSE Aepb Commonly known as:  ADVAIR Inhale 1 puff into the lungs every 12 (twelve) hours.   loratadine 10 MG tablet Commonly known as:  CLARITIN Take 10 mg by mouth daily.   nebivolol 5 MG tablet Commonly known as:  BYSTOLIC Take 1 tablet (5 mg total) by mouth daily.   nitroGLYCERIN 0.4 MG SL tablet Commonly known as:  NITROSTAT Place 1 tablet (0.4 mg total) under the tongue every 5 (five) minutes as needed for chest pain.   predniSONE 20 MG tablet Commonly known as:   DELTASONE 2 po at same time daily for 5 days   rosuvastatin 40 MG tablet Commonly known as:  CRESTOR Take 1 tablet (40 mg total) by mouth daily.   tiotropium 18 MCG inhalation capsule Commonly known as:  SPIRIVA Place 1 capsule (18 mcg total) into inhaler and inhale daily.          Objective:    BP (!) 145/75   Pulse 68   Temp 97.7 F (36.5 C) (Oral)   Ht 5' 7"  (1.702 m)   Wt 149 lb 6.4 oz (67.8 kg)   SpO2 97%   BMI 23.40 kg/m   Wt Readings from Last 3 Encounters:  11/16/18 149 lb 6.4 oz (67.8 kg)  04/09/18 151 lb 12.8 oz (68.9 kg)  03/30/18 156 lb (70.8 kg)    Physical Exam  Constitutional: He is oriented to person, place, and time. He appears well-developed and well-nourished. No distress.  HENT:  Right Ear: Tympanic membrane, external ear and ear canal normal.  Left Ear: Tympanic membrane, external ear and ear canal normal.  Nose: No sinus tenderness. No epistaxis. Right sinus exhibits no maxillary sinus tenderness and no frontal sinus tenderness. Left sinus exhibits no maxillary sinus tenderness and no frontal sinus tenderness.  Mouth/Throat: Uvula is midline and mucous membranes are normal. Posterior oropharyngeal edema present. No oropharyngeal exudate, posterior oropharyngeal erythema or tonsillar abscesses.  Eyes: Pupils are equal, round, and reactive to light. Conjunctivae and EOM are normal. Right eye exhibits no discharge. No scleral icterus.  Neck: Neck supple. No thyromegaly present.  Cardiovascular: Normal rate, regular rhythm, normal heart sounds and intact distal pulses.  No murmur heard. Pulmonary/Chest: Effort normal and breath sounds normal. No respiratory distress. He has no wheezes. He has no rales.  Musculoskeletal: Normal range of motion. He exhibits no edema.  Lymphadenopathy:    He has no cervical adenopathy.  Neurological: He is alert and oriented to person, place, and time. Coordination normal.  Skin: Skin is warm and dry. No rash noted. He is  not diaphoretic.  Psychiatric: He has a normal mood and affect. His behavior is normal.  Nursing note and vitals reviewed.       Assessment & Plan:   Problem List Items Addressed This Visit      Cardiovascular and Mediastinum   HTN (hypertension)   Relevant Medications   nebivolol (BYSTOLIC) 5 MG tablet   rosuvastatin (CRESTOR) 40 MG tablet   Other Relevant Orders   CMP14+EGFR     Respiratory   COPD (chronic obstructive pulmonary disease) (HCC)   Relevant Medications   predniSONE (DELTASONE) 20 MG tablet   azithromycin (ZITHROMAX) 250 MG tablet     Other   Hyperlipidemia   Relevant Medications   nebivolol (BYSTOLIC) 5 MG tablet   rosuvastatin (CRESTOR) 40 MG tablet   Other Relevant Orders   Lipid panel  Tobacco abuse   Prediabetes - Primary   Relevant Orders   CMP14+EGFR   Bayer DCA Hb A1c Waived    Other Visit Diagnoses    COPD with acute exacerbation (HCC)       Relevant Medications   predniSONE (DELTASONE) 20 MG tablet   azithromycin (ZITHROMAX) 250 MG tablet    Will do prednisone and Z-Pak for his COPD exacerbation which is mild at this point, recommended doing the Advair more consistently but patient will keep track of how often he gets sick over this winter and then consider it.  Check blood work today and give refills of chronic medications  Follow up plan: Return in about 6 months (around 05/18/2019), or if symptoms worsen or fail to improve, for Hypertension cholesterol and prediabetes recheck.  Counseling provided for all of the vaccine components Orders Placed This Encounter  Procedures  . CMP14+EGFR  . Lipid panel  . [facility] Hb A1c [place], MD [facility] 11/16/2018, 1:33 PM

## 2018-11-17 LAB — CMP14+EGFR
ALBUMIN: 4 g/dL (ref 3.5–4.8)
ALT: 11 IU/L (ref 0–44)
AST: 14 IU/L (ref 0–40)
Albumin/Globulin Ratio: 1.5 (ref 1.2–2.2)
Alkaline Phosphatase: 92 IU/L (ref 39–117)
BUN / CREAT RATIO: 11 (ref 10–24)
BUN: 11 mg/dL (ref 8–27)
Bilirubin Total: 0.4 mg/dL (ref 0.0–1.2)
CALCIUM: 9.2 mg/dL (ref 8.6–10.2)
CO2: 26 mmol/L (ref 20–29)
CREATININE: 0.96 mg/dL (ref 0.76–1.27)
Chloride: 102 mmol/L (ref 96–106)
GFR calc Af Amer: 90 mL/min/{1.73_m2} (ref >59)
GFR, EST NON AFRICAN AMERICAN: 78 mL/min/{1.73_m2} (ref >59)
GLOBULIN, TOTAL: 2.7 g/dL (ref 1.5–4.5)
GLUCOSE: 103 mg/dL — AB (ref 65–99)
Potassium: 4.1 mmol/L (ref 3.5–5.2)
SODIUM: 140 mmol/L (ref 134–144)
Total Protein: 6.7 g/dL (ref 6.0–8.5)

## 2018-11-17 LAB — LIPID PANEL
CHOL/HDL RATIO: 2.5 ratio (ref 0.0–5.0)
CHOLESTEROL TOTAL: 98 mg/dL — AB (ref 100–199)
HDL: 40 mg/dL (ref >39)
LDL CALC: 41 mg/dL (ref 0–99)
TRIGLYCERIDES: 86 mg/dL (ref 0–149)
VLDL CHOLESTEROL CAL: 17 mg/dL (ref 5–40)

## 2019-04-19 DIAGNOSIS — H353132 Nonexudative age-related macular degeneration, bilateral, intermediate dry stage: Secondary | ICD-10-CM | POA: Diagnosis not present

## 2019-04-19 DIAGNOSIS — H25013 Cortical age-related cataract, bilateral: Secondary | ICD-10-CM | POA: Diagnosis not present

## 2019-04-19 DIAGNOSIS — H2513 Age-related nuclear cataract, bilateral: Secondary | ICD-10-CM | POA: Diagnosis not present

## 2019-04-19 DIAGNOSIS — H35033 Hypertensive retinopathy, bilateral: Secondary | ICD-10-CM | POA: Diagnosis not present

## 2019-05-05 DIAGNOSIS — H2512 Age-related nuclear cataract, left eye: Secondary | ICD-10-CM | POA: Diagnosis not present

## 2019-05-05 DIAGNOSIS — H25013 Cortical age-related cataract, bilateral: Secondary | ICD-10-CM | POA: Diagnosis not present

## 2019-05-05 DIAGNOSIS — H2513 Age-related nuclear cataract, bilateral: Secondary | ICD-10-CM | POA: Diagnosis not present

## 2019-05-05 DIAGNOSIS — H25012 Cortical age-related cataract, left eye: Secondary | ICD-10-CM | POA: Diagnosis not present

## 2019-05-22 DIAGNOSIS — H25011 Cortical age-related cataract, right eye: Secondary | ICD-10-CM | POA: Diagnosis not present

## 2019-05-22 DIAGNOSIS — H2511 Age-related nuclear cataract, right eye: Secondary | ICD-10-CM | POA: Diagnosis not present

## 2019-05-23 ENCOUNTER — Other Ambulatory Visit: Payer: Self-pay

## 2019-05-24 ENCOUNTER — Ambulatory Visit (INDEPENDENT_AMBULATORY_CARE_PROVIDER_SITE_OTHER): Payer: Medicare Other | Admitting: Family Medicine

## 2019-05-24 ENCOUNTER — Encounter: Payer: Self-pay | Admitting: Family Medicine

## 2019-05-24 VITALS — BP 144/72 | HR 64 | Temp 97.9°F | Ht 67.0 in | Wt 153.6 lb

## 2019-05-24 DIAGNOSIS — E782 Mixed hyperlipidemia: Secondary | ICD-10-CM | POA: Diagnosis not present

## 2019-05-24 DIAGNOSIS — R7303 Prediabetes: Secondary | ICD-10-CM | POA: Diagnosis not present

## 2019-05-24 DIAGNOSIS — J411 Mucopurulent chronic bronchitis: Secondary | ICD-10-CM | POA: Diagnosis not present

## 2019-05-24 LAB — BAYER DCA HB A1C WAIVED: HB A1C (BAYER DCA - WAIVED): 6.2 % (ref <7.0)

## 2019-05-24 MED ORDER — FLUTICASONE-SALMETEROL 250-50 MCG/DOSE IN AEPB: 1.0000 | INHALATION_SPRAY | Freq: Two times a day (BID) | RESPIRATORY_TRACT | 1 refills | Status: DC

## 2019-05-24 MED ORDER — ROSUVASTATIN CALCIUM 40 MG PO TABS: 40.0000 mg | ORAL_TABLET | Freq: Every day | ORAL | 3 refills | Status: DC

## 2019-05-24 NOTE — Progress Notes (Signed)
BP (!) 144/72   Pulse 64   Temp 97.9 F (36.6 C) (Oral)   Ht 5' 7"  (1.702 m)   Wt 153 lb 9.6 oz (69.7 kg)   BMI 24.06 kg/m    Subjective:   [name], male    DOB: [birth year], 73 y.o.   MRN: [id number]  HPI: Luis Salinas is a 73 y.o. male presenting on 05/24/2019 for Hypertension (6 month follow up) and Hyperlipidemia   HPI Prediabetes Patient comes in today for recheck of his diabetes. Patient has been currently taking no medication and has been diet controlled and we are just monitoring. Patient is not currently on an ACE inhibitor/ARB. Patient has seen an ophthalmologist this year, he just had 1 of his cataracts replaced and has the other one planned. Patient denies any issues with their feet.   COPD Patient is coming in for COPD recheck today.  He is currently on Advair.  He has a mild chronic cough but denies any major coughing spells or wheezing spells.  He has 0nighttime symptoms per week and 0daytime symptoms per week currently.  Patient still smoking cigars about 3 to 4/day  Hyperlipidemia Patient is coming in for recheck of his hyperlipidemia. The patient is currently taking Crestor. They deny any issues with myalgias or history of liver damage from it. They deny any focal numbness or weakness or chest pain.   Relevant past medical, surgical, family and social history reviewed and updated as indicated. Interim medical history since our last visit reviewed. Allergies and medications reviewed and updated.  Review of Systems  Constitutional: Negative for chills and fever.  Eyes: Negative for visual disturbance.  Respiratory: Negative for shortness of breath and wheezing.   Cardiovascular: Negative for chest pain and leg swelling.  Musculoskeletal: Negative for back pain and gait problem.  Skin: Negative for rash.  Neurological: Negative for dizziness, weakness and light-headedness.  All other systems reviewed and are negative.   Per HPI unless  specifically indicated above   Allergies as of 05/24/2019   No Known Allergies     Medication List       Accurate as of May 24, 2019  3:58 PM. If you have any questions, ask your nurse or doctor.        STOP taking these medications   azithromycin 250 MG tablet Commonly known as:  ZITHROMAX Stopped by:  Fransisca Kaufmann Watson Robarge, MD   predniSONE 20 MG tablet Commonly known as:  DELTASONE Stopped by:  Fransisca Kaufmann Yaxiel Minnie, MD     TAKE these medications   Albuterol Sulfate 108 (90 Base) MCG/ACT Aepb Inhale 2 puffs into the lungs every 6 (six) hours as needed (shortness of breath/ wheeze).   dipyridamole-aspirin 200-25 MG 12hr capsule Commonly known as:  AGGRENOX Take 1 capsule by mouth 2 (two) times daily.   Fluticasone-Salmeterol 250-50 MCG/DOSE Aepb Commonly known as:  Advair Diskus Inhale 1 puff into the lungs every 12 (twelve) hours.   loratadine 10 MG tablet Commonly known as:  CLARITIN Take 10 mg by mouth daily.   nebivolol 5 MG tablet Commonly known as:  Bystolic Take 1 tablet (5 mg total) by mouth daily.   nitroGLYCERIN 0.4 MG SL tablet Commonly known as:  NITROSTAT Place 1 tablet (0.4 mg total) under the tongue every 5 (five) minutes as needed for chest pain.   rosuvastatin 40 MG tablet Commonly known as:  CRESTOR Take 1 tablet (40 mg total) by mouth daily.  Objective:   BP (!) 144/72   Pulse 64   Temp 97.9 F (36.6 C) (Oral)   Ht 5' 7"  (1.702 m)   Wt 153 lb 9.6 oz (69.7 kg)   BMI 24.06 kg/m   Wt Readings from Last 3 Encounters:  05/24/19 153 lb 9.6 oz (69.7 kg)  11/16/18 149 lb 6.4 oz (67.8 kg)  04/09/18 151 lb 12.8 oz (68.9 kg)    Physical Exam Vitals signs and nursing note reviewed.  Constitutional:      General: He is not in acute distress.    Appearance: He is well-developed. He is not diaphoretic.  Eyes:     General: No scleral icterus.    Conjunctiva/sclera: Conjunctivae normal.  Neck:     Musculoskeletal: Neck supple.      Thyroid: No thyromegaly.  Cardiovascular:     Rate and Rhythm: Normal rate and regular rhythm.     Heart sounds: Normal heart sounds. No murmur.  Pulmonary:     Effort: Pulmonary effort is normal. No respiratory distress.     Breath sounds: Normal breath sounds. No wheezing.  Musculoskeletal: Normal range of motion.  Lymphadenopathy:     Cervical: No cervical adenopathy.  Skin:    General: Skin is warm and dry.     Findings: No rash.  Neurological:     Mental Status: He is alert and oriented to person, place, and time.     Coordination: Coordination normal.  Psychiatric:        Behavior: Behavior normal.       Assessment & Plan:   Problem List Items Addressed This Visit      Respiratory   COPD (chronic obstructive pulmonary disease) (HCC)   Relevant Medications   Fluticasone-Salmeterol (ADVAIR DISKUS) 250-50 MCG/DOSE AEPB   Other Relevant Orders   CBC with Differential/Platelet     Other   Hyperlipidemia   Relevant Medications   rosuvastatin (CRESTOR) 40 MG tablet   Other Relevant Orders   Lipid panel   Prediabetes - Primary   Relevant Orders   CMP14+EGFR   Bayer DCA Hb A1c Waived      Continue Crestor and Advair, will check blood work today and follow-up in 6 months Follow up plan: Return in about 6 months (around 11/23/2019), or if symptoms worsen or fail to improve, for Prediabetes and cholesterol and COPD.  Counseling provided for all of the vaccine components Orders Placed This Encounter  Procedures  . CBC with Differential/Platelet  . CMP14+EGFR  . Bayer DCA Hb A1c Waived  . Lipid panel    Caryl Pina, MD [facility] 05/24/2019, 3:58 PM

## 2019-05-25 LAB — SPECIMEN STATUS REPORT

## 2019-05-25 LAB — CBC WITH DIFFERENTIAL/PLATELET

## 2019-05-26 LAB — LIPID PANEL
Chol/HDL Ratio: 2.5 ratio (ref 0.0–5.0)
Cholesterol, Total: 86 mg/dL — ABNORMAL LOW (ref 100–199)
HDL: 35 mg/dL — ABNORMAL LOW (ref >39)
LDL Calculated: 29 mg/dL (ref 0–99)
Triglycerides: 109 mg/dL (ref 0–149)
VLDL Cholesterol Cal: 22 mg/dL (ref 5–40)

## 2019-05-26 LAB — CMP14+EGFR
ALT: 15 IU/L (ref 0–44)
AST: 19 IU/L (ref 0–40)
Albumin/Globulin Ratio: 1.4 (ref 1.2–2.2)
Albumin: 3.9 g/dL (ref 3.7–4.7)
Alkaline Phosphatase: 110 IU/L (ref 39–117)
BUN/Creatinine Ratio: 14 (ref 10–24)
BUN: 12 mg/dL (ref 8–27)
Bilirubin Total: 0.4 mg/dL (ref 0.0–1.2)
CO2: 25 mmol/L (ref 20–29)
Calcium: 8.7 mg/dL (ref 8.6–10.2)
Chloride: 105 mmol/L (ref 96–106)
Creatinine, Ser: 0.88 mg/dL (ref 0.76–1.27)
GFR calc Af Amer: 99 mL/min/{1.73_m2} (ref >59)
GFR calc non Af Amer: 85 mL/min/{1.73_m2} (ref >59)
Globulin, Total: 2.7 g/dL (ref 1.5–4.5)
Glucose: 106 mg/dL — ABNORMAL HIGH (ref 65–99)
Potassium: 3.8 mmol/L (ref 3.5–5.2)
Sodium: 141 mmol/L (ref 134–144)
Total Protein: 6.6 g/dL (ref 6.0–8.5)

## 2019-05-26 LAB — CBC WITH DIFFERENTIAL/PLATELET
Basophils Absolute: 0.1 10*3/uL (ref 0.0–0.2)
Basos: 1 %
EOS (ABSOLUTE): 0.2 10*3/uL (ref 0.0–0.4)
Eos: 3 %
Hematocrit: 50.6 % (ref 37.5–51.0)
Hemoglobin: 15.8 g/dL (ref 13.0–17.7)
Immature Grans (Abs): 0.1 10*3/uL (ref 0.0–0.1)
Immature Granulocytes: 1 %
Lymphocytes Absolute: 2 10*3/uL (ref 0.7–3.1)
Lymphs: 24 %
MCH: 28.9 pg (ref 26.6–33.0)
MCHC: 31.2 g/dL — ABNORMAL LOW (ref 31.5–35.7)
MCV: 93 fL (ref 79–97)
Monocytes Absolute: 0.6 10*3/uL (ref 0.1–0.9)
Monocytes: 7 %
Neutrophils Absolute: 5.3 10*3/uL (ref 1.4–7.0)
Neutrophils: 64 %
Platelets: 146 10*3/uL — ABNORMAL LOW (ref 150–450)
RBC: 5.46 x10E6/uL (ref 4.14–5.80)
RDW: 15.6 % — ABNORMAL HIGH (ref 11.6–15.4)
WBC: 8.2 10*3/uL (ref 3.4–10.8)

## 2019-05-30 DIAGNOSIS — H2511 Age-related nuclear cataract, right eye: Secondary | ICD-10-CM | POA: Diagnosis not present

## 2019-05-30 DIAGNOSIS — H25011 Cortical age-related cataract, right eye: Secondary | ICD-10-CM | POA: Diagnosis not present

## 2019-05-30 DIAGNOSIS — H25811 Combined forms of age-related cataract, right eye: Secondary | ICD-10-CM | POA: Diagnosis not present

## 2019-06-20 DIAGNOSIS — Z961 Presence of intraocular lens: Secondary | ICD-10-CM | POA: Diagnosis not present

## 2019-06-26 ENCOUNTER — Other Ambulatory Visit: Payer: Self-pay

## 2019-06-26 ENCOUNTER — Encounter: Payer: Self-pay | Admitting: *Deleted

## 2019-06-26 ENCOUNTER — Ambulatory Visit (INDEPENDENT_AMBULATORY_CARE_PROVIDER_SITE_OTHER): Payer: Medicare Other | Admitting: *Deleted

## 2019-06-26 DIAGNOSIS — Z0000 Encounter for general adult medical examination without abnormal findings: Secondary | ICD-10-CM | POA: Diagnosis not present

## 2019-06-26 NOTE — Patient Instructions (Signed)
Preventive Care 74 Years and Older, Male Preventive care refers to lifestyle choices and visits with your health care provider that can promote health and wellness. This includes:  A yearly physical exam. This is also called an annual well check.  Regular dental and eye exams.  Immunizations.  Screening for certain conditions.  Healthy lifestyle choices, such as diet and exercise. What can I expect for my preventive care visit? Physical exam Your health care provider will check:  Height and weight. These may be used to calculate body mass index (BMI), which is a measurement that tells if you are at a healthy weight.  Heart rate and blood pressure.  Your skin for abnormal spots. Counseling Your health care provider may ask you questions about:  Alcohol, tobacco, and drug use.  Emotional well-being.  Home and relationship well-being.  Sexual activity.  Eating habits.  History of falls.  Memory and ability to understand (cognition).  Work and work Statistician. What immunizations do I need?  Influenza (flu) vaccine  This is recommended every year. Tetanus, diphtheria, and pertussis (Tdap) vaccine  You may need a Td booster every 10 years. Varicella (chickenpox) vaccine  You may need this vaccine if you have not already been vaccinated. Zoster (shingles) vaccine  You may need this after age 50. Pneumococcal conjugate (PCV13) vaccine  One dose is recommended after age 24. Pneumococcal polysaccharide (PPSV23) vaccine  One dose is recommended after age 33. Measles, mumps, and rubella (MMR) vaccine  You may need at least one dose of MMR if you were born in 1957 or later. You may also need a second dose. Meningococcal conjugate (MenACWY) vaccine  You may need this if you have certain conditions. Hepatitis A vaccine  You may need this if you have certain conditions or if you travel or work in places where you may be exposed to hepatitis A. Hepatitis B vaccine   You may need this if you have certain conditions or if you travel or work in places where you may be exposed to hepatitis B. Haemophilus influenzae type b (Hib) vaccine  You may need this if you have certain conditions. You may receive vaccines as individual doses or as more than one vaccine together in one shot (combination vaccines). Talk with your health care provider about the risks and benefits of combination vaccines. What tests do I need? Blood tests  Lipid and cholesterol levels. These may be checked every 5 years, or more frequently depending on your overall health.  Hepatitis C test.  Hepatitis B test. Screening  Lung cancer screening. You may have this screening every year starting at age 74 if you have a 30-pack-year history of smoking and currently smoke or have quit within the past 15 years.  Colorectal cancer screening. All adults should have this screening starting at age 74 and continuing until age 54. Your health care provider may recommend screening at age 74 if you are at increased risk. You will have tests every 1-10 years, depending on your results and the type of screening test.  Prostate cancer screening. Recommendations will vary depending on your family history and other risks.  Diabetes screening. This is done by checking your blood sugar (glucose) after you have not eaten for a while (fasting). You may have this done every 1-3 years.  Abdominal aortic aneurysm (AAA) screening. You may need this if you are a current or former smoker.  Sexually transmitted disease (STD) testing. Follow these instructions at home: Eating and drinking  Eat  a diet that includes fresh fruits and vegetables, whole grains, lean protein, and low-fat dairy products. Limit your intake of foods with high amounts of sugar, saturated fats, and salt.  Take vitamin and mineral supplements as recommended by your health care provider.  Do not drink alcohol if your health care provider  tells you not to drink.  If you drink alcohol: ? Limit how much you have to 0-2 drinks a day. ? Be aware of how much alcohol is in your drink. In the [place] one drink equals one 12 oz bottle of beer (355 mL), one 5 oz glass of wine (148 mL), or one 1 oz glass of hard liquor (44 mL). Lifestyle  Take daily care of your teeth and gums.  Stay active. Exercise for at least 30 minutes on 5 or more days each week.  Do not use any products that contain nicotine or tobacco, such as cigarettes, e-cigarettes, and chewing tobacco. If you need help quitting, ask your health care provider.  If you are sexually active, practice safe sex. Use a condom or other form of protection to prevent STIs (sexually transmitted infections).  Talk with your health care provider about taking a low-dose aspirin or statin. What's next?  Visit your health care provider once a year for a well check visit.  Ask your health care provider how often you should have your eyes and teeth checked.  Stay up to date on all vaccines. This information is not intended to replace advice given to you by your health care provider. Make sure you discuss any questions you have with your health care provider. Document Released: 12/27/2015 Document Revised: 11/24/2018 Document Reviewed: 11/24/2018 Elsevier Patient Education  2020 [employer]  

## 2019-06-26 NOTE — Progress Notes (Signed)
MEDICARE ANNUAL WELLNESS VISIT  06/26/2019  Telephone Visit Disclaimer This Medicare AWV was conducted by telephone due to national recommendations for restrictions regarding the COVID-19 Pandemic (e.g. social distancing).  I verified, using two identifiers, that I am speaking with Luis Salinas or their authorized healthcare agent. I discussed the limitations, risks, security, and privacy concerns of performing an evaluation and management service by telephone and the potential availability of an in-person appointment in the future. The patient expressed understanding and agreed to proceed.   Subjective:  Luis Salinas is a 73 y.o. male patient of Luis Salinas, Luis Radon, MD who had a Medicare Annual Wellness Visit today via telephone. Luis Salinas is Retired and lives with their spouse. he has raised 7 children. he reports that he is socially active and does interact with friends/family regularly. he is minimally physically active and enjoys pencil art and loves all animals.  Patient Care Team: Luis Salinas, Luis Radon, MD as PCP - General (Family Medicine) Micki Riley, MD as Consulting Physician (Neurology) Iva Boop, MD as Consulting Physician (Gastroenterology)  Advanced Directives 06/26/2019 03/30/2018 10/20/2016 05/17/2015 04/02/2013  Does Patient Have a Medical Advance Directive? No No No Yes Patient does not have advance directive;Patient would like information  Type of Advance Directive - - - Living will -  Does patient want to make changes to medical advance directive? - - - No - Patient declined -  Copy of Healthcare Power of Attorney in Chart? - - - No - copy requested -  Would patient like information on creating a medical advance directive? No - Patient declined No - Patient declined Yes - Educational materials given No - patient declined information Referral made to social work  Pre-existing out of facility DNR order (yellow form or pink MOST form) - - - - No    Hospital  Utilization Over the Past 12 Months: # of hospitalizations or ER visits: 0 # of surgeries: 2  Review of Systems    Patient reports that his overall health is better compared to last year.  Patient Reported Readings (BP, Pulse, CBG, Weight, etc) none  Review of Systems: No complaints  All other systems negative.  Pain Assessment Pain : No/denies pain     Current Medications & Allergies (verified) Allergies as of 06/26/2019   No Known Allergies     Medication List       Accurate as of June 26, 2019  3:13 PM. If you have any questions, ask your nurse or doctor.        Albuterol Sulfate 108 (90 Base) MCG/ACT Aepb Inhale 2 puffs into the lungs every 6 (six) hours as needed (shortness of breath/ wheeze).   dipyridamole-aspirin 200-25 MG 12hr capsule Commonly known as: AGGRENOX Take 1 capsule by mouth 2 (two) times daily.   Fluticasone-Salmeterol 250-50 MCG/DOSE Aepb Commonly known as: Advair Diskus Inhale 1 puff into the lungs every 12 (twelve) hours.   loratadine 10 MG tablet Commonly known as: CLARITIN Take 10 mg by mouth daily.   nebivolol 5 MG tablet Commonly known as: Bystolic Take 1 tablet (5 mg total) by mouth daily.   nitroGLYCERIN 0.4 MG SL tablet Commonly known as: NITROSTAT Place 1 tablet (0.4 mg total) under the tongue every 5 (five) minutes as needed for chest pain.   rosuvastatin 40 MG tablet Commonly known as: CRESTOR Take 1 tablet (40 mg total) by mouth daily.       History (reviewed): Past Medical History:  Diagnosis Date  .  Amnesia    He was found and reuintied with his family in [id number] after 27 years.  . 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 His family found him after 27 years  . Arthritis   . Cataract   . Concussion    hx of  . COPD (chronic obstructive pulmonary disease) (HCC)   . Myocardial infarction [facility](HCC)    2007 [facility]  . Pneumonia    exposure to caustic chemicals  . Stroke [facility](HCC)    multiple strokes    Past Surgical History:  Procedure Laterality Date  . ANTERIOR CERVICAL DECOMP/DISCECTOMY FUSION  05/16/2012   Procedure: ANTERIOR CERVICAL DECOMPRESSION/DISCECTOMY FUSION 2 LEVELS;  Surgeon: Barnett AbuHenry Elsner, MD;  Location: [facility];  Service: Neurosurgery;  Laterality: N/A;  Cervical Five-Six, Cervical Six-Seven Anterior Cervical Decompression/Diskectomy Fusion  . CARDIAC CATHETERIZATION    . COLONOSCOPY W/ POLYPECTOMY    . CORONARY ANGIOPLASTY    . EYE SURGERY     cataracts  . JOINT REPLACEMENT     Left knee replacement  . JOINT REPLACEMENT     left elbow at age 73  . ROTATOR CUFF REPAIR     left  . TONSILLECTOMY     Family History  Problem Relation Age of Onset  . Heart disease Mother   . COPD Father   . Aneurysm Father   . Anesthesia problems Neg Hx   . Hypotension Neg Hx   . Malignant hyperthermia Neg Hx   . Pseudochol deficiency Neg Hx    Social History   Socioeconomic History  . Marital status: Married    Spouse name: Luis Salinas  . Number of children: 7  . Years of education: college  . Highest education level: Some college, no degree  Occupational History  . Occupation: Retired    Comment: Geophysical data processorbuilder  Social Needs  . Financial resource strain: Not hard at all  . Food insecurity    Worry: Never true    Inability: Never true  . Transportation needs    Medical: No    Non-medical: No  Tobacco Use  . Smoking status: Light Tobacco Smoker    Types: Cigars    Last attempt to quit: 04/03/2013    Years since quitting: 6.2  . Smokeless tobacco: Never Used  . Tobacco comment: One cigar per day.  Smoked 40 years cigarettes.  10 years ago quit cigarettes.  Substance and Sexual Activity  . Alcohol use: No    Alcohol/week: 0.0 standard drinks  . Drug use: No    Types: Marijuana  . Sexual activity: Yes  Lifestyle  . Physical activity    Days per week: 6 days    Minutes per session: 10 min  . Stress: Not at all  Relationships  . Social connections    Talks on phone: More  than three times a week    Gets together: More than three times a week    Attends religious service: More than 4 times per year    Active member of club or organization: Yes    Attends meetings of clubs or organizations: More than 4 times per year    Relationship status: Married  Other Topics Concern  . Not on file  Social History Narrative   Lives with his fourth wife.       Activities of Daily Living In your present state of health, do you have any difficulty performing the following activities: 06/26/2019  Hearing? N  Vision?  N  Comment just had 2 cataract surgeries 4 and 8 weeks ago  Difficulty concentrating or making decisions? N  Walking or climbing stairs? N  Dressing or bathing? N  Doing errands, shopping? N  Preparing Food and eating ? N  Using the Toilet? N  In the past six months, have you accidently leaked urine? N  Do you have problems with loss of bowel control? N  Managing your Medications? Y  Comment wife puts his meds in a pill box  Managing your Finances? N  Housekeeping or managing your Housekeeping? N  Some recent data might be hidden    Patient Literacy How often do you need to have someone help you when you read instructions, pamphlets, or other written materials from your doctor or pharmacy?: 1 - Never What is the last grade level you completed in school?: some college-no degree  Exercise Current Exercise Habits: Home exercise routine, Type of exercise: walking, Time (Minutes): 10, Frequency (Times/Week): 6, Weekly Exercise (Minutes/Week): 60, Intensity: Mild, Exercise limited by: None identified  Diet Patient reports consuming 5-6 small meals a day and 0 snack(s) a day Patient reports that his primary diet is: Regular Patient reports that she does have regular access to food.   Depression Screen PHQ 2/9 Scores 06/26/2019 05/24/2019 11/16/2018 04/09/2018 02/17/2018 08/19/2017 06/11/2017  PHQ - 2 Score 0 0 0 0 0 0 0     Fall Risk Fall Risk  06/26/2019  05/24/2019 11/16/2018 04/09/2018 02/17/2018  Falls in the past year? 0 0 0 No No  Number falls in past yr: 0 - - - -  Injury with Fall? 0 - - - -     Objective:  Luis Salinas seemed alert and oriented and he participated appropriately during our telephone visit.  Blood Pressure Weight BMI  BP Readings from Last 3 Encounters:  05/24/19 (!) 144/72  11/16/18 (!) 145/75  04/09/18 130/69   Wt Readings from Last 3 Encounters:  05/24/19 153 lb 9.6 oz (69.7 kg)  11/16/18 149 lb 6.4 oz (67.8 kg)  04/09/18 151 lb 12.8 oz (68.9 kg)   BMI Readings from Last 1 Encounters:  05/24/19 24.06 kg/m    *Unable to obtain current vital signs, weight, and BMI due to telephone visit type  Hearing/Vision  . Luis Salinas did not seem to have difficulty with hearing/understanding during the telephone conversation . Reports that he has not had a formal eye exam by an eye care professional within the past year . Reports that he has not had a formal hearing evaluation within the past year *Unable to fully assess hearing and vision during telephone visit type  Cognitive Function: 6CIT Screen 06/26/2019  What Year? 0 points  What month? 0 points  What time? 0 points  Count back from 20 0 points  Months in reverse 2 points  Repeat phrase 4 points  Total Score 6   (Normal:0-7, Significant for Dysfunction: >8)  Normal Cognitive Function Screening: Yes   Immunization & Health Maintenance Record Immunization History  Administered Date(s) Administered  . Pneumococcal Conjugate-13 03/29/2015  . Pneumococcal Polysaccharide-23 10/14/2002, 12/27/2013  . Td 12/14/2008    Health Maintenance  Topic Date Due  . Hepatitis C Screening  11-26-1946  . COLONOSCOPY  12/28/2019  . TETANUS/TDAP  12/28/2023  . PNA vac Low Risk Adult  Completed  . INFLUENZA VACCINE  Discontinued       Assessment  This is a routine wellness examination for Luis SagoWilliam H Salinas.  Health Maintenance: Due or  Overdue Health Maintenance Due   Topic Date Due  . Hepatitis C Screening  1946/03/16    Luis Salinas does not need a referral for Community Assistance: Care Management:   no Social Work:    no Prescription Assistance:  no Nutrition/Diabetes Education:  no   Plan:  Personalized Goals Goals Addressed            This Visit's Progress   . DIET - INCREASE WATER INTAKE       Try to drink 6-8 glasses of water daily.      Personalized Health Maintenance & Screening Recommendations  Td vaccine Shingles vaccine  Lung Cancer Screening Recommended: no (Low Dose CT Chest recommended if Age 38-80 years, 30 pack-year currently smoking OR have quit w/in past 15 years) Hepatitis C Screening recommended: yes HIV Screening recommended: no  Advanced Directives: Written information was not prepared per patient's request.  Referrals & Orders No orders of the defined types were placed in this encounter.   Follow-up Plan . Follow-up with Luis Salinas, Fransisca Kaufmann, MD as planned . Consider TDAP and Shingles vaccines at your next visit with your PCP   I have personally reviewed and noted the following in the patient's chart:   . Medical and social history . Use of alcohol, tobacco or illicit drugs  . Current medications and supplements . Functional ability and status . Nutritional status . Physical activity . Advanced directives . List of other physicians . Hospitalizations, surgeries, and ER visits in previous 12 months . Vitals . Screenings to include cognitive, depression, and falls . Referrals and appointments  In addition, I have reviewed and discussed with Luis Salinas certain preventive protocols, quality metrics, and best practice recommendations. A written personalized care plan for preventive services as well as general preventive health recommendations is available and can be mailed to the patient at his request.      Luis Crosby, LPN  01/27/864

## 2019-08-15 ENCOUNTER — Other Ambulatory Visit: Payer: Self-pay

## 2019-08-15 ENCOUNTER — Encounter: Payer: Self-pay | Admitting: Family Medicine

## 2019-08-15 ENCOUNTER — Ambulatory Visit (INDEPENDENT_AMBULATORY_CARE_PROVIDER_SITE_OTHER): Payer: Medicare Other | Admitting: Family Medicine

## 2019-08-15 VITALS — BP 163/75 | HR 68 | Temp 96.6°F | Ht 67.0 in | Wt 151.0 lb

## 2019-08-15 DIAGNOSIS — M10041 Idiopathic gout, right hand: Secondary | ICD-10-CM | POA: Diagnosis not present

## 2019-08-15 MED ORDER — PREDNISONE 20 MG PO TABS: ORAL_TABLET | ORAL | 0 refills | Status: DC
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
## 2019-08-15 NOTE — Patient Instructions (Signed)
Gout  Gout is painful swelling of your joints. Gout is a type of arthritis. It is caused by having too much uric acid in your body. Uric acid is a chemical that is made when your body breaks down substances called purines. If your body has too much uric acid, sharp crystals can form and build up in your joints. This causes pain and swelling. Gout attacks can happen quickly and be very painful (acute gout). Over time, the attacks can affect more joints and happen more often (chronic gout). What are the causes?  Too much uric acid in your blood. This can happen because: ? Your kidneys do not remove enough uric acid from your blood. ? Your body makes too much uric acid. ? You eat too many foods that are high in purines. These foods include organ meats, some seafood, and beer.  Trauma or stress. What increases the risk?  Having a family history of gout.  Being male and middle-aged.  Being male and having gone through menopause.  Being very overweight (obese).  Drinking alcohol, especially beer.  Not having enough water in the body (being dehydrated).  Losing weight too quickly.  Having an organ transplant.  Having lead poisoning.  Taking certain medicines.  Having kidney disease.  Having a skin condition called psoriasis. What are the signs or symptoms? An attack of acute gout usually happens in just one joint. The most common place is the big toe. Attacks often start at night. Other joints that may be affected include joints of the feet, ankle, knee, fingers, wrist, or elbow. Symptoms of an attack may include:  Very bad pain.  Warmth.  Swelling.  Stiffness.  Shiny, red, or purple skin.  Tenderness. The affected joint may be very painful to touch.  Chills and fever. Chronic gout may cause symptoms more often. More joints may be involved. You may also have white or yellow lumps (tophi) on your hands or feet or in other areas near your joints. How is this  treated?  Treatment for this condition has two phases: treating an acute attack and preventing future attacks.  Acute gout treatment may include: ? NSAIDs. ? Steroids. These are taken by mouth or injected into a joint. ? Colchicine. This medicine relieves pain and swelling. It can be given by mouth or through an IV tube.  Preventive treatment may include: ? Taking small doses of NSAIDs or colchicine daily. ? Using a medicine that reduces uric acid levels in your blood. ? Making changes to your diet. You may need to see a food expert (dietitian) about what to eat and drink to prevent gout. Follow these instructions at home: During a gout attack   If told, put ice on the painful area: ? Put ice in a plastic bag. ? Place a towel between your skin and the bag. ? Leave the ice on for 20 minutes, 2-3 times a day.  Raise (elevate) the painful joint above the level of your heart as often as you can.  Rest the joint as much as possible. If the joint is in your leg, you may be given crutches.  Follow instructions from your doctor about what you cannot eat or drink. Avoiding future gout attacks  Eat a low-purine diet. Avoid foods and drinks such as: ? Liver. ? Kidney. ? Anchovies. ? Asparagus. ? Herring. ? Mushrooms. ? Mussels. ? Beer.  Stay at a healthy weight. If you want to lose weight, talk with your doctor. Do not lose weight   too fast.  Start or continue an exercise plan as told by your doctor. Eating and drinking  Drink enough fluids to keep your pee (urine) pale yellow.  If you drink alcohol: ? Limit how much you use to:  0-1 drink a day for women.  0-2 drinks a day for men. ? Be aware of how much alcohol is in your drink. In the [place] one drink equals one 12 oz bottle of beer (355 mL), one 5 oz glass of wine (148 mL), or one 1 oz glass of hard liquor (44 mL). General instructions  Take over-the-counter and prescription medicines only as told by your doctor.  Do  not drive or use heavy machinery while taking prescription pain medicine.  Return to your normal activities as told by your doctor. Ask your doctor what activities are safe for you.  Keep all follow-up visits as told by your doctor. This is important. Contact a doctor if:  You have another gout attack.  You still have symptoms of a gout attack after 10 days of treatment.  You have problems (side effects) because of your medicines.  You have chills or a fever.  You have burning pain when you pee (urinate).  You have pain in your lower back or belly. Get help right away if:  You have very bad pain.  Your pain cannot be controlled.  You cannot pee. Summary  Gout is painful swelling of the joints.  The most common site of pain is the big toe, but it can affect other joints.  Medicines and avoiding some foods can help to prevent and treat gout attacks. This information is not intended to replace advice given to you by your health care provider. Make sure you discuss any questions you have with your health care provider. Document Released: 09/08/2008 Document Revised: 06/22/2018 Document Reviewed: 06/22/2018 Elsevier Patient Education  2020 [employer]vier Inc.  

## 2019-08-16 LAB — URIC ACID: Uric Acid: 2.9 mg/dL — ABNORMAL LOW (ref 3.7–8.6)

## 2019-08-16 NOTE — Progress Notes (Signed)
Correct time 1615, not 1515

## 2019-11-22 ENCOUNTER — Other Ambulatory Visit: Payer: Self-pay

## 2019-11-23 ENCOUNTER — Encounter: Payer: Self-pay | Admitting: Family Medicine

## 2019-11-23 ENCOUNTER — Ambulatory Visit (INDEPENDENT_AMBULATORY_CARE_PROVIDER_SITE_OTHER): Payer: Medicare Other | Admitting: Family Medicine

## 2019-11-23 VITALS — BP 135/72 | HR 60 | Temp 96.8°F | Ht 67.0 in | Wt 150.8 lb

## 2019-11-23 DIAGNOSIS — R7303 Prediabetes: Secondary | ICD-10-CM

## 2019-11-23 DIAGNOSIS — I10 Essential (primary) hypertension: Secondary | ICD-10-CM | POA: Diagnosis not present

## 2019-11-23 DIAGNOSIS — E782 Mixed hyperlipidemia: Secondary | ICD-10-CM

## 2019-11-23 LAB — BAYER DCA HB A1C WAIVED: HB A1C (BAYER DCA - WAIVED): 6.3 % (ref <7.0)

## 2019-11-23 MED ORDER — ASPIRIN-DIPYRIDAMOLE ER 25-200 MG PO CP12: 1.0000 | ORAL_CAPSULE | Freq: Two times a day (BID) | ORAL | 3 refills | Status: DC

## 2019-11-23 MED ORDER — NEBIVOLOL HCL 5 MG PO TABS: 5.0000 mg | ORAL_TABLET | Freq: Every day | ORAL | 3 refills | Status: DC

## 2019-11-23 NOTE — Progress Notes (Signed)
BP 135/72   Pulse 60   Temp (!) 96.8 F (36 C) (Temporal)   Ht _0  (1.702 m)   Wt 150 lb 12.8 oz (68.4 kg)   SpO2 95%   BMI 23.62 kg/m    Subjective:   [name], male    DOB: 01-07-1946, 73 y.o.   MRN: [id number]  HPI: Luis Salinas is a 73 y.o. male presenting on 11/23/2019 for Hypertension (6 month follow up) and Hyperlipidemia   HPI Hypertension Patient is currently on bystolic, and their blood pressure today is 135/72. Patient denies any lightheadedness or dizziness. Patient denies headaches, blurred vision, chest pains, shortness of breath, or weakness. Denies any side effects from medication and is content with current medication.   Hyperlipidemia Patient is coming in for recheck of his hyperlipidemia. The patient is currently taking crestor. They deny any issues with myalgias or history of liver damage from it. They deny any focal numbness or weakness or chest pain.   Prediabetes Patient comes in today for recheck of his diabetes. Patient has been currently taking no medication but we are monitoring, las ta1c was 6.2. Patient is not currently on an ACE inhibitor/ARB. Patient has not seen an ophthalmologist this year. Patient denies any issues with their feet.   Relevant past medical, surgical, family and social history reviewed and updated as indicated. Interim medical history since our last visit reviewed. Allergies and medications reviewed and updated.  Review of Systems  Constitutional: Negative for chills and fever.  Eyes: Negative for visual disturbance.  Respiratory: Negative for shortness of breath and wheezing.   Cardiovascular: Negative for chest pain and leg swelling.  Musculoskeletal: Negative for back pain and gait problem.  Skin: Negative for rash.  Neurological: Negative for dizziness, weakness and light-headedness.  All other systems reviewed and are negative.   Per HPI unless specifically indicated above   Allergies as of  11/23/2019   No Known Allergies     Medication List       Accurate as of November 23, 2019  8:29 AM. If you have any questions, ask your nurse or doctor.        STOP taking these medications   predniSONE 20 MG tablet Commonly known as: Deltasone Stopped by: Fransisca Kaufmann Dettinger, MD     TAKE these medications   Albuterol Sulfate 108 (90 Base) MCG/ACT Aepb Inhale 2 puffs into the lungs every 6 (six) hours as needed (shortness of breath/ wheeze).   dipyridamole-aspirin 200-25 MG 12hr capsule Commonly known as: AGGRENOX Take 1 capsule by mouth 2 (two) times daily.   Fluticasone-Salmeterol 250-50 MCG/DOSE Aepb Commonly known as: Advair Diskus Inhale 1 puff into the lungs every 12 (twelve) hours.   loratadine 10 MG tablet Commonly known as: CLARITIN Take 10 mg by mouth daily.   nebivolol 5 MG tablet Commonly known as: Bystolic Take 1 tablet (5 mg total) by mouth daily.   nitroGLYCERIN 0.4 MG SL tablet Commonly known as: NITROSTAT Place 1 tablet (0.4 mg total) under the tongue every 5 (five) minutes as needed for chest pain.   rosuvastatin 40 MG tablet Commonly known as: CRESTOR Take 1 tablet (40 mg total) by mouth daily.        Objective:   BP 135/72   Pulse 60   Temp (!) 96.8 F (36 C) (Temporal)   Ht _1  (1.702 m)   Wt 150 lb 12.8 oz (68.4 kg)   SpO2 95%   BMI 23.62  kg/m   Wt Readings from Last 3 Encounters:  11/23/19 150 lb 12.8 oz (68.4 kg)  08/15/19 151 lb (68.5 kg)  05/24/19 153 lb 9.6 oz (69.7 kg)    Physical Exam Vitals and nursing note reviewed.  Constitutional:      General: He is not in acute distress.    Appearance: He is well-developed. He is not diaphoretic.  Eyes:     General: No scleral icterus.    Conjunctiva/sclera: Conjunctivae normal.  Neck:     Thyroid: No thyromegaly.  Cardiovascular:     Rate and Rhythm: Normal rate and regular rhythm.     Heart sounds: Normal heart sounds. No murmur.  Pulmonary:     Effort: Pulmonary  effort is normal. No respiratory distress.     Breath sounds: Normal breath sounds. No wheezing.  Musculoskeletal:        General: Normal range of motion.     Cervical back: Neck supple.  Lymphadenopathy:     Cervical: No cervical adenopathy.  Skin:    General: Skin is warm and dry.     Findings: No rash.  Neurological:     Mental Status: He is alert and oriented to person, place, and time.     Coordination: Coordination normal.  Psychiatric:        Behavior: Behavior normal.       Assessment & Plan:   Problem List Items Addressed This Visit      Cardiovascular and Mediastinum   HTN (hypertension)   Relevant Medications   nebivolol (BYSTOLIC) 5 MG tablet   Other Relevant Orders   CBC with Differential/Platelet   CMP14+EGFR     Other   Hyperlipidemia   Relevant Medications   nebivolol (BYSTOLIC) 5 MG tablet   Other Relevant Orders   Lipid panel   Prediabetes - Primary   Relevant Orders   hgba1c      Continue Crestor and Bystolic and Advair, no changes for now, patient seems to be stable, will check blood work Follow up plan: Return in about 6 months (around 05/23/2020), or if symptoms worsen or fail to improve, for Hypertension and prediabetes and cholesterol.  Counseling provided for all of the vaccine components Orders Placed This Encounter  Procedures  . CBC with Differential/Platelet  . hgba1c  . CMP14+EGFR  . Lipid panel    Caryl Pina, MD [facility] 11/23/2019, 8:29 AM

## 2019-11-24 LAB — CBC WITH DIFFERENTIAL/PLATELET
Basophils Absolute: 0.1 10*3/uL (ref 0.0–0.2)
Basos: 1 %
EOS (ABSOLUTE): 0.3 10*3/uL (ref 0.0–0.4)
Eos: 3 %
Hematocrit: 49.1 % (ref 37.5–51.0)
Hemoglobin: 16.3 g/dL (ref 13.0–17.7)
Immature Grans (Abs): 0 10*3/uL (ref 0.0–0.1)
Immature Granulocytes: 0 %
Lymphocytes Absolute: 2.7 10*3/uL (ref 0.7–3.1)
Lymphs: 30 %
MCH: 29.5 pg (ref 26.6–33.0)
MCHC: 33.2 g/dL (ref 31.5–35.7)
MCV: 89 fL (ref 79–97)
Monocytes Absolute: 0.7 10*3/uL (ref 0.1–0.9)
Monocytes: 8 %
Neutrophils Absolute: 5.2 10*3/uL (ref 1.4–7.0)
Neutrophils: 58 %
Platelets: 155 10*3/uL (ref 150–450)
RBC: 5.53 x10E6/uL (ref 4.14–5.80)
RDW: 14 % (ref 11.6–15.4)
WBC: 9 10*3/uL (ref 3.4–10.8)

## 2019-11-24 LAB — CMP14+EGFR
ALT: 14 IU/L (ref 0–44)
AST: 15 IU/L (ref 0–40)
Albumin/Globulin Ratio: 1.9 (ref 1.2–2.2)
Albumin: 4.1 g/dL (ref 3.7–4.7)
Alkaline Phosphatase: 90 IU/L (ref 39–117)
BUN/Creatinine Ratio: 12 (ref 10–24)
BUN: 11 mg/dL (ref 8–27)
Bilirubin Total: 0.5 mg/dL (ref 0.0–1.2)
CO2: 23 mmol/L (ref 20–29)
Calcium: 8.7 mg/dL (ref 8.6–10.2)
Chloride: 103 mmol/L (ref 96–106)
Creatinine, Ser: 0.91 mg/dL (ref 0.76–1.27)
GFR calc Af Amer: 96 mL/min/{1.73_m2} (ref >59)
GFR calc non Af Amer: 83 mL/min/{1.73_m2} (ref >59)
Globulin, Total: 2.2 g/dL (ref 1.5–4.5)
Glucose: 87 mg/dL (ref 65–99)
Potassium: 4 mmol/L (ref 3.5–5.2)
Sodium: 140 mmol/L (ref 134–144)
Total Protein: 6.3 g/dL (ref 6.0–8.5)

## 2019-11-24 LAB — LIPID PANEL
Chol/HDL Ratio: 2.2 ratio (ref 0.0–5.0)
Cholesterol, Total: 93 mg/dL — ABNORMAL LOW (ref 100–199)
HDL: 43 mg/dL (ref >39)
LDL Chol Calc (NIH): 36 mg/dL (ref 0–99)
Triglycerides: 64 mg/dL (ref 0–149)
VLDL Cholesterol Cal: 14 mg/dL (ref 5–40)

## 2019-12-01 ENCOUNTER — Other Ambulatory Visit: Payer: Self-pay | Admitting: Family Medicine

## 2019-12-01 DIAGNOSIS — J411 Mucopurulent chronic bronchitis: Secondary | ICD-10-CM

## 2020-03-07 ENCOUNTER — Telehealth: Payer: Self-pay | Admitting: Family Medicine

## 2020-03-07 NOTE — Chronic Care Management (AMB) (Signed)
  Chronic Care Management   Note  03/07/2020 Name: Luis Salinas MRN: [id number] DOB: [birth year]  Luis Salinas is a 74 y.o. year old male who is a primary care patient of Dettinger, Fransisca Kaufmann, MD. I reached out to Laural Golden by phone today in response to a referral sent by Luis Salinas health plan.     Luis Salinas was given information about Chronic Care Management services today including:  1. CCM service includes personalized support from designated clinical staff supervised by his physician, including individualized plan of care and coordination with other care providers 2. 24/7 contact phone numbers for assistance for urgent and routine care needs. 3. Service will only be billed when office clinical staff spend 20 minutes or more in a month to coordinate care. 4. Only one practitioner may furnish and bill the service in a calendar month. 5. The patient may stop CCM services at any time (effective at the end of the month) by phone call to the office staff. 6. The patient will be responsible for cost sharing (co-pay) of up to 20% of the service fee (after annual deductible is met).  Patient agreed to services and verbal consent obtained.   Follow up plan: Telephone appointment with care management team member scheduled for:09/20/2020  Luis Salinas, [place], [place], [place] [address] Direct Dial: [phone] [email or web address] Website: [email or web address]

## 2020-05-23 ENCOUNTER — Ambulatory Visit (INDEPENDENT_AMBULATORY_CARE_PROVIDER_SITE_OTHER): Payer: Medicare Other | Admitting: Family Medicine

## 2020-05-23 ENCOUNTER — Encounter: Payer: Self-pay | Admitting: Family Medicine

## 2020-05-23 ENCOUNTER — Other Ambulatory Visit: Payer: Self-pay

## 2020-05-23 VITALS — BP 128/69 | HR 66 | Temp 97.1°F | Ht 67.0 in | Wt 150.4 lb

## 2020-05-23 DIAGNOSIS — J411 Mucopurulent chronic bronchitis: Secondary | ICD-10-CM

## 2020-05-23 DIAGNOSIS — R7303 Prediabetes: Secondary | ICD-10-CM

## 2020-05-23 DIAGNOSIS — E782 Mixed hyperlipidemia: Secondary | ICD-10-CM | POA: Diagnosis not present

## 2020-05-23 DIAGNOSIS — I10 Essential (primary) hypertension: Secondary | ICD-10-CM

## 2020-05-23 LAB — CBC WITH DIFFERENTIAL/PLATELET
Basophils Absolute: 0.1 10*3/uL (ref 0.0–0.2)
Basos: 1 %
EOS (ABSOLUTE): 0.3 10*3/uL (ref 0.0–0.4)
Eos: 3 %
Hematocrit: 49.5 % (ref 37.5–51.0)
Hemoglobin: 16.4 g/dL (ref 13.0–17.7)
Immature Grans (Abs): 0 10*3/uL (ref 0.0–0.1)
Immature Granulocytes: 0 %
Lymphocytes Absolute: 2.5 10*3/uL (ref 0.7–3.1)
Lymphs: 29 %
MCH: 29.5 pg (ref 26.6–33.0)
MCHC: 33.1 g/dL (ref 31.5–35.7)
MCV: 89 fL (ref 79–97)
Monocytes Absolute: 0.7 10*3/uL (ref 0.1–0.9)
Monocytes: 8 %
Neutrophils Absolute: 5.2 10*3/uL (ref 1.4–7.0)
Neutrophils: 59 %
Platelets: 166 10*3/uL (ref 150–450)
RBC: 5.56 x10E6/uL (ref 4.14–5.80)
RDW: 14 % (ref 11.6–15.4)
WBC: 8.9 10*3/uL (ref 3.4–10.8)

## 2020-05-23 LAB — CMP14+EGFR
ALT: 11 IU/L (ref 0–44)
AST: 14 IU/L (ref 0–40)
Albumin/Globulin Ratio: 1.8 (ref 1.2–2.2)
Albumin: 4.1 g/dL (ref 3.7–4.7)
Alkaline Phosphatase: 97 IU/L (ref 48–121)
BUN/Creatinine Ratio: 17 (ref 10–24)
BUN: 17 mg/dL (ref 8–27)
Bilirubin Total: 0.4 mg/dL (ref 0.0–1.2)
CO2: 25 mmol/L (ref 20–29)
Calcium: 9.2 mg/dL (ref 8.6–10.2)
Chloride: 104 mmol/L (ref 96–106)
Creatinine, Ser: 1.02 mg/dL (ref 0.76–1.27)
GFR calc Af Amer: 83 mL/min/{1.73_m2} (ref >59)
GFR calc non Af Amer: 72 mL/min/{1.73_m2} (ref >59)
Globulin, Total: 2.3 g/dL (ref 1.5–4.5)
Glucose: 98 mg/dL (ref 65–99)
Potassium: 4.3 mmol/L (ref 3.5–5.2)
Sodium: 140 mmol/L (ref 134–144)
Total Protein: 6.4 g/dL (ref 6.0–8.5)

## 2020-05-23 LAB — LIPID PANEL
Chol/HDL Ratio: 2.7 ratio (ref 0.0–5.0)
Cholesterol, Total: 95 mg/dL — ABNORMAL LOW (ref 100–199)
HDL: 35 mg/dL — ABNORMAL LOW (ref >39)
LDL Chol Calc (NIH): 39 mg/dL (ref 0–99)
Triglycerides: 115 mg/dL (ref 0–149)
VLDL Cholesterol Cal: 21 mg/dL (ref 5–40)

## 2020-05-23 LAB — BAYER DCA HB A1C WAIVED: HB A1C (BAYER DCA - WAIVED): 6.3 % (ref <7.0)

## 2020-05-23 NOTE — Progress Notes (Signed)
BP 128/69   Pulse 66   Temp (!) 97.1 F (36.2 C) (Temporal)   Ht 5' 7"  (1.702 m)   Wt 150 lb 6 oz (68.2 kg)   SpO2 96%   BMI 23.55 kg/m    Subjective:   [name], male    DOB: [birth year], 74 y.o.   MRN: [id number]  HPI: Luis Salinas is a 74 y.o. male presenting on 05/23/2020 for Medical Management of Chronic Issues   HPI Prediabetes Patient comes in today for recheck of his diabetes. Patient has been currently taking no medication we have been monitoring. Patient is not currently on an ACE inhibitor/ARB. Patient has not seen an ophthalmologist this year. Patient denies any issues with their feet. The symptom started onset as an adult hypertension hyperlipidemia ARE RELATED TO DM   Hypertension Patient is currently on Bystolic, and their blood pressure today is 128/69. Patient denies any lightheadedness or dizziness. Patient denies headaches, blurred vision, chest pains, shortness of breath, or weakness. Denies any side effects from medication and is content with current medication.   Hyperlipidemia Patient is coming in for recheck of his hyperlipidemia. The patient is currently taking Crestor. They deny any issues with myalgias or history of liver damage from it. They deny any focal numbness or weakness or chest pain.   COPD Patient is coming in for COPD recheck today.  He is currently on [place] and [place].  He has a mild chronic cough but denies any major coughing spells or wheezing spells.  He has 1nighttime symptoms per week and 4daytime symptoms per week currently.  He says that he is stable and happy where he is out, he continues to smoke.  Relevant past medical, surgical, family and social history reviewed and updated as indicated. Interim medical history since our last visit reviewed. Allergies and medications reviewed and updated.  Review of Systems  Constitutional: Negative for chills and fever.  Eyes: Negative for visual disturbance.  Respiratory:  Positive for cough. Negative for shortness of breath and wheezing.   Cardiovascular: Negative for chest pain and leg swelling.  Musculoskeletal: Negative for back pain and gait problem.  Skin: Negative for rash.  Neurological: Negative for dizziness, weakness and numbness.  All other systems reviewed and are negative.   Per HPI unless specifically indicated above   Allergies as of 05/23/2020   No Known Allergies     Medication List       Accurate as of May 23, 2020  8:18 AM. If you have any questions, ask your nurse or doctor.        Albuterol Sulfate 108 (90 Base) MCG/ACT Aepb Commonly known as: PROAIR RESPICLICK Inhale 2 puffs into the lungs every 6 (six) hours as needed (shortness of breath/ wheeze).   dipyridamole-aspirin 200-25 MG 12hr capsule Commonly known as: AGGRENOX Take 1 capsule by mouth 2 (two) times daily.   loratadine 10 MG tablet Commonly known as: CLARITIN Take 10 mg by mouth daily.   nebivolol 5 MG tablet Commonly known as: Bystolic Take 1 tablet (5 mg total) by mouth daily.   nitroGLYCERIN 0.4 MG SL tablet Commonly known as: NITROSTAT Place 1 tablet (0.4 mg total) under the tongue every 5 (five) minutes as needed for chest pain.   rosuvastatin 40 MG tablet Commonly known as: CRESTOR Take 1 tablet (40 mg total) by mouth daily.   Wixela Inhub 250-50 MCG/DOSE Aepb Generic drug: Fluticasone-Salmeterol INHALE 1 PUFF BY MOUTH EVERY 12 HOURS  Objective:   BP 128/69   Pulse 66   Temp (!) 97.1 F (36.2 C) (Temporal)   Ht 5' 7"  (1.702 m)   Wt 150 lb 6 oz (68.2 kg)   SpO2 96%   BMI 23.55 kg/m   Wt Readings from Last 3 Encounters:  05/23/20 150 lb 6 oz (68.2 kg)  11/23/19 150 lb 12.8 oz (68.4 kg)  08/15/19 151 lb (68.5 kg)    Physical Exam Vitals and nursing note reviewed.  Constitutional:      General: He is not in acute distress.    Appearance: He is well-developed. He is not diaphoretic.  Eyes:     General: No scleral  icterus.    Conjunctiva/sclera: Conjunctivae normal.  Neck:     Thyroid: No thyromegaly.  Cardiovascular:     Rate and Rhythm: Normal rate and regular rhythm.     Heart sounds: Normal heart sounds. No murmur heard.   Pulmonary:     Effort: Pulmonary effort is normal. No respiratory distress.     Breath sounds: Normal breath sounds. No wheezing.  Musculoskeletal:        General: Normal range of motion.     Cervical back: Neck supple.  Lymphadenopathy:     Cervical: No cervical adenopathy.  Skin:    General: Skin is warm and dry.     Findings: No rash.  Neurological:     Mental Status: He is alert and oriented to person, place, and time.     Coordination: Coordination normal.  Psychiatric:        Behavior: Behavior normal.       Assessment & Plan:   Problem List Items Addressed This Visit      Cardiovascular and Mediastinum   HTN (hypertension)   Relevant Orders   CBC with Differential/Platelet   CMP14+EGFR     Respiratory   COPD (chronic obstructive pulmonary disease) ([place])   Relevant Orders   CBC with Differential/Platelet     Other   Hyperlipidemia   Relevant Orders   Lipid panel   Prediabetes - Primary   Relevant Orders   Bayer DCA Hb A1c Waived      Continue current medications and monitor COPD Follow up plan: Return in about 6 months (around 11/22/2020), or if symptoms worsen or fail to improve, for htn and hld.  Counseling provided for all of the vaccine components Orders Placed This Encounter  Procedures  . CBC with Differential/Platelet  . CMP14+EGFR  . Lipid panel  . Bayer [facility] Hb A1c [place], MD [facility] 05/23/2020, 8:18 AM

## 2020-05-29 ENCOUNTER — Other Ambulatory Visit: Payer: Self-pay | Admitting: *Deleted

## 2020-05-29 DIAGNOSIS — E782 Mixed hyperlipidemia: Secondary | ICD-10-CM

## 2020-05-29 MED ORDER — ROSUVASTATIN CALCIUM 40 MG PO TABS
40.0000 mg | ORAL_TABLET | Freq: Every day | ORAL | 1 refills | Status: DC
Start: 2020-05-29 — End: 2020-09-26

## 2020-07-01 DIAGNOSIS — H524 Presbyopia: Secondary | ICD-10-CM | POA: Diagnosis not present

## 2020-09-20 ENCOUNTER — Ambulatory Visit: Payer: Medicare Other | Admitting: *Deleted

## 2020-09-20 DIAGNOSIS — J411 Mucopurulent chronic bronchitis: Secondary | ICD-10-CM

## 2020-09-20 DIAGNOSIS — E782 Mixed hyperlipidemia: Secondary | ICD-10-CM

## 2020-09-20 DIAGNOSIS — I10 Essential (primary) hypertension: Secondary | ICD-10-CM

## 2020-09-20 NOTE — Chronic Care Management (AMB) (Signed)
  Chronic Care Management   Initial Visit Note  09/20/2020 Name: BENSEN CHADDERDON MRN: [id number] DOB: 1946-05-09  Referred by: Dettinger, Elige Radon, MD Reason for referral : Chronic Care Management (Initial Visit)   GHASSAN COGGESHALL is a 74 y.o. year old male who is a primary care patient of Dettinger, Elige Radon, MD. The CCM team was consulted for assistance with chronic disease management and care coordination needs related to HTN, HLD, COPD and hx of MI  Review of patient status, including review of consultants reports, relevant laboratory and other test results, and collaboration with appropriate care team members and the patient's provider was performed as part of comprehensive patient evaluation and provision of chronic care management services.    SDOH (Social Determinants of Health) assessments performed: Yes See Care Plan activities for detailed interventions related to SDOH    I spoke with Mr Ashkar and his wife by telephone today regarding management of his chronic medical conditions and enrollment in the CCM program. Per patient, "I've been on the same medications for years and don't think I need any help". He does not have any CCM or resource needs at this time and would like to be removed from the program. He will reach out in the future if his situation changes.   Plan:  The patient has been provided with contact information for the care management team and has been advised to call with any health related questions or concerns.  CCM enrollment status changed to "previously enrolled" as per patient request on 09/20/2020 to discontinue enrollment. Case closed to case management services in primary care home.   Demetrios Loll, BSN, RN-BC Embedded Chronic Care Manager [facility] Care Management Direct Dial: [phone]

## 2020-09-20 NOTE — Patient Instructions (Signed)
Plan:  The patient has been provided with contact information for the care management team and has been advised to call with any health related questions or concerns.  CCM enrollment status changed to "previously enrolled" as per patient request on 09/20/2020 to discontinue enrollment. Case closed to case management services in primary care home.   Demetrios Loll, BSN, RN-BC Embedded Chronic Care Manager [facility] / [facility] Care Management Direct Dial: [phone]

## 2020-09-26 ENCOUNTER — Other Ambulatory Visit: Payer: Self-pay | Admitting: Family Medicine

## 2020-09-26 DIAGNOSIS — E782 Mixed hyperlipidemia: Secondary | ICD-10-CM

## 2020-11-22 ENCOUNTER — Encounter: Payer: Self-pay | Admitting: Family Medicine

## 2020-11-22 ENCOUNTER — Other Ambulatory Visit: Payer: Self-pay

## 2020-11-22 ENCOUNTER — Ambulatory Visit (INDEPENDENT_AMBULATORY_CARE_PROVIDER_SITE_OTHER): Payer: Medicare Other | Admitting: Family Medicine

## 2020-11-22 VITALS — BP 129/67 | Ht 67.0 in | Wt 147.0 lb

## 2020-11-22 DIAGNOSIS — I10 Essential (primary) hypertension: Secondary | ICD-10-CM | POA: Diagnosis not present

## 2020-11-22 DIAGNOSIS — J411 Mucopurulent chronic bronchitis: Secondary | ICD-10-CM | POA: Diagnosis not present

## 2020-11-22 DIAGNOSIS — Z1211 Encounter for screening for malignant neoplasm of colon: Secondary | ICD-10-CM

## 2020-11-22 DIAGNOSIS — R7303 Prediabetes: Secondary | ICD-10-CM | POA: Diagnosis not present

## 2020-11-22 DIAGNOSIS — E782 Mixed hyperlipidemia: Secondary | ICD-10-CM | POA: Diagnosis not present

## 2020-11-22 DIAGNOSIS — Z1159 Encounter for screening for other viral diseases: Secondary | ICD-10-CM | POA: Diagnosis not present

## 2020-11-22 LAB — BAYER DCA HB A1C WAIVED: HB A1C (BAYER DCA - WAIVED): 6.2 % (ref <7.0)

## 2020-11-22 MED ORDER — ROSUVASTATIN CALCIUM 40 MG PO TABS: 40.0000 mg | ORAL_TABLET | Freq: Every day | ORAL | 3 refills | Status: DC

## 2020-11-22 MED ORDER — ASPIRIN-DIPYRIDAMOLE ER 25-200 MG PO CP12: 1.0000 | ORAL_CAPSULE | Freq: Two times a day (BID) | ORAL | 3 refills | Status: DC

## 2020-11-22 MED ORDER — NEBIVOLOL HCL 5 MG PO TABS: 5.0000 mg | ORAL_TABLET | Freq: Every day | ORAL | 3 refills | Status: DC

## 2020-11-22 NOTE — Progress Notes (Signed)
BP 129/67   Ht _0  (1.702 m)   Wt 147 lb (66.7 kg)   BMI 23.02 kg/m    Subjective:   [name], male    DOB: Apr 08, 1946, 74 y.o.   MRN: [id number]  HPI: Luis Salinas is a 74 y.o. male presenting on 11/22/2020 for Medical Management of Chronic Issues, Hypertension, and Hyperlipidemia   HPI Hypertension Patient is currently on Bystolic, and their blood pressure today is 129/67. Patient denies any lightheadedness or dizziness. Patient denies headaches, blurred vision, chest pains, shortness of breath, or weakness. Denies any side effects from medication and is content with current medication.   COPD Patient is coming in for COPD recheck today.  He is currently on Wixela and albuterol, only uses them as needed.  He has a mild chronic cough but denies any major coughing spells or wheezing spells.  He has 0nighttime symptoms per week and 0daytime symptoms per week currently.  Patient is still smoking but says he is feeling good and not having any issues.  No desire to quit smoking.  Hyperlipidemia Patient is coming in for recheck of his hyperlipidemia. The patient is currently taking Crestor. They deny any issues with myalgias or history of liver damage from it. They deny any focal numbness or weakness or chest pain.   Relevant past medical, surgical, family and social history reviewed and updated as indicated. Interim medical history since our last visit reviewed. Allergies and medications reviewed and updated.  Review of Systems  Constitutional: Negative for chills and fever.  Eyes: Negative for discharge.  Respiratory: Negative for shortness of breath and wheezing.   Cardiovascular: Negative for chest pain and leg swelling.  Musculoskeletal: Negative for back pain and gait problem.  Skin: Negative for rash.  Neurological: Negative for dizziness, weakness and numbness.  All other systems reviewed and are negative.   Per HPI unless specifically indicated  above   Allergies as of 11/22/2020   No Known Allergies     Medication List       Accurate as of November 22, 2020  9:50 AM. If you have any questions, ask your nurse or doctor.        Albuterol Sulfate 108 (90 Base) MCG/ACT Aepb Commonly known as: PROAIR RESPICLICK Inhale 2 puffs into the lungs every 6 (six) hours as needed (shortness of breath/ wheeze).   dipyridamole-aspirin 200-25 MG 12hr capsule Commonly known as: AGGRENOX Take 1 capsule by mouth 2 (two) times daily.   loratadine 10 MG tablet Commonly known as: CLARITIN Take 10 mg by mouth daily.   nebivolol 5 MG tablet Commonly known as: Bystolic Take 1 tablet (5 mg total) by mouth daily.   nitroGLYCERIN 0.4 MG SL tablet Commonly known as: NITROSTAT Place 1 tablet (0.4 mg total) under the tongue every 5 (five) minutes as needed for chest pain.   rosuvastatin 40 MG tablet Commonly known as: CRESTOR Take 1 tablet (40 mg total) by mouth daily.   Wixela Inhub 250-50 MCG/DOSE Aepb Generic drug: Fluticasone-Salmeterol INHALE 1 PUFF BY MOUTH EVERY 12 HOURS        Objective:   BP 129/67   Ht _1  (1.702 m)   Wt 147 lb (66.7 kg)   BMI 23.02 kg/m   Wt Readings from Last 3 Encounters:  11/22/20 147 lb (66.7 kg)  05/23/20 150 lb 6 oz (68.2 kg)  11/23/19 150 lb 12.8 oz (68.4 kg)    Physical Exam Vitals and nursing note reviewed.  Constitutional:      General: He is not in acute distress.    Appearance: He is well-developed and well-nourished. He is not diaphoretic.  Eyes:     General: No scleral icterus.    Extraocular Movements: EOM normal.     Conjunctiva/sclera: Conjunctivae normal.  Neck:     Thyroid: No thyromegaly.  Cardiovascular:     Rate and Rhythm: Normal rate and regular rhythm.     Pulses: Intact distal pulses.     Heart sounds: Normal heart sounds. No murmur heard.   Pulmonary:     Effort: Pulmonary effort is normal. No respiratory distress.     Breath sounds: Normal breath sounds.  No wheezing.  Musculoskeletal:        General: No edema. Normal range of motion.     Cervical back: Neck supple.  Lymphadenopathy:     Cervical: No cervical adenopathy.  Skin:    General: Skin is warm and dry.     Findings: No rash.  Neurological:     Mental Status: He is alert and oriented to person, place, and time.     Coordination: Coordination normal.  Psychiatric:        Mood and Affect: Mood and affect normal.        Behavior: Behavior normal.       Assessment & Plan:   Problem List Items Addressed This Visit      Cardiovascular and Mediastinum   HTN (hypertension)   Relevant Medications   nebivolol (BYSTOLIC) 5 MG tablet   rosuvastatin (CRESTOR) 40 MG tablet   Other Relevant Orders   CBC with Differential/Platelet     Respiratory   COPD (chronic obstructive pulmonary disease) (HCC)   Relevant Orders   CBC with Differential/Platelet     Other   Hyperlipidemia   Relevant Medications   nebivolol (BYSTOLIC) 5 MG tablet   rosuvastatin (CRESTOR) 40 MG tablet   Other Relevant Orders   CBC with Differential/Platelet   Lipid panel   Prediabetes - Primary   Relevant Orders   CBC with Differential/Platelet   CMP14+EGFR   Bayer DCA Hb A1c Waived    Other Visit Diagnoses    Need for hepatitis C screening test       Relevant Orders   Hepatitis C antibody   Colon cancer screening       Relevant Orders   Cologuard      Continue current medication, no changes, will check blood work today. Follow up plan: Return in about 6 months (around 05/23/2021), or if symptoms worsen or fail to improve, for Prediabetes and hyperlipidemia and COPD.  Counseling provided for all of the vaccine components Orders Placed This Encounter  Procedures  . CBC with Differential/Platelet  . CMP14+EGFR  . Lipid panel  . Bayer DCA Hb A1c Waived  . Cologuard  . Hepatitis C antibody    Caryl Pina, MD [facility] 11/22/2020, 9:50 AM

## 2020-11-23 LAB — CBC WITH DIFFERENTIAL/PLATELET
Basophils Absolute: 0.1 10*3/uL (ref 0.0–0.2)
Basos: 1 %
EOS (ABSOLUTE): 0.4 10*3/uL (ref 0.0–0.4)
Eos: 5 %
Hematocrit: 50.1 % (ref 37.5–51.0)
Hemoglobin: 16.8 g/dL (ref 13.0–17.7)
Immature Grans (Abs): 0 10*3/uL (ref 0.0–0.1)
Immature Granulocytes: 0 %
Lymphocytes Absolute: 2.6 10*3/uL (ref 0.7–3.1)
Lymphs: 29 %
MCH: 29.3 pg (ref 26.6–33.0)
MCHC: 33.5 g/dL (ref 31.5–35.7)
MCV: 87 fL (ref 79–97)
Monocytes Absolute: 0.7 10*3/uL (ref 0.1–0.9)
Monocytes: 8 %
Neutrophils Absolute: 5.4 10*3/uL (ref 1.4–7.0)
Neutrophils: 57 %
Platelets: 181 10*3/uL (ref 150–450)
RBC: 5.74 x10E6/uL (ref 4.14–5.80)
RDW: 13.6 % (ref 11.6–15.4)
WBC: 9.2 10*3/uL (ref 3.4–10.8)

## 2020-11-23 LAB — CMP14+EGFR
ALT: 19 IU/L (ref 0–44)
AST: 18 IU/L (ref 0–40)
Albumin/Globulin Ratio: 1.5 (ref 1.2–2.2)
Albumin: 4.2 g/dL (ref 3.7–4.7)
Alkaline Phosphatase: 106 IU/L (ref 44–121)
BUN/Creatinine Ratio: 10 (ref 10–24)
BUN: 10 mg/dL (ref 8–27)
Bilirubin Total: 0.4 mg/dL (ref 0.0–1.2)
CO2: 25 mmol/L (ref 20–29)
Calcium: 9 mg/dL (ref 8.6–10.2)
Chloride: 100 mmol/L (ref 96–106)
Creatinine, Ser: 1 mg/dL (ref 0.76–1.27)
GFR calc Af Amer: 85 mL/min/{1.73_m2} (ref >59)
GFR calc non Af Amer: 73 mL/min/{1.73_m2} (ref >59)
Globulin, Total: 2.8 g/dL (ref 1.5–4.5)
Glucose: 123 mg/dL — ABNORMAL HIGH (ref 65–99)
Potassium: 4.1 mmol/L (ref 3.5–5.2)
Sodium: 141 mmol/L (ref 134–144)
Total Protein: 7 g/dL (ref 6.0–8.5)

## 2020-11-23 LAB — LIPID PANEL
Chol/HDL Ratio: 2.5 ratio (ref 0.0–5.0)
Cholesterol, Total: 112 mg/dL (ref 100–199)
HDL: 44 mg/dL (ref >39)
LDL Chol Calc (NIH): 48 mg/dL (ref 0–99)
Triglycerides: 108 mg/dL (ref 0–149)
VLDL Cholesterol Cal: 20 mg/dL (ref 5–40)

## 2020-11-23 LAB — HEPATITIS C ANTIBODY: Hep C Virus Ab: <0.1 s/co ratio (ref 0.0–0.9)

## 2020-11-25 ENCOUNTER — Other Ambulatory Visit: Payer: Self-pay

## 2020-11-25 MED ORDER — NITROGLYCERIN 0.4 MG SL SUBL: 0.4000 mg | SUBLINGUAL_TABLET | SUBLINGUAL | 3 refills | Status: DC | PRN

## 2021-01-02 DIAGNOSIS — H353131 Nonexudative age-related macular degeneration, bilateral, early dry stage: Secondary | ICD-10-CM | POA: Diagnosis not present

## 2021-04-01 ENCOUNTER — Telehealth: Payer: Self-pay

## 2021-04-01 ENCOUNTER — Other Ambulatory Visit: Payer: Self-pay

## 2021-04-01 DIAGNOSIS — J411 Mucopurulent chronic bronchitis: Secondary | ICD-10-CM

## 2021-04-01 MED ORDER — FLUTICASONE-SALMETEROL 250-50 MCG/DOSE IN AEPB: INHALATION_SPRAY | RESPIRATORY_TRACT | 3 refills | Status: DC

## 2021-04-01 NOTE — Telephone Encounter (Signed)
Pt was last seen Dec 2021. Dr. Louanne Skye wanted pt to continue Wixela. Pts current prescription is out of date. Last sent in 2020. We do not have any samples of Wixela. Refills sent to [facility]. Pt has follow up in June. Pt's wife made aware.

## 2021-05-23 ENCOUNTER — Encounter: Payer: Self-pay | Admitting: Family Medicine

## 2021-05-23 ENCOUNTER — Ambulatory Visit (INDEPENDENT_AMBULATORY_CARE_PROVIDER_SITE_OTHER): Payer: Medicare Other | Admitting: Family Medicine

## 2021-05-23 ENCOUNTER — Other Ambulatory Visit: Payer: Self-pay

## 2021-05-23 VITALS — BP 131/61 | HR 56 | Ht 67.0 in | Wt 144.0 lb

## 2021-05-23 DIAGNOSIS — J411 Mucopurulent chronic bronchitis: Secondary | ICD-10-CM | POA: Diagnosis not present

## 2021-05-23 DIAGNOSIS — I10 Essential (primary) hypertension: Secondary | ICD-10-CM

## 2021-05-23 DIAGNOSIS — R7303 Prediabetes: Secondary | ICD-10-CM | POA: Diagnosis not present

## 2021-05-23 DIAGNOSIS — E782 Mixed hyperlipidemia: Secondary | ICD-10-CM

## 2021-05-23 LAB — CMP14+EGFR
ALT: 14 IU/L (ref 0–44)
AST: 13 IU/L (ref 0–40)
Albumin/Globulin Ratio: 2 (ref 1.2–2.2)
Albumin: 4.5 g/dL (ref 3.7–4.7)
Alkaline Phosphatase: 105 IU/L (ref 44–121)
BUN/Creatinine Ratio: 12 (ref 10–24)
BUN: 11 mg/dL (ref 8–27)
Bilirubin Total: 0.4 mg/dL (ref 0.0–1.2)
CO2: 23 mmol/L (ref 20–29)
Calcium: 9.2 mg/dL (ref 8.6–10.2)
Chloride: 101 mmol/L (ref 96–106)
Creatinine, Ser: 0.92 mg/dL (ref 0.76–1.27)
Globulin, Total: 2.3 g/dL (ref 1.5–4.5)
Glucose: 100 mg/dL — ABNORMAL HIGH (ref 65–99)
Potassium: 4.2 mmol/L (ref 3.5–5.2)
Sodium: 138 mmol/L (ref 134–144)
Total Protein: 6.8 g/dL (ref 6.0–8.5)
eGFR: 87 mL/min/{1.73_m2} (ref >59)

## 2021-05-23 LAB — CBC WITH DIFFERENTIAL/PLATELET
Basophils Absolute: 0.1 10*3/uL (ref 0.0–0.2)
Basos: 1 %
EOS (ABSOLUTE): 0.3 10*3/uL (ref 0.0–0.4)
Eos: 3 %
Hematocrit: 51 % (ref 37.5–51.0)
Hemoglobin: 17.2 g/dL (ref 13.0–17.7)
Immature Grans (Abs): 0 10*3/uL (ref 0.0–0.1)
Immature Granulocytes: 0 %
Lymphocytes Absolute: 2.5 10*3/uL (ref 0.7–3.1)
Lymphs: 27 %
MCH: 29.6 pg (ref 26.6–33.0)
MCHC: 33.7 g/dL (ref 31.5–35.7)
MCV: 88 fL (ref 79–97)
Monocytes Absolute: 0.7 10*3/uL (ref 0.1–0.9)
Monocytes: 8 %
Neutrophils Absolute: 5.6 10*3/uL (ref 1.4–7.0)
Neutrophils: 61 %
Platelets: 177 10*3/uL (ref 150–450)
RBC: 5.81 x10E6/uL — ABNORMAL HIGH (ref 4.14–5.80)
RDW: 13.6 % (ref 11.6–15.4)
WBC: 9.3 10*3/uL (ref 3.4–10.8)

## 2021-05-23 LAB — LIPID PANEL
Chol/HDL Ratio: 2.7 ratio (ref 0.0–5.0)
Cholesterol, Total: 104 mg/dL (ref 100–199)
HDL: 39 mg/dL — ABNORMAL LOW (ref >39)
LDL Chol Calc (NIH): 46 mg/dL (ref 0–99)
Triglycerides: 98 mg/dL (ref 0–149)
VLDL Cholesterol Cal: 19 mg/dL (ref 5–40)

## 2021-05-23 LAB — BAYER DCA HB A1C WAIVED: HB A1C (BAYER DCA - WAIVED): 6 % (ref <7.0)

## 2021-05-23 NOTE — Progress Notes (Signed)
BP (!) 153/67   Pulse (!) 56   Ht 5' 7"  (1.702 m)   Wt 144 lb (65.3 kg)   SpO2 97%   BMI 22.55 kg/m    Subjective:   [name], male    DOB: 1946/01/18, 75 y.o.   MRN: [id number]  HPI: Luis Salinas is a 75 y.o. male presenting on 05/23/2021 for Medical Management of Chronic Issues, Hypertension, and Hyperlipidemia   HPI Prediabetes Patient comes in today for recheck of his diabetes. Patient has been currently taking no medication currently. Patient is not currently on an ACE inhibitor/ARB. Patient has not seen an ophthalmologist this year. Patient denies any issues with their feet. The symptom started onset as an adult hypertension and hyperlipidemia ARE RELATED TO DM   Hypertension Patient is currently on Bystolic, and their blood pressure today is 131/61 on repeat. Patient denies any lightheadedness or dizziness. Patient denies headaches, blurred vision, chest pains, shortness of breath, or weakness. Denies any side effects from medication and is content with current medication.   Hyperlipidemia Patient is coming in for recheck of his hyperlipidemia. The patient is currently taking Crestor. They deny any issues with myalgias or history of liver damage from it. They deny any focal numbness or weakness or chest pain.   COPD Patient is coming in for COPD recheck today.  He is currently on Advair.  He has a mild chronic cough but denies any major coughing spells or wheezing spells.  He has 0 nighttime symptoms per week and 2 daytime symptoms per week currently.  He has occasional cough but he says his breathing has been doing fine, he says really the biggest thing that bothers him sometimes is his legs get fatigued early.  He says its been the same and does not want to change her doing about it at this point.  Relevant past medical, surgical, family and social history reviewed and updated as indicated. Interim medical history since our last visit reviewed. Allergies  and medications reviewed and updated.  Review of Systems  Constitutional:  Negative for chills and fever.  Eyes:  Negative for visual disturbance.  Respiratory:  Positive for cough and wheezing. Negative for shortness of breath.   Cardiovascular:  Negative for chest pain and leg swelling.  Musculoskeletal:  Negative for back pain and gait problem.  Skin:  Negative for rash.  Neurological:  Negative for dizziness, weakness and light-headedness.  All other systems reviewed and are negative.  Per HPI unless specifically indicated above   Allergies as of 05/23/2021   No Known Allergies      Medication List        Accurate as of May 23, 2021  8:01 AM. If you have any questions, ask your nurse or doctor.          Albuterol Sulfate 108 (90 Base) MCG/ACT Aepb Commonly known as: PROAIR RESPICLICK Inhale 2 puffs into the lungs every 6 (six) hours as needed (shortness of breath/ wheeze).   dipyridamole-aspirin 200-25 MG 12hr capsule Commonly known as: AGGRENOX Take 1 capsule by mouth 2 (two) times daily.   Fluticasone-Salmeterol 250-50 MCG/DOSE Aepb Commonly known as: Wixela Inhub INHALE 1 PUFF BY MOUTH EVERY 12 HOURS   loratadine 10 MG tablet Commonly known as: CLARITIN Take 10 mg by mouth daily.   nebivolol 5 MG tablet Commonly known as: Bystolic Take 1 tablet (5 mg total) by mouth daily.   nitroGLYCERIN 0.4 MG SL tablet Commonly known as: NITROSTAT Place  1 tablet (0.4 mg total) under the tongue every 5 (five) minutes as needed for chest pain.   rosuvastatin 40 MG tablet Commonly known as: CRESTOR Take 1 tablet (40 mg total) by mouth daily.         Objective:   BP (!) 153/67   Pulse (!) 56   Ht 5' 7"  (1.702 m)   Wt 144 lb (65.3 kg)   SpO2 97%   BMI 22.55 kg/m   Wt Readings from Last 3 Encounters:  05/23/21 144 lb (65.3 kg)  11/22/20 147 lb (66.7 kg)  05/23/20 150 lb 6 oz (68.2 kg)    Physical Exam Vitals and nursing note reviewed.  Constitutional:       General: He is not in acute distress.    Appearance: He is well-developed. He is not diaphoretic.  Eyes:     General: No scleral icterus.    Conjunctiva/sclera: Conjunctivae normal.  Neck:     Thyroid: No thyromegaly.  Cardiovascular:     Rate and Rhythm: Normal rate and regular rhythm.     Heart sounds: Normal heart sounds. No murmur heard. Pulmonary:     Effort: Pulmonary effort is normal. No respiratory distress.     Breath sounds: Normal breath sounds. No wheezing, rhonchi or rales.  Musculoskeletal:        General: No swelling. Normal range of motion.     Cervical back: Neck supple.  Lymphadenopathy:     Cervical: No cervical adenopathy.  Skin:    General: Skin is warm and dry.     Findings: No rash.  Neurological:     Mental Status: He is alert and oriented to person, place, and time.     Coordination: Coordination normal.  Psychiatric:        Behavior: Behavior normal.      Assessment & Plan:   Problem List Items Addressed This Visit       Cardiovascular and Mediastinum   HTN (hypertension)   Relevant Orders   CBC with Differential/Platelet   CMP14+EGFR     Respiratory   COPD (chronic obstructive pulmonary disease) ([place])   Relevant Orders   CBC with Differential/Platelet   CMP14+EGFR     Other   Hyperlipidemia - Primary   Relevant Orders   Lipid panel   Prediabetes   Relevant Orders   Bayer DCA Hb A1c Waived    Continue current medication, will do blood work today.  Patient feels stable so we will not change medicines Follow up plan: Return in about 6 months (around 11/22/2021), or if symptoms worsen or fail to improve, for Prediabetes and htn and hld.  Counseling provided for all of the vaccine components No orders of the defined types were placed in this encounter.   Caryl Pina, MD [facility] 05/23/2021, 8:01 AM

## 2021-06-04 ENCOUNTER — Ambulatory Visit (INDEPENDENT_AMBULATORY_CARE_PROVIDER_SITE_OTHER): Payer: Medicare Other

## 2021-06-04 VITALS — Ht 67.0 in | Wt 144.0 lb

## 2021-06-04 DIAGNOSIS — Z0000 Encounter for general adult medical examination without abnormal findings: Secondary | ICD-10-CM | POA: Diagnosis not present

## 2021-06-04 NOTE — Progress Notes (Signed)
Subjective:   Luis Salinas is a 75 y.o. male who presents for Medicare Annual/Subsequent preventive examination.  Virtual Visit via Telephone Note  I connected with  Luis Salinas on 06/04/21 at  9:00 AM EDT by telephone and verified that I am speaking with the correct person using two identifiers.  Location: Patient: Home Provider: [facility] Persons participating in the virtual visit: patient/Nurse Health Advisor   I discussed the limitations, risks, security and privacy concerns of performing an evaluation and management service by telephone and the availability of in person appointments. The patient expressed understanding and agreed to proceed.  Interactive audio and video telecommunications were attempted between this nurse and patient, however failed, due to patient having technical difficulties OR patient did not have access to video capability.  We continued and completed visit with audio only.  Some vital signs may be absent or patient reported.   Darrien Laakso E Doctor Sheahan, LPN   Review of Systems     Cardiac Risk Factors include: advanced age (>755men, 60>65 women);male gender;smoking/ tobacco exposure;sedentary lifestyle;dyslipidemia;Other (see comment), Risk factor comments: pre-diabetes, hx of stroke     Objective:    Today's Vitals   06/04/21 0908  Weight: 144 lb (65.3 kg)  Height: 5\' 7"  (1.702 m)  PainSc: 5    Body mass index is 22.55 kg/m.  Advanced Directives 06/04/2021 06/26/2019 03/30/2018 10/20/2016 05/17/2015 04/02/2013  Does Patient Have a Medical Advance Directive? Yes No No No Yes Patient does not have advance directive;Patient would like information  Type of Advance Directive Healthcare Power of [place];Living will - - - Living will -  Does patient want to make changes to medical advance directive? - - - - No - Patient declined -  Copy of Healthcare Power of Attorney in Chart? No - copy requested - - - No - copy requested -  Would patient like information on creating a  medical advance directive? - No - Patient declined No - Patient declined Yes - Educational materials given No - patient declined information Referral made to social work  Pre-existing out of facility DNR order (yellow form or pink MOST form) - - - - - No    Current Medications (verified) Outpatient Encounter Medications as of 06/04/2021  Medication Sig   Albuterol Sulfate 108 (90 Base) MCG/ACT AEPB Inhale 2 puffs into the lungs every 6 (six) hours as needed (shortness of breath/ wheeze).   dipyridamole-aspirin (AGGRENOX) 200-25 MG 12hr capsule Take 1 capsule by mouth 2 (two) times daily.   Fluticasone-Salmeterol (WIXELA INHUB) 250-50 MCG/DOSE AEPB INHALE 1 PUFF BY MOUTH EVERY 12 HOURS   loratadine (CLARITIN) 10 MG tablet Take 10 mg by mouth daily.   nebivolol (BYSTOLIC) 5 MG tablet Take 1 tablet (5 mg total) by mouth daily.   nitroGLYCERIN (NITROSTAT) 0.4 MG SL tablet Place 1 tablet (0.4 mg total) under the tongue every 5 (five) minutes as needed for chest pain.   rosuvastatin (CRESTOR) 40 MG tablet Take 1 tablet (40 mg total) by mouth daily.   No facility-administered encounter medications on file as of 06/04/2021.    Allergies (verified) Patient has no known allergies.   History: Past Medical History:  Diagnosis Date   Amnesia    He was found and reuintied with his family in [id number] after 27 years.   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 His family found him after 27 years   Arthritis    Cataract    Concussion  hx of   COPD (chronic obstructive pulmonary disease) (HCC)    Myocardial infarction (HCC)    2007 [facility]   Pneumonia    exposure to caustic chemicals   Stroke [facility])    multiple strokes   Past Surgical History:  Procedure Laterality Date   ANTERIOR CERVICAL DECOMP/DISCECTOMY FUSION  05/16/2012   Procedure: ANTERIOR CERVICAL DECOMPRESSION/DISCECTOMY FUSION 2 LEVELS;  Surgeon: Barnett Abu, MD;  Location: [facility];  Service: Neurosurgery;   Laterality: N/A;  Cervical Five-Six, Cervical Six-Seven Anterior Cervical Decompression/Diskectomy Fusion   CARDIAC CATHETERIZATION     COLONOSCOPY W/ POLYPECTOMY     CORONARY ANGIOPLASTY     EYE SURGERY     cataracts   JOINT REPLACEMENT     Left knee replacement   JOINT REPLACEMENT     left elbow at age 44   ROTATOR CUFF REPAIR     left   TONSILLECTOMY     Family History  Problem Relation Age of Onset   Heart disease Mother    COPD Father    Aneurysm Father    Anesthesia problems Neg Hx    Hypotension Neg Hx    Malignant hyperthermia Neg Hx    Pseudochol deficiency Neg Hx    Social History   Socioeconomic History   Marital status: Married    Spouse name: Zella Ball   Number of children: 7   Years of education: college   Highest education level: Some college, no degree  Occupational History   Occupation: Retired    Comment: Proofreader  Tobacco Use   Smoking status: Light Smoker    Pack years: 0.00    Types: Cigars    Last attempt to quit: 04/03/2013    Years since quitting: 8.1   Smokeless tobacco: Never   Tobacco comments:    One cigar per day.  Smoked 40 years cigarettes.  10 years ago quit cigarettes.  Vaping Use   Vaping Use: Never used  Substance and Sexual Activity   Alcohol use: No    Alcohol/week: 0.0 standard drinks   Drug use: No    Types: Marijuana   Sexual activity: Yes  Other Topics Concern   Not on file  Social History Narrative   Lives with his fourth wife.    Social Determinants of Health   Financial Resource Strain: Low Risk    Difficulty of Paying Living Expenses: Not hard at all  Food Insecurity: No Food Insecurity   Worried About Programme researcher, broadcasting/film/video in the Last Year: Never true   Ran Out of Food in the Last Year: Never true  Transportation Needs: No Transportation Needs   Lack of Transportation (Medical): No   Lack of Transportation (Non-Medical): No  Physical Activity: Insufficiently Active   Days of Exercise per Week: 7 days   Minutes  of Exercise per Session: 10 min  Stress: No Stress Concern Present   Feeling of Stress : Not at all  Social Connections: Socially Integrated   Frequency of Communication with Friends and Family: More than three times a week   Frequency of Social Gatherings with Friends and Family: More than three times a week   Attends Religious Services: 1 to 4 times per year   Active Member of [employer] or Organizations: Yes   Attends Banker Meetings: 1 to 4 times per year   Marital Status: Married    Tobacco Counseling Ready to quit: Not Answered Counseling given: Not Answered Tobacco comments: One cigar per day.  Smoked  40 years cigarettes.  10 years ago quit cigarettes.   Clinical Intake:  Pre-visit preparation completed: Yes  Pain : 0-10 Pain Score: 5  Pain Type: Chronic pain Pain Location: Leg Pain Orientation: Right, Left Pain Descriptors / Indicators: Aching, Dull Pain Onset: More than a month ago Pain Frequency: Intermittent     BMI - recorded: 22.55 Nutritional Status: BMI of 19-24  Normal Nutritional Risks: None Diabetes: No  How often do you need to have someone help you when you read instructions, pamphlets, or other written materials from your doctor or pharmacy?: 1 - Never  Diabetic? No  Interpreter Needed?: No  Information entered by :: Kielee Care, LPN   Activities of Daily Living In your present state of health, do you have any difficulty performing the following activities: 06/04/2021  Hearing? N  Vision? N  Difficulty concentrating or making decisions? Y  Walking or climbing stairs? Y  Dressing or bathing? N  Doing errands, shopping? N  Preparing Food and eating ? N  Using the Toilet? N  In the past six months, have you accidently leaked urine? N  Do you have problems with loss of bowel control? N  Managing your Medications? N  Managing your Finances? N  Housekeeping or managing your Housekeeping? N  Some recent data might be hidden     Patient Care Team: Dettinger, Elige Radon, MD as PCP - General (Family Medicine) Micki Riley, MD as Consulting Physician (Neurology) Iva Boop, MD as Consulting Physician (Gastroenterology)  Indicate any recent Medical Services you may have received from other than [facility] providers in the past year (date may be approximate).     Assessment:   This is a routine wellness examination for Tripp.  Hearing/Vision screen Hearing Screening - Comments:: Denies hearing difficulties  Vision Screening - Comments:: Wears eyeglasses - up to date with annual eye exams with Dr Earlene Plater in [place]  Dietary issues and exercise activities discussed: Current Exercise Habits: Home exercise routine, Type of exercise: walking, Time (Minutes): 10, Frequency (Times/Week): 7, Weekly Exercise (Minutes/Week): 70, Intensity: Mild, Exercise limited by: respiratory conditions(s);orthopedic condition(s)   Goals Addressed             This Visit's Progress    DIET - INCREASE WATER INTAKE   On track    Try to drink 6-8 glasses of water daily.      Exercise 3x per week (30 min per time)   Not on track    Walk for 30 minutes 3 times a week at a reasonable pace        Depression Screen PHQ 2/9 Scores 06/04/2021 05/23/2021 11/22/2020 05/23/2020 11/23/2019 08/15/2019 06/26/2019  PHQ - 2 Score 0 0 0 0 0 0 0    Fall Risk Fall Risk  06/04/2021 05/23/2021 11/22/2020 05/23/2020 11/23/2019  Falls in the past year? 0 0 0 0 0  Number falls in past yr: 0 - - - -  Injury with Fall? 0 - - - -  Risk for fall due to : Impaired vision;Orthopedic patient - - - -  Follow up Falls prevention discussed - - - -    FALL RISK PREVENTION PERTAINING TO THE HOME:  Any stairs in or around the home? No  If so, are there any without handrails? No  Home free of loose throw rugs in walkways, pet beds, electrical cords, etc? Yes  Adequate lighting in your home to reduce risk of falls? Yes   ASSISTIVE DEVICES UTILIZED TO PREVENT  FALLS:  Life alert? No  Use of a cane, walker or w/c? No  Grab bars in the bathroom? No  Shower chair or bench in shower? No  Elevated toilet seat or a handicapped toilet? No   TIMED UP AND GO:  Was the test performed? No . Telephonic visit.  Cognitive Function: MMSE - Mini Mental State Exam 03/30/2018 10/20/2016 05/17/2015  Orientation to time 4 5 5   Orientation to Place 5 5 5   Registration 3 3 3   Attention/ Calculation 5 3 5   Recall 2 3 3   Language- name 2 objects 2 2 2   Language- repeat 1 1 1   Language- follow 3 step command 3 3 3   Language- read & follow direction 1 1 1   Write a sentence 1 1 1   Copy design 1 1 1   Total score 28 28 30      6CIT Screen 06/04/2021 06/26/2019  What Year? 0 points 0 points  What month? 0 points 0 points  What time? 0 points 0 points  Count back from 20 0 points 0 points  Months in reverse 2 points 2 points  Repeat phrase 4 points 4 points  Total Score 6 6    Immunizations Immunization History  Administered Date(s) Administered   Pneumococcal Conjugate-13 03/29/2015   Pneumococcal Polysaccharide-23 10/14/2002, 12/27/2013   Td 12/14/2008, 12/27/2013    TDAP status: Up to date  Flu Vaccine status: Declined, Education has been provided regarding the importance of this vaccine but patient still declined. Advised may receive this vaccine at local pharmacy or Health Dept. Aware to provide a copy of the vaccination record if obtained from local pharmacy or Health Dept. Verbalized acceptance and understanding.  Pneumococcal vaccine status: Up to date  Covid-19 vaccine status: Declined, Education has been provided regarding the importance of this vaccine but patient still declined. Advised may receive this vaccine at local pharmacy or Health Dept.or vaccine clinic. Aware to provide a copy of the vaccination record if obtained from local pharmacy or Health Dept. Verbalized acceptance and understanding.  Qualifies for Shingles Vaccine? Yes    Zostavax completed No   Shingrix Completed?: No.    Education has been provided regarding the importance of this vaccine. Patient has been advised to call insurance company to determine out of pocket expense if they have not yet received this vaccine. Advised may also receive vaccine at local pharmacy or Health Dept. Verbalized acceptance and understanding.  Screening Tests Health Maintenance  Topic Date Due   Zoster Vaccines- Shingrix (1 of 2) 11/24/2021 (Originally 11/13/1995)   COVID-19 Vaccine (1) 05/25/2022 (Originally 11/12/1951)   COLONOSCOPY (Pts 45-15yrs Insurance coverage will need to be confirmed)  11/24/2024 (Originally 12/28/2019)   TETANUS/TDAP  12/28/2023   Hepatitis C Screening  Completed   PNA vac Low Risk Adult  Completed   HPV VACCINES  Aged Out   INFLUENZA VACCINE  Discontinued    Health Maintenance  There are no preventive care reminders to display for this patient.  Colorectal cancer screening: No longer required.   Lung Cancer Screening: (Low Dose CT Chest recommended if Age 26-80 years, 30 pack-year currently smoking OR have quit w/in 15years.) does not qualify.   Additional Screening:  Hepatitis C Screening: does qualify; Completed 11/22/2020  Vision Screening: Recommended annual ophthalmology exams for early detection of glaucoma and other disorders of the eye. Is the patient up to date with their annual eye exam?  Yes  Who is the provider or what is the name of the office in which  the patient attends annual eye exams? [facility] in [place] If pt is not established with a provider, would they like to be referred to a provider to establish care? No .   Dental Screening: Recommended annual dental exams for proper oral hygiene  Community Resource Referral / Chronic Care Management: CRR required this visit?  No   CCM required this visit?  No      Plan:     I have personally reviewed and noted the following in the patient's chart:   Medical and social  history Use of alcohol, tobacco or illicit drugs  Current medications and supplements including opioid prescriptions. Patient is not currently taking opioid prescriptions. Functional ability and status Nutritional status Physical activity Advanced directives List of other physicians Hospitalizations, surgeries, and ER visits in previous 12 months Vitals Screenings to include cognitive, depression, and falls Referrals and appointments  In addition, I have reviewed and discussed with patient certain preventive protocols, quality metrics, and best practice recommendations. A written personalized care plan for preventive services as well as general preventive health recommendations were provided to patient.     [place] Constable, LPN   [id number]   Nurse Notes: None

## 2021-06-04 NOTE — Patient Instructions (Signed)
Luis Salinas , Thank you for taking time to come for your Medicare Wellness Visit. I appreciate your ongoing commitment to your health goals. Please review the following plan we discussed and let me know if I can assist you in the future.   Screening recommendations/referrals: Colonoscopy: No longer required Recommended yearly ophthalmology/optometry visit for glaucoma screening and checkup Recommended yearly dental visit for hygiene and checkup  Vaccinations: Influenza vaccine: Declined Pneumococcal vaccine: Done 12/27/2013 & 03/29/2015 Tdap vaccine: Done 12/27/2013 - Repeat in 10 years Shingles vaccine: Due Shingrix discussed. Please contact your pharmacy for coverage information.  Covid-19: Declined   Advanced directives: Please bring a copy of your health care power of attorney and living will to the office to be added to your chart at your convenience.  Conditions/risks identified: Aim for 30 minutes of exercise each day, drink 6-8 glasses of water and eat lots of fruits and vegetables.  Next appointment: Follow up in one year for your annual wellness visit.   Preventive Care 35 Years and Older, Male  Preventive care refers to lifestyle choices and visits with your health care provider that can promote health and wellness. What does preventive care include? A yearly physical exam. This is also called an annual well check. Dental exams once or twice a year. Routine eye exams. Ask your health care provider how often you should have your eyes checked. Personal lifestyle choices, including: Daily care of your teeth and gums. Regular physical activity. Eating a healthy diet. Avoiding tobacco and drug use. Limiting alcohol use. Practicing safe sex. Taking low doses of aspirin every day. Taking vitamin and mineral supplements as recommended by your health care provider. What happens during an annual well check? The services and screenings done by your health care provider during your  annual well check will depend on your age, overall health, lifestyle risk factors, and family history of disease. Counseling  Your health care provider may ask you questions about your: Alcohol use. Tobacco use. Drug use. Emotional well-being. Home and relationship well-being. Sexual activity. Eating habits. History of falls. Memory and ability to understand (cognition). Work and work Astronomer. Screening  You may have the following tests or measurements: Height, weight, and BMI. Blood pressure. Lipid and cholesterol levels. These may be checked every 5 years, or more frequently if you are over 25 years old. Skin check. Lung cancer screening. You may have this screening every year starting at age 95 if you have a 30-pack-year history of smoking and currently smoke or have quit within the past 15 years. Fecal occult blood test (FOBT) of the stool. You may have this test every year starting at age 80. Flexible sigmoidoscopy or colonoscopy. You may have a sigmoidoscopy every 5 years or a colonoscopy every 10 years starting at age 84. Prostate cancer screening. Recommendations will vary depending on your family history and other risks. Hepatitis C blood test. Hepatitis B blood test. Sexually transmitted disease (STD) testing. Diabetes screening. This is done by checking your blood sugar (glucose) after you have not eaten for a while (fasting). You may have this done every 1-3 years. Abdominal aortic aneurysm (AAA) screening. You may need this if you are a current or former smoker. Osteoporosis. You may be screened starting at age 80 if you are at high risk. Talk with your health care provider about your test results, treatment options, and if necessary, the need for more tests. Vaccines  Your health care provider may recommend certain vaccines, such as: Influenza vaccine. This  is recommended every year. Tetanus, diphtheria, and acellular pertussis (Tdap, Td) vaccine. You may need a Td  booster every 10 years. Zoster vaccine. You may need this after age 50. Pneumococcal 13-valent conjugate (PCV13) vaccine. One dose is recommended after age 47. Pneumococcal polysaccharide (PPSV23) vaccine. One dose is recommended after age 65. Talk to your health care provider about which screenings and vaccines you need and how often you need them. This information is not intended to replace advice given to you by your health care provider. Make sure you discuss any questions you have with your health care provider. Document Released: 12/27/2015 Document Revised: 08/19/2016 Document Reviewed: 10/01/2015 Elsevier Interactive Patient Education  2017 [place] Prevention in the Home Falls can cause injuries. They can happen to people of all ages. There are many things you can do to make your home safe and to help prevent falls. What can I do on the outside of my home? Regularly fix the edges of walkways and driveways and fix any cracks. Remove anything that might make you trip as you walk through a door, such as a raised step or threshold. Trim any bushes or trees on the path to your home. Use bright outdoor lighting. Clear any walking paths of anything that might make someone trip, such as rocks or tools. Regularly check to see if handrails are loose or broken. Make sure that both sides of any steps have handrails. Any raised decks and porches should have guardrails on the edges. Have any leaves, snow, or ice cleared regularly. Use sand or salt on walking paths during winter. Clean up any spills in your garage right away. This includes oil or grease spills. What can I do in the bathroom? Use night lights. Install grab bars by the toilet and in the tub and shower. Do not use towel bars as grab bars. Use non-skid mats or decals in the tub or shower. If you need to sit down in the shower, use a plastic, non-slip stool. Keep the floor dry. Clean up any water that spills on the floor  as soon as it happens. Remove soap buildup in the tub or shower regularly. Attach bath mats securely with double-sided non-slip rug tape. Do not have throw rugs and other things on the floor that can make you trip. What can I do in the bedroom? Use night lights. Make sure that you have a light by your bed that is easy to reach. Do not use any sheets or blankets that are too big for your bed. They should not hang down onto the floor. Have a firm chair that has side arms. You can use this for support while you get dressed. Do not have throw rugs and other things on the floor that can make you trip. What can I do in the kitchen? Clean up any spills right away. Avoid walking on wet floors. Keep items that you use a lot in easy-to-reach places. If you need to reach something above you, use a strong step stool that has a grab bar. Keep electrical cords out of the way. Do not use floor polish or wax that makes floors slippery. If you must use wax, use non-skid floor wax. Do not have throw rugs and other things on the floor that can make you trip. What can I do with my stairs? Do not leave any items on the stairs. Make sure that there are handrails on both sides of the stairs and use them. Fix handrails that  are broken or loose. Make sure that handrails are as long as the stairways. Check any carpeting to make sure that it is firmly attached to the stairs. Fix any carpet that is loose or worn. Avoid having throw rugs at the top or bottom of the stairs. If you do have throw rugs, attach them to the floor with carpet tape. Make sure that you have a light switch at the top of the stairs and the bottom of the stairs. If you do not have them, ask someone to add them for you. What else can I do to help prevent falls? Wear shoes that: Do not have high heels. Have rubber bottoms. Are comfortable and fit you well. Are closed at the toe. Do not wear sandals. If you use a stepladder: Make sure that it is  fully opened. Do not climb a closed stepladder. Make sure that both sides of the stepladder are locked into place. Ask someone to hold it for you, if possible. Clearly mark and make sure that you can see: Any grab bars or handrails. First and last steps. Where the edge of each step is. Use tools that help you move around (mobility aids) if they are needed. These include: Canes. Walkers. Scooters. Crutches. Turn on the lights when you go into a dark area. Replace any light bulbs as soon as they burn out. Set up your furniture so you have a clear path. Avoid moving your furniture around. If any of your floors are uneven, fix them. If there are any pets around you, be aware of where they are. Review your medicines with your doctor. Some medicines can make you feel dizzy. This can increase your chance of falling. Ask your doctor what other things that you can do to help prevent falls. This information is not intended to replace advice given to you by your health care provider. Make sure you discuss any questions you have with your health care provider. Document Released: 09/26/2009 Document Revised: 05/07/2016 Document Reviewed: 01/04/2015 Elsevier Interactive Patient Education  2017 Elsevier Inc.  Prediabetes Eating Plan Prediabetes is a condition that causes blood sugar (glucose) levels to be higher than normal. This increases the risk for developing type 2 diabetes (type 2 diabetes mellitus). Working with a health care provider or nutrition specialist (dietitian) to make diet and lifestyle changes can help prevent the onset of diabetes. These changes may help you: Control your blood glucose levels. Improve your cholesterol levels. Manage your blood pressure. What are tips for following this plan? Reading food labels Read food labels to check the amount of fat, salt (sodium), and sugar in prepackaged foods. Avoid foods that have: Saturated fats. Trans fats. Added sugars. Avoid foods  that have more than 300 milligrams (mg) of sodium per serving. Limit your sodium intake to less than 2,300 mg each day. Shopping Avoid buying pre-made and processed foods. Avoid buying drinks with added sugar. Cooking Cook with olive oil. Do not use butter, lard, or ghee. Bake, broil, grill, steam, or boil foods. Avoid frying. Meal planning  Work with your dietitian to create an eating plan that is right for you. This may include tracking how many calories you take in each day. Use a food diary, notebook, or mobile application to track what you eat at each meal. Consider following a Mediterranean diet. This includes: Eating several servings of fresh fruits and vegetables each day. Eating fish at least twice a week. Eating one serving each day of whole grains, beans, nuts, and  seeds. Using olive oil instead of other fats. Limiting alcohol. Limiting red meat. Using nonfat or low-fat dairy products. Consider following a plant-based diet. This includes dietary choices that focus on eating mostly vegetables and fruit, grains, beans, nuts, and seeds. If you have high blood pressure, you may need to limit your sodium intake or follow a diet such as the DASH (Dietary Approaches to Stop Hypertension) eating plan. The DASH diet aims to lower high blood pressure.  Lifestyle Set weight loss goals with help from your health care team. It is recommended that most people with prediabetes lose 7% of their body weight. Exercise for at least 30 minutes 5 or more days a week. Attend a support group or seek support from a mental health counselor. Take over-the-counter and prescription medicines only as told by your health care provider. What foods are recommended? Fruits Berries. Bananas. Apples. Oranges. Grapes. Papaya. Mango. Pomegranate. Kiwi.Grapefruit. Cherries. Vegetables Lettuce. Spinach. Peas. Beets. Cauliflower. Cabbage. Broccoli. Carrots.Tomatoes. Squash. Eggplant. Herbs. Peppers. Onions.  Cucumbers. Brussels sprouts. Grains Whole grains, such as whole-wheat or whole-grain breads, crackers, cereals, and pasta. Unsweetened oatmeal. Bulgur. Barley. Quinoa. Brown rice. Corn orwhole-wheat flour tortillas or taco shells. Meats and other proteins Seafood. Poultry without skin. Lean cuts of pork and beef. Tofu. Eggs. Nuts.Beans. Dairy Low-fat or fat-free dairy products, such as yogurt, cottage cheese, and cheese. Beverages Water. Tea. Coffee. Sugar-free or diet soda. Seltzer water. Low-fat or nonfatmilk. Milk alternatives, such as soy or almond milk. Fats and oils Olive oil. Canola oil. Sunflower oil. Grapeseed oil. Avocado. Walnuts. Sweets and desserts Sugar-free or low-fat pudding. Sugar-free or low-fat ice cream and other frozentreats. Seasonings and condiments Herbs. Sodium-free spices. Mustard. Relish. Low-salt, low-sugar ketchup.Low-salt, low-sugar barbecue sauce. Low-fat or fat-free mayonnaise. The items listed above may not be a complete list of recommended foods and beverages. Contact a dietitian for more information. What foods are not recommended? Fruits Fruits canned with syrup. Vegetables Canned vegetables. Frozen vegetables with butter or cream sauce. Grains Refined white flour and flour products, such as bread, pasta, snack foods, andcereals. Meats and other proteins Fatty cuts of meat. Poultry with skin. Breaded or fried meat. Processed meats. Dairy Full-fat yogurt, cheese, or milk. Beverages Sweetened drinks, such as iced tea and soda. Fats and oils Butter. Lard. Ghee. Sweets and desserts Baked goods, such as cake, cupcakes, pastries, cookies, and cheesecake. Seasonings and condiments Spice mixes with added salt. Ketchup. Barbecue sauce. Mayonnaise. The items listed above may not be a complete list of foods and beverages that are not recommended. Contact a dietitian for more information. Where to find more information American Diabetes Association:  www.diabetes.org Summary You may need to make diet and lifestyle changes to help prevent the onset of diabetes. These changes can help you control blood sugar, improve cholesterol levels, and manage blood pressure. Set weight loss goals with help from your health care team. It is recommended that most people with prediabetes lose 7% of their body weight. Consider following a Mediterranean diet. This includes eating plenty of fresh fruits and vegetables, whole grains, beans, nuts, seeds, fish, and low-fat dairy, and using olive oil instead of other fats. This information is not intended to replace advice given to you by your health care provider. Make sure you discuss any questions you have with your healthcare provider. Document Revised: 02/29/2020 Document Reviewed: 02/29/2020 Elsevier Patient Education  2022 [employer].

## 2021-06-20 ENCOUNTER — Other Ambulatory Visit: Payer: Self-pay

## 2021-06-20 ENCOUNTER — Ambulatory Visit (INDEPENDENT_AMBULATORY_CARE_PROVIDER_SITE_OTHER): Payer: Medicare Other | Admitting: Nurse Practitioner

## 2021-06-20 ENCOUNTER — Encounter: Payer: Self-pay | Admitting: Nurse Practitioner

## 2021-06-20 ENCOUNTER — Ambulatory Visit: Payer: Medicare Other | Admitting: Family Medicine

## 2021-06-20 VITALS — BP 140/67 | HR 57 | Temp 97.5°F | Resp 20 | Ht 67.0 in | Wt 143.0 lb

## 2021-06-20 DIAGNOSIS — B029 Zoster without complications: Secondary | ICD-10-CM

## 2021-06-20 MED ORDER — IBUPROFEN 600 MG PO TABS: 600.0000 mg | ORAL_TABLET | Freq: Three times a day (TID) | ORAL | 0 refills | Status: DC | PRN

## 2021-06-20 MED ORDER — HYDROCORTISONE 1 % EX LOTN: 1.0000 "application " | TOPICAL_LOTION | Freq: Two times a day (BID) | CUTANEOUS | 0 refills | Status: DC

## 2021-06-20 MED ORDER — ACYCLOVIR 800 MG PO TABS
800.0000 mg | ORAL_TABLET | Freq: Every day | ORAL | 0 refills | Status: DC
Start: 2021-06-20 — End: 2021-09-04

## 2021-06-20 NOTE — Progress Notes (Signed)
Acute Office Visit  Subjective:    [name], male    DOB: 01-06-1946, 75 y.o.   MRN: [id number]  Chief Complaint  Patient presents with   Rash    Shingles?     Rash  Patient is a 75 year old male who presents to clinic today for shingle rash on left side torso underneath the breast.  He reports symptoms appeared 24 to 36 hours ago.  Patient cannot remember if he has had chickenpox in the past or received the shingles vaccine.  Patient is reporting moderate to severe pain, and increasing pruritus.  Past Medical History:  Diagnosis Date   Amnesia    He was found and reuintied with his family in 29 after 27 years.   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 His family found him after 50 years   Arthritis    Cataract    Concussion    hx of   COPD (chronic obstructive pulmonary disease) ([place])    Myocardial infarction ([place])    2007 [facility]   Pneumonia    exposure to caustic chemicals   Stroke [facility])    multiple strokes    Past Surgical History:  Procedure Laterality Date   ANTERIOR CERVICAL DECOMP/DISCECTOMY FUSION  05/16/2012   Procedure: ANTERIOR CERVICAL DECOMPRESSION/DISCECTOMY FUSION 2 LEVELS;  Surgeon: Kristeen Miss, MD;  Location: [facility];  Service: Neurosurgery;  Laterality: N/A;  Cervical Five-Six, Cervical Six-Seven Anterior Cervical Decompression/Diskectomy Fusion   CARDIAC CATHETERIZATION     COLONOSCOPY W/ POLYPECTOMY     CORONARY ANGIOPLASTY     EYE SURGERY     cataracts   JOINT REPLACEMENT     Left knee replacement   JOINT REPLACEMENT     left elbow at age 52   ROTATOR CUFF REPAIR     left   TONSILLECTOMY      Family History  Problem Relation Age of Onset   Heart disease Mother    COPD Father    Aneurysm Father    Anesthesia problems Neg Hx    Hypotension Neg Hx    Malignant hyperthermia Neg Hx    Pseudochol deficiency Neg Hx     Social History   Socioeconomic History   Marital status: Married     Spouse name: Shirlean Mylar   Number of children: 7   Years of education: college   Highest education level: Some college, no degree  Occupational History   Occupation: Retired    Comment: Museum/gallery curator  Tobacco Use   Smoking status: Light Smoker    Pack years: 0.00    Types: Cigars    Last attempt to quit: 04/03/2013    Years since quitting: 8.2   Smokeless tobacco: Never   Tobacco comments:    One cigar per day.  Smoked 40 years cigarettes.  10 years ago quit cigarettes.  Vaping Use   Vaping Use: Never used  Substance and Sexual Activity   Alcohol use: No    Alcohol/week: 0.0 standard drinks   Drug use: No    Types: Marijuana   Sexual activity: Yes  Other Topics Concern   Not on file  Social History Narrative   Lives with his fourth wife.    Social Determinants of Health   Financial Resource Strain: Low Risk    Difficulty of Paying Living Expenses: Not hard at all  Food Insecurity: No Food Insecurity   Worried About Charity fundraiser in the  Last Year: Never true   Ran Out of Food in the Last Year: Never true  Transportation Needs: No Transportation Needs   Lack of Transportation (Medical): No   Lack of Transportation (Non-Medical): No  Physical Activity: Insufficiently Active   Days of Exercise per Week: 7 days   Minutes of Exercise per Session: 10 min  Stress: No Stress Concern Present   Feeling of Stress : Not at all  Social Connections: Socially Integrated   Frequency of Communication with Friends and Family: More than three times a week   Frequency of Social Gatherings with Friends and Family: More than three times a week   Attends Religious Services: 1 to 4 times per year   Active Member of [employer] or Organizations: Yes   Attends Archivist Meetings: 1 to 4 times per year   Marital Status: Married  Human resources officer Violence: Not At Risk   Fear of Current or Ex-Partner: No   Emotionally Abused: No   Physically Abused: No   Sexually Abused: No    Outpatient  Medications Prior to Visit  Medication Sig Dispense Refill   Albuterol Sulfate 108 (90 Base) MCG/ACT AEPB Inhale 2 puffs into the lungs every 6 (six) hours as needed (shortness of breath/ wheeze). 1 each 0   dipyridamole-aspirin (AGGRENOX) 200-25 MG 12hr capsule Take 1 capsule by mouth 2 (two) times daily. 180 capsule 3   Fluticasone-Salmeterol (WIXELA INHUB) 250-50 MCG/DOSE AEPB INHALE 1 PUFF BY MOUTH EVERY 12 HOURS 180 each 3   loratadine (CLARITIN) 10 MG tablet Take 10 mg by mouth daily.     nebivolol (BYSTOLIC) 5 MG tablet Take 1 tablet (5 mg total) by mouth daily. 90 tablet 3   rosuvastatin (CRESTOR) 40 MG tablet Take 1 tablet (40 mg total) by mouth daily. 90 tablet 3   nitroGLYCERIN (NITROSTAT) 0.4 MG SL tablet Place 1 tablet (0.4 mg total) under the tongue every 5 (five) minutes as needed for chest pain. (Patient not taking: Reported on 06/20/2021) 50 tablet 3   No facility-administered medications prior to visit.    No Known Allergies  Review of Systems  Constitutional: Negative.   HENT: Negative.    Gastrointestinal: Negative.   Skin:  Positive for rash.  Neurological: Negative.   All other systems reviewed and are negative.     Objective:    Physical Exam Vitals and nursing note reviewed.  Constitutional:      Appearance: Normal appearance.  HENT:     Head: Normocephalic.     Nose: Nose normal.     Mouth/Throat:     Mouth: Mucous membranes are moist.     Pharynx: Oropharynx is clear.  Eyes:     Conjunctiva/sclera: Conjunctivae normal.  Cardiovascular:     Rate and Rhythm: Normal rate and regular rhythm.  Pulmonary:     Effort: Pulmonary effort is normal.     Breath sounds: Normal breath sounds.  Abdominal:     General: Bowel sounds are normal.  Skin:    Findings: Erythema and rash present.          Comments: Unilateral shingle rash   Neurological:     Mental Status: He is alert and oriented to person, place, and time.    BP 140/67   Pulse (!) 57   Temp  (!) 97.5 F (36.4 C)   Resp 20   Ht _0  (1.702 m)   Wt 143 lb (64.9 kg)   SpO2 98%   BMI 22.40 kg/m  Wt Readings from Last 3 Encounters:  06/20/21 143 lb (64.9 kg)  06/04/21 144 lb (65.3 kg)  05/23/21 144 lb (65.3 kg)    There are no preventive care reminders to display for this patient.  There are no preventive care reminders to display for this patient.   Lab Results  Component Value Date   TSH 4.460 08/16/2013   Lab Results  Component Value Date   WBC 9.3 05/23/2021   HGB 17.2 05/23/2021   HCT 51.0 05/23/2021   MCV 88 05/23/2021   PLT 177 05/23/2021   Lab Results  Component Value Date   NA 138 05/23/2021   K 4.2 05/23/2021   CO2 23 05/23/2021   GLUCOSE 100 (H) 05/23/2021   BUN 11 05/23/2021   CREATININE 0.92 05/23/2021   BILITOT 0.4 05/23/2021   ALKPHOS 105 05/23/2021   AST 13 05/23/2021   ALT 14 05/23/2021   PROT 6.8 05/23/2021   ALBUMIN 4.5 05/23/2021   CALCIUM 9.2 05/23/2021   EGFR 87 05/23/2021   Lab Results  Component Value Date   CHOL 104 05/23/2021   Lab Results  Component Value Date   HDL 39 (L) 05/23/2021   Lab Results  Component Value Date   LDLCALC 46 05/23/2021   Lab Results  Component Value Date   TRIG 98 05/23/2021   Lab Results  Component Value Date   CHOLHDL 2.7 05/23/2021   Lab Results  Component Value Date   HGBA1C 6.0 05/23/2021       Assessment & Plan:   Problem List Items Addressed This Visit       Other   Herpes zoster without complication - Primary    Acyclovir 800 mg tablet by mouth 5 times daily, ibuprofen for pain, hydrocortisone cream for itching, keep shingle rash covered until crusted over.  Education provided to patient printed handouts given.  Rx sent to pharmacy follow-up in 3 days.       Relevant Medications   acyclovir (ZOVIRAX) 800 MG tablet   ibuprofen (ADVIL) 600 MG tablet   hydrocortisone 1 % lotion     Meds ordered this encounter  Medications   acyclovir (ZOVIRAX) 800 MG tablet     Sig: Take 1 tablet (800 mg total) by mouth 5 (five) times daily.    Dispense:  50 tablet    Refill:  0    Order Specific Question:   Supervising Provider    Answer:   Janora Norlander [[id number]]   ibuprofen (ADVIL) 600 MG tablet    Sig: Take 1 tablet (600 mg total) by mouth every 8 (eight) hours as needed.    Dispense:  30 tablet    Refill:  0    Order Specific Question:   Supervising Provider    Answer:   Janora Norlander [[id number]]   hydrocortisone 1 % lotion    Sig: Apply 1 application topically 2 (two) times daily.    Dispense:  118 mL    Refill:  0    Order Specific Question:   Supervising Provider    Answer:   Janora Norlander [[id number]]     Ivy Lynn, NP

## 2021-06-20 NOTE — Assessment & Plan Note (Addendum)
Acyclovir 800 mg tablet by mouth 5 times daily, ibuprofen for pain, hydrocortisone cream for itching, keep shingle rash covered until crusted over.  Education provided to patient printed handouts given.  Rx sent to pharmacy follow-up in 3 days.

## 2021-06-20 NOTE — Patient Instructions (Signed)

## 2021-06-23 ENCOUNTER — Ambulatory Visit (INDEPENDENT_AMBULATORY_CARE_PROVIDER_SITE_OTHER): Payer: Medicare Other | Admitting: Family Medicine

## 2021-06-23 ENCOUNTER — Other Ambulatory Visit: Payer: Self-pay

## 2021-06-23 ENCOUNTER — Encounter: Payer: Self-pay | Admitting: Family Medicine

## 2021-06-23 VITALS — BP 137/61 | HR 64 | Temp 97.3°F | Ht 67.0 in | Wt 145.8 lb

## 2021-06-23 DIAGNOSIS — B029 Zoster without complications: Secondary | ICD-10-CM | POA: Diagnosis not present

## 2021-06-23 NOTE — Progress Notes (Signed)
Subjective:  [name], male    DOB: 07-02-1946  Age: 75 y.o. MRN: [id number]  CC: Follow-up (rash)   HPI Luis Salinas presents for recheck of shingles rash he is keeping it covered.  He is using hydrocortisone cream on it.  He continues to use the acyclovir 5 times daily.  He started on July 8.  He denies any adverse reactions from his treatment.  The pain is mitigated but still present.   Depression screen [facility] 2/9 06/23/2021 06/20/2021 06/04/2021  Decreased Interest 0 0 0  Down, Depressed, Hopeless 0 0 0  PHQ - 2 Score 0 0 0    History Jeromey has a past medical history of Amnesia, Amnesia (1967), Arthritis, Cataract, Concussion, COPD (chronic obstructive pulmonary disease) (HCC), Myocardial infarction (HCC), Pneumonia, and Stroke (HCC).   He has a past surgical history that includes Cardiac catheterization; Coronary angioplasty; Tonsillectomy; Rotator cuff repair; Colonoscopy w/ polypectomy; Anterior cervical decomp/discectomy fusion (05/16/2012); Joint replacement; Joint replacement; and Eye surgery.   His family history includes Aneurysm in his father; COPD in his father; Heart disease in his mother.He reports that he has been smoking cigars. He has never used smokeless tobacco. He reports that he does not drink alcohol and does not use drugs.    ROS Review of Systems  Constitutional:  Negative for chills, diaphoresis and fever.   Objective:  BP 137/61   Pulse 64   Temp (!) 97.3 F (36.3 C)   Ht 5\' 7"  (1.702 m)   Wt 145 lb 12.8 oz (66.1 kg)   SpO2 94%   BMI 22.84 kg/m   BP Readings from Last 3 Encounters:  06/23/21 137/61  06/20/21 140/67  05/23/21 131/61    Wt Readings from Last 3 Encounters:  06/23/21 145 lb 12.8 oz (66.1 kg)  06/20/21 143 lb (64.9 kg)  06/04/21 144 lb (65.3 kg)     Physical Exam Chest:     Chest wall: Tenderness (Specific to the area of the eruption.  It is vesicular erythema.  It is ranged in patches approximately 6 in  number ranging from 1/2 to 1.5 cm each.  None are crusted however none look inflamed either.) present.      Assessment & Plan:   There are no diagnoses linked to this encounter.     I am having 06/06/21. Hession "Bill" maintain his loratadine, Albuterol Sulfate, dipyridamole-aspirin, nebivolol, rosuvastatin, nitroGLYCERIN, Fluticasone-Salmeterol, acyclovir, ibuprofen, and hydrocortisone.  Allergies as of 06/23/2021   No Known Allergies      Medication List        Accurate as of June 23, 2021  9:31 AM. If you have any questions, ask your nurse or doctor.          acyclovir 800 MG tablet Commonly known as: ZOVIRAX Take 1 tablet (800 mg total) by mouth 5 (five) times daily.   Albuterol Sulfate 108 (90 Base) MCG/ACT Aepb Commonly known as: PROAIR RESPICLICK Inhale 2 puffs into the lungs every 6 (six) hours as needed (shortness of breath/ wheeze).   dipyridamole-aspirin 200-25 MG 12hr capsule Commonly known as: AGGRENOX Take 1 capsule by mouth 2 (two) times daily.   Fluticasone-Salmeterol 250-50 MCG/DOSE Aepb Commonly known as: Wixela Inhub INHALE 1 PUFF BY MOUTH EVERY 12 HOURS   hydrocortisone 1 % lotion Apply 1 application topically 2 (two) times daily.   ibuprofen 600 MG tablet Commonly known as: ADVIL Take 1 tablet (600 mg total) by mouth every 8 (eight) hours as  needed.   loratadine 10 MG tablet Commonly known as: CLARITIN Take 10 mg by mouth daily.   nebivolol 5 MG tablet Commonly known as: Bystolic Take 1 tablet (5 mg total) by mouth daily.   nitroGLYCERIN 0.4 MG SL tablet Commonly known as: NITROSTAT Place 1 tablet (0.4 mg total) under the tongue every 5 (five) minutes as needed for chest pain.   rosuvastatin 40 MG tablet Commonly known as: CRESTOR Take 1 tablet (40 mg total) by mouth daily.       Wound care reviewed.  Continue treatment as is.  Follow-up: No follow-ups on file.  Mechele Claude, M.D.

## 2021-08-25 ENCOUNTER — Telehealth: Payer: Self-pay | Admitting: Family Medicine

## 2021-08-26 NOTE — Telephone Encounter (Signed)
Left message to return call 

## 2021-08-26 NOTE — Telephone Encounter (Signed)
Pts wife returned missed call. Please call wife when available.

## 2021-08-27 NOTE — Telephone Encounter (Signed)
I spoke with the pt's wife and she has already scheduled pt appt with Dr Dettinger next week to discuss oxygen at night.

## 2021-09-04 ENCOUNTER — Other Ambulatory Visit: Payer: Self-pay

## 2021-09-04 ENCOUNTER — Encounter: Payer: Self-pay | Admitting: Family Medicine

## 2021-09-04 ENCOUNTER — Ambulatory Visit (INDEPENDENT_AMBULATORY_CARE_PROVIDER_SITE_OTHER): Payer: Medicare Other | Admitting: Family Medicine

## 2021-09-04 VITALS — BP 135/79 | HR 90 | Ht 67.0 in | Wt 150.0 lb

## 2021-09-04 DIAGNOSIS — J411 Mucopurulent chronic bronchitis: Secondary | ICD-10-CM

## 2021-09-04 MED ORDER — BREZTRI AEROSPHERE 160-9-4.8 MCG/ACT IN AERO: 2.0000 | INHALATION_SPRAY | Freq: Two times a day (BID) | RESPIRATORY_TRACT | 11 refills | Status: DC

## 2021-09-04 NOTE — Progress Notes (Signed)
BP 135/79   Pulse 90   Ht 5\' 7"  (1.702 m)   Wt 150 lb (68 kg)   SpO2 (!) 85% Comment: at rest/ without 02  BMI 23.49 kg/m    Subjective:   [name], male    DOB: Feb 15, 1946, 75 y.o.   MRN: [id number]  HPI: Luis Salinas is a 75 y.o. male presenting on 09/04/2021 for Medical Management of Chronic Issues and COPD   HPI COPD Patient is coming in for COPD recheck today.  He is currently on Wixela and albuterol.  He has a mild chronic cough but denies any major coughing spells or wheezing spells.  He has 7nighttime symptoms per week and 7daytime symptoms per week currently. Patient is still smoking about 1ppd  Patient's oxygen went down to 85% at rest and on 2 L nasal cannula here in the office came back up to 93%.  Relevant past medical, surgical, family and social history reviewed and updated as indicated. Interim medical history since our last visit reviewed. Allergies and medications reviewed and updated.  Review of Systems  Constitutional:  Negative for chills and fever.  Respiratory:  Positive for cough, shortness of breath and wheezing.   Cardiovascular:  Negative for chest pain, palpitations and leg swelling.  Musculoskeletal:  Negative for back pain and gait problem.  Skin:  Negative for rash.  All other systems reviewed and are negative.  Per HPI unless specifically indicated above   Allergies as of 09/04/2021   No Known Allergies      Medication List        Accurate as of September 04, 2021  9:36 AM. If you have any questions, ask your nurse or doctor.          STOP taking these medications    acyclovir 800 MG tablet Commonly known as: ZOVIRAX Stopped by: September 06, 2021, MD   Fluticasone-Salmeterol 250-50 MCG/DOSE Aepb Commonly known as: Wixela Inhub Stopped by: Nils Pyle Arman Loy, MD       TAKE these medications    Albuterol Sulfate 108 (90 Base) MCG/ACT Aepb Commonly known as: PROAIR RESPICLICK Inhale 2 puffs into the  lungs every 6 (six) hours as needed (shortness of breath/ wheeze).   Breztri Aerosphere 160-9-4.8 MCG/ACT Aero Generic drug: Budeson-Glycopyrrol-Formoterol Inhale 2 puffs into the lungs 2 (two) times daily. Started by: Elige Radon, MD   dipyridamole-aspirin 200-25 MG 12hr capsule Commonly known as: AGGRENOX Take 1 capsule by mouth 2 (two) times daily.   hydrocortisone 1 % lotion Apply 1 application topically 2 (two) times daily.   ibuprofen 600 MG tablet Commonly known as: ADVIL Take 1 tablet (600 mg total) by mouth every 8 (eight) hours as needed.   loratadine 10 MG tablet Commonly known as: CLARITIN Take 10 mg by mouth daily.   nebivolol 5 MG tablet Commonly known as: Bystolic Take 1 tablet (5 mg total) by mouth daily.   nitroGLYCERIN 0.4 MG SL tablet Commonly known as: NITROSTAT Place 1 tablet (0.4 mg total) under the tongue every 5 (five) minutes as needed for chest pain.   rosuvastatin 40 MG tablet Commonly known as: CRESTOR Take 1 tablet (40 mg total) by mouth daily.               Durable Medical Equipment  (From admission, onward)           Start     Ordered   09/04/21 0000  For home use only DME oxygen  Comments: Give oxygen tanks and portable oxygen concentrator and home oxygen concentrator  Question Answer Comment  Length of Need Lifetime   Mode or (Route) Nasal cannula   Liters per Minute 2   Frequency Continuous (stationary and portable oxygen unit needed)   Oxygen conserving device Yes   Oxygen delivery system Gas      09/04/21 0936             Objective:   BP 135/79   Pulse 90   Ht 5\' 7"  (1.702 m)   Wt 150 lb (68 kg)   SpO2 (!) 85% Comment: at rest/ without 02  BMI 23.49 kg/m   Wt Readings from Last 3 Encounters:  09/04/21 150 lb (68 kg)  06/23/21 145 lb 12.8 oz (66.1 kg)  06/20/21 143 lb (64.9 kg)    Physical Exam Vitals and nursing note reviewed.  Constitutional:      General: He is not in acute  distress.    Appearance: He is well-developed. He is not diaphoretic.  Eyes:     General: No scleral icterus.    Conjunctiva/sclera: Conjunctivae normal.  Neck:     Thyroid: No thyromegaly.  Cardiovascular:     Rate and Rhythm: Normal rate and regular rhythm.     Heart sounds: Normal heart sounds. No murmur heard. Pulmonary:     Effort: Pulmonary effort is normal. No respiratory distress.     Breath sounds: Wheezing present. No rhonchi or rales.  Chest:     Chest wall: No tenderness.  Musculoskeletal:        General: Normal range of motion.     Cervical back: Neck supple.  Lymphadenopathy:     Cervical: No cervical adenopathy.  Skin:    General: Skin is warm and dry.     Findings: No rash.  Neurological:     Mental Status: He is alert and oriented to person, place, and time.     Coordination: Coordination normal.  Psychiatric:        Behavior: Behavior normal.   Assessment & Plan:   Problem List Items Addressed This Visit       Respiratory   COPD (chronic obstructive pulmonary disease) (HCC) - Primary   Relevant Medications   Budeson-Glycopyrrol-Formoterol (BREZTRI AEROSPHERE) 160-9-4.8 MCG/ACT AERO   Other Relevant Orders   For home use only DME oxygen    Will give patient triple therapy instead.  Patient went down to oxygen at rest of 85% and came up to 93% on 2 L nasal cannula here in the office at rest. Follow up plan: Return if symptoms worsen or fail to improve.  Counseling provided for all of the vaccine components Orders Placed This Encounter  Procedures   For home use only DME oxygen     08/21/21, MD [facility] 09/04/2021, 9:36 AM

## 2021-09-05 DIAGNOSIS — J449 Chronic obstructive pulmonary disease, unspecified: Secondary | ICD-10-CM | POA: Diagnosis not present

## 2021-09-16 ENCOUNTER — Telehealth: Payer: Self-pay | Admitting: Family Medicine

## 2021-09-16 NOTE — Telephone Encounter (Signed)
Pt has questions about his oxygen. Offered an appt on 10/6 to discuss with Dr Dettinger but they wanted a message put in to discuss with a nurse before they made an appt. Please call back and advise.

## 2021-09-16 NOTE — Telephone Encounter (Signed)
Wife states that pt is becoming SOB when he uses his portable O2. Pt is fine while sitting in the house on 2L. She would like to know if he could increase the Liters when moving around.  Ok to wait for Dettinger's response tomorrow.

## 2021-09-17 NOTE — Telephone Encounter (Signed)
Patient's wife aware.  No need for order.

## 2021-09-17 NOTE — Telephone Encounter (Signed)
Yes that is fine to go ahead and increase to 3 L while he is moving around, if he needs an order for it or message sent to the company then go ahead and send that for him.

## 2021-09-18 ENCOUNTER — Telehealth: Payer: Self-pay | Admitting: Family Medicine

## 2021-09-18 NOTE — Telephone Encounter (Signed)
Wife aware

## 2021-09-18 NOTE — Telephone Encounter (Signed)
Yes okay to go ahead and her up to 3, if worsens let me know

## 2021-09-19 ENCOUNTER — Other Ambulatory Visit: Payer: Self-pay

## 2021-09-19 ENCOUNTER — Emergency Department (HOSPITAL_COMMUNITY): Payer: Medicare Other

## 2021-09-19 ENCOUNTER — Encounter (HOSPITAL_COMMUNITY): Payer: Self-pay

## 2021-09-19 ENCOUNTER — Inpatient Hospital Stay (HOSPITAL_COMMUNITY)
Admission: EM | Admit: 2021-09-19 | Discharge: 2021-09-24 | DRG: 291 | Disposition: A | Discharge status: Home / Self Care | Payer: Medicare Other | Attending: Internal Medicine | Admitting: Internal Medicine

## 2021-09-19 DIAGNOSIS — Z955 Presence of coronary angioplasty implant and graft: Secondary | ICD-10-CM

## 2021-09-19 DIAGNOSIS — E8809 Other disorders of plasma-protein metabolism, not elsewhere classified: Secondary | ICD-10-CM | POA: Diagnosis present

## 2021-09-19 DIAGNOSIS — Z825 Family history of asthma and other chronic lower respiratory diseases: Secondary | ICD-10-CM

## 2021-09-19 DIAGNOSIS — J9621 Acute and chronic respiratory failure with hypoxia: Secondary | ICD-10-CM | POA: Diagnosis present

## 2021-09-19 DIAGNOSIS — F1220 Cannabis dependence, uncomplicated: Secondary | ICD-10-CM | POA: Diagnosis present

## 2021-09-19 DIAGNOSIS — K761 Chronic passive congestion of liver: Secondary | ICD-10-CM | POA: Diagnosis present

## 2021-09-19 DIAGNOSIS — Z743 Need for continuous supervision: Secondary | ICD-10-CM | POA: Diagnosis not present

## 2021-09-19 DIAGNOSIS — Z6821 Body mass index (BMI) 21.0-21.9, adult: Secondary | ICD-10-CM | POA: Diagnosis not present

## 2021-09-19 DIAGNOSIS — E785 Hyperlipidemia, unspecified: Secondary | ICD-10-CM | POA: Diagnosis present

## 2021-09-19 DIAGNOSIS — Z8701 Personal history of pneumonia (recurrent): Secondary | ICD-10-CM | POA: Diagnosis not present

## 2021-09-19 DIAGNOSIS — J439 Emphysema, unspecified: Secondary | ICD-10-CM | POA: Diagnosis not present

## 2021-09-19 DIAGNOSIS — E119 Type 2 diabetes mellitus without complications: Secondary | ICD-10-CM | POA: Diagnosis present

## 2021-09-19 DIAGNOSIS — Z20822 Contact with and (suspected) exposure to covid-19: Secondary | ICD-10-CM | POA: Diagnosis not present

## 2021-09-19 DIAGNOSIS — I110 Hypertensive heart disease with heart failure: Secondary | ICD-10-CM | POA: Diagnosis not present

## 2021-09-19 DIAGNOSIS — I152 Hypertension secondary to endocrine disorders: Secondary | ICD-10-CM | POA: Diagnosis present

## 2021-09-19 DIAGNOSIS — E782 Mixed hyperlipidemia: Secondary | ICD-10-CM | POA: Diagnosis present

## 2021-09-19 DIAGNOSIS — R9431 Abnormal electrocardiogram [ECG] [EKG]: Secondary | ICD-10-CM | POA: Diagnosis not present

## 2021-09-19 DIAGNOSIS — I5032 Chronic diastolic (congestive) heart failure: Secondary | ICD-10-CM

## 2021-09-19 DIAGNOSIS — I10 Essential (primary) hypertension: Secondary | ICD-10-CM | POA: Diagnosis not present

## 2021-09-19 DIAGNOSIS — R6889 Other general symptoms and signs: Secondary | ICD-10-CM | POA: Diagnosis not present

## 2021-09-19 DIAGNOSIS — R778 Other specified abnormalities of plasma proteins: Secondary | ICD-10-CM | POA: Diagnosis not present

## 2021-09-19 DIAGNOSIS — F17290 Nicotine dependence, other tobacco product, uncomplicated: Secondary | ICD-10-CM | POA: Diagnosis present

## 2021-09-19 DIAGNOSIS — E876 Hypokalemia: Secondary | ICD-10-CM | POA: Diagnosis not present

## 2021-09-19 DIAGNOSIS — J449 Chronic obstructive pulmonary disease, unspecified: Secondary | ICD-10-CM | POA: Diagnosis present

## 2021-09-19 DIAGNOSIS — Z87820 Personal history of traumatic brain injury: Secondary | ICD-10-CM

## 2021-09-19 DIAGNOSIS — Z8249 Family history of ischemic heart disease and other diseases of the circulatory system: Secondary | ICD-10-CM

## 2021-09-19 DIAGNOSIS — R600 Localized edema: Secondary | ICD-10-CM

## 2021-09-19 DIAGNOSIS — I480 Paroxysmal atrial fibrillation: Secondary | ICD-10-CM | POA: Diagnosis not present

## 2021-09-19 DIAGNOSIS — I272 Pulmonary hypertension, unspecified: Secondary | ICD-10-CM | POA: Diagnosis not present

## 2021-09-19 DIAGNOSIS — J441 Chronic obstructive pulmonary disease with (acute) exacerbation: Secondary | ICD-10-CM | POA: Diagnosis not present

## 2021-09-19 DIAGNOSIS — E440 Moderate protein-calorie malnutrition: Secondary | ICD-10-CM | POA: Diagnosis not present

## 2021-09-19 DIAGNOSIS — I509 Heart failure, unspecified: Secondary | ICD-10-CM | POA: Diagnosis not present

## 2021-09-19 DIAGNOSIS — I5031 Acute diastolic (congestive) heart failure: Secondary | ICD-10-CM

## 2021-09-19 DIAGNOSIS — R0602 Shortness of breath: Secondary | ICD-10-CM | POA: Diagnosis not present

## 2021-09-19 DIAGNOSIS — Z79899 Other long term (current) drug therapy: Secondary | ICD-10-CM

## 2021-09-19 DIAGNOSIS — I4891 Unspecified atrial fibrillation: Secondary | ICD-10-CM | POA: Diagnosis not present

## 2021-09-19 DIAGNOSIS — R609 Edema, unspecified: Secondary | ICD-10-CM | POA: Diagnosis not present

## 2021-09-19 DIAGNOSIS — Z8673 Personal history of transient ischemic attack (TIA), and cerebral infarction without residual deficits: Secondary | ICD-10-CM | POA: Diagnosis not present

## 2021-09-19 DIAGNOSIS — R7989 Other specified abnormal findings of blood chemistry: Secondary | ICD-10-CM | POA: Diagnosis not present

## 2021-09-19 DIAGNOSIS — I5021 Acute systolic (congestive) heart failure: Secondary | ICD-10-CM | POA: Diagnosis not present

## 2021-09-19 DIAGNOSIS — Z720 Tobacco use: Secondary | ICD-10-CM | POA: Diagnosis present

## 2021-09-19 DIAGNOSIS — D696 Thrombocytopenia, unspecified: Secondary | ICD-10-CM

## 2021-09-19 DIAGNOSIS — M1990 Unspecified osteoarthritis, unspecified site: Secondary | ICD-10-CM | POA: Diagnosis present

## 2021-09-19 DIAGNOSIS — Z7902 Long term (current) use of antithrombotics/antiplatelets: Secondary | ICD-10-CM

## 2021-09-19 DIAGNOSIS — I252 Old myocardial infarction: Secondary | ICD-10-CM

## 2021-09-19 DIAGNOSIS — J90 Pleural effusion, not elsewhere classified: Secondary | ICD-10-CM | POA: Diagnosis not present

## 2021-09-19 DIAGNOSIS — I517 Cardiomegaly: Secondary | ICD-10-CM | POA: Diagnosis not present

## 2021-09-19 DIAGNOSIS — I5033 Acute on chronic diastolic (congestive) heart failure: Secondary | ICD-10-CM | POA: Diagnosis not present

## 2021-09-19 DIAGNOSIS — I2510 Atherosclerotic heart disease of native coronary artery without angina pectoris: Secondary | ICD-10-CM | POA: Diagnosis present

## 2021-09-19 DIAGNOSIS — R0689 Other abnormalities of breathing: Secondary | ICD-10-CM | POA: Diagnosis not present

## 2021-09-19 DIAGNOSIS — J811 Chronic pulmonary edema: Secondary | ICD-10-CM | POA: Diagnosis not present

## 2021-09-19 DIAGNOSIS — Z96652 Presence of left artificial knee joint: Secondary | ICD-10-CM | POA: Diagnosis present

## 2021-09-19 LAB — CBC WITH DIFFERENTIAL/PLATELET
Abs Immature Granulocytes: 0.02 10*3/uL (ref 0.00–0.07)
Basophils Absolute: 0 10*3/uL (ref 0.0–0.1)
Basophils Relative: 1 %
Eosinophils Absolute: 0 10*3/uL (ref 0.0–0.5)
Eosinophils Relative: 0 %
HCT: 45.4 % (ref 39.0–52.0)
Hemoglobin: 14.3 g/dL (ref 13.0–17.0)
Immature Granulocytes: 0 %
Lymphocytes Relative: 21 %
Lymphs Abs: 1.5 10*3/uL (ref 0.7–4.0)
MCH: 28.8 pg (ref 26.0–34.0)
MCHC: 31.5 g/dL (ref 30.0–36.0)
MCV: 91.3 fL (ref 80.0–100.0)
Monocytes Absolute: 0.5 10*3/uL (ref 0.1–1.0)
Monocytes Relative: 7 %
Neutro Abs: 5 10*3/uL (ref 1.7–7.7)
Neutrophils Relative %: 71 %
Platelets: 128 10*3/uL — ABNORMAL LOW (ref 150–400)
RBC: 4.97 MIL/uL (ref 4.22–5.81)
RDW: 16.8 % — ABNORMAL HIGH (ref 11.5–15.5)
WBC: 7 10*3/uL (ref 4.0–10.5)
nRBC: 0 % (ref 0.0–0.2)

## 2021-09-19 LAB — COMPREHENSIVE METABOLIC PANEL
ALT: 19 U/L (ref 0–44)
AST: 24 U/L (ref 15–41)
Albumin: 3 g/dL — ABNORMAL LOW (ref 3.5–5.0)
Alkaline Phosphatase: 106 U/L (ref 38–126)
Anion gap: 6 (ref 5–15)
BUN: 14 mg/dL (ref 8–23)
CO2: 33 mmol/L — ABNORMAL HIGH (ref 22–32)
Calcium: 8.1 mg/dL — ABNORMAL LOW (ref 8.9–10.3)
Chloride: 102 mmol/L (ref 98–111)
Creatinine, Ser: 1.1 mg/dL (ref 0.61–1.24)
GFR, Estimated: >60 mL/min (ref >60)
Glucose, Bld: 112 mg/dL — ABNORMAL HIGH (ref 70–99)
Potassium: 2.7 mmol/L — CL (ref 3.5–5.1)
Sodium: 141 mmol/L (ref 135–145)
Total Bilirubin: 0.9 mg/dL (ref 0.3–1.2)
Total Protein: 6.1 g/dL — ABNORMAL LOW (ref 6.5–8.1)

## 2021-09-19 LAB — RESP PANEL BY RT-PCR (FLU A&B, COVID) ARPGX2
Influenza A by PCR: NEGATIVE
Influenza B by PCR: NEGATIVE
SARS Coronavirus 2 by RT PCR: NEGATIVE

## 2021-09-19 LAB — MAGNESIUM: Magnesium: 2.7 mg/dL — ABNORMAL HIGH (ref 1.7–2.4)

## 2021-09-19 LAB — URINALYSIS, ROUTINE W REFLEX MICROSCOPIC
Bilirubin Urine: NEGATIVE
Glucose, UA: NEGATIVE mg/dL
Ketones, ur: NEGATIVE mg/dL
Leukocytes,Ua: NEGATIVE
Nitrite: NEGATIVE
Protein, ur: NEGATIVE mg/dL
Specific Gravity, Urine: 1.004 — ABNORMAL LOW (ref 1.005–1.030)
pH: 8 (ref 5.0–8.0)

## 2021-09-19 LAB — TROPONIN I (HIGH SENSITIVITY)
Troponin I (High Sensitivity): 29 ng/L — ABNORMAL HIGH (ref <18)
Troponin I (High Sensitivity): 30 ng/L — ABNORMAL HIGH (ref <18)

## 2021-09-19 LAB — PROTIME-INR
INR: 1.1 (ref 0.8–1.2)
Prothrombin Time: 14.6 seconds (ref 11.4–15.2)

## 2021-09-19 LAB — BRAIN NATRIURETIC PEPTIDE: B Natriuretic Peptide: 439 pg/mL — ABNORMAL HIGH (ref 0.0–100.0)

## 2021-09-19 MED ORDER — POTASSIUM CHLORIDE 10 MEQ/100ML IV SOLN
10.0000 meq | INTRAVENOUS | Status: AC
Start: 2021-09-19 — End: 2021-09-20
  Administered 2021-09-19 (×2): 10 meq via INTRAVENOUS
  Filled 2021-09-19 (×2): qty 100

## 2021-09-19 MED ORDER — ENOXAPARIN SODIUM 80 MG/0.8ML IJ SOSY
65.0000 mg | PREFILLED_SYRINGE | Freq: Two times a day (BID) | INTRAMUSCULAR | Status: DC
  Filled 2021-09-19: qty 0.8

## 2021-09-19 MED ORDER — POTASSIUM CHLORIDE 10 MEQ/100ML IV SOLN
10.0000 meq | Freq: Once | INTRAVENOUS | Status: AC
  Administered 2021-09-19: 10 meq via INTRAVENOUS
  Filled 2021-09-19: qty 100

## 2021-09-19 MED ORDER — FUROSEMIDE 10 MG/ML IJ SOLN
40.0000 mg | Freq: Once | INTRAMUSCULAR | Status: AC
  Administered 2021-09-19: 40 mg via INTRAVENOUS
  Filled 2021-09-19: qty 4

## 2021-09-19 MED ORDER — GLUCERNA SHAKE PO LIQD
237.0000 mL | Freq: Three times a day (TID) | ORAL | Status: DC
  Administered 2021-09-20: 237 mL via ORAL
  Filled 2021-09-19 (×9): qty 237

## 2021-09-19 MED ORDER — POTASSIUM CHLORIDE CRYS ER 20 MEQ PO TBCR
40.0000 meq | EXTENDED_RELEASE_TABLET | Freq: Once | ORAL | Status: AC
  Administered 2021-09-19: 40 meq via ORAL
  Filled 2021-09-19: qty 2

## 2021-09-19 MED ORDER — FUROSEMIDE 10 MG/ML IJ SOLN
40.0000 mg | Freq: Two times a day (BID) | INTRAMUSCULAR | Status: DC
  Administered 2021-09-19 – 2021-09-23 (×9): 40 mg via INTRAVENOUS
  Filled 2021-09-19 (×10): qty 4

## 2021-09-19 NOTE — ED Triage Notes (Signed)
Pt bib ems from home for sob with hx of copd and chf.  BLE edema and edema noted to arms.  Pt is alert.  Ems has pt on 6 lpm o2 via [place].  Pt is on home o2 is 3lpm. Reports finally received home 02 4 days ago.  Increased wob noted.

## 2021-09-19 NOTE — H&P (Signed)
History and Physical  Luis Salinas ZTI:[id number] DOB: [birth year] DOA: 09/19/2021  Referring physician: Jacalyn Lefevre, MD  PCP: Dettinger, Elige Radon, MD  Patient coming from: Home  Chief Complaint: Shortness of breath  HPI: Luis Salinas is a 75 y.o. male with medical history significant for diastolic CHF, COPD with chronic respiratory failure on supplemental oxygen at 2 LPM via [place] at baseline, essential hypertension, mixed hyperlipidemia, CAD who [facility] due to worsening shortness of breath.  Patient complained of 72-month onset of increasing shortness of breath and leg swelling as well as increased abdominal girth.  Shortness of breath worsening within last week with difficulty in being able to lay flat in bed, shortness of breath was at rest and worsens on exertion.  Patient states that leg swelling got worse to the extent that he was unable to wear his shoes and pants.  He was convinced by wife to follow-up with a physician, he uses supplemental oxygen at 2 LPM at baseline due to COPD, patient called the PCP yesterday requesting for increased supplemental oxygen to 3 L/min due to shortness of breath.  Despite this, shortness of breath worsened today that he had to activate EMS, on arrival of EMS team, O2 sat was noted to be in the mid 80s while on supplemental oxygen via [place] at 3 LPM, he was placed on NRB at 15 L and taken to the ED for further evaluation and management.  He denies fever, chills, chest pain, diarrhea or constipation.  ED Course:  In the emergency department, he was hemodynamically stable except requiring supplemental oxygen at 6 LPM via [place].  Work-up in the ED showed normal CBC except for "hypokalemia, normal BMP except for hypokalemia.  Albumin 3.0, BNP 439, troponin x 1-29.  Influenza A, B, SARS coronavirus 2 was negative.   Chest x-ray showed 1. cardiomegaly with vascular congestion, interstitial edema and right greater than left pleural effusion.  2. Underlying emphysema. Airspace disease at the bases may reflect atelectasis or pneumonia. He was treated with IV Lasix 40 mg x 1, potassium was replenished.  Hospitalist was asked to admit patient for further evaluation and management.  Review of Systems: Constitutional: Negative for chills and fever.  HENT: Negative for ear pain and sore throat.   Eyes: Negative for pain and visual disturbance.  Respiratory: Positive for shortness of breath.  Negative for cough, chest tightness   Cardiovascular: Negative for chest pain and palpitations.  Gastrointestinal: Negative for abdominal pain and vomiting.  Endocrine: Negative for polyphagia and polyuria.  Genitourinary: Negative for decreased urine volume, dysuria, enuresis Musculoskeletal: Negative for increased leg swelling.  Negative for arthralgias and back pain.  Skin: Negative for color change and rash.  Allergic/Immunologic: Negative for immunocompromised state.  Neurological: Negative for tremors, syncope, speech difficulty, weakness, light-headedness and headaches.  Hematological: Does not bruise/bleed easily.  All other systems reviewed and are negative   Past Medical History:  Diagnosis Date   Amnesia    He was found and reuintied with his family in 19 after 27 years.   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 His family found him after 27 years   Arthritis    Cataract    Concussion    hx of   COPD (chronic obstructive pulmonary disease) (HCC)    Myocardial infarction (HCC)    2007 [facility]   Pneumonia    exposure to caustic chemicals   Stroke (  HCC)    multiple strokes   Past Surgical History:  Procedure Laterality Date   ANTERIOR CERVICAL DECOMP/DISCECTOMY FUSION  05/16/2012   Procedure: ANTERIOR CERVICAL DECOMPRESSION/DISCECTOMY FUSION 2 LEVELS;  Surgeon: Barnett Abu, MD;  Location: [facility];  Service: Neurosurgery;  Laterality: N/A;  Cervical Five-Six, Cervical Six-Seven Anterior Cervical  Decompression/Diskectomy Fusion   CARDIAC CATHETERIZATION     COLONOSCOPY W/ POLYPECTOMY     CORONARY ANGIOPLASTY     EYE SURGERY     cataracts   JOINT REPLACEMENT     Left knee replacement   JOINT REPLACEMENT     left elbow at age 88   ROTATOR CUFF REPAIR     left   TONSILLECTOMY      Social History:  reports that he has been smoking cigars. He has never used smokeless tobacco. He reports that he does not drink alcohol and does not use drugs.   No Known Allergies  Family History  Problem Relation Age of Onset   Heart disease Mother    COPD Father    Aneurysm Father    Anesthesia problems Neg Hx    Hypotension Neg Hx    Malignant hyperthermia Neg Hx    Pseudochol deficiency Neg Hx      Prior to Admission medications   Medication Sig Start Date End Date Taking? Authorizing Provider  Albuterol Sulfate 108 (90 Base) MCG/ACT AEPB Inhale 2 puffs into the lungs every 6 (six) hours as needed (shortness of breath/ wheeze). 04/09/18   Raliegh Ip, DO  Budeson-Glycopyrrol-Formoterol (BREZTRI AEROSPHERE) 160-9-4.8 MCG/ACT AERO Inhale 2 puffs into the lungs 2 (two) times daily. 09/04/21   Dettinger, Elige Radon, MD  dipyridamole-aspirin (AGGRENOX) 200-25 MG 12hr capsule Take 1 capsule by mouth 2 (two) times daily. 11/22/20   Dettinger, Elige Radon, MD  hydrocortisone 1 % lotion Apply 1 application topically 2 (two) times daily. 06/20/21   Daryll Drown, NP  ibuprofen (ADVIL) 600 MG tablet Take 1 tablet (600 mg total) by mouth every 8 (eight) hours as needed. 06/20/21   Daryll Drown, NP  loratadine (CLARITIN) 10 MG tablet Take 10 mg by mouth daily.    [provider]  nebivolol (BYSTOLIC) 5 MG tablet Take 1 tablet (5 mg total) by mouth daily. 11/22/20   Dettinger, Elige Radon, MD  nitroGLYCERIN (NITROSTAT) 0.4 MG SL tablet Place 1 tablet (0.4 mg total) under the tongue every 5 (five) minutes as needed for chest pain. 11/25/20   Dettinger, Elige Radon, MD  rosuvastatin (CRESTOR) 40  MG tablet Take 1 tablet (40 mg total) by mouth daily. 11/22/20   Dettinger, Elige Radon, MD    Physical Exam: BP (!) 144/79   Pulse 88   Temp 98.2 F (36.8 C)   Resp 16   Ht 5\' 7"  (1.702 m)   Wt 68 kg   SpO2 93%   BMI 23.49 kg/m   General: 75 y.o. year-old male well developed well nourished in no acute distress.  Alert and oriented x3. HEENT: NCAT, EOMI Neck: Supple, trachea medial Cardiovascular: Irregular rate and rhythm with no rubs or gallops.  No thyromegaly or JVD noted.  Bilateral lower extremity edema. 2/4 pulses in all 4 extremities. Respiratory: Clear to auscultation with no wheezes or rales. Good inspiratory effort. Abdomen: Soft, nontender nondistended with normal bowel sounds x4 quadrants. Muskuloskeletal: No cyanosis or clubbing noted bilaterally Neuro: CN II-XII intact, strength 5/5 x 4, sensation, reflexes intact Skin: No ulcerative lesions noted or rashes Psychiatry: Judgement  and insight appear normal. Mood is appropriate for condition and setting          Labs on Admission:  Basic Metabolic Panel: Recent Labs  Lab 09/19/21 1718  NA 141  K 2.7*  CL 102  CO2 33*  GLUCOSE 112*  BUN 14  CREATININE 1.10  CALCIUM 8.1*   Liver Function Tests: Recent Labs  Lab 09/19/21 1718  AST 24  ALT 19  ALKPHOS 106  BILITOT 0.9  PROT 6.1*  ALBUMIN 3.0*   No results for input(s): LIPASE, AMYLASE in the last 168 hours. No results for input(s): AMMONIA in the last 168 hours. CBC: Recent Labs  Lab 09/19/21 1718  WBC 7.0  NEUTROABS 5.0  HGB 14.3  HCT 45.4  MCV 91.3  PLT 128*   Cardiac Enzymes: No results for input(s): CKTOTAL, CKMB, CKMBINDEX, TROPONINI in the last 168 hours.  BNP (last 3 results) Recent Labs    09/19/21 1718  BNP 439.0*    ProBNP (last 3 results) No results for input(s): PROBNP in the last 8760 hours.  CBG: No results for input(s): GLUCAP in the last 168 hours.  Radiological Exams on Admission: DG Chest Port 1 View  Result  Date: 09/19/2021 CLINICAL DATA:  Shortness of breath EXAM: PORTABLE CHEST 1 VIEW COMPARISON:  03/16/2013 FINDINGS: Hardware in the cervical spine. Cardiomegaly with vascular congestion and interstitial pulmonary edema. There is underlying emphysema. Small left and small moderate right pleural effusion. Basilar airspace disease. Aortic atherosclerosis. IMPRESSION: 1. Cardiomegaly with vascular congestion, interstitial edema and right greater than left pleural effusion 2. Underlying emphysema. Airspace disease at the bases may reflect atelectasis or pneumonia Electronically Signed   By: Jasmine Pang M.D.   On: 09/19/2021 17:46    EKG: I independently viewed the EKG done and my findings are as followed: A. fib with rate control and LAFB.  Qtc = 530 ms  Assessment/Plan Present on Admission:  CAD in native artery  Hyperlipidemia  HTN (hypertension)  COPD (chronic obstructive pulmonary disease) (HCC)  Principal Problem:   Acute exacerbation of CHF (congestive heart failure) (HCC) Active Problems:   Hyperlipidemia   CAD in native artery   COPD (chronic obstructive pulmonary disease) (HCC)   HTN (hypertension)   AF (paroxysmal atrial fibrillation) (HCC)   Prolonged QT interval   Hypokalemia   Elevated brain natriuretic peptide (BNP) level   Thrombocytopenia (HCC)   Elevated troponin  Acute on chronic respiratory failure possibly secondary to acute exacerbation of CHF Elevated BNP (439 ms) Chest x-ray showed cardiomegaly with vascular congestion, interstitial edema and right greater than left pleural effusion.  Continue total input/output, daily weights and fluid restriction IV Lasix 40 mg x 1 was given; continue IV Lasix 40 mg twice daily Continue Cardiac diet  Echocardiogram done on 02/21/2018 showed LVEF of 55 to 60% with no  RWMA, but with LV consistent with G2 DD.  Echocardiogram will be done in the morning  Continue supplemental oxygen via NRB to obtain O2 sat > 92% with plan to wean  patient down to home supplemental oxygen at 2 LPM via [place] as tolerated  Questionable CAP POA Per chest x-ray Chest x-ray showed Airspace disease at the bases which may reflect atelectasis or pneumonia. Patient denies cough, fever, chills.  Patient symptoms may be due to above Procalcitonin will be done prior to starting any antibiotics  Elevated troponin possibly secondary to type II demand ischemia Troponin x 2 - 29 > 30; patient denies chest pain, continue to  trend troponin  Paroxysmal atrial fibrillation EKG showed atrial fibrillation with rate control CHA2DS2- VASc score   is = 5    Which is  equal to = 7.2 % annual risk of stroke  Continue telemetry Therapeutic Lovenox will be given, with plan to transition to oral DOAC if patient continues to be in A. Fib Patient is currently on home nebivolol, this may be changed to Toprol if A. fib converted to RVR  Prolonged QTc (530 ms) Patient's hypokalemia may have contributed to this, this will be replenished Avoid QT prolonging drugs Magnesium level will be checked  Hypokalemia K+ is 2.7 K+ will be replenished Please monitor for AM K+ for further replenishmemnt  Thrombocytopenia possibly reactive Platelets 128, continue to monitor platelet levels  Hypoalbuminemia possibly secondary to moderate protein calorie malnutrition Albumin 3.0, protein supplement will be provided  Essential hypertension Continue Bystolic  Mixed hyperlipidemia Continue Crestor  CAD Continue Aggrenox, Lipitor, Bystolic  Chronic respiratory failure due to COPD (not in acute exacerbation) Continue budesonide- glycopyrrolate- formoterol  DVT prophylaxis: Lovenox  Code Status: Full code  Family Communication: Wife at bedside (all questions answered to satisfaction)  Disposition Plan:  Patient is from:                        home Anticipated DC to:                   SNF or family members home Anticipated DC date:               2-3 days Anticipated  DC barriers:          Patient requires inpatient management due to worsening hypoxia requiring increased oxygen need in the setting of CHF    Consults called: None  Admission status: Inpatient    Frankey Shown MD [facility]  09/19/2021, 8:24 PM

## 2021-09-19 NOTE — Progress Notes (Signed)
ANTICOAGULATION CONSULT NOTE - Initial Consult  Pharmacy Consult for enoxaparin Indication: atrial fibrillation  No Known Allergies  Patient Measurements: Height: 5\' 7"  (170.2 cm) Weight: 68 kg (150 lb) IBW/kg (Calculated) : 66.1  Vital Signs: Temp: 98.2 F (36.8 C) (10/07 1739) BP: 144/79 (10/07 1830) Pulse Rate: 34 (10/07 2044)  Labs: Recent Labs    09/19/21 1718 09/19/21 1921  HGB 14.3  --   HCT 45.4  --   PLT 128*  --   CREATININE 1.10  --   TROPONINIHS 29* 30*    Estimated Creatinine Clearance: 54.2 mL/min (by C-G formula based on SCr of 1.1 mg/dL).   Medical History: Past Medical History:  Diagnosis Date   Amnesia    He was found and reuintied with his family in 33 after 27 years.   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 His family found him after 27 years   Arthritis    Cataract    Concussion    hx of   COPD (chronic obstructive pulmonary disease) (HCC)    Myocardial infarction (HCC)    2007 [facility]   Pneumonia    exposure to caustic chemicals   Stroke [facility])    multiple strokes    Assessment: Luis Salinas is a 74 y.o. male presenting with increased shortness of breath and on arrival to ED was found to be in afib, which is new for him, and hypokalemic with prolonged QTc 530. Pharmacy consulted to start enoxaparin. Not on anticoagulation prior to admission. Patient is taking Aggrenox PTA.  H/H wnl, platelets low at 128. SCr 1.1.    Goal of Therapy:  Anti-Xa level 0.6-1 units/ml 4hrs after LMWH dose given Monitor platelets by anticoagulation protocol: Yes   Plan:  - Lovenox 1 mg/kg (65 mg) subcutaneously every 12 hours - Monitor CBC, platelet trend, SCr, and for signs and symptoms of bleeding - F/u transition to oral anticoagulation    Thank you for allowing 61 to participate in this patients care. [place], PharmD 09/19/2021 9:15 PM  **Pharmacist phone directory can be found on [email or web address] listed under [facility]  Pharmacy**

## 2021-09-19 NOTE — ED Provider Notes (Signed)
[facility] EMERGENCY DEPARTMENT Provider Note   CSN: [id number] Arrival date & time: 09/19/21  1654     History Chief Complaint  Patient presents with   Shortness of Breath     Luis Salinas is a 75 y.o. male.  Pt presents to the ED today with sob.  Pt has been having increasing sob for several days.  He has a hx of COPD and had been on 2L oxygen via [place].  The pt called his doctor yesterday and asked if he could increase to 3L since he was more sob.  The pt was even more sob today, so EMS was called.  When EMS arrived, his O2 was in the mid-80s on 3L.  Pt was put on 15L NRB and transported here.        Past Medical History:  Diagnosis Date   Amnesia    He was found and reuintied with his family in 18 after 27 years.   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 His family found him after 27 years   Arthritis    Cataract    Concussion    hx of   COPD (chronic obstructive pulmonary disease) (HCC)    Myocardial infarction (HCC)    2007 [facility]   Pneumonia    exposure to caustic chemicals   Stroke [facility])    multiple strokes    Patient Active Problem List   Diagnosis Date Noted   Herpes zoster without complication 06/20/2021   Abnormal echocardiogram 03/23/2018   Prediabetes 02/17/2018   HTN (hypertension) 05/01/2013   Tobacco abuse 04/02/2013   Brain TIA 03/16/2013   Hyperlipidemia 03/16/2013   CAD in native artery 03/16/2013   Presence of stent in coronary artery 03/16/2013   COPD (chronic obstructive pulmonary disease) (HCC) 03/16/2013    Past Surgical History:  Procedure Laterality Date   ANTERIOR CERVICAL DECOMP/DISCECTOMY FUSION  05/16/2012   Procedure: ANTERIOR CERVICAL DECOMPRESSION/DISCECTOMY FUSION 2 LEVELS;  Surgeon: Barnett Abu, MD;  Location: [facility];  Service: Neurosurgery;  Laterality: N/A;  Cervical Five-Six, Cervical Six-Seven Anterior Cervical Decompression/Diskectomy Fusion   CARDIAC CATHETERIZATION     COLONOSCOPY W/  POLYPECTOMY     CORONARY ANGIOPLASTY     EYE SURGERY     cataracts   JOINT REPLACEMENT     Left knee replacement   JOINT REPLACEMENT     left elbow at age 34   ROTATOR CUFF REPAIR     left   TONSILLECTOMY         Family History  Problem Relation Age of Onset   Heart disease Mother    COPD Father    Aneurysm Father    Anesthesia problems Neg Hx    Hypotension Neg Hx    Malignant hyperthermia Neg Hx    Pseudochol deficiency Neg Hx     Social History   Tobacco Use   Smoking status: Light Smoker    Types: Cigars    Last attempt to quit: 04/03/2013    Years since quitting: 8.4   Smokeless tobacco: Never   Tobacco comments:    One cigar per day.  Smoked 40 years cigarettes.  10 years ago quit cigarettes.  Vaping Use   Vaping Use: Never used  Substance Use Topics   Alcohol use: No    Alcohol/week: 0.0 standard drinks   Drug use: No    Types: Marijuana    Home Medications Prior to Admission medications  Medication Sig Start Date End Date Taking? Authorizing Provider  Albuterol Sulfate 108 (90 Base) MCG/ACT AEPB Inhale 2 puffs into the lungs every 6 (six) hours as needed (shortness of breath/ wheeze). 04/09/18   Raliegh Ip, DO  Budeson-Glycopyrrol-Formoterol (BREZTRI AEROSPHERE) 160-9-4.8 MCG/ACT AERO Inhale 2 puffs into the lungs 2 (two) times daily. 09/04/21   Dettinger, Elige Radon, MD  dipyridamole-aspirin (AGGRENOX) 200-25 MG 12hr capsule Take 1 capsule by mouth 2 (two) times daily. 11/22/20   Dettinger, Elige Radon, MD  hydrocortisone 1 % lotion Apply 1 application topically 2 (two) times daily. 06/20/21   Daryll Drown, NP  ibuprofen (ADVIL) 600 MG tablet Take 1 tablet (600 mg total) by mouth every 8 (eight) hours as needed. 06/20/21   Daryll Drown, NP  loratadine (CLARITIN) 10 MG tablet Take 10 mg by mouth daily.    [provider]  nebivolol (BYSTOLIC) 5 MG tablet Take 1 tablet (5 mg total) by mouth daily. 11/22/20   Dettinger, Elige Radon, MD   nitroGLYCERIN (NITROSTAT) 0.4 MG SL tablet Place 1 tablet (0.4 mg total) under the tongue every 5 (five) minutes as needed for chest pain. 11/25/20   Dettinger, Elige Radon, MD  rosuvastatin (CRESTOR) 40 MG tablet Take 1 tablet (40 mg total) by mouth daily. 11/22/20   Dettinger, Elige Radon, MD    Allergies    Patient has no known allergies.  Review of Systems   Review of Systems  Respiratory:  Positive for shortness of breath.   All other systems reviewed and are negative.  Physical Exam Updated Vital Signs BP (!) 144/79   Pulse 88   Temp 98.2 F (36.8 C)   Resp 16   Ht 5\' 7"  (1.702 m)   Wt 68 kg   SpO2 93%   BMI 23.49 kg/m   Physical Exam Vitals and nursing note reviewed.  Constitutional:      Appearance: Normal appearance.  HENT:     Head: Normocephalic and atraumatic.     Right Ear: External ear normal.     Left Ear: External ear normal.     Nose: Nose normal.     Mouth/Throat:     Mouth: Mucous membranes are moist.     Pharynx: Oropharynx is clear.  Eyes:     Extraocular Movements: Extraocular movements intact.     Conjunctiva/sclera: Conjunctivae normal.     Pupils: Pupils are equal, round, and reactive to light.  Cardiovascular:     Rate and Rhythm: Normal rate. Rhythm irregular.     Pulses: Normal pulses.     Heart sounds: Normal heart sounds.  Pulmonary:     Effort: Respiratory distress present.     Breath sounds: Rhonchi present.  Abdominal:     General: Abdomen is flat. Bowel sounds are normal.     Palpations: Abdomen is soft.  Musculoskeletal:        General: Normal range of motion.     Cervical back: Normal range of motion and neck supple.     Right lower leg: Edema present.     Left lower leg: Edema present.  Skin:    General: Skin is warm.     Capillary Refill: Capillary refill takes less than 2 seconds.  Neurological:     General: No focal deficit present.     Mental Status: He is alert and oriented to person, place, and time.  Psychiatric:         Mood and Affect: Mood normal.  Behavior: Behavior normal.        Thought Content: Thought content normal.        Judgment: Judgment normal.    ED Results / Procedures / Treatments   Labs (all labs ordered are listed, but only abnormal results are displayed) Labs Reviewed  COMPREHENSIVE METABOLIC PANEL - Abnormal; Notable for the following components:      Result Value   Potassium 2.7 (*)    CO2 33 (*)    Glucose, Bld 112 (*)    Calcium 8.1 (*)    Total Protein 6.1 (*)    Albumin 3.0 (*)    All other components within normal limits  CBC WITH DIFFERENTIAL/PLATELET - Abnormal; Notable for the following components:   RDW 16.8 (*)    Platelets 128 (*)    All other components within normal limits  BRAIN NATRIURETIC PEPTIDE - Abnormal; Notable for the following components:   B Natriuretic Peptide 439.0 (*)    All other components within normal limits  TROPONIN I (HIGH SENSITIVITY) - Abnormal; Notable for the following components:   Troponin I (High Sensitivity) 29 (*)    All other components within normal limits  RESP PANEL BY RT-PCR (FLU A&B, COVID) [id number]  URINALYSIS, ROUTINE W REFLEX MICROSCOPIC  MAGNESIUM  TROPONIN I (HIGH SENSITIVITY)    EKG EKG Interpretation  Date/Time:  Friday September 19 2021 18:47:00 EDT Ventricular Rate:  81 PR Interval:    QRS Duration: 82 QT Interval:  456 QTC Calculation: 530 R Axis:   243 Text Interpretation: Atrial fibrillation Left anterior fascicular block Low voltage, extremity leads Abnormal R-wave progression, late transition Nonspecific T abnormalities, anterior leads Prolonged QT interval afib is new Confirmed by Jacalyn Lefevre [phone]) on 09/19/2021 6:51:07 PM  Radiology DG Chest Port 1 View  Result Date: 09/19/2021 CLINICAL DATA:  Shortness of breath EXAM: PORTABLE CHEST 1 VIEW COMPARISON:  03/16/2013 FINDINGS: Hardware in the cervical spine. Cardiomegaly with vascular congestion and interstitial pulmonary edema. There is  underlying emphysema. Small left and small moderate right pleural effusion. Basilar airspace disease. Aortic atherosclerosis. IMPRESSION: 1. Cardiomegaly with vascular congestion, interstitial edema and right greater than left pleural effusion 2. Underlying emphysema. Airspace disease at the bases may reflect atelectasis or pneumonia Electronically Signed   By: Jasmine Pang M.D.   On: 09/19/2021 17:46    Procedures Procedures   Medications Ordered in ED Medications  potassium chloride 10 mEq in 100 mL IVPB (has no administration in time range)  potassium chloride SA (KLOR-CON) CR tablet 40 mEq (has no administration in time range)  furosemide (LASIX) injection 40 mg (40 mg Intravenous Given 09/19/21 1849)    ED Course  I have reviewed the triage vital signs and the nursing notes.  Pertinent labs & imaging results that were available during my care of the patient were reviewed by me and considered in my medical decision making (see chart for details).    MDM Rules/Calculators/A&P                           Pt is now on 6L oxygen via [place] and sats are in the low 90s.  Pt is in afib which is new.  Rate is controlled.  CHA2DS2/VAS Stroke Risk Points:  4 (htn, age, hx cva).  He is currently on Aggrenox.  I am going to let the hospitalist decide to change to something else if needed.    K is low and that is replaced.  Mg ordered.  Covid negative.  Pt d/w Dr. Thomes Dinning ([facility]) for admission.  TILTON MARSALIS was evaluated in Emergency Department on 09/19/2021 for the symptoms described in the history of present illness. He was evaluated in the context of the global COVID-19 pandemic, which necessitated consideration that the patient might be at risk for infection with the SARS-CoV-2 virus that causes COVID-19. Institutional protocols and algorithms that pertain to the evaluation of patients at risk for COVID-19 are in a state of rapid change based on information released by regulatory bodies  including the CDC and federal and state organizations. These policies and algorithms were followed during the patient's care in the ED.   CRITICAL CARE Performed by: Jacalyn Lefevre   Total critical care time: 30 minutes  Critical care time was exclusive of separately billable procedures and treating other patients.  Critical care was necessary to treat or prevent imminent or life-threatening deterioration.  Critical care was time spent personally by me on the following activities: development of treatment plan with patient and/or surrogate as well as nursing, discussions with consultants, evaluation of patient's response to treatment, examination of patient, obtaining history from patient or surrogate, ordering and performing treatments and interventions, ordering and review of laboratory studies, ordering and review of radiographic studies, pulse oximetry and re-evaluation of patient's condition.  Final Clinical Impression(s) / ED Diagnoses Final diagnoses:  Acute congestive heart failure, unspecified heart failure type (HCC)  New onset atrial fibrillation (HCC)  Peripheral edema  Hypokalemia    Rx / DC Orders ED Discharge Orders     None        Jacalyn Lefevre, MD 09/19/21 2005

## 2021-09-19 NOTE — ED Notes (Signed)
Date and time results received: 09/19/21  (use smartphrase ".now" to insert current time)  Test: potassium Critical Value: 2.7  Name of Provider Notified: [place]  Orders Received? Or Actions Taken?: NA

## 2021-09-20 ENCOUNTER — Inpatient Hospital Stay (HOSPITAL_COMMUNITY): Payer: Medicare Other

## 2021-09-20 DIAGNOSIS — I5032 Chronic diastolic (congestive) heart failure: Secondary | ICD-10-CM

## 2021-09-20 DIAGNOSIS — I5031 Acute diastolic (congestive) heart failure: Secondary | ICD-10-CM

## 2021-09-20 DIAGNOSIS — Z720 Tobacco use: Secondary | ICD-10-CM

## 2021-09-20 DIAGNOSIS — I2510 Atherosclerotic heart disease of native coronary artery without angina pectoris: Secondary | ICD-10-CM | POA: Diagnosis not present

## 2021-09-20 DIAGNOSIS — I480 Paroxysmal atrial fibrillation: Secondary | ICD-10-CM | POA: Diagnosis not present

## 2021-09-20 DIAGNOSIS — J441 Chronic obstructive pulmonary disease with (acute) exacerbation: Secondary | ICD-10-CM | POA: Diagnosis not present

## 2021-09-20 LAB — ECHOCARDIOGRAM COMPLETE
AR max vel: 2.28 cm2
AV Area VTI: 2.37 cm2
AV Area mean vel: 2.35 cm2
AV Mean grad: 4.7 mmHg
AV Peak grad: 8.6 mmHg
Ao pk vel: 1.46 m/s
Area-P 1/2: 5.13 cm2
Height: 69 in
MV VTI: 2.67 cm2
S' Lateral: 2.7 cm
Weight: 2465.62 oz

## 2021-09-20 LAB — CBC
HCT: 49.1 % (ref 39.0–52.0)
Hemoglobin: 14.8 g/dL (ref 13.0–17.0)
MCH: 27.8 pg (ref 26.0–34.0)
MCHC: 30.1 g/dL (ref 30.0–36.0)
MCV: 92.1 fL (ref 80.0–100.0)
Platelets: 123 10*3/uL — ABNORMAL LOW (ref 150–400)
RBC: 5.33 MIL/uL (ref 4.22–5.81)
RDW: 17 % — ABNORMAL HIGH (ref 11.5–15.5)
WBC: 7.8 10*3/uL (ref 4.0–10.5)
nRBC: 0 % (ref 0.0–0.2)

## 2021-09-20 LAB — COMPREHENSIVE METABOLIC PANEL
ALT: 19 U/L (ref 0–44)
AST: 27 U/L (ref 15–41)
Albumin: 3.1 g/dL — ABNORMAL LOW (ref 3.5–5.0)
Alkaline Phosphatase: 106 U/L (ref 38–126)
Anion gap: 7 (ref 5–15)
BUN: 13 mg/dL (ref 8–23)
CO2: 36 mmol/L — ABNORMAL HIGH (ref 22–32)
Calcium: 8.2 mg/dL — ABNORMAL LOW (ref 8.9–10.3)
Chloride: 99 mmol/L (ref 98–111)
Creatinine, Ser: 1.13 mg/dL (ref 0.61–1.24)
GFR, Estimated: >60 mL/min (ref >60)
Glucose, Bld: 100 mg/dL — ABNORMAL HIGH (ref 70–99)
Potassium: 3.2 mmol/L — ABNORMAL LOW (ref 3.5–5.1)
Sodium: 142 mmol/L (ref 135–145)
Total Bilirubin: 1 mg/dL (ref 0.3–1.2)
Total Protein: 6.5 g/dL (ref 6.5–8.1)

## 2021-09-20 LAB — IRON AND TIBC
Iron: 50 ug/dL (ref 45–182)
Saturation Ratios: 16 % — ABNORMAL LOW (ref 17.9–39.5)
TIBC: 312 ug/dL (ref 250–450)
UIBC: 262 ug/dL

## 2021-09-20 LAB — PHOSPHORUS: Phosphorus: 2.8 mg/dL (ref 2.5–4.6)

## 2021-09-20 LAB — T4, FREE: Free T4: 1.02 ng/dL (ref 0.61–1.12)

## 2021-09-20 LAB — FOLATE: Folate: 12.2 ng/mL (ref >5.9)

## 2021-09-20 LAB — FERRITIN: Ferritin: 54 ng/mL (ref 24–336)

## 2021-09-20 LAB — MRSA NEXT GEN BY PCR, NASAL: MRSA by PCR Next Gen: NOT DETECTED

## 2021-09-20 LAB — PROCALCITONIN: Procalcitonin: <0.1 ng/mL

## 2021-09-20 LAB — MAGNESIUM: Magnesium: 2.4 mg/dL (ref 1.7–2.4)

## 2021-09-20 LAB — APTT: aPTT: 52 seconds — ABNORMAL HIGH (ref 24–36)

## 2021-09-20 LAB — PROTIME-INR
INR: 1.2 (ref 0.8–1.2)
Prothrombin Time: 15.1 seconds (ref 11.4–15.2)

## 2021-09-20 LAB — HEMOGLOBIN A1C
Hgb A1c MFr Bld: 6 % — ABNORMAL HIGH (ref 4.8–5.6)
Mean Plasma Glucose: 125.5 mg/dL

## 2021-09-20 LAB — VITAMIN B12: Vitamin B-12: 315 pg/mL (ref 180–914)

## 2021-09-20 LAB — TSH: TSH: 6.972 u[IU]/mL — ABNORMAL HIGH (ref 0.350–4.500)

## 2021-09-20 MED ORDER — CHLORHEXIDINE GLUCONATE CLOTH 2 % EX PADS
6.0000 | MEDICATED_PAD | Freq: Every day | CUTANEOUS | Status: DC
  Administered 2021-09-20 – 2021-09-24 (×5): 6 via TOPICAL

## 2021-09-20 MED ORDER — UMECLIDINIUM BROMIDE 62.5 MCG/INH IN AEPB
1.0000 | INHALATION_SPRAY | Freq: Every day | RESPIRATORY_TRACT | Status: DC
  Administered 2021-09-20: 1 via RESPIRATORY_TRACT
  Filled 2021-09-20: qty 7

## 2021-09-20 MED ORDER — FLUTICASONE FUROATE-VILANTEROL 100-25 MCG/INH IN AEPB
1.0000 | INHALATION_SPRAY | Freq: Every day | RESPIRATORY_TRACT | Status: DC
  Administered 2021-09-20: 1 via RESPIRATORY_TRACT
  Filled 2021-09-20: qty 28

## 2021-09-20 MED ORDER — ROSUVASTATIN CALCIUM 20 MG PO TABS
40.0000 mg | ORAL_TABLET | Freq: Every day | ORAL | Status: DC
  Administered 2021-09-20 – 2021-09-24 (×5): 40 mg via ORAL
  Filled 2021-09-20 (×5): qty 2

## 2021-09-20 MED ORDER — ENSURE ENLIVE PO LIQD
237.0000 mL | Freq: Two times a day (BID) | ORAL | Status: DC
  Administered 2021-09-21 – 2021-09-22 (×2): 237 mL via ORAL

## 2021-09-20 MED ORDER — ADULT MULTIVITAMIN W/MINERALS CH
1.0000 | ORAL_TABLET | Freq: Every day | ORAL | Status: DC
  Administered 2021-09-21 – 2021-09-24 (×4): 1 via ORAL
  Filled 2021-09-20 (×4): qty 1

## 2021-09-20 MED ORDER — POTASSIUM CHLORIDE CRYS ER 20 MEQ PO TBCR
20.0000 meq | EXTENDED_RELEASE_TABLET | Freq: Every day | ORAL | Status: DC
  Administered 2021-09-21: 20 meq via ORAL
  Filled 2021-09-20: qty 1

## 2021-09-20 MED ORDER — BUDESONIDE 0.5 MG/2ML IN SUSP
0.5000 mg | Freq: Two times a day (BID) | RESPIRATORY_TRACT | Status: DC
  Administered 2021-09-20 – 2021-09-24 (×8): 0.5 mg via RESPIRATORY_TRACT
  Filled 2021-09-20 (×8): qty 2

## 2021-09-20 MED ORDER — POTASSIUM CHLORIDE CRYS ER 20 MEQ PO TBCR
40.0000 meq | EXTENDED_RELEASE_TABLET | Freq: Once | ORAL | Status: AC
  Administered 2021-09-20: 40 meq via ORAL
  Filled 2021-09-20: qty 2

## 2021-09-20 MED ORDER — NEBIVOLOL HCL 2.5 MG PO TABS
5.0000 mg | ORAL_TABLET | Freq: Every day | ORAL | Status: DC
  Administered 2021-09-20 – 2021-09-24 (×5): 5 mg via ORAL
  Filled 2021-09-20 (×3): qty 2
  Filled 2021-09-20 (×3): qty 1
  Filled 2021-09-20: qty 2

## 2021-09-20 MED ORDER — APIXABAN 5 MG PO TABS
5.0000 mg | ORAL_TABLET | Freq: Two times a day (BID) | ORAL | Status: DC
  Administered 2021-09-20 – 2021-09-24 (×9): 5 mg via ORAL
  Filled 2021-09-20 (×9): qty 1

## 2021-09-20 MED ORDER — IPRATROPIUM-ALBUTEROL 0.5-2.5 (3) MG/3ML IN SOLN
3.0000 mL | Freq: Two times a day (BID) | RESPIRATORY_TRACT | Status: DC
  Administered 2021-09-20 – 2021-09-24 (×8): 3 mL via RESPIRATORY_TRACT
  Filled 2021-09-20 (×8): qty 3

## 2021-09-20 MED ORDER — BUDESON-GLYCOPYRROL-FORMOTEROL 160-9-4.8 MCG/ACT IN AERO: 2.0000 | INHALATION_SPRAY | Freq: Two times a day (BID) | RESPIRATORY_TRACT | Status: DC

## 2021-09-20 MED ORDER — ASPIRIN-DIPYRIDAMOLE ER 25-200 MG PO CP12
1.0000 | ORAL_CAPSULE | Freq: Two times a day (BID) | ORAL | Status: DC
  Administered 2021-09-20 – 2021-09-24 (×9): 1 via ORAL
  Filled 2021-09-20 (×14): qty 1
  Filled 2021-09-20: qty 6

## 2021-09-20 MED ORDER — NITROGLYCERIN 0.4 MG SL SUBL: 0.4000 mg | SUBLINGUAL_TABLET | SUBLINGUAL | Status: DC | PRN

## 2021-09-20 MED ORDER — METHYLPREDNISOLONE SODIUM SUCC 40 MG IJ SOLR
40.0000 mg | Freq: Two times a day (BID) | INTRAMUSCULAR | Status: DC
  Administered 2021-09-20 – 2021-09-24 (×9): 40 mg via INTRAVENOUS
  Filled 2021-09-20 (×9): qty 1

## 2021-09-20 MED ORDER — IPRATROPIUM-ALBUTEROL 0.5-2.5 (3) MG/3ML IN SOLN
3.0000 mL | Freq: Four times a day (QID) | RESPIRATORY_TRACT | Status: DC
  Filled 2021-09-20: qty 3

## 2021-09-20 NOTE — Progress Notes (Signed)
PROGRESS NOTE  Luis Salinas FKC:[id number] DOB: 31-Jul-1946 DOA: 09/19/2021 PCP: Dettinger, Elige Radon, MD  Brief History:  75 year old male with a history of diabetes mellitus type 2, stroke, foot artery disease/MI, COPD, hypertension, tobacco abuse presenting with 5-day history of shortness of breath, worsening lower extremity edema, increased abdominal girth, and orthopnea.  The patient states that he has had to sleep in a recliner for the last 2 weeks because of orthopnea.  He denies any fevers, chills, headache, neck pain, chest pain, nausea, vomiting, diarrhea, abdominal pain, dysuria, hematuria, hematochezia, melena.  He has had a cough with yellow sputum. The patient was on 2 L nasal cannula at home which was increased to 3 L by his PCP on the day prior to admission.  He continued to have shortness of breath.  As result, EMS was activated.  The patient was found to have oxygen saturation in the mid 80s on 3 L.  In the emergency department, the patient was afebrile hemodynamically stable with oxygen saturation in the low 90s on 6 L.  Chest x-ray showed increased interstitial markings and right greater than left pleural effusion.  BMP showed sodium 141, potassium 2.7, serum creatinine 1.10.  LFTs unremarkable.  WBC 7.0, hemoglobin 14.3, platelets 128,000.  The patient was started on intravenous furosemide.  Assessment/Plan: Acute diastolic CHF -Patient remains fluid overloaded -Echocardiogram -Continue intravenous furosemide 40 mg twice daily -Accurate I's and O's  Acute on chronic respiratory failure with hypoxia -Secondary to pulmonary edema and COPD exacerbation -Chronically on 2 L nasal cannula -Currently on 6 L nasal cannula -Wean oxygen back to baseline  Atrial fibrillation, new onset, type unspecified -Personally reviewed EKG--atrial fibrillation with nonspecific T wave changes -CHADSVASc = 8 -Start apixaban  COPD exacerbation -Start duo nebs -Start  Pulmicort -Start IV Solu-Medrol  Thrombocytopenia -Likely due to chronic hepatic congestion -Serum B12 -Folic acid -TSH  Coronary artery disease -11/19/2017 cath--100% L-Cx s/p BMS -no chest pain presently -personally reviewed EKG--sinus, nonspecific TWI  Essential hypertension -continue bystolic  Diabetes mellitus type 2 -05/29/2020 hemoglobin A1c 6.3 -05/23/2021 hemoglobin A1c 6.0 -Patient is not on any agents in outpatient setting  Hypokalemia -Repleted -Check magnesium  Tobacco abuse -Tobacco cessation discussed  Marijuana dependence -Cessation discussed  Hyperlipidemia -Continue statin        Status is: Inpatient  Remains inpatient appropriate because:Inpatient level of care appropriate due to severity of illness  Dispo: The patient is from: Home              Anticipated d/c is to: Home              Patient currently is not medically stable to d/c.   Difficult to place patient No        Family Communication:   spouse updated 10/8  Consultants:  none  Code Status:  FULL   DVT Prophylaxis:  apixaban   Procedures: As Listed in Progress Note Above  Antibiotics: None      Subjective: Patient is breathing better.  Still has cough with yellow sputum.  Still sob with minimal exertion.  Denies cp, f/c, n/v/d, abd pain  Objective: Vitals:   09/20/21 0506 09/20/21 0600 09/20/21 0700 09/20/21 0724  BP:  (!) 159/98 (!) 106/54   Pulse:  100 89 92  Resp:  15 16 19   Temp: 97.7 F (36.5 C)   98 F (36.7 C)  TempSrc: Oral   Oral  SpO2:  100% 99%  95%  Weight:      Height: 5\' 9"  (1.753 m)       Intake/Output Summary (Last 24 hours) at 09/20/2021 0739 Last data filed at 09/20/2021 0214 Gross per 24 hour  Intake --  Output 3500 ml  Net -3500 ml   Weight change:  Exam:  General:  Pt is alert, follows commands appropriately, not in acute distress HEENT: No icterus, No thrush, No neck mass, [place]/AT Cardiovascular: IRRR, S1/S2, no rubs, no  gallops+JVD Respiratory: bilateral crackles.  Bilateral wheeze Abdomen: Soft/+BS, non tender, non distended, no guarding Extremities: 2+LE edema, No lymphangitis, No petechiae, No rashes, no synovitis   Data Reviewed: I have personally reviewed following labs and imaging studies Basic Metabolic Panel: Recent Labs  Lab 09/19/21 1718 09/19/21 1921 09/20/21 0523  NA 141  --  142  K 2.7*  --  3.2*  CL 102  --  99  CO2 33*  --  36*  GLUCOSE 112*  --  100*  BUN 14  --  13  CREATININE 1.10  --  1.13  CALCIUM 8.1*  --  8.2*  MG  --  2.7* 2.4  PHOS  --   --  2.8   Liver Function Tests: Recent Labs  Lab 09/19/21 1718 09/20/21 0523  AST 24 27  ALT 19 19  ALKPHOS 106 106  BILITOT 0.9 1.0  PROT 6.1* 6.5  ALBUMIN 3.0* 3.1*   No results for input(s): LIPASE, AMYLASE in the last 168 hours. No results for input(s): AMMONIA in the last 168 hours. Coagulation Profile: Recent Labs  Lab 09/19/21 1718 09/20/21 0523  INR 1.1 1.2   CBC: Recent Labs  Lab 09/19/21 1718 09/20/21 0523  WBC 7.0 7.8  NEUTROABS 5.0  --   HGB 14.3 14.8  HCT 45.4 49.1  MCV 91.3 92.1  PLT 128* 123*   Cardiac Enzymes: No results for input(s): CKTOTAL, CKMB, CKMBINDEX, TROPONINI in the last 168 hours. BNP: Invalid input(s): POCBNP CBG: No results for input(s): GLUCAP in the last 168 hours. HbA1C: No results for input(s): HGBA1C in the last 72 hours. Urine analysis:    Component Value Date/Time   COLORURINE STRAW (A) 09/19/2021 2115   APPEARANCEUR CLEAR 09/19/2021 2115   LABSPEC 1.004 (L) 09/19/2021 2115   PHURINE 8.0 09/19/2021 2115   GLUCOSEU NEGATIVE 09/19/2021 2115   HGBUR SMALL (A) 09/19/2021 2115   BILIRUBINUR NEGATIVE 09/19/2021 2115   KETONESUR NEGATIVE 09/19/2021 2115   PROTEINUR NEGATIVE 09/19/2021 2115   UROBILINOGEN 0.2 03/17/2013 0619   NITRITE NEGATIVE 09/19/2021 2115   LEUKOCYTESUR NEGATIVE 09/19/2021 2115   Sepsis Labs: @LABRCNTIP (procalcitonin:4,lacticidven:4) ) Recent  Results (from the past 240 hour(s))  Resp Panel by RT-PCR (Flu A&B, Covid) Nasopharyngeal Swab     Status: None   Collection Time: 09/19/21  6:07 PM   Specimen: Nasopharyngeal Swab; Nasopharyngeal(NP) swabs in vial transport medium  Result Value Ref Range Status   SARS Coronavirus 2 by RT PCR NEGATIVE NEGATIVE Final    Comment: (NOTE) SARS-CoV-2 target nucleic acids are NOT DETECTED.  The SARS-CoV-2 RNA is generally detectable in upper respiratory specimens during the acute phase of infection. The lowest concentration of SARS-CoV-2 viral copies this assay can detect is 138 copies/mL. A negative result does not preclude SARS-Cov-2 infection and should not be used as the sole basis for treatment or other patient management decisions. A negative result may occur with  improper specimen collection/handling, submission of specimen other than nasopharyngeal swab, presence of viral mutation(s) within the  areas targeted by this assay, and inadequate number of viral copies(<138 copies/mL). A negative result must be combined with clinical observations, patient history, and epidemiological information. The expected result is Negative.  Fact Sheet for Patients:  BloggerCourse.com  Fact Sheet for Healthcare Providers:  SeriousBroker.it  This test is no t yet approved or cleared by the [place] FDA and  has been authorized for detection and/or diagnosis of SARS-CoV-2 by FDA under an Emergency Use Authorization (EUA). This EUA will remain  in effect (meaning this test can be used) for the duration of the COVID-19 declaration under Section 564(b)(1) of the Act, 21 U.S.C.section 360bbb-3(b)(1), unless the authorization is terminated  or revoked sooner.       Influenza A by PCR NEGATIVE NEGATIVE Final   Influenza B by PCR NEGATIVE NEGATIVE Final    Comment: (NOTE) The Xpert Xpress SARS-CoV-2/FLU/RSV plus assay is intended as an aid in the  diagnosis of influenza from Nasopharyngeal swab specimens and should not be used as a sole basis for treatment. Nasal washings and aspirates are unacceptable for Xpert Xpress SARS-CoV-2/FLU/RSV testing.  Fact Sheet for Patients: BloggerCourse.com  Fact Sheet for Healthcare Providers: SeriousBroker.it  This test is not yet approved or cleared by the [place] FDA and has been authorized for detection and/or diagnosis of SARS-CoV-2 by FDA under an Emergency Use Authorization (EUA). This EUA will remain in effect (meaning this test can be used) for the duration of the COVID-19 declaration under Section 564(b)(1) of the Act, 21 U.S.C. section 360bbb-3(b)(1), unless the authorization is terminated or revoked.  Performed at [facility], [address], [place], [place] [address]      Scheduled Meds:  Chlorhexidine Gluconate Cloth  6 each Topical Daily   dipyridamole-aspirin  1 capsule Oral BID   enoxaparin (LOVENOX) injection  65 mg Subcutaneous Q12H   feeding supplement (GLUCERNA SHAKE)  237 mL Oral TID BM   fluticasone furoate-vilanterol  1 puff Inhalation Daily   furosemide  40 mg Intravenous Q12H   nebivolol  5 mg Oral Daily   rosuvastatin  40 mg Oral Daily   umeclidinium bromide  1 puff Inhalation Daily   Continuous Infusions:  Procedures/Studies: DG Chest Port 1 View  Result Date: 09/19/2021 CLINICAL DATA:  Shortness of breath EXAM: PORTABLE CHEST 1 VIEW COMPARISON:  03/16/2013 FINDINGS: Hardware in the cervical spine. Cardiomegaly with vascular congestion and interstitial pulmonary edema. There is underlying emphysema. Small left and small moderate right pleural effusion. Basilar airspace disease. Aortic atherosclerosis. IMPRESSION: 1. Cardiomegaly with vascular congestion, interstitial edema and right greater than left pleural effusion 2. Underlying emphysema. Airspace disease at the bases may reflect atelectasis or  pneumonia Electronically Signed   By: Jasmine Pang M.D.   On: 09/19/2021 17:46    Catarina Hartshorn, DO  [facility]  If 7PM-7AM, please contact night-coverage [email or web address] Password [email or web address] 09/20/2021, 7:39 AM   LOS: 1 day

## 2021-09-20 NOTE — Progress Notes (Signed)
Initial Nutrition Assessment  DOCUMENTATION CODES:   Not applicable  INTERVENTION:   Change to Ensure Enlive po BID, each supplement provides 350 kcal and 20 grams of protein  D/C Glucerna Shake po TID, each supplement provides 220 kcal and 10 grams of protein  Add MVI with Minerals   NUTRITION DIAGNOSIS:   Inadequate oral intake related to decreased appetite as evidenced by per patient/family report.  GOAL:   Patient will meet greater than or equal to 90% of their needs  MONITOR:   PO intake, Supplement acceptance, Labs, Weight trends  REASON FOR ASSESSMENT:   Malnutrition Screening Tool    ASSESSMENT:   75 yo male admitted with acute on chronic respiratory failure 2/2 to acute CHF exacerbation. PMH includes CHF, COPD, HTN, HLD, CAD  No recorded po intake. Per RN, pt reports appetite improved today, eating a bit better  Pt currently receiving Glucerna shakes; no hx of DM, not on DM meds, not checking CBGs  Per weight encounters, weight has been stable  Labs: potassium 3.2 (L) Meds: lasix, solumedrol, KCl  NUTRITION - FOCUSED PHYSICAL EXAM:  Unable to perform  Diet Order:   Diet Order             Diet Heart Room service appropriate? Yes; Fluid consistency: Thin  Diet effective now                   EDUCATION NEEDS:   No education needs have been identified at this time  Skin:  Skin Assessment: Reviewed RN Assessment  Last BM:  PTA  Height:   Ht Readings from Last 1 Encounters:  09/20/21 5\' 9"  (1.753 m)    Weight:   Wt Readings from Last 1 Encounters:  09/20/21 69.9 kg   BMI:  Body mass index is 22.76 kg/m.  Estimated Nutritional Needs:   Kcal:  1900-2100 kcals  Protein:  90-110 g  Fluid:  >/= 1.9 L   11/20/21 MS, RDN, LDN, CNSC Registered Dietitian III Clinical Nutrition RD Pager and On-Call Pager Number Located in [place]

## 2021-09-20 NOTE — Progress Notes (Signed)
*  PRELIMINARY RESULTS* Echocardiogram 2D Echocardiogram has been performed.  Carolyne Fiscal 09/20/2021, 1:08 PM

## 2021-09-21 DIAGNOSIS — I2510 Atherosclerotic heart disease of native coronary artery without angina pectoris: Secondary | ICD-10-CM | POA: Diagnosis not present

## 2021-09-21 DIAGNOSIS — I4891 Unspecified atrial fibrillation: Secondary | ICD-10-CM

## 2021-09-21 DIAGNOSIS — I5031 Acute diastolic (congestive) heart failure: Secondary | ICD-10-CM | POA: Diagnosis not present

## 2021-09-21 DIAGNOSIS — I480 Paroxysmal atrial fibrillation: Secondary | ICD-10-CM | POA: Diagnosis not present

## 2021-09-21 DIAGNOSIS — J441 Chronic obstructive pulmonary disease with (acute) exacerbation: Secondary | ICD-10-CM | POA: Diagnosis not present

## 2021-09-21 LAB — BASIC METABOLIC PANEL
Anion gap: 6 (ref 5–15)
BUN: 17 mg/dL (ref 8–23)
CO2: 34 mmol/L — ABNORMAL HIGH (ref 22–32)
Calcium: 8.1 mg/dL — ABNORMAL LOW (ref 8.9–10.3)
Chloride: 98 mmol/L (ref 98–111)
Creatinine, Ser: 0.94 mg/dL (ref 0.61–1.24)
GFR, Estimated: >60 mL/min (ref >60)
Glucose, Bld: 125 mg/dL — ABNORMAL HIGH (ref 70–99)
Potassium: 2.9 mmol/L — ABNORMAL LOW (ref 3.5–5.1)
Sodium: 138 mmol/L (ref 135–145)

## 2021-09-21 MED ORDER — POTASSIUM CHLORIDE CRYS ER 20 MEQ PO TBCR: 40.0000 meq | EXTENDED_RELEASE_TABLET | Freq: Every day | ORAL | Status: DC

## 2021-09-21 MED ORDER — ACETAMINOPHEN 325 MG PO TABS: 650.0000 mg | ORAL_TABLET | Freq: Four times a day (QID) | ORAL | Status: DC | PRN

## 2021-09-21 MED ORDER — POTASSIUM CHLORIDE CRYS ER 20 MEQ PO TBCR
40.0000 meq | EXTENDED_RELEASE_TABLET | Freq: Once | ORAL | Status: AC
  Administered 2021-09-21: 40 meq via ORAL
  Filled 2021-09-21: qty 2

## 2021-09-21 NOTE — Progress Notes (Addendum)
PROGRESS NOTE  Luis Salinas JXB:[id number] DOB: 11-26-1946 DOA: 09/19/2021 PCP: Dettinger, Elige Radon, MD   Brief History:  75 year old male with a history of diabetes mellitus type 2, stroke, foot artery disease/MI, COPD, hypertension, tobacco abuse presenting with 5-day history of shortness of breath, worsening lower extremity edema, increased abdominal girth, and orthopnea.  The patient states that he has had to sleep in a recliner for the last 2 weeks because of orthopnea.  He denies any fevers, chills, headache, neck pain, chest pain, nausea, vomiting, diarrhea, abdominal pain, dysuria, hematuria, hematochezia, melena.  He has had a cough with yellow sputum. The patient was on 2 L nasal cannula at home which was increased to 3 L by his PCP on the day prior to admission.  He continued to have shortness of breath.  As result, EMS was activated.  The patient was found to have oxygen saturation in the mid 80s on 3 L.   In the emergency department, the patient was afebrile hemodynamically stable with oxygen saturation in the low 90s on 6 L.  Chest x-ray showed increased interstitial markings and right greater than left pleural effusion.  BMP showed sodium 141, potassium 2.7, serum creatinine 1.10.  LFTs unremarkable.  WBC 7.0, hemoglobin 14.3, platelets 128,000.  The patient was started on intravenous furosemide.   Assessment/Plan: Acute diastolic CHF -Patient remains fluid overloaded -Echo--60-65%, WMA; G2DD, PASP 54.9 -Continue intravenous furosemide 40 mg twice daily -Accurate I's and O's -UO--3200 cc out in last 24   Acute on chronic respiratory failure with hypoxia -Secondary to pulmonary edema and COPD exacerbation -Chronically on 2 L nasal cannula -Currently on 6 L nasal cannula -Wean oxygen back to baseline   Atrial fibrillation, new onset, type unspecified -Personally reviewed EKG--atrial fibrillation with nonspecific T wave changes -CHADSVASc = 8 -Start  apixaban -rate controlled, continue bystolic   COPD exacerbation -Continue duo nebs -Continue Pulmicort -Continue  IV Solu-Medrol   Thrombocytopenia -Likely due to chronic hepatic congestion -Serum B12--315 -Folic acid--12.2 -TSH 6.72; Free T4--1.02   Coronary artery disease -11/19/2017 cath--100% L-Cx s/p BMS -no chest pain presently -personally reviewed EKG--sinus, nonspecific TWI   Essential hypertension -continue bystolic   Diabetes mellitus type 2 -05/29/2020 hemoglobin A1c 6.3 -05/23/2021 hemoglobin A1c 6.0 -Patient is not on any agents in outpatient setting   Hypokalemia -Repleted -start daily KCl -Check magnesium 2.4   Tobacco abuse -Tobacco cessation discussed   Marijuana dependence -Cessation discussed   Hyperlipidemia -Continue statin               Status is: Inpatient   Remains inpatient appropriate because:Inpatient level of care appropriate due to severity of illness   Dispo: The patient is from: Home              Anticipated d/c is to: Home              Patient currently is not medically stable to d/c.              Difficult to place patient No               Family Communication:   spouse updated 10/9   Consultants:  none   Code Status:  FULL    DVT Prophylaxis:  apixaban     Procedures: As Listed in Progress Note Above   Antibiotics: None  Total time spent 35 minutes.  Greater than 50% spent face to face counseling and coordinating  care.    Subjective: Patient is breathing better, but still having some orthopnea.  Denies f/c, cp, n/v/d, abd pain, headache  Objective: Vitals:   09/21/21 0200 09/21/21 0500 09/21/21 0717 09/21/21 0800  BP:      Pulse: 90  (!) 134   Resp: 20  18   Temp:  98.5 F (36.9 C) 97.6 F (36.4 C)   TempSrc:  Oral Oral   SpO2: 90%  90% 91%  Weight:  69.1 kg    Height:        Intake/Output Summary (Last 24 hours) at 09/21/2021 0954 Last data filed at 09/21/2021 0717 Gross per 24 hour   Intake --  Output 4000 ml  Net -4000 ml   Weight change: 1.061 kg Exam:  General:  Pt is alert, follows commands appropriately, not in acute distress HEENT: No icterus, No thrush, No neck mass, [place]/AT Cardiovascular: IRRR, S1/S2, no rubs, no gallops Respiratory: bilateral rales.  Bilateral wheeze Abdomen: Soft/+BS, non tender, non distended, no guarding Extremities: No edema, No lymphangitis, No petechiae, No rashes, no synovitis   Data Reviewed: I have personally reviewed following labs and imaging studies Basic Metabolic Panel: Recent Labs  Lab 09/19/21 1718 09/19/21 1921 09/20/21 0523 09/21/21 0515  NA 141  --  142 138  K 2.7*  --  3.2* 2.9*  CL 102  --  99 98  CO2 33*  --  36* 34*  GLUCOSE 112*  --  100* 125*  BUN 14  --  13 17  CREATININE 1.10  --  1.13 0.94  CALCIUM 8.1*  --  8.2* 8.1*  MG  --  2.7* 2.4  --   PHOS  --   --  2.8  --    Liver Function Tests: Recent Labs  Lab 09/19/21 1718 09/20/21 0523  AST 24 27  ALT 19 19  ALKPHOS 106 106  BILITOT 0.9 1.0  PROT 6.1* 6.5  ALBUMIN 3.0* 3.1*   No results for input(s): LIPASE, AMYLASE in the last 168 hours. No results for input(s): AMMONIA in the last 168 hours. Coagulation Profile: Recent Labs  Lab 09/19/21 1718 09/20/21 0523  INR 1.1 1.2   CBC: Recent Labs  Lab 09/19/21 1718 09/20/21 0523  WBC 7.0 7.8  NEUTROABS 5.0  --   HGB 14.3 14.8  HCT 45.4 49.1  MCV 91.3 92.1  PLT 128* 123*   Cardiac Enzymes: No results for input(s): CKTOTAL, CKMB, CKMBINDEX, TROPONINI in the last 168 hours. BNP: Invalid input(s): POCBNP CBG: No results for input(s): GLUCAP in the last 168 hours. HbA1C: Recent Labs    09/20/21 0712  HGBA1C 6.0*   Urine analysis:    Component Value Date/Time   COLORURINE STRAW (A) 09/19/2021 2115   APPEARANCEUR CLEAR 09/19/2021 2115   LABSPEC 1.004 (L) 09/19/2021 2115   PHURINE 8.0 09/19/2021 2115   GLUCOSEU NEGATIVE 09/19/2021 2115   HGBUR SMALL (A) 09/19/2021 2115    BILIRUBINUR NEGATIVE 09/19/2021 2115   KETONESUR NEGATIVE 09/19/2021 2115   PROTEINUR NEGATIVE 09/19/2021 2115   UROBILINOGEN 0.2 03/17/2013 0619   NITRITE NEGATIVE 09/19/2021 2115   LEUKOCYTESUR NEGATIVE 09/19/2021 2115   Sepsis Labs: @LABRCNTIP (procalcitonin:4,lacticidven:4) ) Recent Results (from the past 240 hour(s))  Resp Panel by RT-PCR (Flu A&B, Covid) Nasopharyngeal Swab     Status: None   Collection Time: 09/19/21  6:07 PM   Specimen: Nasopharyngeal Swab; Nasopharyngeal(NP) swabs in vial transport medium  Result Value Ref Range Status   SARS Coronavirus 2 by RT  PCR NEGATIVE NEGATIVE Final    Comment: (NOTE) SARS-CoV-2 target nucleic acids are NOT DETECTED.  The SARS-CoV-2 RNA is generally detectable in upper respiratory specimens during the acute phase of infection. The lowest concentration of SARS-CoV-2 viral copies this assay can detect is 138 copies/mL. A negative result does not preclude SARS-Cov-2 infection and should not be used as the sole basis for treatment or other patient management decisions. A negative result may occur with  improper specimen collection/handling, submission of specimen other than nasopharyngeal swab, presence of viral mutation(s) within the areas targeted by this assay, and inadequate number of viral copies(<138 copies/mL). A negative result must be combined with clinical observations, patient history, and epidemiological information. The expected result is Negative.  Fact Sheet for Patients:  BloggerCourse.com  Fact Sheet for Healthcare Providers:  SeriousBroker.it  This test is no t yet approved or cleared by the [place] FDA and  has been authorized for detection and/or diagnosis of SARS-CoV-2 by FDA under an Emergency Use Authorization (EUA). This EUA will remain  in effect (meaning this test can be used) for the duration of the COVID-19 declaration under Section 564(b)(1) of the  Act, 21 U.S.C.section 360bbb-3(b)(1), unless the authorization is terminated  or revoked sooner.       Influenza A by PCR NEGATIVE NEGATIVE Final   Influenza B by PCR NEGATIVE NEGATIVE Final    Comment: (NOTE) The Xpert Xpress SARS-CoV-2/FLU/RSV plus assay is intended as an aid in the diagnosis of influenza from Nasopharyngeal swab specimens and should not be used as a sole basis for treatment. Nasal washings and aspirates are unacceptable for Xpert Xpress SARS-CoV-2/FLU/RSV testing.  Fact Sheet for Patients: BloggerCourse.com  Fact Sheet for Healthcare Providers: SeriousBroker.it  This test is not yet approved or cleared by the [place] FDA and has been authorized for detection and/or diagnosis of SARS-CoV-2 by FDA under an Emergency Use Authorization (EUA). This EUA will remain in effect (meaning this test can be used) for the duration of the COVID-19 declaration under Section 564(b)(1) of the Act, 21 U.S.C. section 360bbb-3(b)(1), unless the authorization is terminated or revoked.  Performed at [facility], [address], [place], [place] [address]   MRSA Next Gen by PCR, Nasal     Status: None   Collection Time: 09/20/21  5:54 AM   Specimen: Nasal Mucosa; Nasal Swab  Result Value Ref Range Status   MRSA by PCR Next Gen NOT DETECTED NOT DETECTED Final    Comment: (NOTE) The GeneXpert MRSA Assay (FDA approved for NASAL specimens only), is one component of a comprehensive MRSA colonization surveillance program. It is not intended to diagnose MRSA infection nor to guide or monitor treatment for MRSA infections. Test performance is not FDA approved in patients less than 62 years old. Performed at [facility], [address] [place], [place] [address]      Scheduled Meds:  apixaban  5 mg Oral BID   budesonide (PULMICORT) nebulizer solution  0.5 mg Nebulization BID   Chlorhexidine Gluconate Cloth  6 each Topical  Daily   dipyridamole-aspirin  1 capsule Oral BID   feeding supplement  237 mL Oral BID BM   furosemide  40 mg Intravenous Q12H   ipratropium-albuterol  3 mL Nebulization BID   methylPREDNISolone (SOLU-MEDROL) injection  40 mg Intravenous Q12H   multivitamin with minerals  1 tablet Oral Daily   nebivolol  5 mg Oral Daily   [START ON 09/22/2021] potassium chloride  40 mEq Oral Daily  potassium chloride  40 mEq Oral Once   rosuvastatin  40 mg Oral Daily   Continuous Infusions:  Procedures/Studies: DG Chest Port 1 View  Result Date: 09/19/2021 CLINICAL DATA:  Shortness of breath EXAM: PORTABLE CHEST 1 VIEW COMPARISON:  03/16/2013 FINDINGS: Hardware in the cervical spine. Cardiomegaly with vascular congestion and interstitial pulmonary edema. There is underlying emphysema. Small left and small moderate right pleural effusion. Basilar airspace disease. Aortic atherosclerosis. IMPRESSION: 1. Cardiomegaly with vascular congestion, interstitial edema and right greater than left pleural effusion 2. Underlying emphysema. Airspace disease at the bases may reflect atelectasis or pneumonia Electronically Signed   By: Jasmine Pang M.D.   On: 09/19/2021 17:46   ECHOCARDIOGRAM COMPLETE  Result Date: 09/20/2021    ECHOCARDIOGRAM REPORT   Patient Name:   Luis Salinas Date of Exam: 09/20/2021 Medical Rec #:  [id number]      Height:       69.0 in Accession #:    [id number]     Weight:       154.1 lb Date of Birth:  07/05/1946     BSA:          1.849 m Patient Age:    74 years       BP:           106/54 mmHg Patient Gender: M              HR:           93 bpm. [facility] Procedure: 2D Echo, Cardiac Doppler and Color Doppler Indications:    CHF-Acute Diastolic  History:        Patient has prior history of Echocardiogram examinations, most                 recent 02/21/2018. CHF, CAD, TIA and COPD, Arrythmias:Atrial                 Fibrillation; Risk Factors:Hypertension and Dyslipidemia.                  Stents, Tobacco abuse.  Sonographer:    Mikki Harbor Referring Phys: [id number] OLADAPO ADEFESO IMPRESSIONS  1. Left ventricular ejection fraction, by estimation, is 60 to 65%. The left ventricle has normal function. The left ventricle has no regional wall motion abnormalities. There is mild left ventricular hypertrophy. Left ventricular diastolic parameters are consistent with Grade II diastolic dysfunction (pseudonormalization). There is the interventricular septum is flattened in systole and diastole, consistent with right ventricular pressure and volume overload.  2. Right ventricular systolic function is moderately reduced. The right ventricular size is normal. There is moderately elevated pulmonary artery systolic pressure.  3. Left atrial size was severely dilated.  4. Right atrial size was moderately dilated.  5. The mitral valve is normal in structure. No evidence of mitral valve regurgitation. No evidence of mitral stenosis.  6. The aortic valve is tricuspid. There is moderate calcification of the aortic valve. There is moderate thickening of the aortic valve. Aortic valve regurgitation is not visualized. Mild to moderate aortic valve stenosis.  7. The inferior vena cava is dilated in size with <50% respiratory variability, suggesting right atrial pressure of 15 mmHg. FINDINGS  Left Ventricle: Left ventricular ejection fraction, by estimation, is 60 to 65%. The left ventricle has normal function. The left ventricle has no regional wall motion abnormalities. The left ventricular internal cavity size was normal in size. There is  mild left ventricular hypertrophy. The interventricular septum is  flattened in systole and diastole, consistent with right ventricular pressure and volume overload. Left ventricular diastolic parameters are consistent with Grade II diastolic dysfunction  (pseudonormalization). Right Ventricle: The right ventricular size is normal. No increase in right ventricular wall thickness.  Right ventricular systolic function is moderately reduced. There is moderately elevated pulmonary artery systolic pressure. The tricuspid regurgitant velocity is 3.16 m/s, and with an assumed right atrial pressure of 15 mmHg, the estimated right ventricular systolic pressure is 54.9 mmHg. Left Atrium: Left atrial size was severely dilated. Right Atrium: Right atrial size was moderately dilated. Pericardium: There is no evidence of pericardial effusion. Mitral Valve: The mitral valve is normal in structure. No evidence of mitral valve regurgitation. No evidence of mitral valve stenosis. MV peak gradient, 5.1 mmHg. The mean mitral valve gradient is 2.0 mmHg. Tricuspid Valve: The tricuspid valve is normal in structure. Tricuspid valve regurgitation is mild . No evidence of tricuspid stenosis. Aortic Valve: The aortic valve is tricuspid. There is moderate calcification of the aortic valve. There is moderate thickening of the aortic valve. Aortic valve regurgitation is not visualized. Mild to moderate aortic stenosis is present. Aortic valve mean gradient measures 4.7 mmHg. Aortic valve peak gradient measures 8.6 mmHg. Aortic valve area, by VTI measures 2.37 cm. Pulmonic Valve: The pulmonic valve was normal in structure. Pulmonic valve regurgitation is not visualized. No evidence of pulmonic stenosis. Aorta: The aortic root is normal in size and structure. Venous: The inferior vena cava is dilated in size with less than 50% respiratory variability, suggesting right atrial pressure of 15 mmHg. IAS/Shunts: No atrial level shunt detected by color flow Doppler. Additional Comments: There is a small pleural effusion in the left lateral region.  LEFT VENTRICLE PLAX 2D LVIDd:         3.60 cm   Diastology LVIDs:         2.70 cm   LV e' medial:    7.94 cm/s LV PW:         1.30 cm   LV E/e' medial:  16.8 LV IVS:        1.20 cm   LV e' lateral:   6.53 cm/s LVOT diam:     2.00 cm   LV E/e' lateral: 20.4 LV SV:         60 LV SV  Index:   32 LVOT Area:     3.14 cm  RIGHT VENTRICLE RV Basal diam:  3.55 cm RV Mid diam:    3.40 cm RV S prime:     16.50 cm/s TAPSE (M-mode): 2.2 cm LEFT ATRIUM              Index        RIGHT ATRIUM           Index LA diam:        5.10 cm  2.76 cm/m   RA Area:     18.00 cm LA Vol (A2C):   115.0 ml 62.19 ml/m  RA Volume:   49.60 ml  26.82 ml/m LA Vol (A4C):   78.7 ml  42.56 ml/m LA Biplane Vol: 95.5 ml  51.64 ml/m  AORTIC VALVE                    PULMONIC VALVE AV Area (Vmax):    2.28 cm     PV Vmax:       0.64 m/s AV Area (Vmean):   2.35 cm     PV Peak grad:  1.7 mmHg AV Area (VTI):     2.37 cm AV Vmax:           146.33 cm/s AV Vmean:          97.300 cm/s AV VTI:            0.253 m AV Peak Grad:      8.6 mmHg AV Mean Grad:      4.7 mmHg LVOT Vmax:         106.00 cm/s LVOT Vmean:        72.800 cm/s LVOT VTI:          0.191 m LVOT/AV VTI ratio: 0.76  AORTA Ao Root diam: 3.60 cm Ao Asc diam:  3.50 cm MITRAL VALVE                TRICUSPID VALVE MV Area (PHT): 5.13 cm     TR Peak grad:   39.9 mmHg MV Area VTI:   2.67 cm     TR Vmax:        316.00 cm/s MV Peak grad:  5.1 mmHg MV Mean grad:  2.0 mmHg     SHUNTS MV Vmax:       1.13 m/s     Systemic VTI:  0.19 m MV Vmean:      58.9 cm/s    Systemic Diam: 2.00 cm MV Decel Time: 148 msec MV E velocity: 133.00 cm/s Donato Schultz MD Electronically signed by Donato Schultz MD Signature Date/Time: 09/20/2021/1:54:35 PM    Final     Catarina Hartshorn, DO  [facility]  If 7PM-7AM, please contact night-coverage [email or web address] Password [email or web address] 09/21/2021, 9:54 AM   LOS: 2 days

## 2021-09-22 DIAGNOSIS — I10 Essential (primary) hypertension: Secondary | ICD-10-CM | POA: Diagnosis not present

## 2021-09-22 DIAGNOSIS — I480 Paroxysmal atrial fibrillation: Secondary | ICD-10-CM

## 2021-09-22 DIAGNOSIS — E876 Hypokalemia: Secondary | ICD-10-CM

## 2021-09-22 DIAGNOSIS — I2510 Atherosclerotic heart disease of native coronary artery without angina pectoris: Secondary | ICD-10-CM | POA: Diagnosis not present

## 2021-09-22 DIAGNOSIS — I5033 Acute on chronic diastolic (congestive) heart failure: Secondary | ICD-10-CM | POA: Diagnosis not present

## 2021-09-22 DIAGNOSIS — J441 Chronic obstructive pulmonary disease with (acute) exacerbation: Secondary | ICD-10-CM | POA: Diagnosis not present

## 2021-09-22 DIAGNOSIS — I5031 Acute diastolic (congestive) heart failure: Secondary | ICD-10-CM | POA: Diagnosis not present

## 2021-09-22 LAB — BASIC METABOLIC PANEL
Anion gap: 6 (ref 5–15)
BUN: 24 mg/dL — ABNORMAL HIGH (ref 8–23)
CO2: 34 mmol/L — ABNORMAL HIGH (ref 22–32)
Calcium: 8.2 mg/dL — ABNORMAL LOW (ref 8.9–10.3)
Chloride: 97 mmol/L — ABNORMAL LOW (ref 98–111)
Creatinine, Ser: 0.99 mg/dL (ref 0.61–1.24)
GFR, Estimated: >60 mL/min (ref >60)
Glucose, Bld: 152 mg/dL — ABNORMAL HIGH (ref 70–99)
Potassium: 3.3 mmol/L — ABNORMAL LOW (ref 3.5–5.1)
Sodium: 137 mmol/L (ref 135–145)

## 2021-09-22 LAB — CBC
HCT: 42.6 % (ref 39.0–52.0)
Hemoglobin: 13.5 g/dL (ref 13.0–17.0)
MCH: 28.7 pg (ref 26.0–34.0)
MCHC: 31.7 g/dL (ref 30.0–36.0)
MCV: 90.6 fL (ref 80.0–100.0)
Platelets: 130 10*3/uL — ABNORMAL LOW (ref 150–400)
RBC: 4.7 MIL/uL (ref 4.22–5.81)
RDW: 16.3 % — ABNORMAL HIGH (ref 11.5–15.5)
WBC: 10.5 10*3/uL (ref 4.0–10.5)
nRBC: 0 % (ref 0.0–0.2)

## 2021-09-22 LAB — MAGNESIUM: Magnesium: 2.2 mg/dL (ref 1.7–2.4)

## 2021-09-22 MED ORDER — POTASSIUM CHLORIDE CRYS ER 20 MEQ PO TBCR
40.0000 meq | EXTENDED_RELEASE_TABLET | Freq: Two times a day (BID) | ORAL | Status: DC
  Administered 2021-09-22 – 2021-09-23 (×4): 40 meq via ORAL
  Filled 2021-09-22 (×4): qty 2

## 2021-09-22 MED ORDER — PROCHLORPERAZINE EDISYLATE 10 MG/2ML IJ SOLN: 5.0000 mg | Freq: Four times a day (QID) | INTRAMUSCULAR | Status: DC | PRN

## 2021-09-22 MED ORDER — ARFORMOTEROL TARTRATE 15 MCG/2ML IN NEBU
15.0000 ug | INHALATION_SOLUTION | Freq: Two times a day (BID) | RESPIRATORY_TRACT | Status: DC
  Administered 2021-09-22 – 2021-09-24 (×4): 15 ug via RESPIRATORY_TRACT
  Filled 2021-09-22 (×4): qty 2

## 2021-09-22 NOTE — Progress Notes (Signed)
PROGRESS NOTE  Luis Salinas CHY:[id number] DOB: 12/19/1945 DOA: 09/19/2021 PCP: Dettinger, Elige Radon, MD   Brief History:  75 year old male with a history of diabetes mellitus type 2, stroke, foot artery disease/MI, COPD, hypertension, tobacco abuse presenting with 5-day history of shortness of breath, worsening lower extremity edema, increased abdominal girth, and orthopnea.  The patient states that he has had to sleep in a recliner for the last 2 weeks because of orthopnea.  He denies any fevers, chills, headache, neck pain, chest pain, nausea, vomiting, diarrhea, abdominal pain, dysuria, hematuria, hematochezia, melena.  He has had a cough with yellow sputum. The patient was on 2 L nasal cannula at home which was increased to 3 L by his PCP on the day prior to admission.  He continued to have shortness of breath.  As result, EMS was activated.  The patient was found to have oxygen saturation in the mid 80s on 3 L.   In the emergency department, the patient was afebrile hemodynamically stable with oxygen saturation in the low 90s on 6 L.  Chest x-ray showed increased interstitial markings and right greater than left pleural effusion.  BMP showed sodium 141, potassium 2.7, serum creatinine 1.10.  LFTs unremarkable.  WBC 7.0, hemoglobin 14.3, platelets 128,000.  The patient was started on intravenous furosemide.   Assessment/Plan: Acute diastolic CHF -Patient remains fluid overloaded -Echo--60-65%, WMA; G2DD, PASP 54.9 -Continue intravenous furosemide 40 mg twice daily -Accurate I's and O's   Acute on chronic respiratory failure with hypoxia -Secondary to pulmonary edema and COPD exacerbation -Chronically on 2 L nasal cannula -Currently on 6 L nasal cannula -Wean oxygen back to baseline   Atrial fibrillation, new onset, type unspecified -Personally reviewed EKG--atrial fibrillation with nonspecific T wave changes -CHADSVASc = 8 -Continue apixaban -rate controlled, continue  bystolic   COPD exacerbation -Continue duo nebs -Continue Pulmicort -Continue  IV Solu-Medrol -add Brovana   Thrombocytopenia -Likely due to chronic hepatic congestion -Serum B12--315 -Folic acid--12.2 -TSH 6.72; Free T4--1.02   Coronary artery disease -11/19/2017 cath--100% L-Cx s/p BMS -no chest pain presently -personally reviewed EKG--sinus, nonspecific TWI   Essential hypertension -continue bystolic   Diabetes mellitus type 2 -05/29/2020 hemoglobin A1c 6.3 -05/23/2021 hemoglobin A1c 6.0 -09/20/2021 A1C--6.0 -Patient is not on any agents in outpatient setting   Hypokalemia -Repleted -Continue daily KCl -Check magnesium 2.4   Tobacco abuse -Tobacco cessation discussed   Marijuana dependence -Cessation discussed   Hyperlipidemia -Continue statin               Status is: Inpatient   Remains inpatient appropriate because:Inpatient level of care appropriate due to severity of illness   Dispo: The patient is from: Home              Anticipated d/c is to: Home              Patient currently is not medically stable to d/c.              Difficult to place patient No               Family Communication:   spouse updated 10/9   Consultants:  none   Code Status:  FULL    DVT Prophylaxis:  apixaban     Procedures: As Listed in Progress Note Above   Antibiotics: None    Subjective: Patient continues to breath better.  Denies cp, n/v/d, abd pain, f/c  Objective: Vitals:  09/22/21 0231 09/22/21 0815 09/22/21 0819 09/22/21 1410  BP:    122/73  Pulse: 91   77  Resp:    17  Temp:    98.3 F (36.8 C)  TempSrc:    Oral  SpO2:  95% 95% 96%  Weight: 69 kg     Height:        Intake/Output Summary (Last 24 hours) at 09/22/2021 1658 Last data filed at 09/22/2021 0848 Gross per 24 hour  Intake 480 ml  Output 150 ml  Net 330 ml   Weight change: -0.1 kg Exam:  General:  Pt is alert, follows commands appropriately, not in acute  distress HEENT: No icterus, No thrush, No neck mass, [place]/AT Cardiovascular: IRRR, S1/S2, no rubs, no gallops Respiratory: bilateral rales.  Bibasilar wheeze Abdomen: Soft/+BS, non tender, non distended, no guarding Extremities: 1+LE edema, No lymphangitis, No petechiae, No rashes, no synovitis   Data Reviewed: I have personally reviewed following labs and imaging studies Basic Metabolic Panel: Recent Labs  Lab 09/19/21 1718 09/19/21 1921 09/20/21 0523 09/21/21 0515 09/22/21 0352  NA 141  --  142 138 137  K 2.7*  --  3.2* 2.9* 3.3*  CL 102  --  99 98 97*  CO2 33*  --  36* 34* 34*  GLUCOSE 112*  --  100* 125* 152*  BUN 14  --  13 17 24*  CREATININE 1.10  --  1.13 0.94 0.99  CALCIUM 8.1*  --  8.2* 8.1* 8.2*  MG  --  2.7* 2.4  --  2.2  PHOS  --   --  2.8  --   --    Liver Function Tests: Recent Labs  Lab 09/19/21 1718 09/20/21 0523  AST 24 27  ALT 19 19  ALKPHOS 106 106  BILITOT 0.9 1.0  PROT 6.1* 6.5  ALBUMIN 3.0* 3.1*   No results for input(s): LIPASE, AMYLASE in the last 168 hours. No results for input(s): AMMONIA in the last 168 hours. Coagulation Profile: Recent Labs  Lab 09/19/21 1718 09/20/21 0523  INR 1.1 1.2   CBC: Recent Labs  Lab 09/19/21 1718 09/20/21 0523 09/22/21 0352  WBC 7.0 7.8 10.5  NEUTROABS 5.0  --   --   HGB 14.3 14.8 13.5  HCT 45.4 49.1 42.6  MCV 91.3 92.1 90.6  PLT 128* 123* 130*   Cardiac Enzymes: No results for input(s): CKTOTAL, CKMB, CKMBINDEX, TROPONINI in the last 168 hours. BNP: Invalid input(s): POCBNP CBG: No results for input(s): GLUCAP in the last 168 hours. HbA1C: Recent Labs    09/20/21 0712  HGBA1C 6.0*   Urine analysis:    Component Value Date/Time   COLORURINE STRAW (A) 09/19/2021 2115   APPEARANCEUR CLEAR 09/19/2021 2115   LABSPEC 1.004 (L) 09/19/2021 2115   PHURINE 8.0 09/19/2021 2115   GLUCOSEU NEGATIVE 09/19/2021 2115   HGBUR SMALL (A) 09/19/2021 2115   BILIRUBINUR NEGATIVE 09/19/2021 2115    KETONESUR NEGATIVE 09/19/2021 2115   PROTEINUR NEGATIVE 09/19/2021 2115   UROBILINOGEN 0.2 03/17/2013 0619   NITRITE NEGATIVE 09/19/2021 2115   LEUKOCYTESUR NEGATIVE 09/19/2021 2115   Sepsis Labs: @LABRCNTIP (procalcitonin:4,lacticidven:4) ) Recent Results (from the past 240 hour(s))  Resp Panel by RT-PCR (Flu A&B, Covid) Nasopharyngeal Swab     Status: None   Collection Time: 09/19/21  6:07 PM   Specimen: Nasopharyngeal Swab; Nasopharyngeal(NP) swabs in vial transport medium  Result Value Ref Range Status   SARS Coronavirus 2 by RT PCR NEGATIVE NEGATIVE Final  Comment: (NOTE) SARS-CoV-2 target nucleic acids are NOT DETECTED.  The SARS-CoV-2 RNA is generally detectable in upper respiratory specimens during the acute phase of infection. The lowest concentration of SARS-CoV-2 viral copies this assay can detect is 138 copies/mL. A negative result does not preclude SARS-Cov-2 infection and should not be used as the sole basis for treatment or other patient management decisions. A negative result may occur with  improper specimen collection/handling, submission of specimen other than nasopharyngeal swab, presence of viral mutation(s) within the areas targeted by this assay, and inadequate number of viral copies(<138 copies/mL). A negative result must be combined with clinical observations, patient history, and epidemiological information. The expected result is Negative.  Fact Sheet for Patients:  BloggerCourse.com  Fact Sheet for Healthcare Providers:  SeriousBroker.it  This test is no t yet approved or cleared by the [place] FDA and  has been authorized for detection and/or diagnosis of SARS-CoV-2 by FDA under an Emergency Use Authorization (EUA). This EUA will remain  in effect (meaning this test can be used) for the duration of the COVID-19 declaration under Section 564(b)(1) of the Act, 21 U.S.C.section 360bbb-3(b)(1),  unless the authorization is terminated  or revoked sooner.       Influenza A by PCR NEGATIVE NEGATIVE Final   Influenza B by PCR NEGATIVE NEGATIVE Final    Comment: (NOTE) The Xpert Xpress SARS-CoV-2/FLU/RSV plus assay is intended as an aid in the diagnosis of influenza from Nasopharyngeal swab specimens and should not be used as a sole basis for treatment. Nasal washings and aspirates are unacceptable for Xpert Xpress SARS-CoV-2/FLU/RSV testing.  Fact Sheet for Patients: BloggerCourse.com  Fact Sheet for Healthcare Providers: SeriousBroker.it  This test is not yet approved or cleared by the [place] FDA and has been authorized for detection and/or diagnosis of SARS-CoV-2 by FDA under an Emergency Use Authorization (EUA). This EUA will remain in effect (meaning this test can be used) for the duration of the COVID-19 declaration under Section 564(b)(1) of the Act, 21 U.S.C. section 360bbb-3(b)(1), unless the authorization is terminated or revoked.  Performed at [facility], [address], [place], [place] [address]   MRSA Next Gen by PCR, Nasal     Status: None   Collection Time: 09/20/21  5:54 AM   Specimen: Nasal Mucosa; Nasal Swab  Result Value Ref Range Status   MRSA by PCR Next Gen NOT DETECTED NOT DETECTED Final    Comment: (NOTE) The GeneXpert MRSA Assay (FDA approved for NASAL specimens only), is one component of a comprehensive MRSA colonization surveillance program. It is not intended to diagnose MRSA infection nor to guide or monitor treatment for MRSA infections. Test performance is not FDA approved in patients less than 56 years old. Performed at [facility], [address], [place], [place] [address]      Scheduled Meds:  apixaban  5 mg Oral BID   arformoterol  15 mcg Nebulization BID   budesonide (PULMICORT) nebulizer solution  0.5 mg Nebulization BID   Chlorhexidine Gluconate Cloth  6 each Topical  Daily   dipyridamole-aspirin  1 capsule Oral BID   feeding supplement  237 mL Oral BID BM   furosemide  40 mg Intravenous Q12H   ipratropium-albuterol  3 mL Nebulization BID   methylPREDNISolone (SOLU-MEDROL) injection  40 mg Intravenous Q12H   multivitamin with minerals  1 tablet Oral Daily   nebivolol  5 mg Oral Daily   potassium chloride  40 mEq Oral BID   rosuvastatin  40 mg Oral Daily   Continuous Infusions:  Procedures/Studies: DG Chest Port 1 View  Result Date: 09/19/2021 CLINICAL DATA:  Shortness of breath EXAM: PORTABLE CHEST 1 VIEW COMPARISON:  03/16/2013 FINDINGS: Hardware in the cervical spine. Cardiomegaly with vascular congestion and interstitial pulmonary edema. There is underlying emphysema. Small left and small moderate right pleural effusion. Basilar airspace disease. Aortic atherosclerosis. IMPRESSION: 1. Cardiomegaly with vascular congestion, interstitial edema and right greater than left pleural effusion 2. Underlying emphysema. Airspace disease at the bases may reflect atelectasis or pneumonia Electronically Signed   By: Jasmine Pang M.D.   On: 09/19/2021 17:46   ECHOCARDIOGRAM COMPLETE  Result Date: 09/20/2021    ECHOCARDIOGRAM REPORT   Patient Name:   CARLOSDANIEL GROB Date of Exam: 09/20/2021 Medical Rec #:  [id number]      Height:       69.0 in Accession #:    [id number]     Weight:       154.1 lb Date of Birth:  09-03-1946     BSA:          1.849 m Patient Age:    74 years       BP:           106/54 mmHg Patient Gender: M              HR:           93 bpm. [facility] Procedure: 2D Echo, Cardiac Doppler and Color Doppler Indications:    CHF-Acute Diastolic  History:        Patient has prior history of Echocardiogram examinations, most                 recent 02/21/2018. CHF, CAD, TIA and COPD, Arrythmias:Atrial                 Fibrillation; Risk Factors:Hypertension and Dyslipidemia.                 Stents, Tobacco abuse.  Sonographer:    Mikki Harbor  Referring Phys: [id number] OLADAPO ADEFESO IMPRESSIONS  1. Left ventricular ejection fraction, by estimation, is 60 to 65%. The left ventricle has normal function. The left ventricle has no regional wall motion abnormalities. There is mild left ventricular hypertrophy. Left ventricular diastolic parameters are consistent with Grade II diastolic dysfunction (pseudonormalization). There is the interventricular septum is flattened in systole and diastole, consistent with right ventricular pressure and volume overload.  2. Right ventricular systolic function is moderately reduced. The right ventricular size is normal. There is moderately elevated pulmonary artery systolic pressure.  3. Left atrial size was severely dilated.  4. Right atrial size was moderately dilated.  5. The mitral valve is normal in structure. No evidence of mitral valve regurgitation. No evidence of mitral stenosis.  6. The aortic valve is tricuspid. There is moderate calcification of the aortic valve. There is moderate thickening of the aortic valve. Aortic valve regurgitation is not visualized. Mild to moderate aortic valve stenosis.  7. The inferior vena cava is dilated in size with <50% respiratory variability, suggesting right atrial pressure of 15 mmHg. FINDINGS  Left Ventricle: Left ventricular ejection fraction, by estimation, is 60 to 65%. The left ventricle has normal function. The left ventricle has no regional wall motion abnormalities. The left ventricular internal cavity size was normal in size. There is  mild left ventricular hypertrophy. The interventricular septum is flattened in systole and diastole, consistent with right ventricular pressure and  volume overload. Left ventricular diastolic parameters are consistent with Grade II diastolic dysfunction  (pseudonormalization). Right Ventricle: The right ventricular size is normal. No increase in right ventricular wall thickness. Right ventricular systolic function is moderately reduced.  There is moderately elevated pulmonary artery systolic pressure. The tricuspid regurgitant velocity is 3.16 m/s, and with an assumed right atrial pressure of 15 mmHg, the estimated right ventricular systolic pressure is 54.9 mmHg. Left Atrium: Left atrial size was severely dilated. Right Atrium: Right atrial size was moderately dilated. Pericardium: There is no evidence of pericardial effusion. Mitral Valve: The mitral valve is normal in structure. No evidence of mitral valve regurgitation. No evidence of mitral valve stenosis. MV peak gradient, 5.1 mmHg. The mean mitral valve gradient is 2.0 mmHg. Tricuspid Valve: The tricuspid valve is normal in structure. Tricuspid valve regurgitation is mild . No evidence of tricuspid stenosis. Aortic Valve: The aortic valve is tricuspid. There is moderate calcification of the aortic valve. There is moderate thickening of the aortic valve. Aortic valve regurgitation is not visualized. Mild to moderate aortic stenosis is present. Aortic valve mean gradient measures 4.7 mmHg. Aortic valve peak gradient measures 8.6 mmHg. Aortic valve area, by VTI measures 2.37 cm. Pulmonic Valve: The pulmonic valve was normal in structure. Pulmonic valve regurgitation is not visualized. No evidence of pulmonic stenosis. Aorta: The aortic root is normal in size and structure. Venous: The inferior vena cava is dilated in size with less than 50% respiratory variability, suggesting right atrial pressure of 15 mmHg. IAS/Shunts: No atrial level shunt detected by color flow Doppler. Additional Comments: There is a small pleural effusion in the left lateral region.  LEFT VENTRICLE PLAX 2D LVIDd:         3.60 cm   Diastology LVIDs:         2.70 cm   LV e' medial:    7.94 cm/s LV PW:         1.30 cm   LV E/e' medial:  16.8 LV IVS:        1.20 cm   LV e' lateral:   6.53 cm/s LVOT diam:     2.00 cm   LV E/e' lateral: 20.4 LV SV:         60 LV SV Index:   32 LVOT Area:     3.14 cm  RIGHT VENTRICLE RV Basal  diam:  3.55 cm RV Mid diam:    3.40 cm RV S prime:     16.50 cm/s TAPSE (M-mode): 2.2 cm LEFT ATRIUM              Index        RIGHT ATRIUM           Index LA diam:        5.10 cm  2.76 cm/m   RA Area:     18.00 cm LA Vol (A2C):   115.0 ml 62.19 ml/m  RA Volume:   49.60 ml  26.82 ml/m LA Vol (A4C):   78.7 ml  42.56 ml/m LA Biplane Vol: 95.5 ml  51.64 ml/m  AORTIC VALVE                    PULMONIC VALVE AV Area (Vmax):    2.28 cm     PV Vmax:       0.64 m/s AV Area (Vmean):   2.35 cm     PV Peak grad:  1.7 mmHg AV Area (VTI):     2.37  cm AV Vmax:           146.33 cm/s AV Vmean:          97.300 cm/s AV VTI:            0.253 m AV Peak Grad:      8.6 mmHg AV Mean Grad:      4.7 mmHg LVOT Vmax:         106.00 cm/s LVOT Vmean:        72.800 cm/s LVOT VTI:          0.191 m LVOT/AV VTI ratio: 0.76  AORTA Ao Root diam: 3.60 cm Ao Asc diam:  3.50 cm MITRAL VALVE                TRICUSPID VALVE MV Area (PHT): 5.13 cm     TR Peak grad:   39.9 mmHg MV Area VTI:   2.67 cm     TR Vmax:        316.00 cm/s MV Peak grad:  5.1 mmHg MV Mean grad:  2.0 mmHg     SHUNTS MV Vmax:       1.13 m/s     Systemic VTI:  0.19 m MV Vmean:      58.9 cm/s    Systemic Diam: 2.00 cm MV Decel Time: 148 msec MV E velocity: 133.00 cm/s Donato Schultz MD Electronically signed by Donato Schultz MD Signature Date/Time: 09/20/2021/1:54:35 PM    Final     Catarina Hartshorn, DO  [facility]  If 7PM-7AM, please contact night-coverage [email or web address] Password [email or web address] 09/22/2021, 4:58 PM   LOS: 3 days

## 2021-09-22 NOTE — Consult Note (Signed)
Cardiology Consultation:   [name] MRN: [id number]; DOB: 06-30-1946  Admit date: 09/19/2021 Date of Consult: 09/22/2021  PCP:  Dettinger, Elige Radon, MD   [facility] Providers Cardiologist:  None        Patient Profile:   Luis Salinas is a 75 y.o. male with a hx of T2DM, CVA, CAD, COPD, hypertension, tobacco use who is being seen 09/22/2021 for the evaluation of heart failure at the request of Dr Tat.  History of Present Illness:   Luis Salinas is a 75 year old male with a history of T2DM, CVA, CAD, COPD, hypertension, tobacco use who was admitted on 10/7 with shortness of breath.  He reported worsening shortness of breath over the past week, along with lower extremity edema and orthopnea.  He reports was started on 2 L nasal cannula by PCP 2 weeks ago, but was increased to 3 L by his PCP on 10/6.  Given worsening shortness of breath, he called EMS.  Was found to have SPO2 in mid 80s on 3 L.  In the ED, he was hemodynamically stable, SPO2 in low 90s on 6 L.  Initial labs showed creatinine 1.1, potassium 2.7, sodium 141, normal LFTs, BNP 439, troponin 29 > 30.  EKG showed atrial fibrillation, rate 81, poor R wave progression, nonspecific T wave flattening, QTC 530.  Echocardiogram on 10/8 showed EF 60 to 65%, grade 2 diastolic dysfunction, interventricular septal flattening in systole and diastole is consistent with RV pressure and volume overload, moderate RV dysfunction, severe left atrial dilatation, moderate right atrial dilatation, mild to moderate aortic stenosis, RAP 15 mmHg.  He was started on diuresis with IV Lasix 40 mg twice daily.  He is net -7.8 L on admission.  Labs this morning showed stable creatinine (0.99), potassium 3.3.  Also being treated for COPD exacerbation, on IV Solu-Medrol, Pulmicort, duonebs.  He reports his dyspnea is significantly improved but remains short of breath.   Past Medical History:  Diagnosis Date   Amnesia    He was found and  reuintied with his family in 34 after 27 years.   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 His family found him after 27 years   Arthritis    Cataract    Concussion    hx of   COPD (chronic obstructive pulmonary disease) (HCC)    Myocardial infarction (HCC)    2007 [facility]   Pneumonia    exposure to caustic chemicals   Stroke [facility])    multiple strokes    Past Surgical History:  Procedure Laterality Date   ANTERIOR CERVICAL DECOMP/DISCECTOMY FUSION  05/16/2012   Procedure: ANTERIOR CERVICAL DECOMPRESSION/DISCECTOMY FUSION 2 LEVELS;  Surgeon: Barnett Abu, MD;  Location: [facility];  Service: Neurosurgery;  Laterality: N/A;  Cervical Five-Six, Cervical Six-Seven Anterior Cervical Decompression/Diskectomy Fusion   CARDIAC CATHETERIZATION     COLONOSCOPY W/ POLYPECTOMY     CORONARY ANGIOPLASTY     EYE SURGERY     cataracts   JOINT REPLACEMENT     Left knee replacement   JOINT REPLACEMENT     left elbow at age 58   ROTATOR CUFF REPAIR     left   TONSILLECTOMY         Inpatient Medications: Scheduled Meds:  apixaban  5 mg Oral BID   budesonide (PULMICORT) nebulizer solution  0.5 mg Nebulization BID   Chlorhexidine Gluconate Cloth  6 each Topical Daily   dipyridamole-aspirin  1 capsule Oral BID   feeding supplement  237 mL Oral BID BM   furosemide  40 mg Intravenous Q12H   ipratropium-albuterol  3 mL Nebulization BID   methylPREDNISolone (SOLU-MEDROL) injection  40 mg Intravenous Q12H   multivitamin with minerals  1 tablet Oral Daily   nebivolol  5 mg Oral Daily   potassium chloride  40 mEq Oral Daily   rosuvastatin  40 mg Oral Daily   Continuous Infusions:  PRN Meds: acetaminophen, nitroGLYCERIN  Allergies:   No Known Allergies  Social History:   Social History   Socioeconomic History   Marital status: Married    Spouse name: Luis Salinas   Number of children: 7   Years of education: college   Highest education level: Some college, no degree   Occupational History   Occupation: Retired    Comment: Proofreader  Tobacco Use   Smoking status: Light Smoker    Types: Cigars    Last attempt to quit: 04/03/2013    Years since quitting: 8.4   Smokeless tobacco: Never   Tobacco comments:    One cigar per day.  Smoked 40 years cigarettes.  10 years ago quit cigarettes.  Vaping Use   Vaping Use: Never used  Substance and Sexual Activity   Alcohol use: No    Alcohol/week: 0.0 standard drinks   Drug use: No    Types: Marijuana   Sexual activity: Yes  Other Topics Concern   Not on file  Social History Narrative   Lives with his fourth wife.    Social Determinants of Health   Financial Resource Strain: Low Risk    Difficulty of Paying Living Expenses: Not hard at all  Food Insecurity: No Food Insecurity   Worried About Programme researcher, broadcasting/film/video in the Last Year: Never true   Ran Out of Food in the Last Year: Never true  Transportation Needs: No Transportation Needs   Lack of Transportation (Medical): No   Lack of Transportation (Non-Medical): No  Physical Activity: Insufficiently Active   Days of Exercise per Week: 7 days   Minutes of Exercise per Session: 10 min  Stress: No Stress Concern Present   Feeling of Stress : Not at all  Social Connections: Socially Integrated   Frequency of Communication with Friends and Family: More than three times a week   Frequency of Social Gatherings with Friends and Family: More than three times a week   Attends Religious Services: 1 to 4 times per year   Active Member of [employer] or Organizations: Yes   Attends Banker Meetings: 1 to 4 times per year   Marital Status: Married  Catering manager Violence: Not At Risk   Fear of Current or Ex-Partner: No   Emotionally Abused: No   Physically Abused: No   Sexually Abused: No    Family History:    Family History  Problem Relation Age of Onset   Heart disease Mother    COPD Father    Aneurysm Father    Anesthesia problems Neg Hx     Hypotension Neg Hx    Malignant hyperthermia Neg Hx    Pseudochol deficiency Neg Hx      ROS:  Please see the history of present illness.   All other ROS reviewed and negative.     Physical Exam/Data:   Vitals:   09/21/21 1934 09/21/21 2322 09/22/21 0222 09/22/21 0231  BP:  120/71 (!) 113/56   Pulse: 95 95 88 91  Resp: 18  16 18   Temp:  98.4 F (36.9 C) 98 F (36.7 C)   TempSrc:  Oral Oral   SpO2: 94% 94% 96%   Weight:    69 kg  Height:        Intake/Output Summary (Last 24 hours) at 09/22/2021 0758 Last data filed at 09/21/2021 1814 Gross per 24 hour  Intake 720 ml  Output 1050 ml  Net -330 ml   Last 3 Weights 09/22/2021 09/21/2021 09/20/2021  Weight (lbs) 152 lb 1.9 oz 152 lb 5.4 oz 154 lb 1.6 oz  Weight (kg) 69 kg 69.1 kg 69.9 kg     Body mass index is 22.46 kg/m.  General:  in no acute distress HEENT: normal Neck: + JVD Vascular: No carotid bruits; Distal pulses 2+ bilaterally Cardiac: Irregular, normal rate, 2/6 systolic murmur Lungs: Expiratory wheezing Abd: soft, nontender Ext: 1+ edema LLE, trace edema RLE Musculoskeletal:  BUE and BLE strength normal and equal Skin: warm and dry  Neuro:  no focal abnormalities noted Psych:  Normal affect   EKG:  The EKG was personally reviewed and demonstrates:  atrial fibrillation, rate 81, poor R wave progression, nonspecific T wave flattening, QTC 530. Telemetry:  Telemetry was personally reviewed and demonstrates:  Afib with rate 70-90s  Relevant CV Studies: TTE 09/20/21:  1. Left ventricular ejection fraction, by estimation, is 60 to 65%. The  left ventricle has normal function. The left ventricle has no regional  wall motion abnormalities. There is mild left ventricular hypertrophy.  Left ventricular diastolic parameters  are consistent with Grade II diastolic dysfunction (pseudonormalization).  There is the interventricular septum is flattened in systole and diastole,  consistent with right ventricular  pressure and volume overload.   2. Right ventricular systolic function is moderately reduced. The right  ventricular size is normal. There is moderately elevated pulmonary artery  systolic pressure.   3. Left atrial size was severely dilated.   4. Right atrial size was moderately dilated.   5. The mitral valve is normal in structure. No evidence of mitral valve  regurgitation. No evidence of mitral stenosis.   6. The aortic valve is tricuspid. There is moderate calcification of the  aortic valve. There is moderate thickening of the aortic valve. Aortic  valve regurgitation is not visualized. Mild to moderate aortic valve  stenosis.   7. The inferior vena cava is dilated in size with <50% respiratory  variability, suggesting right atrial pressure of 15 mmHg.   Laboratory Data:  High Sensitivity Troponin:   Recent Labs  Lab 09/19/21 1718 09/19/21 1921  TROPONINIHS 29* 30*     Chemistry Recent Labs  Lab 09/19/21 1921 09/20/21 0523 09/21/21 0515 09/22/21 0352  NA  --  142 138 137  K  --  3.2* 2.9* 3.3*  CL  --  99 98 97*  CO2  --  36* 34* 34*  GLUCOSE  --  100* 125* 152*  BUN  --  13 17 24*  CREATININE  --  1.13 0.94 0.99  CALCIUM  --  8.2* 8.1* 8.2*  MG 2.7* 2.4  --  2.2  GFRNONAA  --  >60 >60 >60  ANIONGAP  --  7 6 6     Recent Labs  Lab 09/19/21 1718 09/20/21 0523  PROT 6.1* 6.5  ALBUMIN 3.0* 3.1*  AST 24 27  ALT 19 19  ALKPHOS 106 106  BILITOT 0.9 1.0   Lipids No results for input(s): CHOL, TRIG, HDL, LABVLDL, LDLCALC, CHOLHDL in the  last 168 hours.  Hematology Recent Labs  Lab 09/19/21 1718 09/20/21 0523 09/22/21 0352  WBC 7.0 7.8 10.5  RBC 4.97 5.33 4.70  HGB 14.3 14.8 13.5  HCT 45.4 49.1 42.6  MCV 91.3 92.1 90.6  MCH 28.8 27.8 28.7  MCHC 31.5 30.1 31.7  RDW 16.8* 17.0* 16.3*  PLT 128* 123* 130*   Thyroid  Recent Labs  Lab 09/20/21 0712  TSH 6.972*  FREET4 1.02    BNP Recent Labs  Lab 09/19/21 1718  BNP 439.0*    DDimer No results  for input(s): DDIMER in the last 168 hours.   Radiology/Studies:  DG Chest Port 1 View  Result Date: 09/19/2021 CLINICAL DATA:  Shortness of breath EXAM: PORTABLE CHEST 1 VIEW COMPARISON:  03/16/2013 FINDINGS: Hardware in the cervical spine. Cardiomegaly with vascular congestion and interstitial pulmonary edema. There is underlying emphysema. Small left and small moderate right pleural effusion. Basilar airspace disease. Aortic atherosclerosis. IMPRESSION: 1. Cardiomegaly with vascular congestion, interstitial edema and right greater than left pleural effusion 2. Underlying emphysema. Airspace disease at the bases may reflect atelectasis or pneumonia Electronically Signed   By: Jasmine Pang M.D.   On: 09/19/2021 17:46   ECHOCARDIOGRAM COMPLETE  Result Date: 09/20/2021    ECHOCARDIOGRAM REPORT   Patient Name:   Luis Salinas Date of Exam: 09/20/2021 Medical Rec #:  [id number]      Height:       69.0 in Accession #:    [id number]     Weight:       154.1 lb Date of Birth:  1946-11-26     BSA:          1.849 m Patient Age:    74 years       BP:           106/54 mmHg Patient Gender: M              HR:           93 bpm. [facility] Procedure: 2D Echo, Cardiac Doppler and Color Doppler Indications:    CHF-Acute Diastolic  History:        Patient has prior history of Echocardiogram examinations, most                 recent 02/21/2018. CHF, CAD, TIA and COPD, Arrythmias:Atrial                 Fibrillation; Risk Factors:Hypertension and Dyslipidemia.                 Stents, Tobacco abuse.  Sonographer:    Mikki Harbor Referring Phys: [id number] OLADAPO ADEFESO IMPRESSIONS  1. Left ventricular ejection fraction, by estimation, is 60 to 65%. The left ventricle has normal function. The left ventricle has no regional wall motion abnormalities. There is mild left ventricular hypertrophy. Left ventricular diastolic parameters are consistent with Grade II diastolic dysfunction (pseudonormalization). There  is the interventricular septum is flattened in systole and diastole, consistent with right ventricular pressure and volume overload.  2. Right ventricular systolic function is moderately reduced. The right ventricular size is normal. There is moderately elevated pulmonary artery systolic pressure.  3. Left atrial size was severely dilated.  4. Right atrial size was moderately dilated.  5. The mitral valve is normal in structure. No evidence of mitral valve regurgitation. No evidence of mitral stenosis.  6. The aortic valve is tricuspid. There is moderate calcification of the aortic valve. There is moderate thickening  of the aortic valve. Aortic valve regurgitation is not visualized. Mild to moderate aortic valve stenosis.  7. The inferior vena cava is dilated in size with <50% respiratory variability, suggesting right atrial pressure of 15 mmHg. FINDINGS  Left Ventricle: Left ventricular ejection fraction, by estimation, is 60 to 65%. The left ventricle has normal function. The left ventricle has no regional wall motion abnormalities. The left ventricular internal cavity size was normal in size. There is  mild left ventricular hypertrophy. The interventricular septum is flattened in systole and diastole, consistent with right ventricular pressure and volume overload. Left ventricular diastolic parameters are consistent with Grade II diastolic dysfunction  (pseudonormalization). Right Ventricle: The right ventricular size is normal. No increase in right ventricular wall thickness. Right ventricular systolic function is moderately reduced. There is moderately elevated pulmonary artery systolic pressure. The tricuspid regurgitant velocity is 3.16 m/s, and with an assumed right atrial pressure of 15 mmHg, the estimated right ventricular systolic pressure is 54.9 mmHg. Left Atrium: Left atrial size was severely dilated. Right Atrium: Right atrial size was moderately dilated. Pericardium: There is no evidence of  pericardial effusion. Mitral Valve: The mitral valve is normal in structure. No evidence of mitral valve regurgitation. No evidence of mitral valve stenosis. MV peak gradient, 5.1 mmHg. The mean mitral valve gradient is 2.0 mmHg. Tricuspid Valve: The tricuspid valve is normal in structure. Tricuspid valve regurgitation is mild . No evidence of tricuspid stenosis. Aortic Valve: The aortic valve is tricuspid. There is moderate calcification of the aortic valve. There is moderate thickening of the aortic valve. Aortic valve regurgitation is not visualized. Mild to moderate aortic stenosis is present. Aortic valve mean gradient measures 4.7 mmHg. Aortic valve peak gradient measures 8.6 mmHg. Aortic valve area, by VTI measures 2.37 cm. Pulmonic Valve: The pulmonic valve was normal in structure. Pulmonic valve regurgitation is not visualized. No evidence of pulmonic stenosis. Aorta: The aortic root is normal in size and structure. Venous: The inferior vena cava is dilated in size with less than 50% respiratory variability, suggesting right atrial pressure of 15 mmHg. IAS/Shunts: No atrial level shunt detected by color flow Doppler. Additional Comments: There is a small pleural effusion in the left lateral region.  LEFT VENTRICLE PLAX 2D LVIDd:         3.60 cm   Diastology LVIDs:         2.70 cm   LV e' medial:    7.94 cm/s LV PW:         1.30 cm   LV E/e' medial:  16.8 LV IVS:        1.20 cm   LV e' lateral:   6.53 cm/s LVOT diam:     2.00 cm   LV E/e' lateral: 20.4 LV SV:         60 LV SV Index:   32 LVOT Area:     3.14 cm  RIGHT VENTRICLE RV Basal diam:  3.55 cm RV Mid diam:    3.40 cm RV S prime:     16.50 cm/s TAPSE (M-mode): 2.2 cm LEFT ATRIUM              Index        RIGHT ATRIUM           Index LA diam:        5.10 cm  2.76 cm/m   RA Area:     18.00 cm LA Vol (A2C):   115.0 ml 62.19 ml/m  RA  Volume:   49.60 ml  26.82 ml/m LA Vol (A4C):   78.7 ml  42.56 ml/m LA Biplane Vol: 95.5 ml  51.64 ml/m  AORTIC VALVE                     PULMONIC VALVE AV Area (Vmax):    2.28 cm     PV Vmax:       0.64 m/s AV Area (Vmean):   2.35 cm     PV Peak grad:  1.7 mmHg AV Area (VTI):     2.37 cm AV Vmax:           146.33 cm/s AV Vmean:          97.300 cm/s AV VTI:            0.253 m AV Peak Grad:      8.6 mmHg AV Mean Grad:      4.7 mmHg LVOT Vmax:         106.00 cm/s LVOT Vmean:        72.800 cm/s LVOT VTI:          0.191 m LVOT/AV VTI ratio: 0.76  AORTA Ao Root diam: 3.60 cm Ao Asc diam:  3.50 cm MITRAL VALVE                TRICUSPID VALVE MV Area (PHT): 5.13 cm     TR Peak grad:   39.9 mmHg MV Area VTI:   2.67 cm     TR Vmax:        316.00 cm/s MV Peak grad:  5.1 mmHg MV Mean grad:  2.0 mmHg     SHUNTS MV Vmax:       1.13 m/s     Systemic VTI:  0.19 m MV Vmean:      58.9 cm/s    Systemic Diam: 2.00 cm MV Decel Time: 148 msec MV E velocity: 133.00 cm/s Donato Schultz MD Electronically signed by Donato Schultz MD Signature Date/Time: 09/20/2021/1:54:35 PM    Final      Assessment and Plan:   Acute on chronic diastolic heart failure: Presented with volume overload.  Echocardiogram on 10/8 showed EF 60 to 65%, grade 2 diastolic dysfunction, interventricular septal flattening in systole and diastole is consistent with RV pressure and volume overload, moderate RV dysfunction, severe left atrial dilatation, moderate right atrial dilatation, mild to moderate aortic stenosis, RAP 15 mmHg. -Diuresing well with IV Lasix.  Remains hypervolemic on exam.  Continue IV Lasix 40 mg twice daily -Strict I's and O's -Daily weights -Replete potassium for goal greater than 4, magnesium for greater than 2  Atrial fibrillation: new diagnosis.  CHA2DS2-VASc score 6 (hypertension, age x2, diabetes, CVA, CAD) -Continue Eliquis 5 mg twice daily -Rates appear controlled, continue Bystolic 5 mg daily -Given rates well controlled, no urgency for cardioversion.  Could consider elective cardioversion once completed 3 weeks of anticoagulation.  However given  severe left atrial dilatation on echo, may not maintain sinus rhythm.    CAD: Status post BMS to LCx in 11/2017.  Denies any chest pain. -Continue Eliquis, statin  Aortic stenosis: Mild to moderate on echo 09/20/2021.  Will monitor  COPD exacerbation: on IV Solu-Medrol, Pulmicort, duo nebs per primary team.  Hypertension: On Bystolic, appears controlled  Hypokalemia: Replete to goal K greater than 4.  Will increase potassium supplement to 40 meq BID while diuresing   For questions or updates, please contact [facility] Please consult [email or web address] for contact  info under    Signed, Little Ishikawa, MD  09/22/2021 7:58 AM

## 2021-09-23 LAB — BASIC METABOLIC PANEL
Anion gap: 6 (ref 5–15)
BUN: 25 mg/dL — ABNORMAL HIGH (ref 8–23)
CO2: 32 mmol/L (ref 22–32)
Calcium: 8.1 mg/dL — ABNORMAL LOW (ref 8.9–10.3)
Chloride: 99 mmol/L (ref 98–111)
Creatinine, Ser: 0.86 mg/dL (ref 0.61–1.24)
GFR, Estimated: >60 mL/min (ref >60)
Glucose, Bld: 191 mg/dL — ABNORMAL HIGH (ref 70–99)
Potassium: 3.8 mmol/L (ref 3.5–5.1)
Sodium: 137 mmol/L (ref 135–145)

## 2021-09-23 LAB — MAGNESIUM: Magnesium: 2.4 mg/dL (ref 1.7–2.4)

## 2021-09-23 NOTE — Progress Notes (Signed)
Progress Note  Patient Name: Luis Salinas Date of Encounter: 09/23/2021  [facility] Cardiologist: None Dr. Bjorn Pippin  Subjective   Feels much better.  No chest pain.  Decrease shortness of breath.  Wife here.  Inpatient Medications    Scheduled Meds:  apixaban  5 mg Oral BID   arformoterol  15 mcg Nebulization BID   budesonide (PULMICORT) nebulizer solution  0.5 mg Nebulization BID   Chlorhexidine Gluconate Cloth  6 each Topical Daily   dipyridamole-aspirin  1 capsule Oral BID   feeding supplement  237 mL Oral BID BM   furosemide  40 mg Intravenous Q12H   ipratropium-albuterol  3 mL Nebulization BID   methylPREDNISolone (SOLU-MEDROL) injection  40 mg Intravenous Q12H   multivitamin with minerals  1 tablet Oral Daily   nebivolol  5 mg Oral Daily   potassium chloride  40 mEq Oral BID   rosuvastatin  40 mg Oral Daily   Continuous Infusions:  PRN Meds: acetaminophen, nitroGLYCERIN, prochlorperazine   Vital Signs    Vitals:   09/23/21 0801 09/23/21 0805 09/23/21 0813 09/23/21 1000  BP:    (!) 143/73  Pulse:    89  Resp:    20  Temp:    97.6 F (36.4 C)  TempSrc:    Oral  SpO2: 95% 98% 98% 94%  Weight:      Height:        Intake/Output Summary (Last 24 hours) at 09/23/2021 1102 Last data filed at 09/23/2021 0900 Gross per 24 hour  Intake 240 ml  Output 2500 ml  Net -2260 ml   Last 3 Weights 09/23/2021 09/22/2021 09/21/2021  Weight (lbs) 148 lb 13 oz 152 lb 1.9 oz 152 lb 5.4 oz  Weight (kg) 67.5 kg 69 kg 69.1 kg      Telemetry    Atrial fibrillation heart rate in the 80s to 90s- Personally Reviewed  ECG    No new- Personally Reviewed  Physical Exam   GEN: No acute distress.  Thin Neck: No JVD, nasal cannula oxygen Cardiac: Irregularly irregular, no murmurs, rubs, or gallops.  Respiratory: Moderate wheezes bilaterally. GI: Soft, nontender, non-distended  MS: No edema; No deformity. Neuro:  Nonfocal  Psych: Normal affect   Labs    High  Sensitivity Troponin:   Recent Labs  Lab 09/19/21 1718 09/19/21 1921  TROPONINIHS 29* 30*     Chemistry Recent Labs  Lab 09/19/21 1718 09/19/21 1921 09/20/21 0523 09/21/21 0515 09/22/21 0352 09/23/21 0508  NA 141  --  142 138 137 137  K 2.7*  --  3.2* 2.9* 3.3* 3.8  CL 102  --  99 98 97* 99  CO2 33*  --  36* 34* 34* 32  GLUCOSE 112*  --  100* 125* 152* 191*  BUN 14  --  13 17 24* 25*  CREATININE 1.10  --  1.13 0.94 0.99 0.86  CALCIUM 8.1*  --  8.2* 8.1* 8.2* 8.1*  MG  --    < > 2.4  --  2.2 2.4  PROT 6.1*  --  6.5  --   --   --   ALBUMIN 3.0*  --  3.1*  --   --   --   AST 24  --  27  --   --   --   ALT 19  --  19  --   --   --   ALKPHOS 106  --  106  --   --   --  BILITOT 0.9  --  1.0  --   --   --   GFRNONAA >60  --  >60 >60 >60 >60  ANIONGAP 6  --  7 6 6 6    < > = values in this interval not displayed.    Lipids No results for input(s): CHOL, TRIG, HDL, LABVLDL, LDLCALC, CHOLHDL in the last 168 hours.  Hematology Recent Labs  Lab 09/19/21 1718 09/20/21 0523 09/22/21 0352  WBC 7.0 7.8 10.5  RBC 4.97 5.33 4.70  HGB 14.3 14.8 13.5  HCT 45.4 49.1 42.6  MCV 91.3 92.1 90.6  MCH 28.8 27.8 28.7  MCHC 31.5 30.1 31.7  RDW 16.8* 17.0* 16.3*  PLT 128* 123* 130*   Thyroid  Recent Labs  Lab 09/20/21 0712  TSH 6.972*  FREET4 1.02    BNP Recent Labs  Lab 09/19/21 1718  BNP 439.0*    DDimer No results for input(s): DDIMER in the last 168 hours.   Radiology    No results found.  Cardiac Studies   ECHO EF 65%   Patient Profile     75 y.o. male with acute on chronic diastolic heart failure, diabetes COPD CAD CVA smoker  Assessment & Plan    Acute on chronic diastolic heart failure - Admitted on 10/7.  Worsening shortness of breath over the past week or so.  Hemodynamically stable. - 2.5 L again out yesterday.  Excellent.  Creatinine 0.86 trending downward.  Potassium has been repleted.  Continue.  Likely has more to go.  Hypokalemia - Replete for  potassium greater than 4 magnesium greater than 2.  Excellent.  Paroxysmal atrial fibrillation - New diagnosis, CHA2DS2-VASc score is 6.  Continue with Eliquis 5 mg twice a day. - On Bystolic 5 mg a day rate controlled. - Agree that there is no urgency for cardioversion.  Could consider elective cardioversion after 3 weeks of continuous Eliquis.  There is a chance given his severe left atrial dilatation on echocardiogram that he will not maintain sinus rhythm well.  Coronary artery disease - Prior bare-metal stent to left circumflex in 2018.  Doing well.  Continue with statin therapy.  Aortic stenosis - Mild to moderate on echocardiogram.  Continue to monitor.  No surgical needs.  COPD exacerbation - On IV Solu-Medrol Pulmicort, DuoNebs per primary team.  Essential hypertension - Continue with Bystolic appears controlled.  Should be ready for discharge tomorrow.  He was not taking Lasix at home.  40 mg once a day would be great.  We talked about daily weights at home as well as 1-1/2 L fluid restriction.  Low-salt.    For questions or updates, please contact [facility] Please consult [email or web address] for contact info under        Signed, 2019, MD  09/23/2021, 11:02 AM

## 2021-09-23 NOTE — Progress Notes (Signed)
PROGRESS NOTE  Luis Salinas CZY:[id number] DOB: 1946-04-25 DOA: 09/19/2021 PCP: Dettinger, Elige Radon, MD Brief History:  75 year old male with a history of diabetes mellitus type 2, stroke, foot artery disease/MI, COPD, hypertension, tobacco abuse presenting with 5-day history of shortness of breath, worsening lower extremity edema, increased abdominal girth, and orthopnea.  The patient states that he has had to sleep in a recliner for the last 2 weeks because of orthopnea.  He denies any fevers, chills, headache, neck pain, chest pain, nausea, vomiting, diarrhea, abdominal pain, dysuria, hematuria, hematochezia, melena.  He has had a cough with yellow sputum. The patient was on 2 L nasal cannula at home which was increased to 3 L by his PCP on the day prior to admission.  He continued to have shortness of breath.  As result, EMS was activated.  The patient was found to have oxygen saturation in the mid 80s on 3 L.   In the emergency department, the patient was afebrile hemodynamically stable with oxygen saturation in the low 90s on 6 L.  Chest x-ray showed increased interstitial markings and right greater than left pleural effusion.  BMP showed sodium 141, potassium 2.7, serum creatinine 1.10.  LFTs unremarkable.  WBC 7.0, hemoglobin 14.3, platelets 128,000.  The patient was started on intravenous furosemide.   Assessment/Plan: Acute diastolic CHF -Patient remains fluid overloaded -Echo--60-65%, WMA; G2DD, PASP 54.9 -Continue intravenous furosemide 40 mg twice daily -Accurate I's and O's   Acute on chronic respiratory failure with hypoxia -Secondary to pulmonary edema and COPD exacerbation -Chronically on 2 L nasal cannula -Currently on 6 L nasal cannula>>4L -Wean oxygen back to baseline   Atrial fibrillation, new onset, type unspecified -Personally reviewed EKG--atrial fibrillation with nonspecific T wave changes -CHADSVASc = 8 -Continue apixaban -rate controlled, continue  bystolic   COPD exacerbation -Continue duo nebs -Continue Pulmicort -Continue  IV Solu-Medrol -added Brovana   Thrombocytopenia -Likely due to chronic hepatic congestion -Serum B12--315 -Folic acid--12.2 -TSH 6.72; Free T4--1.02   Coronary artery disease -11/19/2017 cath--100% L-Cx s/p BMS -no chest pain presently -personally reviewed EKG--sinus, nonspecific TWI   Essential hypertension -continue bystolic   Diabetes mellitus type 2 -05/29/2020 hemoglobin A1c 6.3 -05/23/2021 hemoglobin A1c 6.0 -09/20/2021 A1C--6.0 -Patient is not on any agents in outpatient setting   Hypokalemia -Repleted -Continue daily KCl -Check magnesium 2.4   Tobacco abuse -Tobacco cessation discussed   Marijuana dependence -Cessation discussed   Hyperlipidemia -Continue statin               Status is: Inpatient   Remains inpatient appropriate because:Inpatient level of care appropriate due to severity of illness   Dispo: The patient is from: Home              Anticipated d/c is to: Home              Patient currently is not medically stable to d/c.              Difficult to place patient No               Family Communication:   spouse updated 10/11   Consultants:  none   Code Status:  FULL    DVT Prophylaxis:  apixaban     Procedures: As Listed in Progress Note Above   Antibiotics: None   Total time spent 35 minutes.  Greater than 50% spent face to face counseling and coordinating care.  Subjective:  Patient denies fevers, chills, headache, chest pain, dyspnea, nausea, vomiting, diarrhea, abdominal pain, dysuria, hematuria, hematochezia, and melena.   Objective: Vitals:   09/23/21 0805 09/23/21 0813 09/23/21 1000 09/23/21 1441  BP:   (!) 143/73 (!) 142/76  Pulse:   89 90  Resp:   20 19  Temp:   97.6 F (36.4 C) 98.1 F (36.7 C)  TempSrc:   Oral Oral  SpO2: 98% 98% 94% 95%  Weight:      Height:        Intake/Output Summary (Last 24 hours) at 09/23/2021  1516 Last data filed at 09/23/2021 1400 Gross per 24 hour  Intake 480 ml  Output 5100 ml  Net -4620 ml   Weight change: -1.5 kg Exam:  General:  Pt is alert, follows commands appropriately, not in acute distress HEENT: No icterus, No thrush, No neck mass, [place]/AT Cardiovascular: RRR, S1/S2, no rubs, no gallops Respiratory: scattered bilateral rales.  No wheeze.  Diminished BS Abdomen: Soft/+BS, non tender, non distended, no guarding Extremities: trace LE edema, No lymphangitis, No petechiae, No rashes, no synovitis   Data Reviewed: I have personally reviewed following labs and imaging studies Basic Metabolic Panel: Recent Labs  Lab 09/19/21 1718 09/19/21 1921 09/20/21 0523 09/21/21 0515 09/22/21 0352 09/23/21 0508  NA 141  --  142 138 137 137  K 2.7*  --  3.2* 2.9* 3.3* 3.8  CL 102  --  99 98 97* 99  CO2 33*  --  36* 34* 34* 32  GLUCOSE 112*  --  100* 125* 152* 191*  BUN 14  --  13 17 24* 25*  CREATININE 1.10  --  1.13 0.94 0.99 0.86  CALCIUM 8.1*  --  8.2* 8.1* 8.2* 8.1*  MG  --  2.7* 2.4  --  2.2 2.4  PHOS  --   --  2.8  --   --   --    Liver Function Tests: Recent Labs  Lab 09/19/21 1718 09/20/21 0523  AST 24 27  ALT 19 19  ALKPHOS 106 106  BILITOT 0.9 1.0  PROT 6.1* 6.5  ALBUMIN 3.0* 3.1*   No results for input(s): LIPASE, AMYLASE in the last 168 hours. No results for input(s): AMMONIA in the last 168 hours. Coagulation Profile: Recent Labs  Lab 09/19/21 1718 09/20/21 0523  INR 1.1 1.2   CBC: Recent Labs  Lab 09/19/21 1718 09/20/21 0523 09/22/21 0352  WBC 7.0 7.8 10.5  NEUTROABS 5.0  --   --   HGB 14.3 14.8 13.5  HCT 45.4 49.1 42.6  MCV 91.3 92.1 90.6  PLT 128* 123* 130*   Cardiac Enzymes: No results for input(s): CKTOTAL, CKMB, CKMBINDEX, TROPONINI in the last 168 hours. BNP: Invalid input(s): POCBNP CBG: No results for input(s): GLUCAP in the last 168 hours. HbA1C: No results for input(s): HGBA1C in the last 72 hours. Urine  analysis:    Component Value Date/Time   COLORURINE STRAW (A) 09/19/2021 2115   APPEARANCEUR CLEAR 09/19/2021 2115   LABSPEC 1.004 (L) 09/19/2021 2115   PHURINE 8.0 09/19/2021 2115   GLUCOSEU NEGATIVE 09/19/2021 2115   HGBUR SMALL (A) 09/19/2021 2115   BILIRUBINUR NEGATIVE 09/19/2021 2115   KETONESUR NEGATIVE 09/19/2021 2115   PROTEINUR NEGATIVE 09/19/2021 2115   UROBILINOGEN 0.2 03/17/2013 0619   NITRITE NEGATIVE 09/19/2021 2115   LEUKOCYTESUR NEGATIVE 09/19/2021 2115   Sepsis Labs: @LABRCNTIP (procalcitonin:4,lacticidven:4) ) Recent Results (from the past 240 hour(s))  Resp Panel by RT-PCR (Flu A&B, Covid) Nasopharyngeal  Swab     Status: None   Collection Time: 09/19/21  6:07 PM   Specimen: Nasopharyngeal Swab; Nasopharyngeal(NP) swabs in vial transport medium  Result Value Ref Range Status   SARS Coronavirus 2 by RT PCR NEGATIVE NEGATIVE Final    Comment: (NOTE) SARS-CoV-2 target nucleic acids are NOT DETECTED.  The SARS-CoV-2 RNA is generally detectable in upper respiratory specimens during the acute phase of infection. The lowest concentration of SARS-CoV-2 viral copies this assay can detect is 138 copies/mL. A negative result does not preclude SARS-Cov-2 infection and should not be used as the sole basis for treatment or other patient management decisions. A negative result may occur with  improper specimen collection/handling, submission of specimen other than nasopharyngeal swab, presence of viral mutation(s) within the areas targeted by this assay, and inadequate number of viral copies(<138 copies/mL). A negative result must be combined with clinical observations, patient history, and epidemiological information. The expected result is Negative.  Fact Sheet for Patients:  BloggerCourse.com  Fact Sheet for Healthcare Providers:  SeriousBroker.it  This test is no t yet approved or cleared by the [place] FDA  and  has been authorized for detection and/or diagnosis of SARS-CoV-2 by FDA under an Emergency Use Authorization (EUA). This EUA will remain  in effect (meaning this test can be used) for the duration of the COVID-19 declaration under Section 564(b)(1) of the Act, 21 U.S.C.section 360bbb-3(b)(1), unless the authorization is terminated  or revoked sooner.       Influenza A by PCR NEGATIVE NEGATIVE Final   Influenza B by PCR NEGATIVE NEGATIVE Final    Comment: (NOTE) The Xpert Xpress SARS-CoV-2/FLU/RSV plus assay is intended as an aid in the diagnosis of influenza from Nasopharyngeal swab specimens and should not be used as a sole basis for treatment. Nasal washings and aspirates are unacceptable for Xpert Xpress SARS-CoV-2/FLU/RSV testing.  Fact Sheet for Patients: BloggerCourse.com  Fact Sheet for Healthcare Providers: SeriousBroker.it  This test is not yet approved or cleared by the [place] FDA and has been authorized for detection and/or diagnosis of SARS-CoV-2 by FDA under an Emergency Use Authorization (EUA). This EUA will remain in effect (meaning this test can be used) for the duration of the COVID-19 declaration under Section 564(b)(1) of the Act, 21 U.S.C. section 360bbb-3(b)(1), unless the authorization is terminated or revoked.  Performed at [facility], [address] [place], [place] [address]   MRSA Next Gen by PCR, Nasal     Status: None   Collection Time: 09/20/21  5:54 AM   Specimen: Nasal Mucosa; Nasal Swab  Result Value Ref Range Status   MRSA by PCR Next Gen NOT DETECTED NOT DETECTED Final    Comment: (NOTE) The GeneXpert MRSA Assay (FDA approved for NASAL specimens only), is one component of a comprehensive MRSA colonization surveillance program. It is not intended to diagnose MRSA infection nor to guide or monitor treatment for MRSA infections. Test performance is not FDA approved in patients  less than 36 years old. Performed at [facility], [address] [place], [place] [address]      Scheduled Meds:  apixaban  5 mg Oral BID   arformoterol  15 mcg Nebulization BID   budesonide (PULMICORT) nebulizer solution  0.5 mg Nebulization BID   Chlorhexidine Gluconate Cloth  6 each Topical Daily   dipyridamole-aspirin  1 capsule Oral BID   feeding supplement  237 mL Oral BID BM   furosemide  40 mg Intravenous Q12H   ipratropium-albuterol  3 mL Nebulization BID   methylPREDNISolone (SOLU-MEDROL) injection  40 mg Intravenous Q12H   multivitamin with minerals  1 tablet Oral Daily   nebivolol  5 mg Oral Daily   potassium chloride  40 mEq Oral BID   rosuvastatin  40 mg Oral Daily   Continuous Infusions:  Procedures/Studies: DG Chest Port 1 View  Result Date: 09/19/2021 CLINICAL DATA:  Shortness of breath EXAM: PORTABLE CHEST 1 VIEW COMPARISON:  03/16/2013 FINDINGS: Hardware in the cervical spine. Cardiomegaly with vascular congestion and interstitial pulmonary edema. There is underlying emphysema. Small left and small moderate right pleural effusion. Basilar airspace disease. Aortic atherosclerosis. IMPRESSION: 1. Cardiomegaly with vascular congestion, interstitial edema and right greater than left pleural effusion 2. Underlying emphysema. Airspace disease at the bases may reflect atelectasis or pneumonia Electronically Signed   By: Jasmine Pang M.D.   On: 09/19/2021 17:46   ECHOCARDIOGRAM COMPLETE  Result Date: 09/20/2021    ECHOCARDIOGRAM REPORT   Patient Name:   PHARES DIEMERT Date of Exam: 09/20/2021 Medical Rec #:  [id number]      Height:       69.0 in Accession #:    [id number]     Weight:       154.1 lb Date of Birth:  1946-07-24     BSA:          1.849 m Patient Age:    74 years       BP:           106/54 mmHg Patient Gender: M              HR:           93 bpm. [facility] Procedure: 2D Echo, Cardiac Doppler and Color Doppler Indications:    CHF-Acute Diastolic   History:        Patient has prior history of Echocardiogram examinations, most                 recent 02/21/2018. CHF, CAD, TIA and COPD, Arrythmias:Atrial                 Fibrillation; Risk Factors:Hypertension and Dyslipidemia.                 Stents, Tobacco abuse.  Sonographer:    Mikki Harbor Referring Phys: [id number] OLADAPO ADEFESO IMPRESSIONS  1. Left ventricular ejection fraction, by estimation, is 60 to 65%. The left ventricle has normal function. The left ventricle has no regional wall motion abnormalities. There is mild left ventricular hypertrophy. Left ventricular diastolic parameters are consistent with Grade II diastolic dysfunction (pseudonormalization). There is the interventricular septum is flattened in systole and diastole, consistent with right ventricular pressure and volume overload.  2. Right ventricular systolic function is moderately reduced. The right ventricular size is normal. There is moderately elevated pulmonary artery systolic pressure.  3. Left atrial size was severely dilated.  4. Right atrial size was moderately dilated.  5. The mitral valve is normal in structure. No evidence of mitral valve regurgitation. No evidence of mitral stenosis.  6. The aortic valve is tricuspid. There is moderate calcification of the aortic valve. There is moderate thickening of the aortic valve. Aortic valve regurgitation is not visualized. Mild to moderate aortic valve stenosis.  7. The inferior vena cava is dilated in size with <50% respiratory variability, suggesting right atrial pressure of 15 mmHg. FINDINGS  Left Ventricle: Left ventricular ejection fraction, by estimation, is 60 to 65%. The left  ventricle has normal function. The left ventricle has no regional wall motion abnormalities. The left ventricular internal cavity size was normal in size. There is  mild left ventricular hypertrophy. The interventricular septum is flattened in systole and diastole, consistent with right ventricular  pressure and volume overload. Left ventricular diastolic parameters are consistent with Grade II diastolic dysfunction  (pseudonormalization). Right Ventricle: The right ventricular size is normal. No increase in right ventricular wall thickness. Right ventricular systolic function is moderately reduced. There is moderately elevated pulmonary artery systolic pressure. The tricuspid regurgitant velocity is 3.16 m/s, and with an assumed right atrial pressure of 15 mmHg, the estimated right ventricular systolic pressure is 54.9 mmHg. Left Atrium: Left atrial size was severely dilated. Right Atrium: Right atrial size was moderately dilated. Pericardium: There is no evidence of pericardial effusion. Mitral Valve: The mitral valve is normal in structure. No evidence of mitral valve regurgitation. No evidence of mitral valve stenosis. MV peak gradient, 5.1 mmHg. The mean mitral valve gradient is 2.0 mmHg. Tricuspid Valve: The tricuspid valve is normal in structure. Tricuspid valve regurgitation is mild . No evidence of tricuspid stenosis. Aortic Valve: The aortic valve is tricuspid. There is moderate calcification of the aortic valve. There is moderate thickening of the aortic valve. Aortic valve regurgitation is not visualized. Mild to moderate aortic stenosis is present. Aortic valve mean gradient measures 4.7 mmHg. Aortic valve peak gradient measures 8.6 mmHg. Aortic valve area, by VTI measures 2.37 cm. Pulmonic Valve: The pulmonic valve was normal in structure. Pulmonic valve regurgitation is not visualized. No evidence of pulmonic stenosis. Aorta: The aortic root is normal in size and structure. Venous: The inferior vena cava is dilated in size with less than 50% respiratory variability, suggesting right atrial pressure of 15 mmHg. IAS/Shunts: No atrial level shunt detected by color flow Doppler. Additional Comments: There is a small pleural effusion in the left lateral region.  LEFT VENTRICLE PLAX 2D LVIDd:          3.60 cm   Diastology LVIDs:         2.70 cm   LV e' medial:    7.94 cm/s LV PW:         1.30 cm   LV E/e' medial:  16.8 LV IVS:        1.20 cm   LV e' lateral:   6.53 cm/s LVOT diam:     2.00 cm   LV E/e' lateral: 20.4 LV SV:         60 LV SV Index:   32 LVOT Area:     3.14 cm  RIGHT VENTRICLE RV Basal diam:  3.55 cm RV Mid diam:    3.40 cm RV S prime:     16.50 cm/s TAPSE (M-mode): 2.2 cm LEFT ATRIUM              Index        RIGHT ATRIUM           Index LA diam:        5.10 cm  2.76 cm/m   RA Area:     18.00 cm LA Vol (A2C):   115.0 ml 62.19 ml/m  RA Volume:   49.60 ml  26.82 ml/m LA Vol (A4C):   78.7 ml  42.56 ml/m LA Biplane Vol: 95.5 ml  51.64 ml/m  AORTIC VALVE                    PULMONIC VALVE AV Area (  Vmax):    2.28 cm     PV Vmax:       0.64 m/s AV Area (Vmean):   2.35 cm     PV Peak grad:  1.7 mmHg AV Area (VTI):     2.37 cm AV Vmax:           146.33 cm/s AV Vmean:          97.300 cm/s AV VTI:            0.253 m AV Peak Grad:      8.6 mmHg AV Mean Grad:      4.7 mmHg LVOT Vmax:         106.00 cm/s LVOT Vmean:        72.800 cm/s LVOT VTI:          0.191 m LVOT/AV VTI ratio: 0.76  AORTA Ao Root diam: 3.60 cm Ao Asc diam:  3.50 cm MITRAL VALVE                TRICUSPID VALVE MV Area (PHT): 5.13 cm     TR Peak grad:   39.9 mmHg MV Area VTI:   2.67 cm     TR Vmax:        316.00 cm/s MV Peak grad:  5.1 mmHg MV Mean grad:  2.0 mmHg     SHUNTS MV Vmax:       1.13 m/s     Systemic VTI:  0.19 m MV Vmean:      58.9 cm/s    Systemic Diam: 2.00 cm MV Decel Time: 148 msec MV E velocity: 133.00 cm/s Donato Schultz MD Electronically signed by Donato Schultz MD Signature Date/Time: 09/20/2021/1:54:35 PM    Final     Catarina Hartshorn, DO  [facility]  If 7PM-7AM, please contact night-coverage [email or web address] Password [email or web address] 09/23/2021, 3:16 PM   LOS: 4 days

## 2021-09-24 ENCOUNTER — Telehealth: Payer: Self-pay | Admitting: Family Medicine

## 2021-09-24 LAB — BASIC METABOLIC PANEL
Anion gap: 8 (ref 5–15)
BUN: 23 mg/dL (ref 8–23)
CO2: 29 mmol/L (ref 22–32)
Calcium: 8.2 mg/dL — ABNORMAL LOW (ref 8.9–10.3)
Chloride: 98 mmol/L (ref 98–111)
Creatinine, Ser: 0.85 mg/dL (ref 0.61–1.24)
GFR, Estimated: >60 mL/min (ref >60)
Glucose, Bld: 169 mg/dL — ABNORMAL HIGH (ref 70–99)
Potassium: 4.4 mmol/L (ref 3.5–5.1)
Sodium: 135 mmol/L (ref 135–145)

## 2021-09-24 MED ORDER — ASPIRIN EC 81 MG PO TBEC: 81.0000 mg | DELAYED_RELEASE_TABLET | Freq: Every day | ORAL | 0 refills | Status: DC

## 2021-09-24 MED ORDER — PREDNISONE 10 MG PO TABS: ORAL_TABLET | ORAL | 0 refills | Status: AC

## 2021-09-24 MED ORDER — POTASSIUM CHLORIDE CRYS ER 20 MEQ PO TBCR
20.0000 meq | EXTENDED_RELEASE_TABLET | Freq: Every day | ORAL | Status: DC
  Administered 2021-09-24: 20 meq via ORAL
  Filled 2021-09-24: qty 1

## 2021-09-24 MED ORDER — FUROSEMIDE 40 MG PO TABS: 40.0000 mg | ORAL_TABLET | Freq: Every day | ORAL | 0 refills | Status: DC

## 2021-09-24 MED ORDER — BLOOD GLUCOSE MONITOR KIT: PACK | 0 refills | Status: AC

## 2021-09-24 MED ORDER — APIXABAN 5 MG PO TABS: 5.0000 mg | ORAL_TABLET | Freq: Two times a day (BID) | ORAL | 0 refills | Status: DC

## 2021-09-24 MED ORDER — EMPAGLIFLOZIN 10 MG PO TABS
10.0000 mg | ORAL_TABLET | Freq: Every day | ORAL | 0 refills | Status: DC
Start: 2021-09-24 — End: 2021-10-01

## 2021-09-24 MED ORDER — EMPAGLIFLOZIN 10 MG PO TABS
10.0000 mg | ORAL_TABLET | Freq: Every day | ORAL | Status: DC
  Filled 2021-09-24 (×3): qty 1

## 2021-09-24 MED ORDER — POTASSIUM CHLORIDE CRYS ER 20 MEQ PO TBCR: 20.0000 meq | EXTENDED_RELEASE_TABLET | Freq: Every day | ORAL | 0 refills | Status: DC

## 2021-09-24 MED ORDER — FUROSEMIDE 40 MG PO TABS
40.0000 mg | ORAL_TABLET | Freq: Every day | ORAL | Status: DC
  Administered 2021-09-24: 40 mg via ORAL
  Filled 2021-09-24: qty 1

## 2021-09-24 NOTE — Discharge Summary (Signed)
Discharge Summary  DAKWON WENBERG JHE:[id number] DOB: 31-Aug-1946  PCP: Dettinger, Fransisca Kaufmann, MD  Admit date: 09/19/2021 Discharge date: 09/24/2021  Time spent: 40 mins  Recommendations for Outpatient Follow-up:  PCP in 1 week with repeat labs Cardiology in 4-6 weeks  Discharge Diagnoses:  Active Hospital Problems   Diagnosis Date Noted   Acute exacerbation of CHF (congestive heart failure) (HCC) 18/56/3149   Acute diastolic CHF (congestive heart failure) ([place]) 09/20/2021   COPD with acute exacerbation ([facility]) 09/20/2021   AF (paroxysmal atrial fibrillation) ([facility]) 09/19/2021   Prolonged QT interval 09/19/2021   Hypokalemia 09/19/2021   Elevated brain natriuretic peptide (BNP) level 09/19/2021   Thrombocytopenia (HCC) 09/19/2021   Elevated troponin 09/19/2021   HTN (hypertension) 05/01/2013   Tobacco abuse 04/02/2013   CAD in native artery 03/16/2013   Hyperlipidemia 03/16/2013   COPD (chronic obstructive pulmonary disease) ([facility]) 03/16/2013    Resolved Hospital Problems  No resolved problems to display.    Discharge Condition: Stable  Diet recommendation: Heart healthy  Vitals:   09/24/21 0828 09/24/21 0830  BP:    Pulse:    Resp:    Temp:    SpO2: 95% 92%    History of present illness:  75 year old male with a history of diabetes mellitus type 2, CVA, MI, COPD, hypertension, tobacco abuse presenting with 5-day history of shortness of breath, worsening lower extremity edema, increased abdominal girth, and orthopnea.  The patient states that he has had to sleep in a recliner for the last 2 weeks because of orthopnea. He has had a cough with yellow sputum. The patient was on 2 L nasal cannula at home which was increased to 3 L by his PCP on the day prior to admission.  He continued to have shortness of breath.  As result, EMS was activated.  The patient was found to have oxygen saturation in the mid 80s on 3 L. In the ED, patient was afebrile HD stable with oxygen  saturation in the low 90s on 6 L.  Chest x-ray showed increased interstitial markings and right greater than left pleural effusion. The patient was started on intravenous furosemide, admitted for further management.   Today, pt very eager to be discharged. Pt was able to ambulate the hallway with O2 sats staying above 88% upon ambulation and in the mid 90s at rest on 4L of O2. Pt denies any worsening SOB, chest pain, abdominal pain, N/V/D/C, fever/chills. Advised medication and follow up appointment compliance. Advised to quit smoking. Discussed entire discharge plans with wife at bedside     Hospital Course:  Principal Problem:   Acute exacerbation of CHF (congestive heart failure) ([facility]) Active Problems:   Hyperlipidemia   CAD in native artery   COPD (chronic obstructive pulmonary disease) (HCC)   Tobacco abuse   HTN (hypertension)   AF (paroxysmal atrial fibrillation) (HCC)   Prolonged QT interval   Hypokalemia   Elevated brain natriuretic peptide (BNP) level   Thrombocytopenia (HCC)   Elevated troponin   Acute diastolic CHF (congestive heart failure) (HCC)   COPD with acute exacerbation (HCC)    Acute on chronic diastolic HF Pulm HTN Pt appears euvolemic Echo--60-65%, WMA; G2DD, PASP 54.9 -14 L since admission Cardiology consulted, switch to PO lasix 40 mg daily Cardiology and PCP follow up   Acute on chronic respiratory failure with hypoxia COPD exacerbation Secondary to pulmonary edema and COPD exacerbation O2 dependent, now requiring about 4L upon discharge, continue to wean pending O2 sats Continue inhalers,  tapered dose of steroids O2 supplementation    Atrial fibrillation, new onset, type unspecified HR controlled CHADSVASc = 8 Continue apixaban, bystolic   Thrombocytopenia Likely due to chronic hepatic congestion, improving Serum U54--270, Folic WCBJ--62.8 TSH 3.15; Free T4--1.02   Coronary artery disease Chest pain free 01/20/2017 cath--100% L-Cx s/p  BMS Recently started on Eliquis, discontinue aggrenox and start only aspirin due to risk of bleeding (discussed with Dr Audie Box Cardiology on 09/24/21) Follow up with cardiology   Essential hypertension Continue bystolic   Prediabetes 17/05/1606 A1C--6.0 Started on Jardiance  Glucometer Kit provided Follow up with PCP   Tobacco abuse Tobacco cessation discussed   Marijuana dependence Cessation discussed   Hyperlipidemia Continue statin       Malnutrition Type:  Nutrition Problem: Inadequate oral intake Etiology: decreased appetite   Malnutrition Characteristics:  Signs/Symptoms: per patient/family report   Nutrition Interventions:  Interventions: Ensure Enlive (each supplement provides 350kcal and 20 grams of protein), MVI   Estimated body mass index is 21.88 kg/m as calculated from the following:   Height as of this encounter: 5' 9" (1.753 m).   Weight as of this encounter: 67.2 kg.    Procedures: None  Consultations: Cardiology  Discharge Exam: BP 126/82 (BP Location: Right Arm)   Pulse 64   Temp 97.7 F (36.5 C) (Oral)   Resp 18   Ht 5' 9" (1.753 m)   Wt 67.2 kg   SpO2 92%   BMI 21.88 kg/m   General: NAD  Cardiovascular: S1, S2 present Respiratory: Diminished BS b/l Abdomen: Soft, nontender, nondistended, bowel sounds present Musculoskeletal: No bilateral pedal edema noted Skin: Normal Psychiatry: Normal mood   Discharge Instructions You were cared for by a hospitalist during your hospital stay. If you have any questions about your discharge medications or the care you received while you were in the hospital after you are discharged, you can call the unit and asked to speak with the hospitalist on call if the hospitalist that took care of you is not available. Once you are discharged, your primary care physician will handle any further medical issues. Please note that NO REFILLS for any discharge medications will be authorized once you are  discharged, as it is imperative that you return to your primary care physician (or establish a relationship with a primary care physician if you do not have one) for your aftercare needs so that they can reassess your need for medications and monitor your lab values.  Discharge Instructions     Diet - low sodium heart healthy   Complete by: As directed    Increase activity slowly   Complete by: As directed       Allergies as of 09/24/2021   No Known Allergies      Medication List     STOP taking these medications    dipyridamole-aspirin 200-25 MG 12hr capsule Commonly known as: AGGRENOX   hydrocortisone 1 % lotion   ibuprofen 600 MG tablet Commonly known as: ADVIL   loratadine 10 MG tablet Commonly known as: CLARITIN       TAKE these medications    Albuterol Sulfate 108 (90 Base) MCG/ACT Aepb Commonly known as: PROAIR RESPICLICK Inhale 2 puffs into the lungs every 6 (six) hours as needed (shortness of breath/ wheeze).   apixaban 5 MG Tabs tablet Commonly known as: ELIQUIS Take 1 tablet (5 mg total) by mouth 2 (two) times daily.   aspirin EC 81 MG tablet Take 1 tablet (81 mg total) by  mouth daily. Swallow whole.   blood glucose meter kit and supplies Kit Dispense based on patient and insurance preference. Use up to four times daily as directed.   Breztri Aerosphere 160-9-4.8 MCG/ACT Aero Generic drug: Budeson-Glycopyrrol-Formoterol Inhale 2 puffs into the lungs 2 (two) times daily.   diphenhydramine-acetaminophen 25-500 MG Tabs tablet Commonly known as: TYLENOL PM Take 1 tablet by mouth at bedtime as needed.   empagliflozin 10 MG Tabs tablet Commonly known as: JARDIANCE Take 1 tablet (10 mg total) by mouth daily.   furosemide 40 MG tablet Commonly known as: LASIX Take 1 tablet (40 mg total) by mouth daily. Start taking on: September 25, 2021   ICAPS AREDS FORMULA PO Take 2 capsules by mouth 2 (two) times daily.   nebivolol 5 MG tablet Commonly  known as: Bystolic Take 1 tablet (5 mg total) by mouth daily.   nitroGLYCERIN 0.4 MG SL tablet Commonly known as: NITROSTAT Place 1 tablet (0.4 mg total) under the tongue every 5 (five) minutes as needed for chest pain.   potassium chloride SA 20 MEQ tablet Commonly known as: KLOR-CON Take 1 tablet (20 mEq total) by mouth daily. Start taking on: September 25, 2021   predniSONE 10 MG tablet Commonly known as: DELTASONE Take 4 tablets (40 mg total) by mouth daily with breakfast for 2 days, THEN 3 tablets (30 mg total) daily with breakfast for 2 days, THEN 2 tablets (20 mg total) daily with breakfast for 2 days, THEN 1 tablet (10 mg total) daily with breakfast for 2 days. Start taking on: September 24, 2021   rosuvastatin 40 MG tablet Commonly known as: CRESTOR Take 1 tablet (40 mg total) by mouth daily. What changed: when to take this       No Known Allergies  Follow-up Information     Dettinger, Fransisca Kaufmann, MD. Schedule an appointment as soon as possible for a visit in 1 week(s).   Specialties: Family Medicine, Cardiology Contact information: [place] [address] [phone]         Donato Heinz, MD Follow up.   Specialties: Cardiology, Radiology Why: Cardiology office will call you for an appointment in 4-6 weeks Contact information: [address] [place] [address] [phone]                  The results of significant diagnostics from this hospitalization (including imaging, microbiology, ancillary and laboratory) are listed below for reference.    Significant Diagnostic Studies: DG Chest Port 1 View  Result Date: 09/19/2021 CLINICAL DATA:  Shortness of breath EXAM: PORTABLE CHEST 1 VIEW COMPARISON:  03/16/2013 FINDINGS: Hardware in the cervical spine. Cardiomegaly with vascular congestion and interstitial pulmonary edema. There is underlying emphysema. Small left and small moderate right pleural effusion. Basilar  airspace disease. Aortic atherosclerosis. IMPRESSION: 1. Cardiomegaly with vascular congestion, interstitial edema and right greater than left pleural effusion 2. Underlying emphysema. Airspace disease at the bases may reflect atelectasis or pneumonia Electronically Signed   By: Donavan Foil M.D.   On: 09/19/2021 17:46   ECHOCARDIOGRAM COMPLETE  Result Date: 09/20/2021    ECHOCARDIOGRAM REPORT   Patient Name:   JAIDYN KUHL Date of Exam: 09/20/2021 Medical Rec #:  [id number]      Height:       69.0 in Accession #:    [id number]     Weight:       154.1 lb Date of Birth:  1946-07-29  BSA:          1.849 m Patient Age:    75 years       BP:           106/54 mmHg Patient Gender: M              HR:           93 bpm. Exam Location:  [facility] Procedure: 2D Echo, Cardiac Doppler and Color Doppler Indications:    CHF-Acute Diastolic  History:        Patient has prior history of Echocardiogram examinations, most                 recent 02/21/2018. CHF, CAD, TIA and COPD, Arrythmias:Atrial                 Fibrillation; Risk Factors:Hypertension and Dyslipidemia.                 Stents, Tobacco abuse.  Sonographer:    Wenda Low Referring Phys: [id number] OLADAPO ADEFESO IMPRESSIONS  1. Left ventricular ejection fraction, by estimation, is 60 to 65%. The left ventricle has normal function. The left ventricle has no regional wall motion abnormalities. There is mild left ventricular hypertrophy. Left ventricular diastolic parameters are consistent with Grade II diastolic dysfunction (pseudonormalization). There is the interventricular septum is flattened in systole and diastole, consistent with right ventricular pressure and volume overload.  2. Right ventricular systolic function is moderately reduced. The right ventricular size is normal. There is moderately elevated pulmonary artery systolic pressure.  3. Left atrial size was severely dilated.  4. Right atrial size was moderately dilated.  5. The mitral valve is  normal in structure. No evidence of mitral valve regurgitation. No evidence of mitral stenosis.  6. The aortic valve is tricuspid. There is moderate calcification of the aortic valve. There is moderate thickening of the aortic valve. Aortic valve regurgitation is not visualized. Mild to moderate aortic valve stenosis.  7. The inferior vena cava is dilated in size with <50% respiratory variability, suggesting right atrial pressure of 15 mmHg. FINDINGS  Left Ventricle: Left ventricular ejection fraction, by estimation, is 60 to 65%. The left ventricle has normal function. The left ventricle has no regional wall motion abnormalities. The left ventricular internal cavity size was normal in size. There is  mild left ventricular hypertrophy. The interventricular septum is flattened in systole and diastole, consistent with right ventricular pressure and volume overload. Left ventricular diastolic parameters are consistent with Grade II diastolic dysfunction  (pseudonormalization). Right Ventricle: The right ventricular size is normal. No increase in right ventricular wall thickness. Right ventricular systolic function is moderately reduced. There is moderately elevated pulmonary artery systolic pressure. The tricuspid regurgitant velocity is 3.16 m/s, and with an assumed right atrial pressure of 15 mmHg, the estimated right ventricular systolic pressure is 60.6 mmHg. Left Atrium: Left atrial size was severely dilated. Right Atrium: Right atrial size was moderately dilated. Pericardium: There is no evidence of pericardial effusion. Mitral Valve: The mitral valve is normal in structure. No evidence of mitral valve regurgitation. No evidence of mitral valve stenosis. MV peak gradient, 5.1 mmHg. The mean mitral valve gradient is 2.0 mmHg. Tricuspid Valve: The tricuspid valve is normal in structure. Tricuspid valve regurgitation is mild . No evidence of tricuspid stenosis. Aortic Valve: The aortic valve is tricuspid. There is  moderate calcification of the aortic valve. There is moderate thickening of the aortic valve. Aortic valve regurgitation is not visualized.  Mild to moderate aortic stenosis is present. Aortic valve mean gradient measures 4.7 mmHg. Aortic valve peak gradient measures 8.6 mmHg. Aortic valve area, by VTI measures 2.37 cm. Pulmonic Valve: The pulmonic valve was normal in structure. Pulmonic valve regurgitation is not visualized. No evidence of pulmonic stenosis. Aorta: The aortic root is normal in size and structure. Venous: The inferior vena cava is dilated in size with less than 50% respiratory variability, suggesting right atrial pressure of 15 mmHg. IAS/Shunts: No atrial level shunt detected by color flow Doppler. Additional Comments: There is a small pleural effusion in the left lateral region.  LEFT VENTRICLE PLAX 2D LVIDd:         3.60 cm   Diastology LVIDs:         2.70 cm   LV e' medial:    7.94 cm/s LV PW:         1.30 cm   LV E/e' medial:  16.8 LV IVS:        1.20 cm   LV e' lateral:   6.53 cm/s LVOT diam:     2.00 cm   LV E/e' lateral: 20.4 LV SV:         60 LV SV Index:   32 LVOT Area:     3.14 cm  RIGHT VENTRICLE RV Basal diam:  3.55 cm RV Mid diam:    3.40 cm RV S prime:     16.50 cm/s TAPSE (M-mode): 2.2 cm LEFT ATRIUM              Index        RIGHT ATRIUM           Index LA diam:        5.10 cm  2.76 cm/m   RA Area:     18.00 cm LA Vol (A2C):   115.0 ml 62.19 ml/m  RA Volume:   49.60 ml  26.82 ml/m LA Vol (A4C):   78.7 ml  42.56 ml/m LA Biplane Vol: 95.5 ml  51.64 ml/m  AORTIC VALVE                    PULMONIC VALVE AV Area (Vmax):    2.28 cm     PV Vmax:       0.64 m/s AV Area (Vmean):   2.35 cm     PV Peak grad:  1.7 mmHg AV Area (VTI):     2.37 cm AV Vmax:           146.33 cm/s AV Vmean:          97.300 cm/s AV VTI:            0.253 m AV Peak Grad:      8.6 mmHg AV Mean Grad:      4.7 mmHg LVOT Vmax:         106.00 cm/s LVOT Vmean:        72.800 cm/s LVOT VTI:          0.191 m LVOT/AV VTI  ratio: 0.76  AORTA Ao Root diam: 3.60 cm Ao Asc diam:  3.50 cm MITRAL VALVE                TRICUSPID VALVE MV Area (PHT): 5.13 cm     TR Peak grad:   39.9 mmHg MV Area VTI:   2.67 cm     TR Vmax:        316.00 cm/s MV Peak grad:  5.1 mmHg  MV Mean grad:  2.0 mmHg     SHUNTS MV Vmax:       1.13 m/s     Systemic VTI:  0.19 m MV Vmean:      58.9 cm/s    Systemic Diam: 2.00 cm MV Decel Time: 148 msec MV E velocity: 133.00 cm/s Candee Furbish MD Electronically signed by Candee Furbish MD Signature Date/Time: 09/20/2021/1:54:35 PM    Final     Microbiology: Recent Results (from the past 240 hour(s))  Resp Panel by RT-PCR (Flu A&B, Covid) Nasopharyngeal Swab     Status: None   Collection Time: 09/19/21  6:07 PM   Specimen: Nasopharyngeal Swab; Nasopharyngeal(NP) swabs in vial transport medium  Result Value Ref Range Status   SARS Coronavirus 2 by RT PCR NEGATIVE NEGATIVE Final    Comment: (NOTE) SARS-CoV-2 target nucleic acids are NOT DETECTED.  The SARS-CoV-2 RNA is generally detectable in upper respiratory specimens during the acute phase of infection. The lowest concentration of SARS-CoV-2 viral copies this assay can detect is 138 copies/mL. A negative result does not preclude SARS-Cov-2 infection and should not be used as the sole basis for treatment or other patient management decisions. A negative result may occur with  improper specimen collection/handling, submission of specimen other than nasopharyngeal swab, presence of viral mutation(s) within the areas targeted by this assay, and inadequate number of viral copies(<138 copies/mL). A negative result must be combined with clinical observations, patient history, and epidemiological information. The expected result is Negative.  Fact Sheet for Patients:  EntrepreneurPulse.com.au  Fact Sheet for Healthcare Providers:  IncredibleEmployment.be  This test is no t yet approved or cleared by the [place] FDA  and  has been authorized for detection and/or diagnosis of SARS-CoV-2 by FDA under an Emergency Use Authorization (EUA). This EUA will remain  in effect (meaning this test can be used) for the duration of the COVID-19 declaration under Section 564(b)(1) of the Act, 21 U.S.C.section 360bbb-3(b)(1), unless the authorization is terminated  or revoked sooner.       Influenza A by PCR NEGATIVE NEGATIVE Final   Influenza B by PCR NEGATIVE NEGATIVE Final    Comment: (NOTE) The Xpert Xpress SARS-CoV-2/FLU/RSV plus assay is intended as an aid in the diagnosis of influenza from Nasopharyngeal swab specimens and should not be used as a sole basis for treatment. Nasal washings and aspirates are unacceptable for Xpert Xpress SARS-CoV-2/FLU/RSV testing.  Fact Sheet for Patients: EntrepreneurPulse.com.au  Fact Sheet for Healthcare Providers: IncredibleEmployment.be  This test is not yet approved or cleared by the [place] FDA and has been authorized for detection and/or diagnosis of SARS-CoV-2 by FDA under an Emergency Use Authorization (EUA). This EUA will remain in effect (meaning this test can be used) for the duration of the COVID-19 declaration under Section 564(b)(1) of the Act, 21 U.S.C. section 360bbb-3(b)(1), unless the authorization is terminated or revoked.  Performed at [facility], [address], [place], [place] [address]   MRSA Next Gen by PCR, Nasal     Status: None   Collection Time: 09/20/21  5:54 AM   Specimen: Nasal Mucosa; Nasal Swab  Result Value Ref Range Status   MRSA by PCR Next Gen NOT DETECTED NOT DETECTED Final    Comment: (NOTE) The GeneXpert MRSA Assay (FDA approved for NASAL specimens only), is one component of a comprehensive MRSA colonization surveillance program. It is not intended to diagnose MRSA infection nor to guide or monitor treatment for MRSA infections. Test performance is  not FDA approved in patients  less than 78 years old. Performed at [facility], [address], [place], [place] [address]      Labs: Basic Metabolic Panel: Recent Labs  Lab 09/19/21 1921 09/20/21 5003 09/21/21 0515 09/22/21 0352 09/23/21 0508 09/24/21 0514  NA  --  142 138 137 137 135  K  --  3.2* 2.9* 3.3* 3.8 4.4  CL  --  99 98 97* 99 98  CO2  --  36* 34* 34* 32 29  GLUCOSE  --  100* 125* 152* 191* 169*  BUN  --  13 17 24* 25* 23  CREATININE  --  1.13 0.94 0.99 0.86 0.85  CALCIUM  --  8.2* 8.1* 8.2* 8.1* 8.2*  MG 2.7* 2.4  --  2.2 2.4  --   PHOS  --  2.8  --   --   --   --    Liver Function Tests: Recent Labs  Lab 09/19/21 1718 09/20/21 0523  AST 24 27  ALT 19 19  ALKPHOS 106 106  BILITOT 0.9 1.0  PROT 6.1* 6.5  ALBUMIN 3.0* 3.1*   No results for input(s): LIPASE, AMYLASE in the last 168 hours. No results for input(s): AMMONIA in the last 168 hours. CBC: Recent Labs  Lab 09/19/21 1718 09/20/21 0523 09/22/21 0352  WBC 7.0 7.8 10.5  NEUTROABS 5.0  --   --   HGB 14.3 14.8 13.5  HCT 45.4 49.1 42.6  MCV 91.3 92.1 90.6  PLT 128* 123* 130*   Cardiac Enzymes: No results for input(s): CKTOTAL, CKMB, CKMBINDEX, TROPONINI in the last 168 hours. BNP: BNP (last 3 results) Recent Labs    09/19/21 1718  BNP 439.0*    ProBNP (last 3 results) No results for input(s): PROBNP in the last 8760 hours.  CBG: No results for input(s): GLUCAP in the last 168 hours.     Signed:  Alma Friendly, MD [facility] 09/24/2021, 12:54 PM

## 2021-09-24 NOTE — Progress Notes (Signed)
Nutrition Education Note  RD consulted for nutrition education regarding CHF. Patient wife requested low sodium diet edu.   RD working remotely.  Contacted pt by telephone and was directed to his wife Luis Salinas.   RD reviewed- "Low Sodium diet. Obtained patient's dietary recall. Provided examples on ways to decrease sodium intake in diet. Discouraged intake of processed foods and use of salt shaker. Patient doesn't like Luis Salinas but is willing to try NuSalt.  Encouraged fresh fruits and vegetables as well as whole grain sources of carbohydrates to maximize fiber intake.   RD discussed why it is important for patient to adhere to diet recommendations, and emphasized the role of fluids, foods to avoid, and importance of weighing self daily. Teach back method used.  Expect good compliance.  Body mass index is 21.88 kg/m. Pt meets criteria for normal based on current BMI.  Current diet order is Heart Healthy, patient is consuming approximately >50% of meals at this time.   Labs and medications reviewed.  BMP Latest Ref Rng & Units 09/24/2021 09/23/2021 09/22/2021  Glucose 70 - 99 mg/dL 737(T) 062(I) 948(N)  BUN 8 - 23 mg/dL 23 46(E) 70(J)  Creatinine 0.61 - 1.24 mg/dL 5.00 9.38 1.82  BUN/Creat Ratio 10 - 24 - - -  Sodium 135 - 145 mmol/L 135 137 137  Potassium 3.5 - 5.1 mmol/L 4.4 3.8 3.3(L)  Chloride 98 - 111 mmol/L 98 99 97(L)  CO2 22 - 32 mmol/L 29 32 34(H)  Calcium 8.9 - 10.3 mg/dL 8.2(L) 8.1(L) 8.2(L)     No further nutrition interventions warranted at this time. RD contact information provided. Patient being discharged home today.   Luis Shivers Luis,RD,CSG,LDN Contact: Luis Salinas

## 2021-09-24 NOTE — Progress Notes (Signed)
Cardiology Progress Note  [name] MRN: [id number] DOB: 01/03/1946 Date of Encounter: 09/24/2021  Primary Cardiologist: None  Subjective   Chief Complaint: None.  HPI: Good diuresis.  Plans for discharge today.  Net -14.2 L since admission.  ROS:  All other ROS reviewed and negative. Pertinent positives noted in the HPI.     Inpatient Medications  Scheduled Meds:  apixaban  5 mg Oral BID   arformoterol  15 mcg Nebulization BID   budesonide (PULMICORT) nebulizer solution  0.5 mg Nebulization BID   Chlorhexidine Gluconate Cloth  6 each Topical Daily   dipyridamole-aspirin  1 capsule Oral BID   empagliflozin  10 mg Oral Daily   feeding supplement  237 mL Oral BID BM   furosemide  40 mg Oral Daily   ipratropium-albuterol  3 mL Nebulization BID   methylPREDNISolone (SOLU-MEDROL) injection  40 mg Intravenous Q12H   multivitamin with minerals  1 tablet Oral Daily   nebivolol  5 mg Oral Daily   potassium chloride  20 mEq Oral Daily   rosuvastatin  40 mg Oral Daily   Continuous Infusions:  PRN Meds: acetaminophen, nitroGLYCERIN, prochlorperazine   Vital Signs   Vitals:   09/24/21 0439 09/24/21 0825 09/24/21 0828 09/24/21 0830  BP:      Pulse:      Resp:      Temp:      TempSrc:      SpO2:  91% 95% 92%  Weight: 67.2 kg     Height:        Intake/Output Summary (Last 24 hours) at 09/24/2021 0929 Last data filed at 09/24/2021 0500 Gross per 24 hour  Intake 720 ml  Output 5150 ml  Net -4430 ml   Last 3 Weights 09/24/2021 09/23/2021 09/22/2021  Weight (lbs) 148 lb 2.4 oz 148 lb 13 oz 152 lb 1.9 oz  Weight (kg) 67.2 kg 67.5 kg 69 kg      Telemetry  Overnight telemetry shows A. fib 90-100 bpm, which I personally reviewed.   ECG  The most recent ECG shows atrial fibrillation heart rate 81, no acute ischemic changes, which I personally reviewed.   Physical Exam   Vitals:   09/24/21 0439 09/24/21 0825 09/24/21 0828 09/24/21 0830  BP:       Pulse:      Resp:      Temp:      TempSrc:      SpO2:  91% 95% 92%  Weight: 67.2 kg     Height:        Intake/Output Summary (Last 24 hours) at 09/24/2021 0929 Last data filed at 09/24/2021 0500 Gross per 24 hour  Intake 720 ml  Output 5150 ml  Net -4430 ml    Last 3 Weights 09/24/2021 09/23/2021 09/22/2021  Weight (lbs) 148 lb 2.4 oz 148 lb 13 oz 152 lb 1.9 oz  Weight (kg) 67.2 kg 67.5 kg 69 kg    Body mass index is 21.88 kg/m.   General: Well nourished, well developed, in no acute distress Head: Atraumatic, normal size  Eyes: PEERLA, EOMI  Neck: Supple, no JVD Endocrine: No thryomegaly Cardiac: Normal S1, S2; irregular rhythm, no murmurs rubs or gallops Lungs: Clear to auscultation bilaterally, no wheezing, rhonchi or rales  Abd: Soft, nontender, no hepatomegaly  Ext: No edema, pulses 2+ Musculoskeletal: No deformities, BUE and BLE strength normal and equal Skin: Warm and dry, no rashes   Neuro: Alert and oriented to person, place, time, and  situation, CNII-XII grossly intact, no focal deficits  Psych: Normal mood and affect   Labs  High Sensitivity Troponin:   Recent Labs  Lab 09/19/21 1718 09/19/21 1921  TROPONINIHS 29* 30*     Cardiac EnzymesNo results for input(s): TROPONINI in the last 168 hours. No results for input(s): TROPIPOC in the last 168 hours.  Chemistry Recent Labs  Lab 09/19/21 1718 09/20/21 0523 09/21/21 0515 09/22/21 0352 09/23/21 0508 09/24/21 0514  NA 141 142   < > 137 137 135  K 2.7* 3.2*   < > 3.3* 3.8 4.4  CL 102 99   < > 97* 99 98  CO2 33* 36*   < > 34* 32 29  GLUCOSE 112* 100*   < > 152* 191* 169*  BUN 14 13   < > 24* 25* 23  CREATININE 1.10 1.13   < > 0.99 0.86 0.85  CALCIUM 8.1* 8.2*   < > 8.2* 8.1* 8.2*  PROT 6.1* 6.5  --   --   --   --   ALBUMIN 3.0* 3.1*  --   --   --   --   AST 24 27  --   --   --   --   ALT 19 19  --   --   --   --   ALKPHOS 106 106  --   --   --   --   BILITOT 0.9 1.0  --   --   --   --   GFRNONAA  >60 >60   < > >60 >60 >60  ANIONGAP 6 7   < > 6 6 8    < > = values in this interval not displayed.    Hematology Recent Labs  Lab 09/19/21 1718 09/20/21 0523 09/22/21 0352  WBC 7.0 7.8 10.5  RBC 4.97 5.33 4.70  HGB 14.3 14.8 13.5  HCT 45.4 49.1 42.6  MCV 91.3 92.1 90.6  MCH 28.8 27.8 28.7  MCHC 31.5 30.1 31.7  RDW 16.8* 17.0* 16.3*  PLT 128* 123* 130*   BNP Recent Labs  Lab 09/19/21 1718  BNP 439.0*    DDimer No results for input(s): DDIMER in the last 168 hours.   Radiology  No results found.  Cardiac Studies  TTE 09/20/2021  1. Left ventricular ejection fraction, by estimation, is 60 to 65%. The  left ventricle has normal function. The left ventricle has no regional  wall motion abnormalities. There is mild left ventricular hypertrophy.  Left ventricular diastolic parameters  are consistent with Grade II diastolic dysfunction (pseudonormalization).  There is the interventricular septum is flattened in systole and diastole,  consistent with right ventricular pressure and volume overload.   2. Right ventricular systolic function is moderately reduced. The right  ventricular size is normal. There is moderately elevated pulmonary artery  systolic pressure.   3. Left atrial size was severely dilated.   4. Right atrial size was moderately dilated.   5. The mitral valve is normal in structure. No evidence of mitral valve  regurgitation. No evidence of mitral stenosis.   6. The aortic valve is tricuspid. There is moderate calcification of the  aortic valve. There is moderate thickening of the aortic valve. Aortic  valve regurgitation is not visualized. Mild to moderate aortic valve  stenosis.   7. The inferior vena cava is dilated in size with <50% respiratory  variability, suggesting right atrial pressure of 15 mmHg.   Patient Profile  Luis Salinas is  a 75 y.o. male with diabetes, stroke, CAD (BMS to LCX in 2018), COPD, hypertension, tobacco abuse who was admitted  on 09/19/2021 for acute epoxy respiratory failure secondary to acute diastolic heart failure.  Assessment & Plan   #Acute on chronic diastolic heart failure #Pulmonary hypertension -Euvolemic on exam.  Transition to 40 mg of Lasix daily.  He should be discharged on this.  I have also ordered 20 mEq of potassium.  He should go home with this. -We will add Jardiance 10 mg daily.  This is quite beneficial and HFpEF. -Blood pressure under good control.  Need to optimize his COPD.  #Atrial fibrillation -New diagnosis for him.  Rate controlled on Bystolic. -Continue Eliquis 5 mg twice daily. -Can consider outpatient cardioversion.  #CAD status post bare-metal stent in 2018 -No chest pain.  Continue Eliquis and statin.  [facility] will sign off.   Medication Recommendations: Transition to oral diuretics as above.  Jardiance 10 mg daily added.  Continue Eliquis 5 mg twice daily and Bystolic. Other recommendations (labs, testing, etc): None. Follow up as an outpatient: We will arrange outpatient follow-up in 4 to 6 weeks with Dr. Denzil Magnuson.  For questions or updates, please contact [facility] Please consult [email or web address] for contact info under   Time Spent with Patient: I have spent a total of 35 minutes with patient reviewing hospital notes, telemetry, EKGs, labs and examining the patient as well as establishing an assessment and plan that was discussed with the patient.  > 50% of time was spent in direct patient care.    Signed, Lenna Gilford. Flora Lipps, MD, [facility] [place]  [facility]  09/24/2021 9:29 AM

## 2021-09-24 NOTE — Telephone Encounter (Signed)
Please call patient to schedule hospital follow up with Dr Dettinger. He was discharged from hospital today.  Was in hospital for COPD, Congestive Heart Failure, and Afib

## 2021-09-24 NOTE — Telephone Encounter (Signed)
Appt made

## 2021-09-25 NOTE — Telephone Encounter (Signed)
Transition Care Management Follow-up Telephone Call Date of discharge and from where: [facility] 09/24/2021 Diagnosis: CHF Exacerbation How have you been since you were released from the hospital? stable Any questions or concerns? No  Items Reviewed: Did the pt receive and understand the discharge instructions provided? Yes  Medications obtained and verified? Yes  Other? No  Any new allergies since your discharge? No  Dietary orders reviewed? Yes Do you have support at home? Yes   Home Care and Equipment/Supplies: Were home health services ordered? He already has Home Health Were any new equipment or medical supplies ordered?  No He is already on Oxygen - right now on 4lpm  Functional Questionnaire: (I = Independent and D = Dependent) ADLs: I  Bathing/Dressing- I  Meal Prep- D  Eating- I  Maintaining continence- I  Transferring/Ambulation- I  Managing Meds- D  Follow up appointments reviewed:  PCP Hospital f/u appt confirmed? Yes  Scheduled to see Dettinger on 10/01/2021 @ 9:55. Specialist Hospital f/u appt confirmed? No  Will be making appt with cardiology Are transportation arrangements needed? No  If their condition worsens, is the pt aware to call PCP or go to the Emergency Dept.? Yes Was the patient provided with contact information for the PCP's office or ED? Yes Was to pt encouraged to call back with questions or concerns? Yes

## 2021-10-01 ENCOUNTER — Other Ambulatory Visit: Payer: Self-pay

## 2021-10-01 ENCOUNTER — Encounter: Payer: Self-pay | Admitting: Family Medicine

## 2021-10-01 ENCOUNTER — Ambulatory Visit (INDEPENDENT_AMBULATORY_CARE_PROVIDER_SITE_OTHER): Payer: Medicare Other | Admitting: Family Medicine

## 2021-10-01 VITALS — BP 109/71 | HR 83 | Ht 69.0 in | Wt 137.0 lb

## 2021-10-01 DIAGNOSIS — R7303 Prediabetes: Secondary | ICD-10-CM | POA: Diagnosis not present

## 2021-10-01 DIAGNOSIS — I5043 Acute on chronic combined systolic (congestive) and diastolic (congestive) heart failure: Secondary | ICD-10-CM | POA: Diagnosis not present

## 2021-10-01 DIAGNOSIS — I480 Paroxysmal atrial fibrillation: Secondary | ICD-10-CM | POA: Diagnosis not present

## 2021-10-01 MED ORDER — APIXABAN 5 MG PO TABS: 5.0000 mg | ORAL_TABLET | Freq: Two times a day (BID) | ORAL | 5 refills | Status: DC

## 2021-10-01 MED ORDER — EMPAGLIFLOZIN 10 MG PO TABS: 10.0000 mg | ORAL_TABLET | Freq: Every day | ORAL | 5 refills | Status: DC

## 2021-10-01 MED ORDER — POTASSIUM CHLORIDE CRYS ER 20 MEQ PO TBCR: 20.0000 meq | EXTENDED_RELEASE_TABLET | Freq: Every day | ORAL | 3 refills | Status: DC

## 2021-10-01 MED ORDER — FUROSEMIDE 40 MG PO TABS: 40.0000 mg | ORAL_TABLET | Freq: Every day | ORAL | 3 refills | Status: DC

## 2021-10-01 NOTE — Progress Notes (Signed)
BP 109/71   Pulse 83   Ht _0  (1.753 m)   Wt 137 lb (62.1 kg)   SpO2 94%   BMI 20.23 kg/m    Subjective:   [name], male    DOB: [birth year], 75 y.o.   MRN: [id number]  HPI: Luis Salinas is a 75 y.o. male presenting on 10/01/2021 for Congestive Heart Failure (TCM)   HPI Transition Care Management Follow-up Telephone Call Date of discharge and from where: [facility] 09/24/2021 Diagnosis: CHF Exacerbation How have you been since you were released from the hospital? stable Any questions or concerns? No Patient contacted on 09/25/2021 by [facility] LPN  Transition of care office visit Patient is here today for hospital follow-up and transition of care visit.  Patient was in the hospital on 09/19/2021 and discharged on 09/24/2021 for CHF exacerbation.  He was also diagnosed newly with A. fib and borderline diabetes and started on anticoagulation and Jardiance and furosemide and potassium.  He is supposed to have a cardiology follow-up within the next couple weeks.  He says his breathing and the swelling that he was having this much better.  He says he still feels a little bit of an irregular heart rate at times.  He denies any shortness of breath more than his baseline.  He is on 4 L nasal cannula as well.  He was started on Eliquis and furosemide and potassium and Jardiance  Relevant past medical, surgical, family and social history reviewed and updated as indicated. Interim medical history since our last visit reviewed. Allergies and medications reviewed and updated.  Review of Systems  Constitutional:  Negative for chills and fever.  Eyes:  Negative for visual disturbance.  Respiratory:  Negative for cough, shortness of breath and wheezing.   Cardiovascular:  Positive for palpitations and leg swelling. Negative for chest pain.  Musculoskeletal:  Negative for back pain and gait problem.  Skin:  Negative for rash.  Neurological:  Negative for dizziness,  weakness and light-headedness.  All other systems reviewed and are negative.  Per HPI unless specifically indicated above   Allergies as of 10/01/2021   No Known Allergies      Medication List        Accurate as of October 01, 2021 10:59 AM. If you have any questions, ask your nurse or doctor.          Albuterol Sulfate 108 (90 Base) MCG/ACT Aepb Commonly known as: PROAIR RESPICLICK Inhale 2 puffs into the lungs every 6 (six) hours as needed (shortness of breath/ wheeze).   apixaban 5 MG Tabs tablet Commonly known as: ELIQUIS Take 1 tablet (5 mg total) by mouth 2 (two) times daily.   aspirin EC 81 MG tablet Take 1 tablet (81 mg total) by mouth daily. Swallow whole.   blood glucose meter kit and supplies Kit Dispense based on patient and insurance preference. Use up to four times daily as directed.   Breztri Aerosphere 160-9-4.8 MCG/ACT Aero Generic drug: Budeson-Glycopyrrol-Formoterol Inhale 2 puffs into the lungs 2 (two) times daily.   diphenhydramine-acetaminophen 25-500 MG Tabs tablet Commonly known as: TYLENOL PM Take 1 tablet by mouth at bedtime as needed.   empagliflozin 10 MG Tabs tablet Commonly known as: JARDIANCE Take 1 tablet (10 mg total) by mouth daily.   furosemide 40 MG tablet Commonly known as: LASIX Take 1 tablet (40 mg total) by mouth daily.   ICAPS AREDS FORMULA PO Take 2 capsules by mouth 2 (  two) times daily.   nebivolol 5 MG tablet Commonly known as: Bystolic Take 1 tablet (5 mg total) by mouth daily.   nitroGLYCERIN 0.4 MG SL tablet Commonly known as: NITROSTAT Place 1 tablet (0.4 mg total) under the tongue every 5 (five) minutes as needed for chest pain.   potassium chloride SA 20 MEQ tablet Commonly known as: KLOR-CON Take 1 tablet (20 mEq total) by mouth daily.   predniSONE 10 MG tablet Commonly known as: DELTASONE Take 4 tablets (40 mg total) by mouth daily with breakfast for 2 days, THEN 3 tablets (30 mg total) daily with  breakfast for 2 days, THEN 2 tablets (20 mg total) daily with breakfast for 2 days, THEN 1 tablet (10 mg total) daily with breakfast for 2 days. Start taking on: September 24, 2021   rosuvastatin 40 MG tablet Commonly known as: CRESTOR Take 1 tablet (40 mg total) by mouth daily. What changed: when to take this         Objective:   BP 109/71   Pulse 83   Ht _0  (1.753 m)   Wt 137 lb (62.1 kg)   SpO2 94%   BMI 20.23 kg/m   Wt Readings from Last 3 Encounters:  10/01/21 137 lb (62.1 kg)  09/24/21 148 lb 2.4 oz (67.2 kg)  09/04/21 150 lb (68 kg)    Physical Exam Vitals and nursing note reviewed.  Constitutional:      General: He is not in acute distress.    Appearance: He is well-developed. He is not diaphoretic.  Eyes:     General: No scleral icterus.    Conjunctiva/sclera: Conjunctivae normal.  Neck:     Thyroid: No thyromegaly.  Cardiovascular:     Rate and Rhythm: Normal rate. Rhythm irregular.     Heart sounds: Normal heart sounds. No murmur heard. Pulmonary:     Effort: Pulmonary effort is normal. No respiratory distress.     Breath sounds: Normal breath sounds. No wheezing.  Abdominal:     General: Abdomen is flat. Bowel sounds are normal. There is no distension.     Tenderness: There is no abdominal tenderness.  Musculoskeletal:        General: Swelling (Trace edema in bilateral lower extremities) present. Normal range of motion.     Cervical back: Neck supple.  Lymphadenopathy:     Cervical: No cervical adenopathy.  Skin:    General: Skin is warm and dry.     Findings: No rash.  Neurological:     Mental Status: He is alert and oriented to person, place, and time.     Coordination: Coordination normal.  Psychiatric:        Behavior: Behavior normal.      Assessment & Plan:   Problem List Items Addressed This Visit       Cardiovascular and Mediastinum   Acute exacerbation of CHF (congestive heart failure) (HCC) - Primary   Relevant Medications    empagliflozin (JARDIANCE) 10 MG TABS tablet   furosemide (LASIX) 40 MG tablet   potassium chloride SA (KLOR-CON) 20 MEQ tablet   apixaban (ELIQUIS) 5 MG TABS tablet   Other Relevant Orders   CBC with Differential/Platelet   CMP14+EGFR   AF (paroxysmal atrial fibrillation) (HCC)   Relevant Medications   furosemide (LASIX) 40 MG tablet   apixaban (ELIQUIS) 5 MG TABS tablet     Other   Prediabetes   Relevant Medications   empagliflozin (JARDIANCE) 10 MG TABS tablet  Continue medicine that they started at the hospital, he has follow-up in 2 months with me, keep that.  He should see cardiology in between.  Breathing and swelling much improved and he is weighing self every day. Follow up plan: Return if symptoms worsen or fail to improve.  Counseling provided for all of the vaccine components Orders Placed This Encounter  Procedures   CBC with Differential/Platelet   CMP14+EGFR    Caryl Pina, MD [facility] 10/01/2021, 10:59 AM

## 2021-10-02 LAB — CBC WITH DIFFERENTIAL/PLATELET
Basophils Absolute: 0 10*3/uL (ref 0.0–0.2)
Basos: 0 %
EOS (ABSOLUTE): 0.1 10*3/uL (ref 0.0–0.4)
Eos: 0 %
Hematocrit: 45.9 % (ref 37.5–51.0)
Hemoglobin: 14.8 g/dL (ref 13.0–17.7)
Immature Grans (Abs): 0.1 10*3/uL (ref 0.0–0.1)
Immature Granulocytes: 1 %
Lymphocytes Absolute: 1.8 10*3/uL (ref 0.7–3.1)
Lymphs: 12 %
MCH: 28.4 pg (ref 26.6–33.0)
MCHC: 32.2 g/dL (ref 31.5–35.7)
MCV: 88 fL (ref 79–97)
Monocytes Absolute: 0.5 10*3/uL (ref 0.1–0.9)
Monocytes: 4 %
Neutrophils Absolute: 11.9 10*3/uL — ABNORMAL HIGH (ref 1.4–7.0)
Neutrophils: 83 %
Platelets: 205 10*3/uL (ref 150–450)
RBC: 5.22 x10E6/uL (ref 4.14–5.80)
RDW: 14.4 % (ref 11.6–15.4)
WBC: 14.4 10*3/uL — ABNORMAL HIGH (ref 3.4–10.8)

## 2021-10-02 LAB — CMP14+EGFR
ALT: 67 IU/L — ABNORMAL HIGH (ref 0–44)
AST: 34 IU/L (ref 0–40)
Albumin/Globulin Ratio: 1.4 (ref 1.2–2.2)
Albumin: 4 g/dL (ref 3.7–4.7)
Alkaline Phosphatase: 179 IU/L — ABNORMAL HIGH (ref 44–121)
BUN/Creatinine Ratio: 24 (ref 10–24)
BUN: 21 mg/dL (ref 8–27)
Bilirubin Total: 0.6 mg/dL (ref 0.0–1.2)
CO2: 29 mmol/L (ref 20–29)
Calcium: 9 mg/dL (ref 8.6–10.2)
Chloride: 98 mmol/L (ref 96–106)
Creatinine, Ser: 0.89 mg/dL (ref 0.76–1.27)
Globulin, Total: 2.9 g/dL (ref 1.5–4.5)
Glucose: 109 mg/dL — ABNORMAL HIGH (ref 70–99)
Potassium: 4.7 mmol/L (ref 3.5–5.2)
Sodium: 139 mmol/L (ref 134–144)
Total Protein: 6.9 g/dL (ref 6.0–8.5)
eGFR: 89 mL/min/{1.73_m2} (ref >59)

## 2021-10-05 DIAGNOSIS — J449 Chronic obstructive pulmonary disease, unspecified: Secondary | ICD-10-CM | POA: Diagnosis not present

## 2021-10-22 ENCOUNTER — Other Ambulatory Visit: Payer: Self-pay | Admitting: Family Medicine

## 2021-10-25 NOTE — H&P (View-Only) (Signed)
Cardiology Office Note:    Date:  11/03/2021   ID:  Luis Salinas, DOB [birth year], MRN [id number]  PCP:  Dettinger, Fransisca Kaufmann, MD  Cardiologist:  Donato Heinz, MD  Electrophysiologist:  None   Referring MD: Dettinger, Fransisca Kaufmann, MD   Chief Complaint: hospital follow-up for CHF and atrial fibrillation  History of Present Illness:    Luis Salinas is a 75 y.o. male with a history of CAD with STEMI in 11/2007 s/p BMS to LCX at [facility], chronic diastolic CHF, paroxysmal atrial fibrillation on Eliquis, CVA, hypertension, dyslipidemia, type 2 diabetes mellitus, COPD with pulmonary hypertension, and tobacco use who is followed by Dr. Gardiner Rhyme and presents today for hospital follow-up of CHF and atrial fibrillation.  Patient has a history of CAD and presented to [facility] with a STEMI in 11/2007. Patient underwent successful PCI with BMS to LCX at that time. He has had inconsistent follow-up with Cardiology in the past. He was recently admitted from 09/19/2021 to 09/24/2021 with acute hypoxic respiratory failure secondary to acute diastolic CHF and COPD exacerbation after presenting with worsening shortness of breath, orthopnea, lower extremity edema, and abdominal distension. He was also noted to be in new onset atrial fibrillation on presentation. BNP was elevated at 439. High-sensitivity troponin was minimally elevated and flat at 29 >> 30 consistent with demand ischemia. Echocardiogram showed LVEF of 60-65% with normal wall motion, grade 2 diastolic dysfunction, and flattened interventricular septum in systole and diastole consistent with RV pressure and volume overload. RV was normal in size with moderately reduced systolic function and moderately elevated PASP. Also showed biatrial enlargement and mild to moderate AS. Patient was diuresed with IV Lasix and then transitioned to PO. He was also started on Jardiance and Eliquis. Plans was for outpatient DCCV after 3 weeks of uninterrupted  anticoagulation.   Patient presents today for follow-up. Here with wife. He is doing well since leaving the hospital. He feels like his breathing his good.  He does have chronic dyspnea related to his COPD and is on 4 L of O2 at home "more often than not" since leaving the hospital.  However, this is stable.  He states he has been sleeping in a recliner for the past 2 months but denies any PND. No edema since leaving the hospital. His wife does report snoring and describes some apneic episodes.  He denies any chest pain.  He is still in atrial fibrillation today with rates in the 120s but he is asymptomatic with this.  No palpitations, lightheadedness, dizziness, syncope.  He has been compliant with his Eliquis and has not missed any doses month.  He is also on Aspirin 81 mg daily.  Has any abnormal bleeding with this.  Past Medical History:  Diagnosis Date   Amnesia 1967   Was in a coma and had no memory of the past. His family found him after 27 years   Arthritis    Cataract    Chronic diastolic CHF (congestive heart failure) (HCC)    COPD (chronic obstructive pulmonary disease) ([place])    Dyslipidemia    History of concussion    History of STEMI    a. 11/2007 s/p DES to LCX at [facility]   Hypertension    Paroxysmal atrial fibrillation (HCC)    Pneumonia    exposure to caustic chemicals   Stroke [facility])    multiple strokes   Tobacco abuse    Type 2 diabetes mellitus ([place])     Past Surgical  History:  Procedure Laterality Date   ANTERIOR CERVICAL DECOMP/DISCECTOMY FUSION  05/16/2012   Procedure: ANTERIOR CERVICAL DECOMPRESSION/DISCECTOMY FUSION 2 LEVELS;  Surgeon: Kristeen Miss, MD;  Location: [facility];  Service: Neurosurgery;  Laterality: N/A;  Cervical Five-Six, Cervical Six-Seven Anterior Cervical Decompression/Diskectomy Fusion   CARDIAC CATHETERIZATION     COLONOSCOPY W/ POLYPECTOMY     CORONARY ANGIOPLASTY     EYE SURGERY     cataracts   JOINT REPLACEMENT     Left knee replacement    JOINT REPLACEMENT     left elbow at age 57   ROTATOR CUFF REPAIR     left   TONSILLECTOMY      Current Medications: Current Meds  Medication Sig   Albuterol Sulfate 108 (90 Base) MCG/ACT AEPB Inhale 2 puffs into the lungs every 6 (six) hours as needed (shortness of breath/ wheeze).   apixaban (ELIQUIS) 5 MG TABS tablet Take 1 tablet (5 mg total) by mouth 2 (two) times daily.   aspirin 81 MG EC tablet TAKE 1 TABLET (81 MG TOTAL) BY MOUTH DAILY. SWALLOW WHOLE.   blood glucose meter kit and supplies KIT Dispense based on patient and insurance preference. Use up to four times daily as directed.   Budeson-Glycopyrrol-Formoterol (BREZTRI AEROSPHERE) 160-9-4.8 MCG/ACT AERO Inhale 2 puffs into the lungs 2 (two) times daily.   diphenhydramine-acetaminophen (TYLENOL PM) 25-500 MG TABS tablet Take 1 tablet by mouth at bedtime as needed.   empagliflozin (JARDIANCE) 10 MG TABS tablet Take 1 tablet (10 mg total) by mouth daily.   furosemide (LASIX) 40 MG tablet Take 1 tablet (40 mg total) by mouth daily.   Multiple Vitamins-Minerals (ICAPS AREDS FORMULA PO) Take 1 capsule by mouth 2 (two) times daily.   nitroGLYCERIN (NITROSTAT) 0.4 MG SL tablet Place 1 tablet (0.4 mg total) under the tongue every 5 (five) minutes as needed for chest pain.   potassium chloride SA (KLOR-CON) 20 MEQ tablet Take 1 tablet (20 mEq total) by mouth daily.   rosuvastatin (CRESTOR) 40 MG tablet Take 1 tablet (40 mg total) by mouth daily. (Patient taking differently: Take 40 mg by mouth at bedtime.)   [DISCONTINUED] nebivolol (BYSTOLIC) 5 MG tablet Take 1 tablet (5 mg total) by mouth daily.     Allergies:   Patient has no known allergies.   Social History   Socioeconomic History   Marital status: Married    Spouse name: Shirlean Mylar   Number of children: 7   Years of education: college   Highest education level: Some college, no degree  Occupational History   Occupation: Retired    Comment: Museum/gallery curator  Tobacco Use   Smoking  status: Light Smoker    Types: Cigars    Last attempt to quit: 04/03/2013    Years since quitting: 8.5   Smokeless tobacco: Never   Tobacco comments:    One cigar per day.  Smoked 40 years cigarettes.  10 years ago quit cigarettes.  Vaping Use   Vaping Use: Never used  Substance and Sexual Activity   Alcohol use: No    Alcohol/week: 0.0 standard drinks   Drug use: No    Types: Marijuana   Sexual activity: Yes  Other Topics Concern   Not on file  Social History Narrative   Lives with his fourth wife.    Social Determinants of Health   Financial Resource Strain: Low Risk    Difficulty of Paying Living Expenses: Not hard at all  Food Insecurity: No Food Insecurity  Worried About Charity fundraiser in the Last Year: Never true   [place] in the Last Year: Never true  Transportation Needs: No Transportation Needs   Lack of Transportation (Medical): No   Lack of Transportation (Non-Medical): No  Physical Activity: Insufficiently Active   Days of Exercise per Week: 7 days   Minutes of Exercise per Session: 10 min  Stress: No Stress Concern Present   Feeling of Stress : Not at all  Social Connections: Socially Integrated   Frequency of Communication with Friends and Family: More than three times a week   Frequency of Social Gatherings with Friends and Family: More than three times a week   Attends Religious Services: 1 to 4 times per year   Active Member of [employer] or Organizations: Yes   Attends Archivist Meetings: 1 to 4 times per year   Marital Status: Married     Family History: The patient's family history includes Aneurysm in his father; COPD in his father; Heart disease in his mother. There is no history of Anesthesia problems, Hypotension, Malignant hyperthermia, or Pseudochol deficiency.  ROS:   Please see the history of present illness.     EKGs/Labs/Other Studies Reviewed:    The following studies were reviewed today:  Echocardiogram  09/20/2021: Impressions:  1. Left ventricular ejection fraction, by estimation, is 60 to 65%. The  left ventricle has normal function. The left ventricle has no regional  wall motion abnormalities. There is mild left ventricular hypertrophy.  Left ventricular diastolic parameters  are consistent with Grade II diastolic dysfunction (pseudonormalization).  There is the interventricular septum is flattened in systole and diastole,  consistent with right ventricular pressure and volume overload.   2. Right ventricular systolic function is moderately reduced. The right  ventricular size is normal. There is moderately elevated pulmonary artery  systolic pressure.   3. Left atrial size was severely dilated.   4. Right atrial size was moderately dilated.   5. The mitral valve is normal in structure. No evidence of mitral valve  regurgitation. No evidence of mitral stenosis.   6. The aortic valve is tricuspid. There is moderate calcification of the  aortic valve. There is moderate thickening of the aortic valve. Aortic  valve regurgitation is not visualized. Mild to moderate aortic valve  stenosis.   7. The inferior vena cava is dilated in size with <50% respiratory  variability, suggesting right atrial pressure of 15 mmHg.  EKG:  EKG ordered today. EKG personally reviewed and demonstrates atrial fibrillation, rate 124 bpm, with non-specific ST/T changes.   Recent Labs: 09/19/2021: B Natriuretic Peptide 439.0 09/20/2021: TSH 6.972 09/23/2021: Magnesium 2.4 10/01/2021: ALT 67; BUN 21; Creatinine, Ser 0.89; Hemoglobin 14.8; Platelets 205; Potassium 4.7; Sodium 139  Recent Lipid Panel    Component Value Date/Time   CHOL 104 05/23/2021 0816   CHOL 114 05/01/2013 1302   TRIG 98 05/23/2021 0816   TRIG 88 03/29/2015 1106   TRIG 141 05/01/2013 1302   HDL 39 (L) 05/23/2021 0816   HDL 38 (L) 03/29/2015 1106   HDL 34 (L) 05/01/2013 1302   CHOLHDL 2.7 05/23/2021 0816   CHOLHDL 4.0 03/17/2013 0415    VLDL 12 03/17/2013 0415   LDLCALC 46 05/23/2021 0816   LDLCALC 52 05/01/2013 1302    Physical Exam:    Vital Signs: BP 124/62   Pulse (!) 124   Ht 5' 9"  (1.753 m)   Wt 139 lb 12.8 oz (63.4 kg)  SpO2 93%   BMI 20.64 kg/m     Wt Readings from Last 3 Encounters:  11/03/21 139 lb 12.8 oz (63.4 kg)  10/01/21 137 lb (62.1 kg)  09/24/21 148 lb 2.4 oz (67.2 kg)     General: 75 y.o. thin Caucasian male in no acute distress. HEENT: Normocephalic and atraumatic. Sclera clear.  Neck: Supple. No JVD. Heart: Tachycardic with irregularly irregular rhythm. No murmurs, gallops, or rubs. Radial pulses 2+ and equal bilaterally. Lungs: No increased work of breathing. Decreased breath sounds throughout but no wheezes, rhonchi, or rales.  Abdomen: Soft, non-distended, and non-tender to palpation.  MSK: Normal strength and tone for age.  Extremities: No lower extremity edema.    Skin: Warm and dry. Neuro: Alert and oriented x3. No focal deficits. Psych: Normal affect. Responds appropriately.  Assessment:    1. Coronary artery disease involving native coronary artery of native heart without angina pectoris   2. Chronic diastolic CHF (congestive heart failure) (HCC)   3. Persistent atrial fibrillation ([place])   4. Aortic valve stenosis, etiology of cardiac valve disease unspecified   5. Primary hypertension   6. Dyslipidemia   7. Type 2 diabetes mellitus with complication, without long-term current use of insulin ([place])   8. Preop cardiovascular exam   9. Snoring   10. Pulmonary hypertension, unspecified ([place])   11. Tobacco abuse     Plan:    CAD - History of STEMI in 11/2007 s/p DES to LCX.  - No angina.  - Continue aspirin, beta-blocker, high-sensitivity statin.   Chronic Diastolic CHF - Recently admitted with acute diastolic CHF.  - Echo during recent admission showed LVEF of 60-65% with normal wall motion, grade 2 diastolic dysfunction, and flattened interventricular septum in  systole and diastole consistent with RV pressure and volume overload. RV was normal in size with moderately reduced systolic function and moderately elevated PASP.  - Euvolemic on exam.  - Continue Lasix 28m daily. On KCl 20 mEq daily with this. Advised patient that he can take an extra dose of Lasix (with an extra dose of potassium) as needed for weight gain and worsening lower extremity edema.  - Continue Jardiance 152mdaily.  - Discussed importance of daily weight and sodium/fluid restrictions.   Persistent Atrial Fibrillation  - Still in atrial fibrillation today with rates in the 120s.  - Will increase Bystolic to 1076OTialy. - Continue Eliquis 32m332mwice daily.  - Will arrange DCCV. Confirmed that patient has not missed any doses of Eliquis in the last month.  Shared Decision Making/Informed Consent{ The risks (stroke, cardiac arrhythmias rarely resulting in the need for a temporary or permanent pacemaker, skin irritation or burns and complications associated with conscious sedation including aspiration, arrhythmia, respiratory failure and death), benefits (restoration of normal sinus rhythm) and alternatives of a direct current cardioversion were explained in detail to Mr. YorBowdend he agrees to proceed.   Aortic Stenosis  - Mild to moderate AS.  - Will continue routine surveillance.   Hypertension - BP well controlled. - Will increase Bystolic as above for additional heart rate control.  Dyslipidemia - Lipid panel in 05/2021: Total Cholesterol 104, Triglycerides 98, HDL 39, LDL 46.  - LDL goal <70 given history of CAD and CVA. - Continue Crestor 83m30mily. - Labs followed by PCP.  Type 2 Diabetes Mellitus  - Hemoglobin A1c 6.0. - On Jardiance at home.  - Management per PCP.   COPD on Home O2 Pulmonary Hypertension Snoring - Patient  states he is on 4L of O2 at home "more often than not." - Stable.  - Continue home inhalers. - Patient also reports snoring and wife  describes apneic episodes. Suspect patient has untreated sleep apnea which may be contributing to his pulmonary hypertension. Will order sleep study.  Tobacco Abuse - Patient continues to smoke cigars which he has done for over 60 years.  - Discussed importance of complete cessation. Patient is not ready for this at this time. When discussion this, he said "you are probably wasting your time."  Disposition: Follow up in 2-4 weeks after cardioversion.   Medication Adjustments/Labs and Tests Ordered: Current medicines are reviewed at length with the patient today.  Concerns regarding medicines are outlined above.  Orders Placed This Encounter  Procedures   Basic metabolic panel   CBC   EKG 12-Lead   Split night study    Meds ordered this encounter  Medications   nebivolol (BYSTOLIC) 10 MG tablet    Sig: Take 1 tablet (10 mg total) by mouth daily.    Dispense:  90 tablet    Refill:  3     Patient Instructions  Medication Instructions:  INCREASE Bystolic to 10 mg daily May take an extra Lasix for weight gain of 3 lbs overnight, 5 lbs in a week or if you experience swelling of lower extremities  If you have to take an extra dose of Lasix also take an extra does of Potassium *If you need a refill on your cardiac medications before your next appointment, please call your pharmacy*  Lab Work: Your physician recommends that you return for lab work TODAY:  BMET CBC  If you have labs (blood work) drawn today and your tests are completely normal, you will receive your results only by: [employer] (if you have [place]) OR A paper copy in the mail If you have any lab test that is abnormal or we need to change your treatment, we will call you to review the results.  Testing/Procedures: Your physician has recommended that you have a sleep study. This test records several body functions during sleep, including: brain activity, eye movement, oxygen and carbon dioxide blood levels,  heart rate and rhythm, breathing rate and rhythm, the flow of air through your mouth and nose, snoring, body muscle movements, and chest and belly movement.  Your physician has recommended that you have a Cardioversion (DCCV). Electrical Cardioversion uses a jolt of electricity to your heart either through paddles or wired patches attached to your chest. This is a controlled, usually prescheduled, procedure. Defibrillation is done under light anesthesia in the hospital, and you usually go home the day of the procedure. This is done to get your heart back into a normal rhythm. You are not awake for the procedure. Please see the instruction sheet given to you today.  Follow-Up: At [facility], you and your health needs are our priority.  As part of our continuing mission to provide you with exceptional heart care, we have created designated Provider Care Teams.  These Care Teams include your primary Cardiologist (physician) and Advanced Practice Providers (APPs -  Physician Assistants and Nurse Practitioners) who all work together to provide you with the care you need, when you need it.  Your next appointment:   2-3 week(s)  The format for your next appointment:   In Person  Provider:   Donato Heinz, MD  or  APP        Other Instructions   Dear  Laural Golden,  You are scheduled for a Cardioversion on Monday 11/10/21 with Dr. Jenkins Rouge.  Please arrive at the [facility] (Main Entrance A) at [facility]: [address] [place], [place] [id number] at 1:30 PM (1 hour prior to procedure unless lab work is needed; if lab work is needed arrive 1.5 hours ahead)  DIET: Nothing to eat or drink after midnight except a sip of water with medications (see medication instructions below)  FYI: For your safety, and to allow [place] to monitor your vital signs accurately during the surgery/procedure we request that   if you have artificial nails, gel coating, SNS etc. Please have those  removed prior to your surgery/procedure. Not having the nail coverings /polish removed may result in cancellation or delay of your surgery/procedure.   Medication Instructions: Hold Lasix the morning of Cardioversion   Continue your anticoagulant: Eliquis  You will need to continue your anticoagulant after your procedure until you  are told by your  Provider that it is safe to stop  Labs: If patient is on Coumadin, patient needs pt/INR, CBC, BMET within 3 days (No pt/INR needed for patients taking Xarelto, Eliquis, Pradaxa) For patients receiving anesthesia for TEE and all Cardioversion patients: BMET, CBC within 1 week  Come to: [facility] [address] between the hours of 8:00 am and 4:30 pm. You do not have to be fasting.   You must have a responsible person to drive you home and stay in the waiting area during your procedure. Failure to do so could result in cancellation.  Bring your insurance cards.  *Special Note: Every effort is made to have your procedure done on time. Occasionally there are emergencies that occur at the hospital that may cause delays. Please be patient if a delay does occur.     Signed, Darreld Mclean, PA-C  11/03/2021 12:36 PM    [facility]

## 2021-10-25 NOTE — Progress Notes (Signed)
Cardiology Office Note:    Date:  11/03/2021   ID:  Luis Salinas, DOB Jan 25, 1946, MRN [id number]  PCP:  Dettinger, Fransisca Kaufmann, MD  Cardiologist:  Donato Heinz, MD  Electrophysiologist:  None   Referring MD: Dettinger, Fransisca Kaufmann, MD   Chief Complaint: hospital follow-up for CHF and atrial fibrillation  History of Present Illness:    Luis Salinas is a 75 y.o. male with a history of CAD with STEMI in 11/2007 s/p BMS to LCX at [facility], chronic diastolic CHF, paroxysmal atrial fibrillation on Eliquis, CVA, hypertension, dyslipidemia, type 2 diabetes mellitus, COPD with pulmonary hypertension, and tobacco use who is followed by Dr. Gardiner Rhyme and presents today for hospital follow-up of CHF and atrial fibrillation.  Patient has a history of CAD and presented to [place] with a STEMI in 11/2007. Patient underwent successful PCI with BMS to LCX at that time. He has had inconsistent follow-up with Cardiology in the past. He was recently admitted from 09/19/2021 to 09/24/2021 with acute hypoxic respiratory failure secondary to acute diastolic CHF and COPD exacerbation after presenting with worsening shortness of breath, orthopnea, lower extremity edema, and abdominal distension. He was also noted to be in new onset atrial fibrillation on presentation. BNP was elevated at 439. High-sensitivity troponin was minimally elevated and flat at 29 >> 30 consistent with demand ischemia. Echocardiogram showed LVEF of 60-65% with normal wall motion, grade 2 diastolic dysfunction, and flattened interventricular septum in systole and diastole consistent with RV pressure and volume overload. RV was normal in size with moderately reduced systolic function and moderately elevated PASP. Also showed biatrial enlargement and mild to moderate AS. Patient was diuresed with IV Lasix and then transitioned to PO. He was also started on Jardiance and Eliquis. Plans was for outpatient DCCV after 3 weeks of uninterrupted  anticoagulation.   Patient presents today for follow-up. Here with wife. He is doing well since leaving the hospital. He feels like his breathing his good.  He does have chronic dyspnea related to his COPD and is on 4 L of O2 at home "more often than not" since leaving the hospital.  However, this is stable.  He states he has been sleeping in a recliner for the past 2 months but denies any PND. No edema since leaving the hospital. His wife does report snoring and describes some apneic episodes.  He denies any chest pain.  He is still in atrial fibrillation today with rates in the 120s but he is asymptomatic with this.  No palpitations, lightheadedness, dizziness, syncope.  He has been compliant with his Eliquis and has not missed any doses month.  He is also on Aspirin 81 mg daily.  Has any abnormal bleeding with this.  Past Medical History:  Diagnosis Date   Amnesia 1967   Was in a coma and had no memory of the past. His family found him after 27 years   Arthritis    Cataract    Chronic diastolic CHF (congestive heart failure) (HCC)    COPD (chronic obstructive pulmonary disease) ([place])    Dyslipidemia    History of concussion    History of STEMI    a. 11/2007 s/p DES to LCX at [facility]   Hypertension    Paroxysmal atrial fibrillation (HCC)    Pneumonia    exposure to caustic chemicals   Stroke [facility])    multiple strokes   Tobacco abuse    Type 2 diabetes mellitus ([place])     Past Surgical  History:  Procedure Laterality Date   ANTERIOR CERVICAL DECOMP/DISCECTOMY FUSION  05/16/2012   Procedure: ANTERIOR CERVICAL DECOMPRESSION/DISCECTOMY FUSION 2 LEVELS;  Surgeon: Kristeen Miss, MD;  Location: [facility];  Service: Neurosurgery;  Laterality: N/A;  Cervical Five-Six, Cervical Six-Seven Anterior Cervical Decompression/Diskectomy Fusion   CARDIAC CATHETERIZATION     COLONOSCOPY W/ POLYPECTOMY     CORONARY ANGIOPLASTY     EYE SURGERY     cataracts   JOINT REPLACEMENT     Left knee replacement    JOINT REPLACEMENT     left elbow at age 63   ROTATOR CUFF REPAIR     left   TONSILLECTOMY      Current Medications: Current Meds  Medication Sig   Albuterol Sulfate 108 (90 Base) MCG/ACT AEPB Inhale 2 puffs into the lungs every 6 (six) hours as needed (shortness of breath/ wheeze).   apixaban (ELIQUIS) 5 MG TABS tablet Take 1 tablet (5 mg total) by mouth 2 (two) times daily.   aspirin 81 MG EC tablet TAKE 1 TABLET (81 MG TOTAL) BY MOUTH DAILY. SWALLOW WHOLE.   blood glucose meter kit and supplies KIT Dispense based on patient and insurance preference. Use up to four times daily as directed.   Budeson-Glycopyrrol-Formoterol (BREZTRI AEROSPHERE) 160-9-4.8 MCG/ACT AERO Inhale 2 puffs into the lungs 2 (two) times daily.   diphenhydramine-acetaminophen (TYLENOL PM) 25-500 MG TABS tablet Take 1 tablet by mouth at bedtime as needed.   empagliflozin (JARDIANCE) 10 MG TABS tablet Take 1 tablet (10 mg total) by mouth daily.   furosemide (LASIX) 40 MG tablet Take 1 tablet (40 mg total) by mouth daily.   Multiple Vitamins-Minerals (ICAPS AREDS FORMULA PO) Take 1 capsule by mouth 2 (two) times daily.   nitroGLYCERIN (NITROSTAT) 0.4 MG SL tablet Place 1 tablet (0.4 mg total) under the tongue every 5 (five) minutes as needed for chest pain.   potassium chloride SA (KLOR-CON) 20 MEQ tablet Take 1 tablet (20 mEq total) by mouth daily.   rosuvastatin (CRESTOR) 40 MG tablet Take 1 tablet (40 mg total) by mouth daily. (Patient taking differently: Take 40 mg by mouth at bedtime.)   [DISCONTINUED] nebivolol (BYSTOLIC) 5 MG tablet Take 1 tablet (5 mg total) by mouth daily.     Allergies:   Patient has no known allergies.   Social History   Socioeconomic History   Marital status: Married    Spouse name: Luis Salinas   Number of children: 7   Years of education: college   Highest education level: Some college, no degree  Occupational History   Occupation: Retired    Comment: Museum/gallery curator  Tobacco Use   Smoking  status: Light Smoker    Types: Cigars    Last attempt to quit: 04/03/2013    Years since quitting: 8.5   Smokeless tobacco: Never   Tobacco comments:    One cigar per day.  Smoked 40 years cigarettes.  10 years ago quit cigarettes.  Vaping Use   Vaping Use: Never used  Substance and Sexual Activity   Alcohol use: No    Alcohol/week: 0.0 standard drinks   Drug use: No    Types: Marijuana   Sexual activity: Yes  Other Topics Concern   Not on file  Social History Narrative   Lives with his fourth wife.    Social Determinants of Health   Financial Resource Strain: Low Risk    Difficulty of Paying Living Expenses: Not hard at all  Food Insecurity: No Food Insecurity  Worried About Charity fundraiser in the Last Year: Never true   [place] in the Last Year: Never true  Transportation Needs: No Transportation Needs   Lack of Transportation (Medical): No   Lack of Transportation (Non-Medical): No  Physical Activity: Insufficiently Active   Days of Exercise per Week: 7 days   Minutes of Exercise per Session: 10 min  Stress: No Stress Concern Present   Feeling of Stress : Not at all  Social Connections: Socially Integrated   Frequency of Communication with Friends and Family: More than three times a week   Frequency of Social Gatherings with Friends and Family: More than three times a week   Attends Religious Services: 1 to 4 times per year   Active Member of [employer] or Organizations: Yes   Attends Archivist Meetings: 1 to 4 times per year   Marital Status: Married     Family History: The patient's family history includes Aneurysm in his father; COPD in his father; Heart disease in his mother. There is no history of Anesthesia problems, Hypotension, Malignant hyperthermia, or Pseudochol deficiency.  ROS:   Please see the history of present illness.     EKGs/Labs/Other Studies Reviewed:    The following studies were reviewed today:  Echocardiogram  09/20/2021: Impressions:  1. Left ventricular ejection fraction, by estimation, is 60 to 65%. The  left ventricle has normal function. The left ventricle has no regional  wall motion abnormalities. There is mild left ventricular hypertrophy.  Left ventricular diastolic parameters  are consistent with Grade II diastolic dysfunction (pseudonormalization).  There is the interventricular septum is flattened in systole and diastole,  consistent with right ventricular pressure and volume overload.   2. Right ventricular systolic function is moderately reduced. The right  ventricular size is normal. There is moderately elevated pulmonary artery  systolic pressure.   3. Left atrial size was severely dilated.   4. Right atrial size was moderately dilated.   5. The mitral valve is normal in structure. No evidence of mitral valve  regurgitation. No evidence of mitral stenosis.   6. The aortic valve is tricuspid. There is moderate calcification of the  aortic valve. There is moderate thickening of the aortic valve. Aortic  valve regurgitation is not visualized. Mild to moderate aortic valve  stenosis.   7. The inferior vena cava is dilated in size with <50% respiratory  variability, suggesting right atrial pressure of 15 mmHg.  EKG:  EKG ordered today. EKG personally reviewed and demonstrates atrial fibrillation, rate 124 bpm, with non-specific ST/T changes.   Recent Labs: 09/19/2021: B Natriuretic Peptide 439.0 09/20/2021: TSH 6.972 09/23/2021: Magnesium 2.4 10/01/2021: ALT 67; BUN 21; Creatinine, Ser 0.89; Hemoglobin 14.8; Platelets 205; Potassium 4.7; Sodium 139  Recent Lipid Panel    Component Value Date/Time   CHOL 104 05/23/2021 0816   CHOL 114 05/01/2013 1302   TRIG 98 05/23/2021 0816   TRIG 88 03/29/2015 1106   TRIG 141 05/01/2013 1302   HDL 39 (L) 05/23/2021 0816   HDL 38 (L) 03/29/2015 1106   HDL 34 (L) 05/01/2013 1302   CHOLHDL 2.7 05/23/2021 0816   CHOLHDL 4.0 03/17/2013 0415    VLDL 12 03/17/2013 0415   LDLCALC 46 05/23/2021 0816   LDLCALC 52 05/01/2013 1302    Physical Exam:    Vital Signs: BP 124/62   Pulse (!) 124   Ht 5' 9"  (1.753 m)   Wt 139 lb 12.8 oz (63.4 kg)  SpO2 93%   BMI 20.64 kg/m     Wt Readings from Last 3 Encounters:  11/03/21 139 lb 12.8 oz (63.4 kg)  10/01/21 137 lb (62.1 kg)  09/24/21 148 lb 2.4 oz (67.2 kg)     General: 75 y.o. thin Caucasian male in no acute distress. HEENT: Normocephalic and atraumatic. Sclera clear.  Neck: Supple. No JVD. Heart: Tachycardic with irregularly irregular rhythm. No murmurs, gallops, or rubs. Radial pulses 2+ and equal bilaterally. Lungs: No increased work of breathing. Decreased breath sounds throughout but no wheezes, rhonchi, or rales.  Abdomen: Soft, non-distended, and non-tender to palpation.  MSK: Normal strength and tone for age.  Extremities: No lower extremity edema.    Skin: Warm and dry. Neuro: Alert and oriented x3. No focal deficits. Psych: Normal affect. Responds appropriately.  Assessment:    1. Coronary artery disease involving native coronary artery of native heart without angina pectoris   2. Chronic diastolic CHF (congestive heart failure) (HCC)   3. Persistent atrial fibrillation ([place])   4. Aortic valve stenosis, etiology of cardiac valve disease unspecified   5. Primary hypertension   6. Dyslipidemia   7. Type 2 diabetes mellitus with complication, without long-term current use of insulin ([place])   8. Preop cardiovascular exam   9. Snoring   10. Pulmonary hypertension, unspecified ([place])   11. Tobacco abuse     Plan:    CAD - History of STEMI in 11/2007 s/p DES to LCX.  - No angina.  - Continue aspirin, beta-blocker, high-sensitivity statin.   Chronic Diastolic CHF - Recently admitted with acute diastolic CHF.  - Echo during recent admission showed LVEF of 60-65% with normal wall motion, grade 2 diastolic dysfunction, and flattened interventricular septum in  systole and diastole consistent with RV pressure and volume overload. RV was normal in size with moderately reduced systolic function and moderately elevated PASP.  - Euvolemic on exam.  - Continue Lasix 62m daily. On KCl 20 mEq daily with this. Advised patient that he can take an extra dose of Lasix (with an extra dose of potassium) as needed for weight gain and worsening lower extremity edema.  - Continue Jardiance 167mdaily.  - Discussed importance of daily weight and sodium/fluid restrictions.   Persistent Atrial Fibrillation  - Still in atrial fibrillation today with rates in the 120s.  - Will increase Bystolic to 1029HBialy. - Continue Eliquis 75m42mwice daily.  - Will arrange DCCV. Confirmed that patient has not missed any doses of Eliquis in the last month.  Shared Decision Making/Informed Consent{ The risks (stroke, cardiac arrhythmias rarely resulting in the need for a temporary or permanent pacemaker, skin irritation or burns and complications associated with conscious sedation including aspiration, arrhythmia, respiratory failure and death), benefits (restoration of normal sinus rhythm) and alternatives of a direct current cardioversion were explained in detail to Luis Salinas he agrees to proceed.   Aortic Stenosis  - Mild to moderate AS.  - Will continue routine surveillance.   Hypertension - BP well controlled. - Will increase Bystolic as above for additional heart rate control.  Dyslipidemia - Lipid panel in 05/2021: Total Cholesterol 104, Triglycerides 98, HDL 39, LDL 46.  - LDL goal <70 given history of CAD and CVA. - Continue Crestor 68m29mily. - Labs followed by PCP.  Type 2 Diabetes Mellitus  - Hemoglobin A1c 6.0. - On Jardiance at home.  - Management per PCP.   COPD on Home O2 Pulmonary Hypertension Snoring - Patient  states he is on 4L of O2 at home "more often than not." - Stable.  - Continue home inhalers. - Patient also reports snoring and wife  describes apneic episodes. Suspect patient has untreated sleep apnea which may be contributing to his pulmonary hypertension. Will order sleep study.  Tobacco Abuse - Patient continues to smoke cigars which he has done for over 60 years.  - Discussed importance of complete cessation. Patient is not ready for this at this time. When discussion this, he said "you are probably wasting your time."  Disposition: Follow up in 2-4 weeks after cardioversion.   Medication Adjustments/Labs and Tests Ordered: Current medicines are reviewed at length with the patient today.  Concerns regarding medicines are outlined above.  Orders Placed This Encounter  Procedures   Basic metabolic panel   CBC   EKG 12-Lead   Split night study    Meds ordered this encounter  Medications   nebivolol (BYSTOLIC) 10 MG tablet    Sig: Take 1 tablet (10 mg total) by mouth daily.    Dispense:  90 tablet    Refill:  3     Patient Instructions  Medication Instructions:  INCREASE Bystolic to 10 mg daily May take an extra Lasix for weight gain of 3 lbs overnight, 5 lbs in a week or if you experience swelling of lower extremities  If you have to take an extra dose of Lasix also take an extra does of Potassium *If you need a refill on your cardiac medications before your next appointment, please call your pharmacy*  Lab Work: Your physician recommends that you return for lab work TODAY:  BMET CBC  If you have labs (blood work) drawn today and your tests are completely normal, you will receive your results only by: [employer] (if you have [place]) OR A paper copy in the mail If you have any lab test that is abnormal or we need to change your treatment, we will call you to review the results.  Testing/Procedures: Your physician has recommended that you have a sleep study. This test records several body functions during sleep, including: brain activity, eye movement, oxygen and carbon dioxide blood levels,  heart rate and rhythm, breathing rate and rhythm, the flow of air through your mouth and nose, snoring, body muscle movements, and chest and belly movement.  Your physician has recommended that you have a Cardioversion (DCCV). Electrical Cardioversion uses a jolt of electricity to your heart either through paddles or wired patches attached to your chest. This is a controlled, usually prescheduled, procedure. Defibrillation is done under light anesthesia in the hospital, and you usually go home the day of the procedure. This is done to get your heart back into a normal rhythm. You are not awake for the procedure. Please see the instruction sheet given to you today.  Follow-Up: At [facility], you and your health needs are our priority.  As part of our continuing mission to provide you with exceptional heart care, we have created designated Provider Care Teams.  These Care Teams include your primary Cardiologist (physician) and Advanced Practice Providers (APPs -  Physician Assistants and Nurse Practitioners) who all work together to provide you with the care you need, when you need it.  Your next appointment:   2-3 week(s)  The format for your next appointment:   In Person  Provider:   Donato Heinz, MD  or  APP        Other Instructions   Dear  Luis Salinas,  You are scheduled for a Cardioversion on Monday 11/10/21 with Dr. Jenkins Rouge.  Please arrive at the [facility] (Main Entrance A) at [facility]: [address] [place], [place] [address] at 1:30 PM (1 hour prior to procedure unless lab work is needed; if lab work is needed arrive 1.5 hours ahead)  DIET: Nothing to eat or drink after midnight except a sip of water with medications (see medication instructions below)  FYI: For your safety, and to allow [place] to monitor your vital signs accurately during the surgery/procedure we request that   if you have artificial nails, gel coating, SNS etc. Please have those  removed prior to your surgery/procedure. Not having the nail coverings /polish removed may result in cancellation or delay of your surgery/procedure.   Medication Instructions: Hold Lasix the morning of Cardioversion   Continue your anticoagulant: Eliquis  You will need to continue your anticoagulant after your procedure until you  are told by your  Provider that it is safe to stop  Labs: If patient is on Coumadin, patient needs pt/INR, CBC, BMET within 3 days (No pt/INR needed for patients taking Xarelto, Eliquis, Pradaxa) For patients receiving anesthesia for TEE and all Cardioversion patients: BMET, CBC within 1 week  Come to: [place] [address] between the hours of 8:00 am and 4:30 pm. You do not have to be fasting.   You must have a responsible person to drive you home and stay in the waiting area during your procedure. Failure to do so could result in cancellation.  Bring your insurance cards.  *Special Note: Every effort is made to have your procedure done on time. Occasionally there are emergencies that occur at the hospital that may cause delays. Please be patient if a delay does occur.     Signed, Darreld Mclean, PA-C  11/03/2021 12:36 PM    [facility]

## 2021-11-01 ENCOUNTER — Encounter: Payer: Self-pay | Admitting: Student

## 2021-11-01 DIAGNOSIS — I639 Cerebral infarction, unspecified: Secondary | ICD-10-CM | POA: Insufficient documentation

## 2021-11-01 DIAGNOSIS — E119 Type 2 diabetes mellitus without complications: Secondary | ICD-10-CM | POA: Insufficient documentation

## 2021-11-01 DIAGNOSIS — I219 Acute myocardial infarction, unspecified: Secondary | ICD-10-CM | POA: Insufficient documentation

## 2021-11-03 ENCOUNTER — Ambulatory Visit: Payer: Medicare Other | Admitting: Student

## 2021-11-03 ENCOUNTER — Encounter: Payer: Self-pay | Admitting: Student

## 2021-11-03 ENCOUNTER — Other Ambulatory Visit: Payer: Self-pay

## 2021-11-03 VITALS — BP 124/62 | HR 124 | Ht 69.0 in | Wt 139.8 lb

## 2021-11-03 DIAGNOSIS — R0683 Snoring: Secondary | ICD-10-CM

## 2021-11-03 DIAGNOSIS — I2510 Atherosclerotic heart disease of native coronary artery without angina pectoris: Secondary | ICD-10-CM | POA: Diagnosis not present

## 2021-11-03 DIAGNOSIS — Z01818 Encounter for other preprocedural examination: Secondary | ICD-10-CM | POA: Diagnosis not present

## 2021-11-03 DIAGNOSIS — E785 Hyperlipidemia, unspecified: Secondary | ICD-10-CM

## 2021-11-03 DIAGNOSIS — I350 Nonrheumatic aortic (valve) stenosis: Secondary | ICD-10-CM | POA: Diagnosis not present

## 2021-11-03 DIAGNOSIS — I272 Pulmonary hypertension, unspecified: Secondary | ICD-10-CM

## 2021-11-03 DIAGNOSIS — Z01810 Encounter for preprocedural cardiovascular examination: Secondary | ICD-10-CM

## 2021-11-03 DIAGNOSIS — I639 Cerebral infarction, unspecified: Secondary | ICD-10-CM

## 2021-11-03 DIAGNOSIS — I4819 Other persistent atrial fibrillation: Secondary | ICD-10-CM | POA: Diagnosis not present

## 2021-11-03 DIAGNOSIS — E118 Type 2 diabetes mellitus with unspecified complications: Secondary | ICD-10-CM | POA: Diagnosis not present

## 2021-11-03 DIAGNOSIS — Z720 Tobacco use: Secondary | ICD-10-CM

## 2021-11-03 DIAGNOSIS — I10 Essential (primary) hypertension: Secondary | ICD-10-CM | POA: Diagnosis not present

## 2021-11-03 DIAGNOSIS — I5032 Chronic diastolic (congestive) heart failure: Secondary | ICD-10-CM | POA: Diagnosis not present

## 2021-11-03 DIAGNOSIS — I213 ST elevation (STEMI) myocardial infarction of unspecified site: Secondary | ICD-10-CM

## 2021-11-03 MED ORDER — NEBIVOLOL HCL 10 MG PO TABS: 10.0000 mg | ORAL_TABLET | Freq: Every day | ORAL | 3 refills | Status: DC

## 2021-11-03 NOTE — Patient Instructions (Addendum)
Medication Instructions:  INCREASE Bystolic to 10 mg daily May take an extra Lasix for weight gain of 3 lbs overnight, 5 lbs in a week or if you experience swelling of lower extremities  If you have to take an extra dose of Lasix also take an extra does of Potassium *If you need a refill on your cardiac medications before your next appointment, please call your pharmacy*  Lab Work: Your physician recommends that you return for lab work TODAY:  BMET CBC  If you have labs (blood work) drawn today and your tests are completely normal, you will receive your results only by: [employer] (if you have MyChart) OR A paper copy in the mail If you have any lab test that is abnormal or we need to change your treatment, we will call you to review the results.  Testing/Procedures: Your physician has recommended that you have a sleep study. This test records several body functions during sleep, including: brain activity, eye movement, oxygen and carbon dioxide blood levels, heart rate and rhythm, breathing rate and rhythm, the flow of air through your mouth and nose, snoring, body muscle movements, and chest and belly movement.  Your physician has recommended that you have a Cardioversion (DCCV). Electrical Cardioversion uses a jolt of electricity to your heart either through paddles or wired patches attached to your chest. This is a controlled, usually prescheduled, procedure. Defibrillation is done under light anesthesia in the hospital, and you usually go home the day of the procedure. This is done to get your heart back into a normal rhythm. You are not awake for the procedure. Please see the instruction sheet given to you today.  Follow-Up: At [facility], you and your health needs are our priority.  As part of our continuing mission to provide you with exceptional heart care, we have created designated Provider Care Teams.  These Care Teams include your primary Cardiologist (physician) and  Advanced Practice Providers (APPs -  Physician Assistants and Nurse Practitioners) who all work together to provide you with the care you need, when you need it.  Your next appointment:   2-3 week(s)  The format for your next appointment:   In Person  Provider:   Little Ishikawa, MD  or  APP        Other Instructions   Dear Luis Salinas,  You are scheduled for a Cardioversion on Monday 11/10/21 with Dr. Charlton Haws.  Please arrive at the [facility] (Main Entrance A) at [facility]: [address] [place], [place] [address] at 1:30 PM (1 hour prior to procedure unless lab work is needed; if lab work is needed arrive 1.5 hours ahead)  DIET: Nothing to eat or drink after midnight except a sip of water with medications (see medication instructions below)  FYI: For your safety, and to allow [place] to monitor your vital signs accurately during the surgery/procedure we request that   if you have artificial nails, gel coating, SNS etc. Please have those removed prior to your surgery/procedure. Not having the nail coverings /polish removed may result in cancellation or delay of your surgery/procedure.   Medication Instructions: Hold Lasix the morning of Cardioversion   Continue your anticoagulant: Eliquis  You will need to continue your anticoagulant after your procedure until you  are told by your  Provider that it is safe to stop  Labs: If patient is on Coumadin, patient needs pt/INR, CBC, BMET within 3 days (No pt/INR needed for patients taking  Xarelto, Eliquis, Pradaxa) For patients receiving anesthesia for TEE and all Cardioversion patients: BMET, CBC within 1 week  Come to: [place] [address] between the hours of 8:00 am and 4:30 pm. You do not have to be fasting.   You must have a responsible person to drive you home and stay in the waiting area during your procedure. Failure to do so could result in cancellation.  Bring your insurance cards.  *Special  Note: Every effort is made to have your procedure done on time. Occasionally there are emergencies that occur at the hospital that may cause delays. Please be patient if a delay does occur.

## 2021-11-03 NOTE — Addendum Note (Signed)
Addended by: Dorris Fetch on: 11/03/2021 03:00 PM   Modules accepted: Orders

## 2021-11-04 LAB — BASIC METABOLIC PANEL
BUN/Creatinine Ratio: 18 (ref 10–24)
BUN: 15 mg/dL (ref 8–27)
CO2: 28 mmol/L (ref 20–29)
Calcium: 9.7 mg/dL (ref 8.6–10.2)
Chloride: 102 mmol/L (ref 96–106)
Creatinine, Ser: 0.83 mg/dL (ref 0.76–1.27)
Glucose: 95 mg/dL (ref 70–99)
Potassium: 4.8 mmol/L (ref 3.5–5.2)
Sodium: 144 mmol/L (ref 134–144)
eGFR: 91 mL/min/{1.73_m2} (ref >59)

## 2021-11-04 LAB — CBC
Hematocrit: 45.9 % (ref 37.5–51.0)
Hemoglobin: 14.4 g/dL (ref 13.0–17.7)
MCH: 27.3 pg (ref 26.6–33.0)
MCHC: 31.4 g/dL — ABNORMAL LOW (ref 31.5–35.7)
MCV: 87 fL (ref 79–97)
Platelets: 188 10*3/uL (ref 150–450)
RBC: 5.28 x10E6/uL (ref 4.14–5.80)
RDW: 14.6 % (ref 11.6–15.4)
WBC: 10.2 10*3/uL (ref 3.4–10.8)

## 2021-11-05 DIAGNOSIS — J449 Chronic obstructive pulmonary disease, unspecified: Secondary | ICD-10-CM | POA: Diagnosis not present

## 2021-11-10 ENCOUNTER — Ambulatory Visit (HOSPITAL_COMMUNITY)
Admission: RE | Admit: 2021-11-10 | Discharge: 2021-11-10 | Disposition: A | Discharge status: Home / Self Care | Payer: Medicare Other | Attending: Cardiovascular Disease | Admitting: Cardiovascular Disease

## 2021-11-10 ENCOUNTER — Ambulatory Visit (HOSPITAL_COMMUNITY): Payer: Medicare Other | Admitting: Anesthesiology

## 2021-11-10 ENCOUNTER — Encounter (HOSPITAL_COMMUNITY): Payer: Self-pay | Admitting: Cardiovascular Disease

## 2021-11-10 ENCOUNTER — Encounter (HOSPITAL_COMMUNITY)
Admission: RE | Disposition: A | Discharge status: Home / Self Care | Payer: Self-pay | Source: Home / Self Care | Attending: Cardiovascular Disease

## 2021-11-10 ENCOUNTER — Telehealth: Payer: Self-pay | Admitting: Adult Health

## 2021-11-10 DIAGNOSIS — F17290 Nicotine dependence, other tobacco product, uncomplicated: Secondary | ICD-10-CM | POA: Insufficient documentation

## 2021-11-10 DIAGNOSIS — E119 Type 2 diabetes mellitus without complications: Secondary | ICD-10-CM | POA: Diagnosis not present

## 2021-11-10 DIAGNOSIS — I252 Old myocardial infarction: Secondary | ICD-10-CM | POA: Diagnosis not present

## 2021-11-10 DIAGNOSIS — I350 Nonrheumatic aortic (valve) stenosis: Secondary | ICD-10-CM | POA: Insufficient documentation

## 2021-11-10 DIAGNOSIS — E785 Hyperlipidemia, unspecified: Secondary | ICD-10-CM | POA: Diagnosis not present

## 2021-11-10 DIAGNOSIS — Z9981 Dependence on supplemental oxygen: Secondary | ICD-10-CM | POA: Insufficient documentation

## 2021-11-10 DIAGNOSIS — I4819 Other persistent atrial fibrillation: Secondary | ICD-10-CM | POA: Diagnosis not present

## 2021-11-10 DIAGNOSIS — I4891 Unspecified atrial fibrillation: Secondary | ICD-10-CM

## 2021-11-10 DIAGNOSIS — I110 Hypertensive heart disease with heart failure: Secondary | ICD-10-CM | POA: Diagnosis not present

## 2021-11-10 DIAGNOSIS — I272 Pulmonary hypertension, unspecified: Secondary | ICD-10-CM | POA: Diagnosis not present

## 2021-11-10 DIAGNOSIS — I5032 Chronic diastolic (congestive) heart failure: Secondary | ICD-10-CM | POA: Insufficient documentation

## 2021-11-10 DIAGNOSIS — R0683 Snoring: Secondary | ICD-10-CM | POA: Insufficient documentation

## 2021-11-10 DIAGNOSIS — J449 Chronic obstructive pulmonary disease, unspecified: Secondary | ICD-10-CM | POA: Diagnosis not present

## 2021-11-10 DIAGNOSIS — I2510 Atherosclerotic heart disease of native coronary artery without angina pectoris: Secondary | ICD-10-CM | POA: Insufficient documentation

## 2021-11-10 DIAGNOSIS — I480 Paroxysmal atrial fibrillation: Secondary | ICD-10-CM

## 2021-11-10 HISTORY — PX: CARDIOVERSION: SHX1299

## 2021-11-10 SURGERY — CARDIOVERSION
Anesthesia: General

## 2021-11-10 MED ORDER — PROPOFOL 10 MG/ML IV BOLUS
INTRAVENOUS | Status: DC | PRN
  Administered 2021-11-10: 50 mg via INTRAVENOUS

## 2021-11-10 MED ORDER — LIDOCAINE 2% (20 MG/ML) 5 ML SYRINGE
INTRAMUSCULAR | Status: DC | PRN
  Administered 2021-11-10: 60 mg via INTRAVENOUS

## 2021-11-10 MED ORDER — SODIUM CHLORIDE 0.9 % IV SOLN: INTRAVENOUS | Status: DC

## 2021-11-10 NOTE — Telephone Encounter (Signed)
Patient's wife called and wanted to know how the patient should take his Eliquis before he has his cardioversion. Please call back

## 2021-11-10 NOTE — Interval H&P Note (Signed)
History and Physical Interval Note:  11/10/2021 1:49 PM  Luis Salinas  has presented today for surgery, with the diagnosis of afib.  The various methods of treatment have been discussed with the patient and family. After consideration of risks, benefits and other options for treatment, the patient has consented to  Procedure(s): CARDIOVERSION (N/A) as a surgical intervention.  The patient's history has been reviewed, patient examined, no change in status, stable for surgery.  I have reviewed the patient's chart and labs.  Questions were answered to the patient's satisfaction.     Charlton Haws

## 2021-11-10 NOTE — Discharge Instructions (Signed)

## 2021-11-10 NOTE — Anesthesia Postprocedure Evaluation (Signed)
Anesthesia Post Note  Patient: Luis Salinas  Procedure(s) Performed: CARDIOVERSION     Patient location during evaluation: PACU Anesthesia Type: General Level of consciousness: awake Pain management: pain level controlled Vital Signs Assessment: post-procedure vital signs reviewed and stable Respiratory status: spontaneous breathing and respiratory function stable Cardiovascular status: stable Postop Assessment: no apparent nausea or vomiting Anesthetic complications: no   No notable events documented.  Last Vitals:  Vitals:   11/10/21 1429 11/10/21 1439  BP: (!) 100/56 128/69  Pulse: 80 76  Resp: (!) 21 (!) 22  Temp:    SpO2: 95% 90%    Last Pain:  Vitals:   11/10/21 1439  TempSrc:   PainSc: 0-No pain                 Mellody Dance

## 2021-11-10 NOTE — Progress Notes (Signed)
Per pt, he is oxygen dependent on 4 LPM via [place] at home (titrated for O2 saturation >90%). Pt's O2 saturation post-cardioversion is 89-92% on room air. Pt is awake, alert, and oriented x4. MDA Bass notified and stated that she is comfortable with discharge per endoscopy recovery criteria.  Eulas Post, BSN, RN

## 2021-11-10 NOTE — Anesthesia Procedure Notes (Signed)
Procedure Name: MAC Date/Time: 11/10/2021 2:00 PM Performed by: Jenne Campus, CRNA Pre-anesthesia Checklist: Patient identified, Emergency Drugs available, Suction available and Patient being monitored Patient Re-evaluated:Patient Re-evaluated prior to induction Oxygen Delivery Method: Ambu bag Preoxygenation: Pre-oxygenation with 100% oxygen Induction Type: IV induction

## 2021-11-10 NOTE — Anesthesia Preprocedure Evaluation (Addendum)
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 on medications + CAD, + Past MI and +CHF  + dysrhythmias Atrial Fibrillation  Rhythm:Irregular Rate:Normal     Neuro/Psych CVA negative psych ROS   GI/Hepatic negative GI ROS, Neg liver ROS,   Endo/Other  diabetes  Renal/GU negative Renal ROS  negative genitourinary   Musculoskeletal  (+) Arthritis ,   Abdominal   Peds  Hematology negative hematology ROS (+)   Anesthesia Other Findings   Reproductive/Obstetrics                         Anesthesia Physical Anesthesia Plan  ASA: 3  Anesthesia Plan: General   Post-op Pain Management:    Induction: Intravenous  PONV Risk Score and Plan: 1 and Treatment may vary due to age or medical condition  Airway Management Planned: Mask  Additional Equipment: None  Intra-op Plan:   Post-operative Plan:   Informed Consent: I have reviewed the patients History and Physical, chart, labs and discussed the procedure including the risks, benefits and alternatives for the proposed anesthesia with the patient or authorized representative who has indicated his/her understanding and acceptance.     Dental advisory given  Plan Discussed with: CRNA, Anesthesiologist and Surgeon  Anesthesia Plan Comments:         Anesthesia Quick Evaluation

## 2021-11-10 NOTE — Transfer of Care (Signed)
Immediate Anesthesia Transfer of Care Note  Patient: Luis Salinas  Procedure(s) Performed: CARDIOVERSION  Patient Location: Endoscopy Unit  Anesthesia Type:General  Level of Consciousness: awake, oriented and patient cooperative  Airway & Oxygen Therapy: Patient Spontanous Breathing and Patient connected to nasal cannula oxygen  Post-op Assessment: Report given to RN and Post -op Vital signs reviewed and stable  Post vital signs: Reviewed  Last Vitals:  Vitals Value Taken Time  BP    Temp    Pulse    Resp    SpO2      Last Pain:  Vitals:   11/10/21 1300  TempSrc: Temporal  PainSc: 0-No pain         Complications: No notable events documented.

## 2021-11-10 NOTE — Telephone Encounter (Signed)
Wife notified that patient is to take Eliquis prior to cardioversion.

## 2021-11-12 ENCOUNTER — Encounter (HOSPITAL_COMMUNITY): Payer: Self-pay | Admitting: Cardiovascular Disease

## 2021-11-12 NOTE — CV Procedure (Signed)
DCC: Anesthesia: Propofol On Rx anticoagulation with no missed doses  DCC x 1  Converted from afib rate 95 to NSR rate 78 bpm No immediate neurologic sequelae  Charlton Haws MD [facility]

## 2021-11-26 ENCOUNTER — Encounter: Payer: Self-pay | Admitting: Family Medicine

## 2021-11-26 ENCOUNTER — Ambulatory Visit (INDEPENDENT_AMBULATORY_CARE_PROVIDER_SITE_OTHER): Payer: Medicare Other | Admitting: Family Medicine

## 2021-11-26 ENCOUNTER — Other Ambulatory Visit: Payer: Self-pay

## 2021-11-26 VITALS — BP 99/67 | HR 68 | Ht 69.0 in | Wt 138.0 lb

## 2021-11-26 DIAGNOSIS — I480 Paroxysmal atrial fibrillation: Secondary | ICD-10-CM | POA: Diagnosis not present

## 2021-11-26 DIAGNOSIS — E1169 Type 2 diabetes mellitus with other specified complication: Secondary | ICD-10-CM

## 2021-11-26 DIAGNOSIS — I10 Essential (primary) hypertension: Secondary | ICD-10-CM

## 2021-11-26 DIAGNOSIS — E785 Hyperlipidemia, unspecified: Secondary | ICD-10-CM

## 2021-11-26 DIAGNOSIS — E782 Mixed hyperlipidemia: Secondary | ICD-10-CM | POA: Diagnosis not present

## 2021-11-26 DIAGNOSIS — I5032 Chronic diastolic (congestive) heart failure: Secondary | ICD-10-CM | POA: Diagnosis not present

## 2021-11-26 LAB — BAYER DCA HB A1C WAIVED: HB A1C (BAYER DCA - WAIVED): 6.1 % — ABNORMAL HIGH (ref 4.8–5.6)

## 2021-11-26 MED ORDER — NITROGLYCERIN 0.4 MG SL SUBL: 0.4000 mg | SUBLINGUAL_TABLET | SUBLINGUAL | 3 refills | Status: DC | PRN

## 2021-11-26 MED ORDER — ROSUVASTATIN CALCIUM 40 MG PO TABS: 40.0000 mg | ORAL_TABLET | Freq: Every day | ORAL | 3 refills | Status: DC

## 2021-11-26 NOTE — Progress Notes (Signed)
BP 99/67    Pulse 68    Ht _0  (1.753 m)    Wt 138 lb (62.6 kg)    SpO2 (!) 88%    BMI 20.38 kg/m    Subjective:   [name], male    DOB: July 31, 1946, 75 y.o.   MRN: [id number]  HPI: Luis Salinas is a 75 y.o. male presenting on 11/26/2021 for Medical Management of Chronic Issues, Hyperlipidemia, and Hypertension   HPI Hypertension and CHF and A. fib Patient is currently on furosemide and Bystolic and Eliquis, had recent cardioversion and has follow-up with cardiology on Monday, and their blood pressure today is 99/67. Patient denies any lightheadedness or dizziness. Patient denies headaches, blurred vision, chest pains, shortness of breath, or weakness. Denies any side effects from medication and is content with current medication.   Type 2 diabetes mellitus Patient comes in today for recheck of his diabetes. Patient has been currently taking Jardiance. Patient is not currently on an ACE inhibitor/ARB. Patient has not seen an ophthalmologist this year. Patient denies any issues with their feet. The symptom started onset as an adult hyperlipidemia and hypertension and CHF and A. fib and CAD ARE RELATED TO DM   Hyperlipidemia Patient is coming in for recheck of his hyperlipidemia. The patient is currently taking Crestor. They deny any issues with myalgias or history of liver damage from it. They deny any focal numbness or weakness or chest pain.   COPD Patient is coming in for COPD recheck today.  He is currently on albuterol and Breztri.  He has a mild chronic cough but denies any major coughing spells or wheezing spells.  He has 1nighttime symptoms per week and 2daytime symptoms per week currently.  Patient still uses 2 L nasal cannula at night to help.  He feels like he does pretty well with that and its been stable.  Relevant past medical, surgical, family and social history reviewed and updated as indicated. Interim medical history since our last visit  reviewed. Allergies and medications reviewed and updated.  Review of Systems  Constitutional:  Negative for chills and fever.  HENT:  Negative for congestion.   Respiratory:  Positive for cough and shortness of breath. Negative for chest tightness and wheezing.   Cardiovascular:  Negative for chest pain and leg swelling.  Musculoskeletal:  Negative for arthralgias, back pain and gait problem.  Skin:  Negative for rash.  All other systems reviewed and are negative.  Per HPI unless specifically indicated above   Allergies as of 11/26/2021   No Known Allergies      Medication List        Accurate as of November 26, 2021  8:48 AM. If you have any questions, ask your nurse or doctor.          Albuterol Sulfate 108 (90 Base) MCG/ACT Aepb Commonly known as: PROAIR RESPICLICK Inhale 2 puffs into the lungs every 6 (six) hours as needed (shortness of breath/ wheeze).   apixaban 5 MG Tabs tablet Commonly known as: ELIQUIS Take 1 tablet (5 mg total) by mouth 2 (two) times daily.   aspirin 81 MG EC tablet TAKE 1 TABLET (81 MG TOTAL) BY MOUTH DAILY. SWALLOW WHOLE.   blood glucose meter kit and supplies Kit Dispense based on patient and insurance preference. Use up to four times daily as directed.   Breztri Aerosphere 160-9-4.8 MCG/ACT Aero Generic drug: Budeson-Glycopyrrol-Formoterol Inhale 2 puffs into the lungs 2 (two) times daily.  diphenhydramine-acetaminophen 25-500 MG Tabs tablet Commonly known as: TYLENOL PM Take 1 tablet by mouth at bedtime as needed (sleep/pain.).   empagliflozin 10 MG Tabs tablet Commonly known as: JARDIANCE Take 1 tablet (10 mg total) by mouth daily.   furosemide 40 MG tablet Commonly known as: LASIX Take 1 tablet (40 mg total) by mouth daily.   ICAPS AREDS FORMULA PO Take 1 capsule by mouth 2 (two) times daily.   nebivolol 10 MG tablet Commonly known as: Bystolic Take 1 tablet (10 mg total) by mouth daily.   nitroGLYCERIN 0.4 MG SL  tablet Commonly known as: NITROSTAT Place 1 tablet (0.4 mg total) under the tongue every 5 (five) minutes as needed for chest pain.   OXYGEN Inhale 4 L into the lungs daily.   potassium chloride SA 20 MEQ tablet Commonly known as: KLOR-CON M Take 1 tablet (20 mEq total) by mouth daily.   rosuvastatin 40 MG tablet Commonly known as: CRESTOR Take 1 tablet (40 mg total) by mouth daily. What changed: when to take this   Salonpas Pain Relief Patch Ptch Place 1 patch onto the skin daily as needed (pain.).         Objective:   BP 99/67    Pulse 68    Ht _0  (1.753 m)    Wt 138 lb (62.6 kg)    SpO2 (!) 88%    BMI 20.38 kg/m   Wt Readings from Last 3 Encounters:  11/26/21 138 lb (62.6 kg)  11/10/21 135 lb (61.2 kg)  11/03/21 139 lb 12.8 oz (63.4 kg)    Physical Exam Vitals and nursing note reviewed.  Constitutional:      General: He is not in acute distress.    Appearance: He is well-developed. He is not diaphoretic.  Eyes:     General: No scleral icterus.    Conjunctiva/sclera: Conjunctivae normal.  Neck:     Thyroid: No thyromegaly.  Cardiovascular:     Rate and Rhythm: Normal rate and regular rhythm.     Heart sounds: Normal heart sounds. No murmur heard. Pulmonary:     Effort: Pulmonary effort is normal. No respiratory distress.     Breath sounds: Normal breath sounds. No wheezing.  Musculoskeletal:        General: No swelling. Normal range of motion.     Cervical back: Neck supple.  Lymphadenopathy:     Cervical: No cervical adenopathy.  Skin:    General: Skin is warm and dry.     Findings: No rash.  Neurological:     Mental Status: He is alert and oriented to person, place, and time.     Coordination: Coordination normal.  Psychiatric:        Behavior: Behavior normal.      Assessment & Plan:   Problem List Items Addressed This Visit       Cardiovascular and Mediastinum   Paroxysmal atrial fibrillation (HCC)   Relevant Medications    rosuvastatin (CRESTOR) 40 MG tablet   nitroGLYCERIN (NITROSTAT) 0.4 MG SL tablet   Chronic diastolic CHF (congestive heart failure) (HCC)   Relevant Medications   rosuvastatin (CRESTOR) 40 MG tablet   nitroGLYCERIN (NITROSTAT) 0.4 MG SL tablet   Hypertension   Relevant Medications   rosuvastatin (CRESTOR) 40 MG tablet   nitroGLYCERIN (NITROSTAT) 0.4 MG SL tablet     Endocrine   Type 2 diabetes mellitus (HCC) - Primary   Relevant Medications   rosuvastatin (CRESTOR) 40 MG tablet  Other   Dyslipidemia   Relevant Medications   rosuvastatin (CRESTOR) 40 MG tablet   Other Visit Diagnoses     Mixed hyperlipidemia       Relevant Medications   rosuvastatin (CRESTOR) 40 MG tablet   nitroGLYCERIN (NITROSTAT) 0.4 MG SL tablet       Will check A1c today, defer the rest of blood work to cardiology if they needed on Monday.  Continue current medicine.  Seems like breathing stable.  Recommended nasal saline to help prevent dryness in the nasal passages. Follow up plan: Return in about 6 months (around 05/27/2022), or if symptoms worsen or fail to improve, for Physical exam and cholesterol and diabetes.  Counseling provided for all of the vaccine components No orders of the defined types were placed in this encounter.   Caryl Pina, MD [facility] 11/26/2021, 8:48 AM

## 2021-11-30 NOTE — Progress Notes (Signed)
Cardiology Office Note   Date:  12/01/2021   ID:  Luis Salinas 1946/03/08, MRN [id number]  PCP:  Dettinger, Fransisca Kaufmann, MD  Cardiologist:  Dr. Gardiner Salinas  CC: Hospital Follow Up    History of Present Illness: Luis Salinas is a 75 y.o. male who presents for post cardioversion, with known history of CAD with STEMI on 11/2007 status post bare-metal stent to the left circumflex which was completed at [facility], chronic diastolic heart failure, paroxysmal atrial fibrillation on Eliquis, history of CVA, hypertension, dyslipidemia, pulmonary hypertension, with noncardiac history of type 2 diabetes, tobacco abuse, and oxygen dependent (4 L) COPD.  Was seen in the office on 11/03/2021 by Luis Rives, PA and set up for cardioversion as he remained in atrial fibrillation.  He had been compliant with Eliquis and not missed any doses and also continues to take aspirin 81 mg daily.  At that visit, his Bystolic was increased to 10 mg daily as his heart rates were in the 120s..  There was no signs of volume overload and he was to continue Lasix 40 mg daily along with potassium 20 mill equivalents daily and to take an extra dose should he gain 3 to 4 pounds in 24 hours or 1 week.  He was to continue his Jardiance 10 mg daily.  The patient was given risks benefits and explanation of procedure and he wished to proceed.  On 08/10/2021 the patient underwent cardioversion by Dr. Collier Salinas admission and converted from atrial fibrillation rate of 95 beats per minutes to normal sinus rhythm at 78 bpm without any neurologic sequela.  He followed up with his primary care physician Dr. Warrick Salinas on 11/26/2021 he was found to be mildly hypotensive with a blood pressure of 99/67 there is no evidence of volume overload.  There were no changes in his medication regimen.  Patient comes today with his wife, and is found to be hypoxic with an O2 sat of 84%.  He is on room air.  Did not bring his oxygen with him.  We did  place oxygen on him at 4 L during the office visit with sats increasing to 96% and 98% respectively.  He denies any significant pain.  He has chosen not to proceed with a sleep study.  He continues in atrial flutter with variable AV block and remains on anticoagulation.  Uncertain when he converted back from normal sinus rhythm.  He is asymptomatic and cannot tell that his heart rhythm is irregular.  Past Medical History:  Diagnosis Date   Amnesia 1967   Was in a coma and had no memory of the past. His family found him after 27 years   Arthritis    Cataract    Chronic diastolic CHF (congestive heart failure) (HCC)    COPD (chronic obstructive pulmonary disease) (HCC)    Dyslipidemia    History of concussion    History of STEMI    a. 11/2007 s/p DES to LCX at [facility]   Hypertension    Paroxysmal atrial fibrillation (HCC)    Pneumonia    exposure to caustic chemicals   Stroke [facility])    multiple strokes   Tobacco abuse    Type 2 diabetes mellitus ([place])     Past Surgical History:  Procedure Laterality Date   ANTERIOR CERVICAL DECOMP/DISCECTOMY FUSION  05/16/2012   Procedure: ANTERIOR CERVICAL DECOMPRESSION/DISCECTOMY FUSION 2 LEVELS;  Surgeon: Kristeen Miss, MD;  Location: [facility] NEURO ORS;  Service: Neurosurgery;  Laterality: N/A;  Cervical  Five-Six, Cervical Six-Seven Anterior Cervical Decompression/Diskectomy Fusion   CARDIAC CATHETERIZATION     CARDIOVERSION N/A 11/10/2021   Procedure: CARDIOVERSION;  Surgeon: Josue Hector, MD;  Location: [facility] ENDOSCOPY;  Service: Cardiovascular;  Laterality: N/A;   COLONOSCOPY W/ POLYPECTOMY     CORONARY ANGIOPLASTY     EYE SURGERY     cataracts   JOINT REPLACEMENT     Left knee replacement   JOINT REPLACEMENT     left elbow at age 22   [place]     left   TONSILLECTOMY       Current Outpatient Medications  Medication Sig Dispense Refill   Albuterol Sulfate 108 (90 Base) MCG/ACT AEPB Inhale 2 puffs into the lungs every 6 (six) hours  as needed (shortness of breath/ wheeze). 1 each 0   apixaban (ELIQUIS) 5 MG TABS tablet Take 1 tablet (5 mg total) by mouth 2 (two) times daily. 60 tablet 5   aspirin 81 MG EC tablet TAKE 1 TABLET (81 MG TOTAL) BY MOUTH DAILY. SWALLOW WHOLE. 30 tablet 5   blood glucose meter kit and supplies KIT Dispense based on patient and insurance preference. Use up to four times daily as directed. 1 each 0   Budeson-Glycopyrrol-Formoterol (BREZTRI AEROSPHERE) 160-9-4.8 MCG/ACT AERO Inhale 2 puffs into the lungs 2 (two) times daily. 10.7 g 11   diphenhydramine-acetaminophen (TYLENOL PM) 25-500 MG TABS tablet Take 1 tablet by mouth at bedtime as needed (sleep/pain.).     empagliflozin (JARDIANCE) 10 MG TABS tablet Take 1 tablet (10 mg total) by mouth daily. 30 tablet 5   furosemide (LASIX) 40 MG tablet Take 1 tablet (40 mg total) by mouth daily. 90 tablet 3   Menthol-Methyl Salicylate (SALONPAS PAIN RELIEF PATCH) PTCH Place 1 patch onto the skin daily as needed (pain.).     Multiple Vitamins-Minerals (ICAPS AREDS FORMULA PO) Take 1 capsule by mouth 2 (two) times daily.     nebivolol (BYSTOLIC) 10 MG tablet Take 1 tablet (10 mg total) by mouth daily. 90 tablet 3   nitroGLYCERIN (NITROSTAT) 0.4 MG SL tablet Place 1 tablet (0.4 mg total) under the tongue every 5 (five) minutes as needed for chest pain. 50 tablet 3   OXYGEN Inhale 4 L into the lungs daily.     potassium chloride SA (KLOR-CON) 20 MEQ tablet Take 1 tablet (20 mEq total) by mouth daily. 90 tablet 3   rosuvastatin (CRESTOR) 40 MG tablet Take 1 tablet (40 mg total) by mouth daily. 90 tablet 3   No current facility-administered medications for this visit.    Allergies:   Patient has no known allergies.    Social History:  The patient  reports that he has been smoking cigars. He has never used smokeless tobacco. He reports that he does not drink alcohol and does not use drugs.   Family History:  The patient's family history includes Aneurysm in his  father; COPD in his father; Heart disease in his mother.    ROS: All other systems are reviewed and negative. Unless otherwise mentioned in H&P    PHYSICAL EXAM: VS:  BP 102/62    Pulse 84    Ht 5' 9"  (1.753 m)    Wt 139 lb 9.6 oz (63.3 kg)    SpO2 (!) 83%    BMI 20.62 kg/m  , BMI Body mass index is 20.62 kg/m. GEN: Well nourished, well developed, in no acute distress HEENT: normal Neck: no JVD, carotid bruits, or masses Cardiac: IRRR;  no murmurs, rubs, or gallops,no edema  Respiratory:  Clear to auscultation bilaterally, normal work of breathing GI: soft, nontender, nondistended, + BS MS: no deformity or atrophy Skin: warm and dry, no rash, pale Neuro:  Strength and sensation are intact Psych: euthymic mood, full affect   EKG:  EKG is ordered today. The ekg ordered today demonstrates atrial flutter with variable AV block heart rate of 84 bpm.  (Personally reviewed)   Recent Labs: 09/19/2021: B Natriuretic Peptide 439.0 09-27-21: TSH 6.972 09/23/2021: Magnesium 2.4 10/01/2021: ALT 67 11/03/2021: BUN 15; Creatinine, Ser 0.83; Hemoglobin 14.4; Platelets 188; Potassium 4.8; Sodium 144    Lipid Panel    Component Value Date/Time   CHOL 104 05/23/2021 0816   CHOL 114 05/01/2013 1302   TRIG 98 05/23/2021 0816   TRIG 88 03/29/2015 1106   TRIG 141 05/01/2013 1302   HDL 39 (L) 05/23/2021 0816   HDL 38 (L) 03/29/2015 1106   HDL 34 (L) 05/01/2013 1302   CHOLHDL 2.7 05/23/2021 0816   CHOLHDL 4.0 03/17/2013 0415   VLDL 12 03/17/2013 0415   LDLCALC 46 05/23/2021 0816   LDLCALC 52 05/01/2013 1302      Wt Readings from Last 3 Encounters:  12/01/21 139 lb 9.6 oz (63.3 kg)  11/26/21 138 lb (62.6 kg)  11/10/21 135 lb (61.2 kg)      Other studies Reviewed: Echocardiogram Sep 27, 2021: Impressions:  1. Left ventricular ejection fraction, by estimation, is 60 to 65%. The  left ventricle has normal function. The left ventricle has no regional  wall motion abnormalities. There  is mild left ventricular hypertrophy.  Left ventricular diastolic parameters  are consistent with Grade II diastolic dysfunction (pseudonormalization).  There is the interventricular septum is flattened in systole and diastole,  consistent with right ventricular pressure and volume overload.   2. Right ventricular systolic function is moderately reduced. The right  ventricular size is normal. There is moderately elevated pulmonary artery  systolic pressure.   3. Left atrial size was severely dilated.   4. Right atrial size was moderately dilated.   5. The mitral valve is normal in structure. No evidence of mitral valve  regurgitation. No evidence of mitral stenosis.   6. The aortic valve is tricuspid. There is moderate calcification of the  aortic valve. There is moderate thickening of the aortic valve. Aortic  valve regurgitation is not visualized. Mild to moderate aortic valve  stenosis.   7. The inferior vena cava is dilated in size with <50% respiratory  variability, suggesting right atrial pressure of 15 mmHg.  ASSESSMENT AND PLAN:  1.  Atrial fibrillation: Status post a DCCV on 11/10/2021, and did convert to normal sinus rhythm.  He unfortunately has returned to atrial flutter with variable block.  We will continue him on rate control with nebivolol 10 mg daily, and apixaban 5 mg twice daily.  He wishes to hold off on having another cardioversion at this time.  Will see him again in 1 month to evaluate his status.  May need to consider repeat DCCV at that time.  He is completely asymptomatic.  2.  Oxygen dependent COPD: Requires oxygen via nasal cannula.  We placed oxygen on him during this office visit as he did not wear his oxygen in today.  His status significantly improved on 4 L.  Dr. Warrick Salinas manages his oxygen I will defer to him concerning the need to refer to pulmonology or continue management.  3.  Chronic diastolic heart failure: Does not have  evidence of volume overload  today remains on diuretics with daily weights.  3.  Hyperlipidemia: Continues on rosuvastatin 40 mg daily.  The patient is followed by PCP for labs.  Goal of LDL less than 100.  4.  Type 2 diabetes: Followed by PCP.  Current medicines are reviewed at length with the patient today.  I have spent 25 min's  dedicated to the care of this patient on the date of this encounter to include pre-visit review of records, assessment, management and diagnostic testing,with shared decision making.  Labs/ tests ordered today include: None Phill Myron. West Pugh, ANP, AACC   12/01/2021 11:12 AM    [place] [address] Office [phone] Fax [phone]  Notice: This dictation was prepared with Dragon dictation along with smaller phrase technology. Any transcriptional errors that result from this process are unintentional and may not be corrected upon review.

## 2021-12-01 ENCOUNTER — Encounter: Payer: Self-pay | Admitting: Adult Health

## 2021-12-01 ENCOUNTER — Other Ambulatory Visit: Payer: Self-pay

## 2021-12-01 ENCOUNTER — Ambulatory Visit (INDEPENDENT_AMBULATORY_CARE_PROVIDER_SITE_OTHER): Payer: Medicare Other | Admitting: Adult Health

## 2021-12-01 VITALS — BP 102/62 | HR 84 | Ht 69.0 in | Wt 139.6 lb

## 2021-12-01 DIAGNOSIS — I5032 Chronic diastolic (congestive) heart failure: Secondary | ICD-10-CM

## 2021-12-01 DIAGNOSIS — I4819 Other persistent atrial fibrillation: Secondary | ICD-10-CM | POA: Diagnosis not present

## 2021-12-01 DIAGNOSIS — E1169 Type 2 diabetes mellitus with other specified complication: Secondary | ICD-10-CM

## 2021-12-01 DIAGNOSIS — E785 Hyperlipidemia, unspecified: Secondary | ICD-10-CM

## 2021-12-01 LAB — CBC
Hematocrit: 46.4 % (ref 37.5–51.0)
Hemoglobin: 14.6 g/dL (ref 13.0–17.7)
MCH: 26.6 pg (ref 26.6–33.0)
MCHC: 31.5 g/dL (ref 31.5–35.7)
MCV: 85 fL (ref 79–97)
Platelets: 209 10*3/uL (ref 150–450)
RBC: 5.49 x10E6/uL (ref 4.14–5.80)
RDW: 14.6 % (ref 11.6–15.4)
WBC: 10.2 10*3/uL (ref 3.4–10.8)

## 2021-12-01 LAB — BASIC METABOLIC PANEL
BUN/Creatinine Ratio: 19 (ref 10–24)
BUN: 18 mg/dL (ref 8–27)
CO2: 28 mmol/L (ref 20–29)
Calcium: 10.1 mg/dL (ref 8.6–10.2)
Chloride: 97 mmol/L (ref 96–106)
Creatinine, Ser: 0.97 mg/dL (ref 0.76–1.27)
Glucose: 89 mg/dL (ref 70–99)
Potassium: 4.8 mmol/L (ref 3.5–5.2)
Sodium: 138 mmol/L (ref 134–144)
eGFR: 81 mL/min/{1.73_m2} (ref >59)

## 2021-12-01 NOTE — Patient Instructions (Signed)
Medication Instructions:  No Changes *If you need a refill on your cardiac medications before your next appointment, please call your pharmacy*   Lab Work: BMET, CBC : Today If you have labs (blood work) drawn today and your tests are completely normal, you will receive your results only by: MyChart Message (if you have MyChart) OR A paper copy in the mail If you have any lab test that is abnormal or we need to change your treatment, we will call you to review the results.   Testing/Procedures: No Testing   Follow-Up: At [facility], you and your health needs are our priority.  As part of our continuing mission to provide you with exceptional heart care, we have created designated Provider Care Teams.  These Care Teams include your primary Cardiologist (physician) and Advanced Practice Providers (APPs -  Physician Assistants and Nurse Practitioners) who all work together to provide you with the care you need, when you need it.  We recommend signing up for the patient portal called "MyChart".  Sign up information is provided on this After Visit Summary.  MyChart is used to connect with patients for Virtual Visits (Telemedicine).  Patients are able to view lab/test results, encounter notes, upcoming appointments, etc.  Non-urgent messages can be sent to your provider as well.   To learn more about what you can do with MyChart, go to [email or web address].    Your next appointment:   1 month(s)  The format for your next appointment:   In Person  Provider:   Little Ishikawa, MD

## 2021-12-03 ENCOUNTER — Telehealth: Payer: Self-pay

## 2021-12-03 ENCOUNTER — Other Ambulatory Visit: Payer: Self-pay | Admitting: Family Medicine

## 2021-12-03 NOTE — Telephone Encounter (Addendum)
Called patient regarding results. Left voice message----- Message from Jodelle Gross, NP sent at 12/02/2021 11:40 AM EST ----- I have reviewed his labs. No changes in his regimen. Follow up Lipids and LFTs in 3 months.  Rebeca Alert

## 2021-12-05 DIAGNOSIS — J449 Chronic obstructive pulmonary disease, unspecified: Secondary | ICD-10-CM | POA: Diagnosis not present

## 2021-12-09 ENCOUNTER — Telehealth: Payer: Self-pay

## 2021-12-09 ENCOUNTER — Telehealth: Payer: Self-pay | Admitting: Cardiovascular Disease

## 2021-12-09 NOTE — Telephone Encounter (Signed)
New Message:     Wife called, patient wants to cx his Sleep Study for 12-19-21. Please cx that appointment.

## 2021-12-09 NOTE — Telephone Encounter (Addendum)
Left message for patient to call office . Letter mailed.----- Message from Jodelle Gross, NP sent at 12/02/2021 11:40 AM EST ----- I have reviewed his labs. No changes in his regimen. Follow up Lipids and LFTs in 3 months.  Rebeca Alert

## 2021-12-11 NOTE — Telephone Encounter (Signed)
Sleep lab has been contacted to cancel.

## 2021-12-17 ENCOUNTER — Encounter (HOSPITAL_BASED_OUTPATIENT_CLINIC_OR_DEPARTMENT_OTHER): Payer: Medicare Other | Admitting: Cardiovascular Disease

## 2021-12-19 ENCOUNTER — Encounter: Payer: Medicare Other | Admitting: Cardiovascular Disease

## 2021-12-21 NOTE — Progress Notes (Signed)
Cardiology Office Note:    Date:  12/25/2021   ID:  Luis, Salinas 03/30/1946, MRN [id number]  PCP:  Dettinger, Fransisca Kaufmann, MD  Cardiologist:  Donato Heinz, MD  Electrophysiologist:  None   Referring MD: Dettinger, Fransisca Kaufmann, MD   Chief Complaint  Patient presents with   Follow-up    1 month.   Atrial Fibrillation    History of Present Illness:    Luis Salinas is a 76 y.o. male with a hx of chronic diastolic heart failure, W2NF, CVA, CAD s/p STEMI 2008 with BMS to LCX at [facility], COPD, hypertension, tobacco use who presents for follow-up.  He was admitted in October 2022 with acute on chronic diastolic heart failure.  Improved with IV Lasix.  He was found during admission to be in atrial fibrillation, which was a new diagnosis.  He was started on Eliquis and Bystolic for rate control.  He was discharged on p.o. Lasix 40 mg daily.  Underwent cardioversion on 11/04/2021.  Since last clinic visit, he reports that he is doing well.  Denies any chest pain, dyspnea, lightheadedness, syncope, lower extremity edema, or palpitations.  Weight down 9lbs since last clinic visit.  No bleeding issues on eliquis.   Wt Readings from Last 3 Encounters:  12/25/21 130 lb (59 kg)  12/01/21 139 lb 9.6 oz (63.3 kg)  11/26/21 138 lb (62.6 kg)       Past Medical History:  Diagnosis Date   Amnesia 1967   Was in a coma and had no memory of the past. His family found him after 27 years   Arthritis    Cataract    Chronic diastolic CHF (congestive heart failure) (HCC)    COPD (chronic obstructive pulmonary disease) ([place])    Dyslipidemia    History of concussion    History of STEMI    a. 11/2007 s/p DES to LCX at [facility]   Hypertension    Paroxysmal atrial fibrillation (HCC)    Pneumonia    exposure to caustic chemicals   Stroke [facility])    multiple strokes   Tobacco abuse    Type 2 diabetes mellitus ([place])     Past Surgical History:  Procedure Laterality Date   ANTERIOR CERVICAL  DECOMP/DISCECTOMY FUSION  05/16/2012   Procedure: ANTERIOR CERVICAL DECOMPRESSION/DISCECTOMY FUSION 2 LEVELS;  Surgeon: Kristeen Miss, MD;  Location: [facility] NEURO ORS;  Service: Neurosurgery;  Laterality: N/A;  Cervical Five-Six, Cervical Six-Seven Anterior Cervical Decompression/Diskectomy Fusion   CARDIAC CATHETERIZATION     CARDIOVERSION N/A 11/10/2021   Procedure: CARDIOVERSION;  Surgeon: Josue Hector, MD;  Location: [facility] ENDOSCOPY;  Service: Cardiovascular;  Laterality: N/A;   COLONOSCOPY W/ POLYPECTOMY     CORONARY ANGIOPLASTY     EYE SURGERY     cataracts   JOINT REPLACEMENT     Left knee replacement   JOINT REPLACEMENT     left elbow at age 40   [place]     left   TONSILLECTOMY      Current Medications: Current Meds  Medication Sig   Albuterol Sulfate 108 (90 Base) MCG/ACT AEPB Inhale 2 puffs into the lungs every 6 (six) hours as needed (shortness of breath/ wheeze).   apixaban (ELIQUIS) 5 MG TABS tablet Take 1 tablet (5 mg total) by mouth 2 (two) times daily.   aspirin 81 MG EC tablet TAKE 1 TABLET (81 MG TOTAL) BY MOUTH DAILY. SWALLOW WHOLE.   blood glucose meter kit and supplies KIT Dispense  based on patient and insurance preference. Use up to four times daily as directed.   Budeson-Glycopyrrol-Formoterol (BREZTRI AEROSPHERE) 160-9-4.8 MCG/ACT AERO Inhale 2 puffs into the lungs 2 (two) times daily.   diphenhydramine-acetaminophen (TYLENOL PM) 25-500 MG TABS tablet Take 1 tablet by mouth at bedtime as needed (sleep/pain.).   empagliflozin (JARDIANCE) 10 MG TABS tablet Take 1 tablet (10 mg total) by mouth daily.   furosemide (LASIX) 40 MG tablet Take 1 tablet (40 mg total) by mouth daily.   Menthol-Methyl Salicylate (SALONPAS PAIN RELIEF PATCH) PTCH Place 1 patch onto the skin daily as needed (pain.).   Multiple Vitamins-Minerals (ICAPS AREDS FORMULA PO) Take 1 capsule by mouth 2 (two) times daily.   nebivolol (BYSTOLIC) 10 MG tablet Take 1.5 tablets (15 mg total) by  mouth daily.   nitroGLYCERIN (NITROSTAT) 0.4 MG SL tablet Place 1 tablet (0.4 mg total) under the tongue every 5 (five) minutes as needed for chest pain.   OXYGEN Inhale 4 L into the lungs daily.   potassium chloride SA (KLOR-CON) 20 MEQ tablet Take 1 tablet (20 mEq total) by mouth daily.   rosuvastatin (CRESTOR) 40 MG tablet Take 1 tablet (40 mg total) by mouth daily.   [DISCONTINUED] nebivolol (BYSTOLIC) 10 MG tablet Take 1 tablet (10 mg total) by mouth daily.     Allergies:   Patient has no known allergies.   Social History   Socioeconomic History   Marital status: Married    Spouse name: Shirlean Mylar   Number of children: 7   Years of education: college   Highest education level: Some college, no degree  Occupational History   Occupation: Retired    Comment: Museum/gallery curator  Tobacco Use   Smoking status: Light Smoker    Types: Cigars    Last attempt to quit: 04/03/2013    Years since quitting: 8.7   Smokeless tobacco: Never   Tobacco comments:    One cigar per day.  Smoked 40 years cigarettes.  10 years ago quit cigarettes.  Vaping Use   Vaping Use: Never used  Substance and Sexual Activity   Alcohol use: No    Alcohol/week: 0.0 standard drinks   Drug use: No    Types: Marijuana   Sexual activity: Yes  Other Topics Concern   Not on file  Social History Narrative   Lives with his fourth wife.    Social Determinants of Health   Financial Resource Strain: Low Risk    Difficulty of Paying Living Expenses: Not hard at all  Food Insecurity: No Food Insecurity   Worried About Charity fundraiser in the Last Year: Never true   [place] in the Last Year: Never true  Transportation Needs: No Transportation Needs   Lack of Transportation (Medical): No   Lack of Transportation (Non-Medical): No  Physical Activity: Insufficiently Active   Days of Exercise per Week: 7 days   Minutes of Exercise per Session: 10 min  Stress: No Stress Concern Present   Feeling of Stress : Not at  all  Social Connections: Socially Integrated   Frequency of Communication with Friends and Family: More than three times a week   Frequency of Social Gatherings with Friends and Family: More than three times a week   Attends Religious Services: 1 to 4 times per year   Active Member of [employer] or Organizations: Yes   Attends Archivist Meetings: 1 to 4 times per year   Marital Status: Married  Family History: The patient's family history includes Aneurysm in his father; COPD in his father; Heart disease in his mother. There is no history of Anesthesia problems, Hypotension, Malignant hyperthermia, or Pseudochol deficiency.  ROS:   Please see the history of present illness.     All other systems reviewed and are negative.  EKGs/Labs/Other Studies Reviewed:    The following studies were reviewed today:   EKG:   12/25/21: narrow complex tachycardia, suspect atrial flutter, rate 128, Q waves in III, aVF  Recent Labs: 09/19/2021: B Natriuretic Peptide 439.0 09/20/2021: TSH 6.972 09/23/2021: Magnesium 2.4 10/01/2021: ALT 67 12/01/2021: BUN 18; Creatinine, Ser 0.97; Hemoglobin 14.6; Platelets 209; Potassium 4.8; Sodium 138  Recent Lipid Panel    Component Value Date/Time   CHOL 104 05/23/2021 0816   CHOL 114 05/01/2013 1302   TRIG 98 05/23/2021 0816   TRIG 88 03/29/2015 1106   TRIG 141 05/01/2013 1302   HDL 39 (L) 05/23/2021 0816   HDL 38 (L) 03/29/2015 1106   HDL 34 (L) 05/01/2013 1302   CHOLHDL 2.7 05/23/2021 0816   CHOLHDL 4.0 03/17/2013 0415   VLDL 12 03/17/2013 0415   LDLCALC 46 05/23/2021 0816   LDLCALC 52 05/01/2013 1302    Physical Exam:    VS:  BP 112/70 (BP Location: Right Arm, Patient Position: Sitting, Cuff Size: Normal)    Pulse 96    Ht 5' 9"  (1.753 m)    Wt 130 lb (59 kg)    BMI 19.20 kg/m     Wt Readings from Last 3 Encounters:  12/25/21 130 lb (59 kg)  12/01/21 139 lb 9.6 oz (63.3 kg)  11/26/21 138 lb (62.6 kg)     GEN:  Well nourished,  well developed in no acute distress HEENT: Normal NECK: No JVD; No carotid bruits CARDIAC: tachycardic, regular, no murmurs, rubs, gallops RESPIRATORY:  Clear to auscultation without rales, wheezing or rhonchi  ABDOMEN: Soft, non-tender, non-distended MUSCULOSKELETAL:  No edema; No deformity  SKIN: Warm and dry NEUROLOGIC:  Alert and oriented x 3 PSYCHIATRIC:  Normal affect   ASSESSMENT:    1. Persistent atrial fibrillation ([place])   2. Medication management   3. Coronary artery disease involving native coronary artery of native heart without angina pectoris   4. Chronic diastolic CHF (congestive heart failure) ([place])   5. Aortic valve stenosis, etiology of cardiac valve disease unspecified   6. Primary hypertension   7. Dyslipidemia    PLAN:    Persistent Atrial Fibrillation: Status post DCCV 11/10/2021.  Now appears to be in atrial flutter with rate 120s - Continue Bystolic, will increase to 15 mg daily - Continue Eliquis 61m twice daily.  - Recommend scheduling in A. fib clinic to consider antiarrhythmic.  Suspect unlikely to maintain sinus rhythm given his severe atrial dilatation on echocardiogram.  He appears asymptomatic in atrial flutter, but rates remain elevated.  Suspect he will need an antiarrhythmic and repeat cardioversion  CAD: History of STEMI in 11/2007 s/p DES to LCX. No angina.  - Continue aspirin, beta-blocker, statin   Chronic Diastolic CHF: Admitted in October 2022 with acute on chronic diastolic heart failure.  Echo during admission showed LVEF of 60-65% with normal wall motion, grade 2 diastolic dysfunction, and flattened interventricular septum in systole and diastole consistent with RV pressure and volume overload. RV was normal in size with moderately reduced systolic function and moderately elevated PASP.   Euvolemic on exam.  - Continue Lasix 484mdaily. On KCl 20 mEq daily  with this.   - Continue Jardiance 4m daily.  - Check BMET/magnesium  Aortic  Stenosis: Mild to moderate AS on echo 09/2021, will monitor - Will continue routine surveillance.    Hypertension: On Bystolic 10 mg daily   Dyslipidemia: LDL 46 05/2021.  Continue Crestor 40 mg  Type 2 Diabetes Mellitus: On Jardiance   COPD: on Home O2   Tobacco Abuse: continues to smoke cigars which he has done for over 60 years.  Cessation recommended   RTC in 6 weeks   Medication Adjustments/Labs and Tests Ordered: Current medicines are reviewed at length with the patient today.  Concerns regarding medicines are outlined above.  Orders Placed This Encounter  Procedures   Comprehensive Metabolic Panel (CMET)   Magnesium   CBC with Differential/Platelet   Amb Referral to AFIB Clinic   EKG 12-Lead   Meds ordered this encounter  Medications   nebivolol (BYSTOLIC) 10 MG tablet    Sig: Take 1.5 tablets (15 mg total) by mouth daily.    Dispense:  135 tablet    Refill:  3    Patient Instructions  Medication Instructions:  Your physician has recommended you make the following change in your medication:  INCREASE: Bystolic (nCHTVGVSYV)48mg once daily *If you need a refill on your cardiac medications before your next appointment, please call your pharmacy*   Lab Work: Your physician recommends that you return for lab work in:  TODAY: CMET, Mag, CBC If you have labs (blood work) drawn today and your tests are completely normal, you will receive your results only by: MWa[place]u(if you have MDennison OR A paper copy in the mail If you have any lab test that is abnormal or we need to change your treatment, we will call you to review the results.   Testing/Procedures: None   Follow-Up: [facility] and [facility] are our [facility].  As part of our continuing mission to provide you with exceptional heart care, we have created designated Provider Care Teams.  These Care Teams include your primary Cardiologist (physician) and Advanced Practice Providers  (APPs -  Physician Assistants and Nurse Practitioners) who all work together to provide you with the care you need, when you need it.  We recommend signing up for the patient portal called "MyChart".  Sign up information is provided on this After Visit Summary.  MyChart is used to connect with patients for Virtual Visits (Telemedicine).  Patients are able to view lab/test results, encounter notes, upcoming appointments, etc.  Non-urgent messages can be sent to your provider as well.   To learn more about what you can do with MyChart, go to [email or web address]    Your next appointment:   6 week(s)  The format for your next appointment:   In Person  Provider:   CDonato Heinz MD     Other Instructions     Signed, CDonato Heinz MD  12/25/2021 5:40 PM    CSan Isidro

## 2021-12-25 ENCOUNTER — Other Ambulatory Visit: Payer: Self-pay

## 2021-12-25 ENCOUNTER — Ambulatory Visit: Payer: Medicare Other | Admitting: Cardiology

## 2021-12-25 ENCOUNTER — Encounter: Payer: Self-pay | Admitting: Cardiology

## 2021-12-25 VITALS — BP 112/70 | HR 96 | Ht 69.0 in | Wt 130.0 lb

## 2021-12-25 DIAGNOSIS — I350 Nonrheumatic aortic (valve) stenosis: Secondary | ICD-10-CM

## 2021-12-25 DIAGNOSIS — E785 Hyperlipidemia, unspecified: Secondary | ICD-10-CM

## 2021-12-25 DIAGNOSIS — Z79899 Other long term (current) drug therapy: Secondary | ICD-10-CM

## 2021-12-25 DIAGNOSIS — I2510 Atherosclerotic heart disease of native coronary artery without angina pectoris: Secondary | ICD-10-CM

## 2021-12-25 DIAGNOSIS — I5032 Chronic diastolic (congestive) heart failure: Secondary | ICD-10-CM

## 2021-12-25 DIAGNOSIS — I4819 Other persistent atrial fibrillation: Secondary | ICD-10-CM | POA: Diagnosis not present

## 2021-12-25 DIAGNOSIS — I10 Essential (primary) hypertension: Secondary | ICD-10-CM | POA: Diagnosis not present

## 2021-12-25 MED ORDER — NEBIVOLOL HCL 10 MG PO TABS: 15.0000 mg | ORAL_TABLET | Freq: Every day | ORAL | 3 refills | Status: DC

## 2021-12-25 NOTE — Patient Instructions (Addendum)
Medication Instructions:  Your physician has recommended you make the following change in your medication:  INCREASE: Bystolic (nebivolol)15 mg once daily *If you need a refill on your cardiac medications before your next appointment, please call your pharmacy*   Lab Work: Your physician recommends that you return for lab work in:  TODAY: CMET, Mag, CBC If you have labs (blood work) drawn today and your tests are completely normal, you will receive your results only by: MyChart Message (if you have MyChart) OR A paper copy in the mail If you have any lab test that is abnormal or we need to change your treatment, we will call you to review the results.   Testing/Procedures: None   Follow-Up: At [facility], you and your health needs are our priority.  As part of our continuing mission to provide you with exceptional heart care, we have created designated Provider Care Teams.  These Care Teams include your primary Cardiologist (physician) and Advanced Practice Providers (APPs -  Physician Assistants and Nurse Practitioners) who all work together to provide you with the care you need, when you need it.  We recommend signing up for the patient portal called "MyChart".  Sign up information is provided on this After Visit Summary.  MyChart is used to connect with patients for Virtual Visits (Telemedicine).  Patients are able to view lab/test results, encounter notes, upcoming appointments, etc.  Non-urgent messages can be sent to your provider as well.   To learn more about what you can do with MyChart, go to [email or web address].    Your next appointment:   6 week(s)  The format for your next appointment:   In Person  Provider:   Little Ishikawa, MD     Other Instructions

## 2021-12-26 LAB — COMPREHENSIVE METABOLIC PANEL
ALT: 18 IU/L (ref 0–44)
AST: 19 IU/L (ref 0–40)
Albumin/Globulin Ratio: 1.7 (ref 1.2–2.2)
Albumin: 4.8 g/dL — ABNORMAL HIGH (ref 3.7–4.7)
Alkaline Phosphatase: 137 IU/L — ABNORMAL HIGH (ref 44–121)
BUN/Creatinine Ratio: 17 (ref 10–24)
BUN: 21 mg/dL (ref 8–27)
Bilirubin Total: 0.4 mg/dL (ref 0.0–1.2)
CO2: 24 mmol/L (ref 20–29)
Calcium: 10.3 mg/dL — ABNORMAL HIGH (ref 8.6–10.2)
Chloride: 99 mmol/L (ref 96–106)
Creatinine, Ser: 1.25 mg/dL (ref 0.76–1.27)
Globulin, Total: 2.9 g/dL (ref 1.5–4.5)
Glucose: 98 mg/dL (ref 70–99)
Potassium: 5.1 mmol/L (ref 3.5–5.2)
Sodium: 142 mmol/L (ref 134–144)
Total Protein: 7.7 g/dL (ref 6.0–8.5)
eGFR: 60 mL/min/{1.73_m2} (ref >59)

## 2021-12-26 LAB — CBC WITH DIFFERENTIAL/PLATELET
Basophils Absolute: 0.1 10*3/uL (ref 0.0–0.2)
Basos: 1 %
EOS (ABSOLUTE): 0.4 10*3/uL (ref 0.0–0.4)
Eos: 3 %
Hematocrit: 50.8 % (ref 37.5–51.0)
Hemoglobin: 16.4 g/dL (ref 13.0–17.7)
Immature Grans (Abs): 0 10*3/uL (ref 0.0–0.1)
Immature Granulocytes: 0 %
Lymphocytes Absolute: 3.1 10*3/uL (ref 0.7–3.1)
Lymphs: 27 %
MCH: 27.1 pg (ref 26.6–33.0)
MCHC: 32.3 g/dL (ref 31.5–35.7)
MCV: 84 fL (ref 79–97)
Monocytes Absolute: 0.9 10*3/uL (ref 0.1–0.9)
Monocytes: 8 %
Neutrophils Absolute: 6.9 10*3/uL (ref 1.4–7.0)
Neutrophils: 61 %
Platelets: 221 10*3/uL (ref 150–450)
RBC: 6.06 x10E6/uL — ABNORMAL HIGH (ref 4.14–5.80)
RDW: 15.2 % (ref 11.6–15.4)
WBC: 11.4 10*3/uL — ABNORMAL HIGH (ref 3.4–10.8)

## 2021-12-26 LAB — MAGNESIUM: Magnesium: 2.4 mg/dL — ABNORMAL HIGH (ref 1.6–2.3)

## 2022-01-05 DIAGNOSIS — J449 Chronic obstructive pulmonary disease, unspecified: Secondary | ICD-10-CM | POA: Diagnosis not present

## 2022-01-08 ENCOUNTER — Ambulatory Visit (HOSPITAL_COMMUNITY): Payer: Medicare Other | Admitting: Physician Assistant

## 2022-01-20 ENCOUNTER — Encounter (HOSPITAL_COMMUNITY): Payer: Self-pay | Admitting: Physician Assistant

## 2022-01-20 ENCOUNTER — Ambulatory Visit (HOSPITAL_COMMUNITY)
Admission: RE | Admit: 2022-01-20 | Discharge: 2022-01-20 | Disposition: A | Discharge status: Home / Self Care | Payer: Medicare Other | Source: Ambulatory Visit | Attending: Physician Assistant | Admitting: Physician Assistant

## 2022-01-20 ENCOUNTER — Other Ambulatory Visit: Payer: Self-pay

## 2022-01-20 VITALS — BP 98/68 | HR 100 | Ht 69.0 in | Wt 139.6 lb

## 2022-01-20 DIAGNOSIS — I252 Old myocardial infarction: Secondary | ICD-10-CM | POA: Diagnosis not present

## 2022-01-20 DIAGNOSIS — I110 Hypertensive heart disease with heart failure: Secondary | ICD-10-CM | POA: Diagnosis not present

## 2022-01-20 DIAGNOSIS — D6869 Other thrombophilia: Secondary | ICD-10-CM | POA: Insufficient documentation

## 2022-01-20 DIAGNOSIS — I639 Cerebral infarction, unspecified: Secondary | ICD-10-CM | POA: Insufficient documentation

## 2022-01-20 DIAGNOSIS — I484 Atypical atrial flutter: Secondary | ICD-10-CM | POA: Diagnosis not present

## 2022-01-20 DIAGNOSIS — Z87891 Personal history of nicotine dependence: Secondary | ICD-10-CM | POA: Diagnosis not present

## 2022-01-20 DIAGNOSIS — I38 Endocarditis, valve unspecified: Secondary | ICD-10-CM | POA: Diagnosis not present

## 2022-01-20 DIAGNOSIS — Z7901 Long term (current) use of anticoagulants: Secondary | ICD-10-CM | POA: Diagnosis not present

## 2022-01-20 DIAGNOSIS — I4819 Other persistent atrial fibrillation: Secondary | ICD-10-CM | POA: Diagnosis not present

## 2022-01-20 DIAGNOSIS — I2510 Atherosclerotic heart disease of native coronary artery without angina pectoris: Secondary | ICD-10-CM | POA: Insufficient documentation

## 2022-01-20 DIAGNOSIS — J449 Chronic obstructive pulmonary disease, unspecified: Secondary | ICD-10-CM | POA: Insufficient documentation

## 2022-01-20 DIAGNOSIS — I119 Hypertensive heart disease without heart failure: Secondary | ICD-10-CM | POA: Diagnosis not present

## 2022-01-20 DIAGNOSIS — I5032 Chronic diastolic (congestive) heart failure: Secondary | ICD-10-CM | POA: Insufficient documentation

## 2022-01-20 NOTE — Progress Notes (Signed)
Primary Care Physician: Dettinger, Fransisca Kaufmann, MD Primary Cardiologist: Dr Gardiner Rhyme Primary Electrophysiologist: none Referring Physician: Dr Jefm Petty is a 76 y.o. male with a history of chronic diastolic heart failure, DM, HTN, CVA, CAD s/p STEMI 2008 with BMS to LCX at [facility], COPD, hypertension, tobacco use, atrial fibrillation who presents for consultation in the [facility].  The patient was initially diagnosed with atrial fibrillation 09/2021 after presenting with acute on chronic diastolic CHF. Patient was started on Eliquis for a CHADS2VASC score of 8. He underwent DCCV on 11/04/21. Unfortunately, he had quick return of atrial flutter. He was seen on 12/25/21 and his nebivolol was increased. Today, patient reports that he is asymptomatic with his arrhythmia. He denies any issues with fluid retention. No bleeding issues on anticoagulation.   Today, he denies symptoms of palpitations, chest pain, orthopnea, PND, lower extremity edema, dizziness, presyncope, syncope, snoring, daytime somnolence, bleeding, or neurologic sequela. The patient is tolerating medications without difficulties and is otherwise without complaint today.    Atrial Fibrillation Risk Factors:  he does have symptoms or diagnosis of sleep apnea. Patient declined sleep study. he does not have a history of rheumatic fever.   he has a BMI of Body mass index is 20.62 kg/m.Marland Kitchen Filed Weights   01/20/22 1000  Weight: 63.3 kg    Family History  Problem Relation Age of Onset   Heart disease Mother    COPD Father    Aneurysm Father    Anesthesia problems Neg Hx    Hypotension Neg Hx    Malignant hyperthermia Neg Hx    Pseudochol deficiency Neg Hx      Atrial Fibrillation Management history:  Previous antiarrhythmic drugs: none Previous cardioversions: 11/10/21 Previous ablations: none CHADS2VASC score: 8 Anticoagulation history: Eliquis   Past Medical History:   Diagnosis Date   Amnesia 1967   Was in a coma and had no memory of the past. His family found him after 27 years   Arthritis    Cataract    Chronic diastolic CHF (congestive heart failure) (HCC)    COPD (chronic obstructive pulmonary disease) ([place])    Dyslipidemia    History of concussion    History of STEMI    a. 11/2007 s/p DES to LCX at [facility]   Hypertension    Paroxysmal atrial fibrillation (HCC)    Pneumonia    exposure to caustic chemicals   Stroke [facility])    multiple strokes   Tobacco abuse    Type 2 diabetes mellitus ([place])    Past Surgical History:  Procedure Laterality Date   ANTERIOR CERVICAL DECOMP/DISCECTOMY FUSION  05/16/2012   Procedure: ANTERIOR CERVICAL DECOMPRESSION/DISCECTOMY FUSION 2 LEVELS;  Surgeon: Kristeen Miss, MD;  Location: [facility] NEURO ORS;  Service: Neurosurgery;  Laterality: N/A;  Cervical Five-Six, Cervical Six-Seven Anterior Cervical Decompression/Diskectomy Fusion   CARDIAC CATHETERIZATION     CARDIOVERSION N/A 11/10/2021   Procedure: CARDIOVERSION;  Surgeon: Josue Hector, MD;  Location: [facility] ENDOSCOPY;  Service: Cardiovascular;  Laterality: N/A;   COLONOSCOPY W/ POLYPECTOMY     CORONARY ANGIOPLASTY     EYE SURGERY     cataracts   JOINT REPLACEMENT     Left knee replacement   JOINT REPLACEMENT     left elbow at age 59   [place]     left   TONSILLECTOMY      Current Outpatient Medications  Medication Sig Dispense Refill   ACCU-CHEK GUIDE test  strip USE TO CHECK SUGAR UP TO 4 TIMES DAILY AS DIRECTED     Accu-Chek Softclix Lancets lancets SMARTSIG:Topical 1-4 Times Daily     Albuterol Sulfate 108 (90 Base) MCG/ACT AEPB Inhale 2 puffs into the lungs every 6 (six) hours as needed (shortness of breath/ wheeze). 1 each 0   apixaban (ELIQUIS) 5 MG TABS tablet Take 1 tablet (5 mg total) by mouth 2 (two) times daily. 60 tablet 5   aspirin 81 MG EC tablet TAKE 1 TABLET (81 MG TOTAL) BY MOUTH DAILY. SWALLOW WHOLE. 30 tablet 5   blood glucose  meter kit and supplies KIT Dispense based on patient and insurance preference. Use up to four times daily as directed. 1 each 0   Budeson-Glycopyrrol-Formoterol (BREZTRI AEROSPHERE) 160-9-4.8 MCG/ACT AERO Inhale 2 puffs into the lungs 2 (two) times daily. 10.7 g 11   diphenhydramine-acetaminophen (TYLENOL PM) 25-500 MG TABS tablet Take 1 tablet by mouth at bedtime as needed (sleep/pain.).     empagliflozin (JARDIANCE) 10 MG TABS tablet Take 1 tablet (10 mg total) by mouth daily. 30 tablet 5   furosemide (LASIX) 40 MG tablet Take 1 tablet (40 mg total) by mouth daily. 90 tablet 3   Menthol-Methyl Salicylate (SALONPAS PAIN RELIEF PATCH) PTCH Place 1 patch onto the skin daily as needed (pain.).     Multiple Vitamins-Minerals (ICAPS AREDS FORMULA PO) Take 1 capsule by mouth 2 (two) times daily.     nebivolol (BYSTOLIC) 10 MG tablet Take 1.5 tablets (15 mg total) by mouth daily. 135 tablet 3   nitroGLYCERIN (NITROSTAT) 0.4 MG SL tablet Place 1 tablet (0.4 mg total) under the tongue every 5 (five) minutes as needed for chest pain. 50 tablet 3   OXYGEN Inhale 4 L into the lungs daily.     potassium chloride SA (KLOR-CON) 20 MEQ tablet Take 1 tablet (20 mEq total) by mouth daily. 90 tablet 3   rosuvastatin (CRESTOR) 40 MG tablet Take 1 tablet (40 mg total) by mouth daily. 90 tablet 3   No current facility-administered medications for this encounter.    No Known Allergies  Social History   Socioeconomic History   Marital status: Married    Spouse name: Shirlean Mylar   Number of children: 7   Years of education: college   Highest education level: Some college, no degree  Occupational History   Occupation: Retired    Comment: Museum/gallery curator  Tobacco Use   Smoking status: Light Smoker    Types: Cigars    Last attempt to quit: 04/03/2013    Years since quitting: 8.8   Smokeless tobacco: Never   Tobacco comments:    One cigar per day.  Smoked 40 years cigarettes.  10 years ago quit cigarettes.  Vaping Use    Vaping Use: Never used  Substance and Sexual Activity   Alcohol use: No    Alcohol/week: 0.0 standard drinks   Drug use: Yes    Types: Marijuana    Comment: offer he will smoke 01/20/22   Sexual activity: Yes  Other Topics Concern   Not on file  Social History Narrative   Lives with his fourth wife.    Social Determinants of Health   Financial Resource Strain: Low Risk    Difficulty of Paying Living Expenses: Not hard at all  Food Insecurity: No Food Insecurity   Worried About Charity fundraiser in the Last Year: Never true   Ran Out of Food in the Last Year: Never true  Transportation  Needs: No Transportation Needs   Lack of Transportation (Medical): No   Lack of Transportation (Non-Medical): No  Physical Activity: Insufficiently Active   Days of Exercise per Week: 7 days   Minutes of Exercise per Session: 10 min  Stress: No Stress Concern Present   Feeling of Stress : Not at all  Social Connections: Socially Integrated   Frequency of Communication with Friends and Family: More than three times a week   Frequency of Social Gatherings with Friends and Family: More than three times a week   Attends Religious Services: 1 to 4 times per year   Active Member of [employer] or Organizations: Yes   Attends Archivist Meetings: 1 to 4 times per year   Marital Status: Married  Human resources officer Violence: Not At Risk   Fear of Current or Ex-Partner: No   Emotionally Abused: No   Physically Abused: No   Sexually Abused: No     ROS- All systems are reviewed and negative except as per the HPI above.  Physical Exam: Vitals:   01/20/22 1000  BP: 98/68  Pulse: 100  Weight: 63.3 kg  Height: _0  (1.753 m)    GEN- The patient is a well appearing elderly male, alert and oriented x 3 today.   Head- normocephalic, atraumatic Eyes-  Sclera clear, conjunctiva pink Ears- hearing intact Oropharynx- clear Neck- supple  Lungs- diminished breath sounds bilaterally, normal work  of breathing Heart- irregular rate and rhythm, no murmurs, rubs or gallops  GI- soft, NT, ND, + BS Extremities- no clubbing, cyanosis, or edema MS- no significant deformity or atrophy Skin- no rash or lesion Psych- euthymic mood, full affect Neuro- strength and sensation are intact  Wt Readings from Last 3 Encounters:  01/20/22 63.3 kg  12/25/21 59 kg  12/01/21 63.3 kg    EKG today demonstrates  Atypical atrial flutter with variable block Vent. rate 100 BPM PR interval * ms QRS duration 80 ms QT/QTcB 324/417 ms  Echo 09/20/21 demonstrated  1. Left ventricular ejection fraction, by estimation, is 60 to 65%. The  left ventricle has normal function. The left ventricle has no regional  wall motion abnormalities. There is mild left ventricular hypertrophy.  Left ventricular diastolic parameters are consistent with Grade II diastolic dysfunction (pseudonormalization). There is the interventricular septum is flattened in systole and diastole,  consistent with right ventricular pressure and volume overload.   2. Right ventricular systolic function is moderately reduced. The right  ventricular size is normal. There is moderately elevated pulmonary artery systolic pressure.   3. Left atrial size was severely dilated.   4. Right atrial size was moderately dilated.   5. The mitral valve is normal in structure. No evidence of mitral valve  regurgitation. No evidence of mitral stenosis.   6. The aortic valve is tricuspid. There is moderate calcification of the  aortic valve. There is moderate thickening of the aortic valve. Aortic  valve regurgitation is not visualized. Mild to moderate aortic valve  stenosis.   7. The inferior vena cava is dilated in size with <50% respiratory  variability, suggesting right atrial pressure of 15 mmHg.   Epic records are reviewed at length today  CHA2DS2-VASc Score = 8  The patient's score is based upon: CHF History: 1 HTN History: 1 Diabetes History:  1 Stroke History: 2 Vascular Disease History: 1 Age Score: 2 Gender Score: 0       ASSESSMENT AND PLAN: 1. Persistent Atrial Fibrillation/atypical atrial flutter The patient's  CHA2DS2-VASc score is 8, indicating a 10.8% annual risk of stroke.   We discussed rate vs rhythm control today. Specifically, we discussed dofetilide and amiodarone. He would likely not be a good ablation candidate given O2 dependent COPD. Patient is unsure if he would like to pursue rhythm control given how well he feels today. We did discuss that his arrhythmias could contribute to worsening CHF symptoms. He would like to take time to consider his options. Information on dofetilide admission provided. His heart rate is better controlled on the higher dose of nebivolol. Continue nebivolol 15 mg daily Continue Eliquis 5 mg BID  2. Secondary Hypercoagulable State (ICD10:  D68.69) The patient is at significant risk for stroke/thromboembolism based upon his CHA2DS2-VASc Score of 8.  Continue Apixaban (Eliquis).   3. CAD No anginal symptoms.  4. Chronic diastolic CHF No signs or symptoms of fluid overload today.  5. Valvular heart disease Mild to moderate AS  6. HTN Borderline low, no dizziness or presyncope.  No changes today.   Follow up in the AF clinic in one month to revisit rhythm control.    [facility] [address] [place], [place] [address] [phone] 01/20/2022 10:06 AM

## 2022-02-01 IMAGING — DX DG CHEST 1V PORT
1 series · 1 of 1 positions shown · non-contrast
Comparison: 03/16/2013

CLINICAL DATA: Shortness of breath

EXAM:
PORTABLE CHEST 1 VIEW

[chest ap]
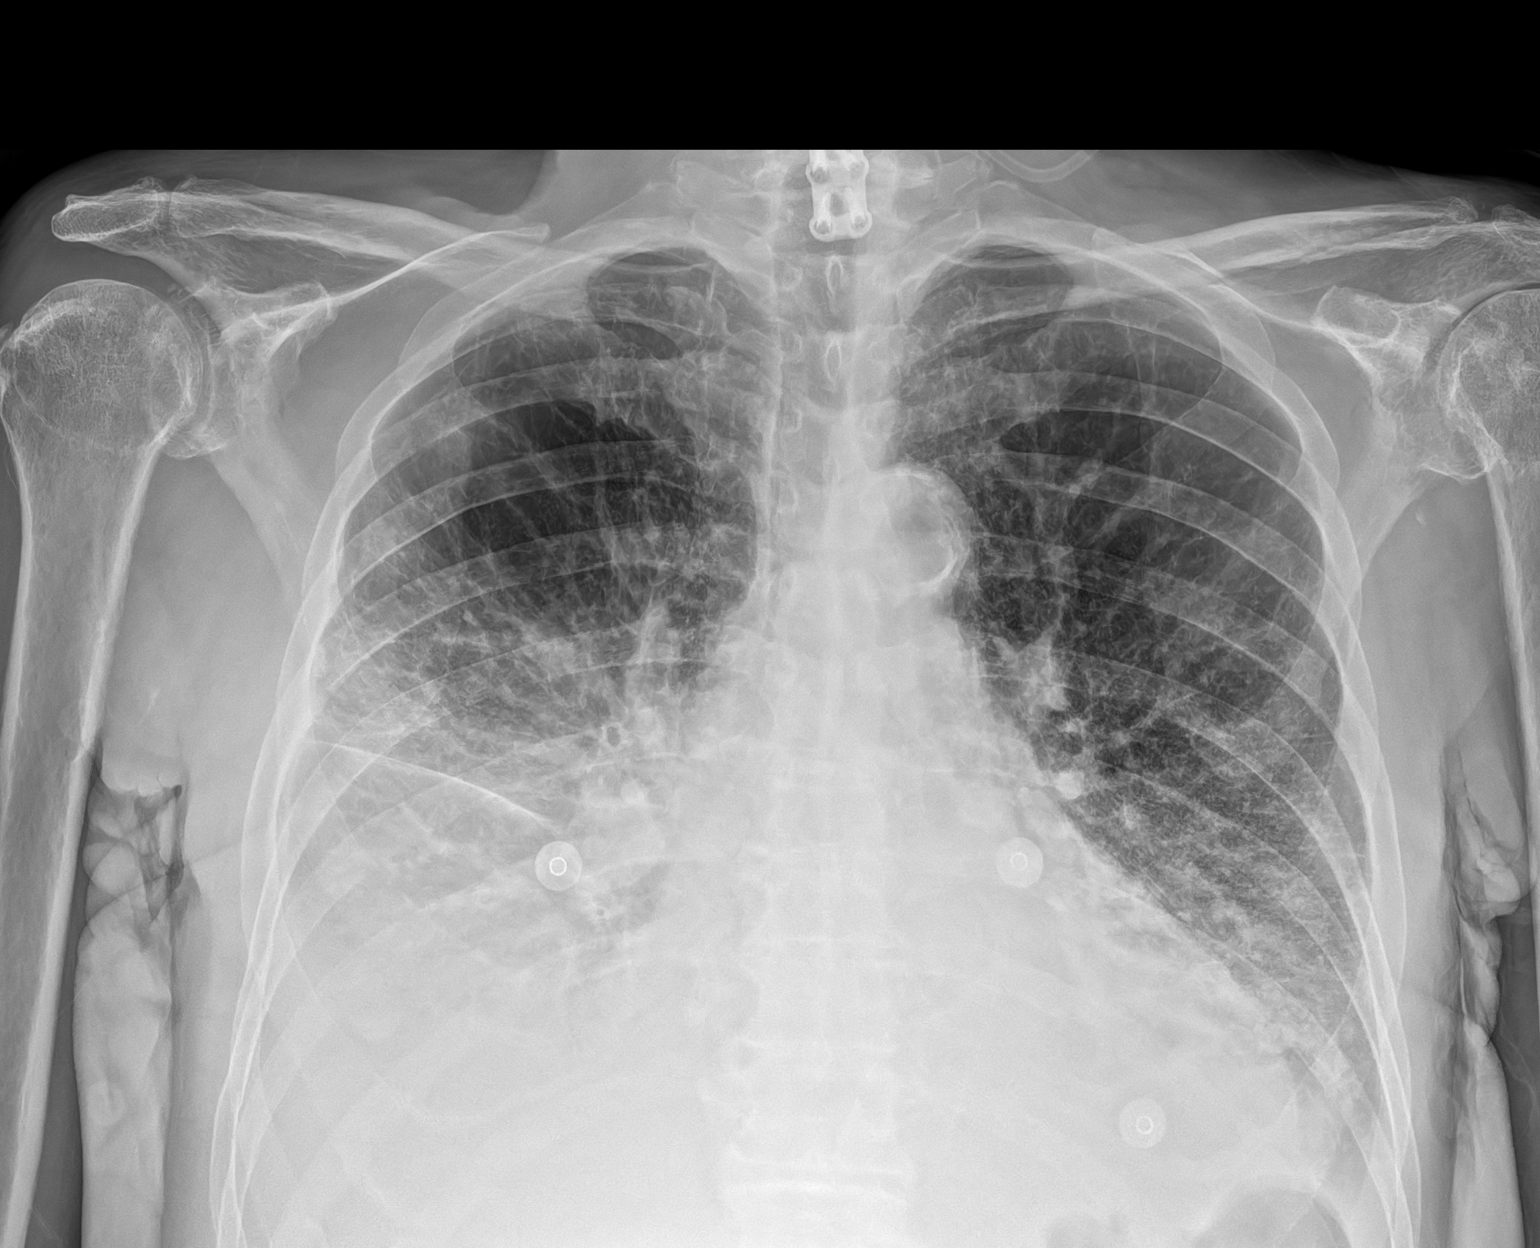

[1 of 1 positions shown; findings below may reference images not displayed]

FINDINGS: Hardware in the cervical spine. Cardiomegaly with vascular
congestion and interstitial pulmonary edema. There is underlying
emphysema. Small left and small moderate right pleural effusion.
Basilar airspace disease. Aortic atherosclerosis.
IMPRESSION: 1. Cardiomegaly with vascular congestion, interstitial edema and
right greater than left pleural effusion
2. Underlying emphysema. Airspace disease at the bases may reflect
atelectasis or pneumonia

## 2022-02-05 DIAGNOSIS — J449 Chronic obstructive pulmonary disease, unspecified: Secondary | ICD-10-CM | POA: Diagnosis not present

## 2022-02-17 ENCOUNTER — Ambulatory Visit (HOSPITAL_COMMUNITY)
Admission: RE | Admit: 2022-02-17 | Discharge: 2022-02-17 | Disposition: A | Discharge status: Home / Self Care | Payer: Medicare Other | Source: Ambulatory Visit | Attending: Physician Assistant | Admitting: Physician Assistant

## 2022-02-17 ENCOUNTER — Other Ambulatory Visit: Payer: Self-pay

## 2022-02-17 ENCOUNTER — Encounter (HOSPITAL_COMMUNITY): Payer: Self-pay | Admitting: Physician Assistant

## 2022-02-17 VITALS — BP 110/78 | HR 88 | Ht 69.0 in | Wt 140.6 lb

## 2022-02-17 DIAGNOSIS — F17290 Nicotine dependence, other tobacco product, uncomplicated: Secondary | ICD-10-CM | POA: Insufficient documentation

## 2022-02-17 DIAGNOSIS — I5032 Chronic diastolic (congestive) heart failure: Secondary | ICD-10-CM | POA: Diagnosis not present

## 2022-02-17 DIAGNOSIS — I358 Other nonrheumatic aortic valve disorders: Secondary | ICD-10-CM | POA: Insufficient documentation

## 2022-02-17 DIAGNOSIS — Z7901 Long term (current) use of anticoagulants: Secondary | ICD-10-CM | POA: Diagnosis not present

## 2022-02-17 DIAGNOSIS — I252 Old myocardial infarction: Secondary | ICD-10-CM | POA: Insufficient documentation

## 2022-02-17 DIAGNOSIS — Z9981 Dependence on supplemental oxygen: Secondary | ICD-10-CM | POA: Insufficient documentation

## 2022-02-17 DIAGNOSIS — I110 Hypertensive heart disease with heart failure: Secondary | ICD-10-CM | POA: Insufficient documentation

## 2022-02-17 DIAGNOSIS — J449 Chronic obstructive pulmonary disease, unspecified: Secondary | ICD-10-CM | POA: Insufficient documentation

## 2022-02-17 DIAGNOSIS — I4819 Other persistent atrial fibrillation: Secondary | ICD-10-CM | POA: Insufficient documentation

## 2022-02-17 DIAGNOSIS — I350 Nonrheumatic aortic (valve) stenosis: Secondary | ICD-10-CM | POA: Insufficient documentation

## 2022-02-17 DIAGNOSIS — F17210 Nicotine dependence, cigarettes, uncomplicated: Secondary | ICD-10-CM | POA: Insufficient documentation

## 2022-02-17 DIAGNOSIS — D6869 Other thrombophilia: Secondary | ICD-10-CM | POA: Insufficient documentation

## 2022-02-17 DIAGNOSIS — E119 Type 2 diabetes mellitus without complications: Secondary | ICD-10-CM | POA: Insufficient documentation

## 2022-02-17 DIAGNOSIS — I484 Atypical atrial flutter: Secondary | ICD-10-CM | POA: Diagnosis not present

## 2022-02-17 DIAGNOSIS — I2510 Atherosclerotic heart disease of native coronary artery without angina pectoris: Secondary | ICD-10-CM | POA: Insufficient documentation

## 2022-02-17 DIAGNOSIS — Z8673 Personal history of transient ischemic attack (TIA), and cerebral infarction without residual deficits: Secondary | ICD-10-CM | POA: Insufficient documentation

## 2022-02-17 DIAGNOSIS — Z955 Presence of coronary angioplasty implant and graft: Secondary | ICD-10-CM | POA: Diagnosis not present

## 2022-02-17 NOTE — Progress Notes (Signed)
Primary Care Physician: Dettinger, Fransisca Kaufmann, MD Primary Cardiologist: Dr Gardiner Rhyme Primary Electrophysiologist: none Referring Physician: Dr Jefm Petty is a 76 y.o. male with a history of chronic diastolic heart failure, DM, HTN, CVA, CAD s/p STEMI 2008 with BMS to LCX at [facility], COPD, hypertension, tobacco use, atrial fibrillation who presents for follow up in the [facility].  The patient was initially diagnosed with atrial fibrillation 09/2021 after presenting with acute on chronic diastolic CHF. Patient was started on Eliquis for a CHADS2VASC score of 8. He underwent DCCV on 11/04/21. Unfortunately, he had quick return of atrial flutter. He was seen on 12/25/21 and his nebivolol was increased.   On follow up today, patient reports that he feels "great" today. He does not have any awareness of his arrhythmia. He weighs himself at home and his weight has remained stable. No lower extremity edema. No bleeding issues on anticoagulation.   Today, he denies symptoms of palpitations, chest pain, orthopnea, PND, lower extremity edema, dizziness, presyncope, syncope, snoring, daytime somnolence, bleeding, or neurologic sequela. The patient is tolerating medications without difficulties and is otherwise without complaint today.    Atrial Fibrillation Risk Factors:  he does have symptoms or diagnosis of sleep apnea. Patient declined sleep study. he does not have a history of rheumatic fever.   he has a BMI of Body mass index is 20.76 kg/m.Marland Kitchen Filed Weights   02/17/22 0954  Weight: 63.8 kg     Family History  Problem Relation Age of Onset   Heart disease Mother    COPD Father    Aneurysm Father    Anesthesia problems Neg Hx    Hypotension Neg Hx    Malignant hyperthermia Neg Hx    Pseudochol deficiency Neg Hx      Atrial Fibrillation Management history:  Previous antiarrhythmic drugs: none Previous cardioversions: 11/10/21 Previous  ablations: none CHADS2VASC score: 8 Anticoagulation history: Eliquis   Past Medical History:  Diagnosis Date   Amnesia 1967   Was in a coma and had no memory of the past. His family found him after 27 years   Arthritis    Cataract    Chronic diastolic CHF (congestive heart failure) (HCC)    COPD (chronic obstructive pulmonary disease) ([place])    Dyslipidemia    History of concussion    History of STEMI    a. 11/2007 s/p DES to LCX at [facility]   Hypertension    Paroxysmal atrial fibrillation (HCC)    Pneumonia    exposure to caustic chemicals   Stroke [facility])    multiple strokes   Tobacco abuse    Type 2 diabetes mellitus ([place])    Past Surgical History:  Procedure Laterality Date   ANTERIOR CERVICAL DECOMP/DISCECTOMY FUSION  05/16/2012   Procedure: ANTERIOR CERVICAL DECOMPRESSION/DISCECTOMY FUSION 2 LEVELS;  Surgeon: Kristeen Miss, MD;  Location: [facility] NEURO ORS;  Service: Neurosurgery;  Laterality: N/A;  Cervical Five-Six, Cervical Six-Seven Anterior Cervical Decompression/Diskectomy Fusion   CARDIAC CATHETERIZATION     CARDIOVERSION N/A 11/10/2021   Procedure: CARDIOVERSION;  Surgeon: Josue Hector, MD;  Location: [facility] ENDOSCOPY;  Service: Cardiovascular;  Laterality: N/A;   COLONOSCOPY W/ POLYPECTOMY     CORONARY ANGIOPLASTY     EYE SURGERY     cataracts   JOINT REPLACEMENT     Left knee replacement   JOINT REPLACEMENT     left elbow at age 54   [place]  left   TONSILLECTOMY      Current Outpatient Medications  Medication Sig Dispense Refill   ACCU-CHEK GUIDE test strip USE TO CHECK SUGAR UP TO 4 TIMES DAILY AS DIRECTED     Accu-Chek Softclix Lancets lancets SMARTSIG:Topical 1-4 Times Daily     Albuterol Sulfate 108 (90 Base) MCG/ACT AEPB Inhale 2 puffs into the lungs every 6 (six) hours as needed (shortness of breath/ wheeze). 1 each 0   apixaban (ELIQUIS) 5 MG TABS tablet Take 1 tablet (5 mg total) by mouth 2 (two) times daily. 60 tablet 5   aspirin 81 MG EC  tablet TAKE 1 TABLET (81 MG TOTAL) BY MOUTH DAILY. SWALLOW WHOLE. 30 tablet 5   blood glucose meter kit and supplies KIT Dispense based on patient and insurance preference. Use up to four times daily as directed. 1 each 0   Budeson-Glycopyrrol-Formoterol (BREZTRI AEROSPHERE) 160-9-4.8 MCG/ACT AERO Inhale 2 puffs into the lungs 2 (two) times daily. 10.7 g 11   diphenhydramine-acetaminophen (TYLENOL PM) 25-500 MG TABS tablet Take 1 tablet by mouth at bedtime as needed (sleep/pain.).     empagliflozin (JARDIANCE) 10 MG TABS tablet Take 1 tablet (10 mg total) by mouth daily. 30 tablet 5   furosemide (LASIX) 40 MG tablet Take 1 tablet (40 mg total) by mouth daily. 90 tablet 3   Menthol-Methyl Salicylate (SALONPAS PAIN RELIEF PATCH) PTCH Place 1 patch onto the skin daily as needed (pain.).     Multiple Vitamins-Minerals (ICAPS AREDS FORMULA PO) Take 1 capsule by mouth 2 (two) times daily.     nebivolol (BYSTOLIC) 10 MG tablet Take 1.5 tablets (15 mg total) by mouth daily. 135 tablet 3   nitroGLYCERIN (NITROSTAT) 0.4 MG SL tablet Place 1 tablet (0.4 mg total) under the tongue every 5 (five) minutes as needed for chest pain. 50 tablet 3   OXYGEN Inhale 4 L into the lungs daily.     potassium chloride SA (KLOR-CON) 20 MEQ tablet Take 1 tablet (20 mEq total) by mouth daily. 90 tablet 3   rosuvastatin (CRESTOR) 40 MG tablet Take 1 tablet (40 mg total) by mouth daily. 90 tablet 3   No current facility-administered medications for this encounter.    No Known Allergies  Social History   Socioeconomic History   Marital status: Married    Spouse name: Shirlean Mylar   Number of children: 7   Years of education: college   Highest education level: Some college, no degree  Occupational History   Occupation: Retired    Comment: Museum/gallery curator  Tobacco Use   Smoking status: Light Smoker    Types: Cigars    Last attempt to quit: 04/03/2013    Years since quitting: 8.8   Smokeless tobacco: Never   Tobacco comments:     1-2 cigars per day.  Smoked 40 years cigarettes.  10 years ago quit cigarettes.  Vaping Use   Vaping Use: Never used  Substance and Sexual Activity   Alcohol use: No    Alcohol/week: 0.0 standard drinks   Drug use: Yes    Types: Marijuana    Comment: offer he will smoke 01/20/22   Sexual activity: Yes  Other Topics Concern   Not on file  Social History Narrative   Lives with his fourth wife.    Social Determinants of Health   Financial Resource Strain: Low Risk    Difficulty of Paying Living Expenses: Not hard at all  Food Insecurity: No Food Insecurity   Worried About Running Out  of Food in the Last Year: Never true  ° Ran Out of Food in the Last Year: Never true  °Transportation Needs: No Transportation Needs  ° Lack of Transportation (Medical): No  ° Lack of Transportation (Non-Medical): No  °Physical Activity: Insufficiently Active  ° Days of Exercise per Week: 7 days  ° Minutes of Exercise per Session: 10 min  °Stress: No Stress Concern Present  ° Feeling of Stress : Not at all  °Social Connections: Socially Integrated  ° Frequency of Communication with Friends and Family: More than three times a week  ° Frequency of Social Gatherings with Friends and Family: More than three times a week  ° Attends Religious Services: 1 to 4 times per year  ° Active Member of Clubs or Organizations: Yes  ° Attends Club or Organization Meetings: 1 to 4 times per year  ° Marital Status: Married  °Intimate Partner Violence: Not At Risk  ° Fear of Current or Ex-Partner: No  ° Emotionally Abused: No  ° Physically Abused: No  ° Sexually Abused: No  ° ° ° °ROS- All systems are reviewed and negative except as per the HPI above. ° °Physical Exam: °Vitals:  ° 02/17/22 0954  °BP: 110/78  °Pulse: 88  °Weight: 63.8 kg  °Height: 5' 9" (1.753 m)  ° ° °GEN- The patient is a well appearing elderly male, alert and oriented x 3 today.   °HEENT-head normocephalic, atraumatic, sclera clear, conjunctiva pink, hearing intact,  trachea midline. °Lungs- Diminished breath sounds bilaterally, normal work of breathing °Heart- irregular rate and rhythm, no murmurs, rubs or gallops  °GI- soft, NT, ND, + BS °Extremities- no clubbing, cyanosis, or edema °MS- no significant deformity or atrophy °Skin- no rash or lesion °Psych- euthymic mood, full affect °Neuro- strength and sensation are intact ° ° °Wt Readings from Last 3 Encounters:  °02/17/22 63.8 kg  °01/20/22 63.3 kg  °12/25/21 59 kg  ° ° °EKG today demonstrates  °Atypical atrial flutter with variable block °Vent. rate 88 BPM °PR interval * ms °QRS duration 80 ms °QT/QTcB 372/450 ms ° °Echo 09/20/21 demonstrated  °1. Left ventricular ejection fraction, by estimation, is 60 to 65%. The  °left ventricle has normal function. The left ventricle has no regional  °wall motion abnormalities. There is mild left ventricular hypertrophy.  °Left ventricular diastolic parameters are consistent with Grade II diastolic dysfunction (pseudonormalization). There is the interventricular septum is flattened in systole and diastole,  °consistent with right ventricular pressure and volume overload.  ° 2. Right ventricular systolic function is moderately reduced. The right  °ventricular size is normal. There is moderately elevated pulmonary artery systolic pressure.  ° 3. Left atrial size was severely dilated.  ° 4. Right atrial size was moderately dilated.  ° 5. The mitral valve is normal in structure. No evidence of mitral valve  °regurgitation. No evidence of mitral stenosis.  ° 6. The aortic valve is tricuspid. There is moderate calcification of the  °aortic valve. There is moderate thickening of the aortic valve. Aortic  °valve regurgitation is not visualized. Mild to moderate aortic valve  °stenosis.  ° 7. The inferior vena cava is dilated in size with <50% respiratory  °variability, suggesting right atrial pressure of 15 mmHg.  ° °Epic records are reviewed at length today ° °CHA2DS2-VASc Score = 8  °The  patient's score is based upon: °CHF History: 1 °HTN History: 1 °Diabetes History: 1 °Stroke History: 2 °Vascular Disease History: 1 °Age Score: 2 °Gender Score: 0 °    ° ° °  ASSESSMENT AND PLAN: 1. Persistent Atrial Fibrillation/atypical atrial flutter The patient's CHA2DS2-VASc score is 8, indicating a 10.8% annual risk of stroke.   We discussed rate vs rhythm control again today. He would likely not be a good ablation candidate given O2 dependent COPD. We discussed dofetilide loading again and patient declines at this time. He would like to pursue a rate control approach. We did discuss the longer he is out of rhythm the more difficult it will be to restore SR down the road if needed. He voices understanding.  Continue nebivolol 15 mg daily Continue Eliquis 5 mg BID  2. Secondary Hypercoagulable State (ICD10:  D68.69) The patient is at significant risk for stroke/thromboembolism based upon his CHA2DS2-VASc Score of 8.  Continue Apixaban (Eliquis).   3. CAD No anginal symptoms.  4. Chronic diastolic CHF Appears euvolemic today.  5. Valvular heart disease Mild to moderate AS  6. HTN Stable, no changes today.    Follow up with Dr Gardiner Rhyme in 2-3 months. AF clinic as needed.    [facility] [address] [place], [place] [address] [phone] 02/17/2022 10:03 AM

## 2022-03-05 DIAGNOSIS — J449 Chronic obstructive pulmonary disease, unspecified: Secondary | ICD-10-CM | POA: Diagnosis not present

## 2022-03-10 ENCOUNTER — Other Ambulatory Visit: Payer: Self-pay | Admitting: Family Medicine

## 2022-03-12 ENCOUNTER — Ambulatory Visit: Payer: Medicare Other | Admitting: Physician Assistant

## 2022-03-15 ENCOUNTER — Other Ambulatory Visit: Payer: Self-pay | Admitting: Family Medicine

## 2022-03-15 NOTE — Progress Notes (Signed)
? ?Cardiology Clinic Note  ? ?Patient Name: Luis Salinas ?Date of Encounter: 03/18/2022 ? ?Primary Care Provider:  Dettinger, Fransisca Kaufmann, MD ?Primary Cardiologist:  Donato Heinz, MD ? ?Patient Profile  ?  ?Luis Salinas 76 year old male presents to the clinic today for follow-up evaluation of his coronary artery disease, paroxysmal atrial fibrillation, and hypertension. ? ?Past Medical History  ?  ?Past Medical History:  ?Diagnosis Date  ? [place]  ? Was in a coma and had no memory of the past. His family found him after 27 years  ? Arthritis   ? Cataract   ? Chronic diastolic CHF (congestive heart failure) ([place])   ? COPD (chronic obstructive pulmonary disease) ([place])   ? Dyslipidemia   ? History of concussion   ? History of STEMI   ? a. 11/2007 s/p DES to LCX at [facility]  ? Hypertension   ? Paroxysmal atrial fibrillation (HCC)   ? Pneumonia   ? exposure to caustic chemicals  ? Stroke [facility])   ? multiple strokes  ? Tobacco abuse   ? Type 2 diabetes mellitus ([place])   ? ?Past Surgical History:  ?Procedure Laterality Date  ? ANTERIOR CERVICAL DECOMP/DISCECTOMY FUSION  05/16/2012  ? Procedure: ANTERIOR CERVICAL DECOMPRESSION/DISCECTOMY FUSION 2 LEVELS;  Surgeon: Kristeen Miss, MD;  Location: [facility];  Service: Neurosurgery;  Laterality: N/A;  Cervical Five-Six, Cervical Six-Seven Anterior Cervical Decompression/Diskectomy Fusion  ? CARDIAC CATHETERIZATION    ? CARDIOVERSION N/A 11/10/2021  ? Procedure: CARDIOVERSION;  Surgeon: Josue Hector, MD;  Location: [facility] ENDOSCOPY;  Service: Cardiovascular;  Laterality: N/A;  ? COLONOSCOPY W/ POLYPECTOMY    ? CORONARY ANGIOPLASTY    ? EYE SURGERY    ? cataracts  ? JOINT REPLACEMENT    ? Left knee replacement  ? JOINT REPLACEMENT    ? left elbow at age 69  ? ROTATOR CUFF REPAIR    ? left  ? TONSILLECTOMY    ? ? ?Allergies ? ?No Known Allergies ? ?History of Present Illness  ?  ?Luis Salinas has a PMH of hypertension, diabetes, CVA, chronic diastolic CHF, coronary  artery disease status post STEMI 2008 with BMS to his circumflex (catheterization performed at [facility]).  His PMH also includes COPD, HTN, tobacco use, and atrial fibrillation.  CHA2DS2-VASc score 8. ? ?He was seen by Dr. Gardiner Rhyme on 12/25/2021.  During that time it was noted that he had been admitted 10/22 with acute on chronic diastolic CHF.  He improved with diuresis.  He was also found to have atrial fibrillation which was a new diagnosis.  He was started on Eliquis and Bystolic.  He underwent cardioversion 11/04/2021.  At follow-up he was noted to have a weight loss of 9 pounds, denied bleeding issues and reported compliance with apixaban.  He denied chest pain, shortness of breath, lightheadedness, syncope and lower extremity swelling. ? ?He was seen in follow-up by Malka So, PA on 02/17/2022.  He reported feeling great.  He was noncardiac aware.  He reported that his weight had been stable.  He was not noted to have any lower extremity swelling.  He denied bleeding issues and reported compliance with his apixaban.  He denied palpitations, chest pain, orthopnea, lower extremity swelling, syncope, snoring, neurologic changes.  He was tolerating his medications well and had no complaints at that time. ? ?He presents to the clinic today for follow-up evaluation states he feels well.  He is not very active due to his COPD.  He presents to the clinic today without oxygen.  However, he wears oxygen at home.  He denies bleeding issues and reports compliance with his apixaban medication.  He does note that he missed 1 dose yesterday evening.  He has a history of CVA which affected his short-term memory.  His wife helps him with his medication.  He also reports that he had amnesia for around 26 years.  He was tracked down by a Games developer and returned to [place] after that time.  I will continue his current medication therapy.  We will have him increase his physical activity as tolerated and plan  follow-up for 6 months. ? ?Today he denies chest pain, shortness of breath, lower extremity edema, fatigue, palpitations, melena, hematuria, hemoptysis, diaphoresis, weakness, presyncope, syncope, orthopnea, and PND. ? ? ?Home Medications  ?  ?Prior to Admission medications   ?Medication Sig Start Date End Date Taking? Authorizing Provider  ?ACCU-CHEK GUIDE test strip USE TO CHECK SUGAR UP TO 4 TIMES DAILY AS DIRECTED 09/24/21   [provider]  ?Accu-Chek Softclix Lancets lancets SMARTSIG:Topical 1-4 Times Daily 09/25/21   [provider]  ?Albuterol Sulfate 108 (90 Base) MCG/ACT AEPB Inhale 2 puffs into the lungs every 6 (six) hours as needed (shortness of breath/ wheeze). 04/09/18   Janora Norlander, DO  ?apixaban (ELIQUIS) 5 MG TABS tablet Take 1 tablet (5 mg total) by mouth 2 (two) times daily. 10/01/21   Dettinger, Fransisca [facility], MD  ?aspirin 81 MG EC tablet TAKE 1 TABLET (81 MG TOTAL) BY MOUTH DAILY. SWALLOW WHOLE. 10/23/21   Dettinger, Fransisca K[facility]mann, MD  ?blood glucose meter kit and supplies KIT Dispense based on patient and insurance preference. Use up to four times daily as directed. 09/24/21   Alma Friendly, MD  ?Budeson-Glycopyrrol-Formoterol (BREZTRI AEROSPHERE) 160-9-4.8 MCG/ACT AERO Inhale 2 puffs into the lungs 2 (two) times daily. 09/04/21   Dettinger, Fransisca K[facility]mann, MD  ?diphenhydramine-acetaminophen (TYLENOL PM) 25-500 MG TABS tablet Take 1 tablet by mouth at bedtime as needed (sleep/pain.).    [provider]  ?empagliflozin (JARDIANCE) 10 MG TABS tablet Take 1 tablet (10 mg total) by mouth daily. 10/01/21   Dettinger, Fransisca [facility], MD  ?furosemide (LASIX) 40 MG tablet Take 1 tablet (40 mg total) by mouth daily. 10/01/21   Dettinger, Fransisca [facility], MD  ?Menthol-Methyl Salicylate (SALONPAS PAIN RELIEF PATCH) [place] 1 patch onto the skin daily as needed (pain.).    [provider]  ?Multiple Vitamins-Minerals (ICAPS AREDS FORMULA PO) Take 1 capsule by mouth 2 (two) times  daily.    [provider]  ?nebivolol (BYSTOLIC) 10 MG tablet Take 1.5 tablets (15 mg total) by mouth daily. 12/25/21   Donato Heinz, MD  ?nitroGLYCERIN (NITROSTAT) 0.4 MG SL tablet Place 1 tablet (0.4 mg total) under the tongue every 5 (five) minutes as needed for chest pain. 11/26/21   Dettinger, Fransisca [facility], MD  ?OXYGEN Inhale 4 L into the lungs daily.    [provider]  ?potassium chloride SA (KLOR-CON) 20 MEQ tablet Take 1 tablet (20 mEq total) by mouth daily. 10/01/21   Dettinger, Fransisca [facility], MD  ?rosuvastatin (CRESTOR) 40 MG tablet Take 1 tablet (40 mg total) by mouth daily. 11/26/21   Dettinger, Fransisca [facility], MD  ? ? ?Family History  ?  ?Family History  ?Problem Relation Age of Onset  ? Heart disease Mother   ? COPD Father   ? Aneurysm Father   ? Anesthesia problems Neg Hx   ?  Hypotension Neg Hx   ? Malignant hyperthermia Neg Hx   ? Pseudochol deficiency Neg Hx   ? ?He indicated that his mother is deceased. He indicated that his father is deceased. He indicated that all of his four sisters are alive. He indicated that his brother is alive. He indicated that the status of his neg hx is unknown. ? ?Social History  ?  ?Social History  ? ?Socioeconomic History  ? Marital status: Married  ?  Spouse name: Shirlean Mylar  ? Number of children: 7  ? Years of education: college  ? Highest education level: Some college, no degree  ?Occupational History  ? 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 Smoking status: Light Smoker  ?  Types: Cigars  ?  Last attempt to quit: 04/03/2013  ?  Years since quitting: 8.9  ? Smokeless tobacco: Never  ? Tobacco comments:  ?  1-2 cigars per day.  Smoked 40 years cigarettes.  10 years ago quit cigarettes.  ?Vaping Use  ? Vaping Use: Never used  ?Substance and Sexual Activity  ? Alcohol use: No  ?  Alcohol/week: 0.0 standard drinks  ? Drug use: Yes  ?  Types: Marijuana  ?  Comment: offer he will smoke 01/20/22  ? Sexual activity: Yes  ?Other Topics Concern  ?  Not on file  ?Social History Narrative  ? Lives with his fourth wife.   ? ?Social Determinants of Health  ? ?Financial Resource Strain: Low Risk   ? Difficulty of Paying Living Expenses: Not hard at all  ?Food Insecu

## 2022-03-18 ENCOUNTER — Encounter: Payer: Self-pay | Admitting: General Practice

## 2022-03-18 ENCOUNTER — Ambulatory Visit: Payer: Medicare Other | Admitting: General Practice

## 2022-03-18 VITALS — BP 90/56 | HR 69 | Ht 69.0 in | Wt 139.8 lb

## 2022-03-18 DIAGNOSIS — I2510 Atherosclerotic heart disease of native coronary artery without angina pectoris: Secondary | ICD-10-CM

## 2022-03-18 DIAGNOSIS — E785 Hyperlipidemia, unspecified: Secondary | ICD-10-CM | POA: Diagnosis not present

## 2022-03-18 DIAGNOSIS — I10 Essential (primary) hypertension: Secondary | ICD-10-CM | POA: Diagnosis not present

## 2022-03-18 DIAGNOSIS — I4819 Other persistent atrial fibrillation: Secondary | ICD-10-CM | POA: Diagnosis not present

## 2022-03-18 DIAGNOSIS — I5032 Chronic diastolic (congestive) heart failure: Secondary | ICD-10-CM

## 2022-03-18 NOTE — Patient Instructions (Signed)
Medication Instructions:  ?The current medical regimen is effective;  continue present plan and medications as directed. Please refer to the Current Medication list given to you today. ? ?*If you need a refill on your cardiac medications before your next appointment, please call your pharmacy* ? ?Lab Work:   Testing/Procedures:  ?none    none ? ?Special Instructions ?PLEASE READ AND FOLLOW SALTY 6-ATTACHED-1,800mg  daily ? ?PLEASE INCREASE PHYSICAL ACTIVITY AS TOLERATED  ? ?Please try to avoid these triggers: ?Do not use any products that have nicotine or tobacco in them. These include cigarettes, e-cigarettes, and chewing tobacco. If you need help quitting, ask your doctor. ?Eat heart-healthy foods. Talk with your doctor about the right eating plan for you. ?Exercise regularly as told by your doctor. ?Stay hydrated ?Do not drink alcohol, Caffeine or chocolate. ?Lose weight if you are overweight. ?Do not use drugs, including cannabis ?  ?Follow-Up: ?Your next appointment:  6 month(s) In Person with Little Ishikawa, MD   ? ?At [facility], you and your health needs are our priority.  As part of our continuing mission to provide you with exceptional heart care, we have created designated Provider Care Teams.  These Care Teams include your primary Cardiologist (physician) and Advanced Practice Providers (APPs -  Physician Assistants and Nurse Practitioners) who all work together to provide you with the care you need, when you need it. ? ? ? ?    ?

## 2022-04-05 DIAGNOSIS — J449 Chronic obstructive pulmonary disease, unspecified: Secondary | ICD-10-CM | POA: Diagnosis not present

## 2022-04-16 ENCOUNTER — Other Ambulatory Visit: Payer: Self-pay | Admitting: Family Medicine

## 2022-04-16 DIAGNOSIS — I480 Paroxysmal atrial fibrillation: Secondary | ICD-10-CM

## 2022-04-16 DIAGNOSIS — I5043 Acute on chronic combined systolic (congestive) and diastolic (congestive) heart failure: Secondary | ICD-10-CM

## 2022-04-16 DIAGNOSIS — R7303 Prediabetes: Secondary | ICD-10-CM

## 2022-04-17 ENCOUNTER — Other Ambulatory Visit: Payer: Self-pay | Admitting: Family Medicine

## 2022-05-27 ENCOUNTER — Encounter: Payer: Self-pay | Admitting: Family Medicine

## 2022-05-27 ENCOUNTER — Telehealth: Payer: Self-pay | Admitting: Cardiology

## 2022-05-27 ENCOUNTER — Ambulatory Visit (INDEPENDENT_AMBULATORY_CARE_PROVIDER_SITE_OTHER): Payer: Medicare Other | Admitting: Family Medicine

## 2022-05-27 VITALS — BP 110/63 | HR 54 | Temp 97.0°F | Ht 69.0 in | Wt 134.0 lb

## 2022-05-27 DIAGNOSIS — E1169 Type 2 diabetes mellitus with other specified complication: Secondary | ICD-10-CM | POA: Diagnosis not present

## 2022-05-27 DIAGNOSIS — E782 Mixed hyperlipidemia: Secondary | ICD-10-CM | POA: Diagnosis not present

## 2022-05-27 DIAGNOSIS — I10 Essential (primary) hypertension: Secondary | ICD-10-CM | POA: Diagnosis not present

## 2022-05-27 DIAGNOSIS — I2510 Atherosclerotic heart disease of native coronary artery without angina pectoris: Secondary | ICD-10-CM

## 2022-05-27 DIAGNOSIS — I5032 Chronic diastolic (congestive) heart failure: Secondary | ICD-10-CM

## 2022-05-27 DIAGNOSIS — E785 Hyperlipidemia, unspecified: Secondary | ICD-10-CM

## 2022-05-27 DIAGNOSIS — J449 Chronic obstructive pulmonary disease, unspecified: Secondary | ICD-10-CM

## 2022-05-27 DIAGNOSIS — Z125 Encounter for screening for malignant neoplasm of prostate: Secondary | ICD-10-CM

## 2022-05-27 DIAGNOSIS — I480 Paroxysmal atrial fibrillation: Secondary | ICD-10-CM

## 2022-05-27 LAB — BAYER DCA HB A1C WAIVED: HB A1C (BAYER DCA - WAIVED): 5.8 % — ABNORMAL HIGH (ref 4.8–5.6)

## 2022-05-27 MED ORDER — NEBIVOLOL HCL 5 MG PO TABS: 5.0000 mg | ORAL_TABLET | Freq: Every day | ORAL | 3 refills | Status: DC

## 2022-05-27 MED ORDER — NEBIVOLOL HCL 10 MG PO TABS: 10.0000 mg | ORAL_TABLET | Freq: Every day | ORAL | 3 refills | Status: DC

## 2022-05-27 MED ORDER — APIXABAN 5 MG PO TABS: 5.0000 mg | ORAL_TABLET | Freq: Two times a day (BID) | ORAL | 1 refills | Status: DC

## 2022-05-27 NOTE — Progress Notes (Signed)
BP 110/63   Pulse (!) 54   Temp (!) 97 F (36.1 C)   Ht 5' 9"  (1.753 m)   Wt 134 lb (60.8 kg)   SpO2 (!) 82% Comment: O2 at home, declines O2 today in office  BMI 19.79 kg/m    Subjective:   Patient ID: [name], male    DOB: 1946/09/18, 76 y.o.   MRN: [id number]  HPI: Luis Salinas is a 76 y.o. male presenting on 05/27/2022 for Medical Management of Chronic Issues, Hyperlipidemia, Hypertension, and Diabetes (s)   HPI Type 2 diabetes mellitus Patient comes in today for recheck of his diabetes. Patient has been currently taking jardiance. Patient is not currently on an ACE inhibitor/ARB. Patient has not seen an ophthalmologist this year. Patient denies any issues with their feet. The symptom started onset as an adult CAD and A-fib and CHF and hypertension and dyslipidemia ARE RELATED TO DM   COPD Patient is coming in for COPD recheck today.  He is currently on Breztri although admits he does not use it regularly and albuterol as needed.  His oxygen is down today but after sitting and getting some deep breaths he was able to get up to 94%..  He has a mild chronic cough but denies any major coughing spells or wheezing spells.  He has 0 nighttime symptoms per week and 0daytime symptoms per week currently.  He says that he does not have any complaints and this is his baseline and he does not feel short of breath or winded at all.  Hypertension Patient is currently on Bystolic and furosemide, and their blood pressure today is 110/63. Patient denies any lightheadedness or dizziness. Patient denies headaches, blurred vision, chest pains, shortness of breath, or weakness. Denies any side effects from medication and is content with current medication.   Hyperlipidemia Patient is coming in for recheck of his hyperlipidemia. The patient is currently taking Crestor. They deny any issues with myalgias or history of liver damage from it. They deny any focal numbness or weakness or chest pain.    Patient has A-fib and CHF and CAD with stent and history of stroke and STEMI.  He sees cardiology.  Relevant past medical, surgical, family and social history reviewed and updated as indicated. Interim medical history since our last visit reviewed. Allergies and medications reviewed and updated.  Review of Systems  Constitutional:  Negative for chills and fever.  Eyes:  Negative for visual disturbance.  Respiratory:  Negative for cough, chest tightness, shortness of breath and wheezing.   Cardiovascular:  Negative for chest pain and leg swelling.  Musculoskeletal:  Negative for back pain and gait problem.  Skin:  Negative for rash.  Neurological:  Negative for dizziness, weakness and light-headedness.  All other systems reviewed and are negative.   Per HPI unless specifically indicated above   Allergies as of 05/27/2022   No Known Allergies      Medication List        Accurate as of May 27, 2022  8:53 AM. If you have any questions, ask your nurse or doctor.          STOP taking these medications    ICAPS AREDS FORMULA PO Stopped by: Fransisca [facility] Shalicia Craghead, MD       TAKE these medications    Accu-Chek Guide test strip Generic drug: glucose blood USE TO CHECK SUGAR UP TO 4 TIMES DAILY AS DIRECTED   Accu-Chek Softclix Lancets lancets SMARTSIG:Topical 1-4 Times Daily  Albuterol Sulfate 108 (90 Base) MCG/ACT Aepb Commonly known as: PROAIR RESPICLICK Inhale 2 puffs into the lungs every 6 (six) hours as needed (shortness of breath/ wheeze).   apixaban 5 MG Tabs tablet Commonly known as: Eliquis Take 1 tablet (5 mg total) by mouth 2 (two) times daily. What changed: how much to take Changed by: Worthy Rancher, MD   blood glucose meter kit and supplies Kit Dispense based on patient and insurance preference. Use up to four times daily as directed.   Breztri Aerosphere 160-9-4.8 MCG/ACT Aero Generic drug: Budeson-Glycopyrrol-Formoterol Inhale 2 puffs into  the lungs 2 (two) times daily.   [facility] Aspirin Low Strength 81 MG tablet Generic drug: aspirin EC TAKE 1 TABLET (81 MG TOTAL) BY MOUTH DAILY. SWALLOW WHOLE.   diphenhydramine-acetaminophen 25-500 MG Tabs tablet Commonly known as: TYLENOL PM Take 1 tablet by mouth at bedtime as needed (sleep/pain.).   furosemide 40 MG tablet Commonly known as: LASIX Take 1 tablet (40 mg total) by mouth daily.   Jardiance 10 MG Tabs tablet Generic drug: empagliflozin TAKE 1 TABLET BY MOUTH EVERY DAY   nebivolol 10 MG tablet Commonly known as: Bystolic Take 1.5 tablets (15 mg total) by mouth daily.   nitroGLYCERIN 0.4 MG SL tablet Commonly known as: NITROSTAT Place 1 tablet (0.4 mg total) under the tongue every 5 (five) minutes as needed for chest pain.   OXYGEN Inhale 4 L into the lungs daily.   potassium chloride SA 20 MEQ tablet Commonly known as: KLOR-CON M Take 1 tablet (20 mEq total) by mouth daily.   rosuvastatin 40 MG tablet Commonly known as: CRESTOR Take 1 tablet (40 mg total) by mouth daily.   Salonpas Pain Relief Patch Ptch Place 1 patch onto the skin daily as needed (pain.).         Objective:   BP 110/63   Pulse (!) 54   Temp (!) 97 F (36.1 C)   Ht 5' 9"  (1.753 m)   Wt 134 lb (60.8 kg)   SpO2 (!) 82% Comment: O2 at home, declines O2 today in office  BMI 19.79 kg/m   Wt Readings from Last 3 Encounters:  05/27/22 134 lb (60.8 kg)  03/18/22 139 lb 12.8 oz (63.4 kg)  02/17/22 140 lb 9.6 oz (63.8 kg)    Physical Exam Vitals and nursing note reviewed.  Constitutional:      General: He is not in acute distress.    Appearance: He is well-developed. He is not diaphoretic.  Eyes:     General: No scleral icterus.    Conjunctiva/sclera: Conjunctivae normal.  Neck:     Thyroid: No thyromegaly.  Cardiovascular:     Rate and Rhythm: Normal rate and regular rhythm.     Heart sounds: Normal heart sounds. No murmur heard. Pulmonary:     Effort: Pulmonary effort is  normal. No respiratory distress.     Breath sounds: Normal breath sounds. No wheezing.  Musculoskeletal:        General: Normal range of motion.     Cervical back: Neck supple.  Lymphadenopathy:     Cervical: No cervical adenopathy.  Skin:    General: Skin is warm and dry.     Findings: No rash.  Neurological:     Mental Status: He is alert and oriented to person, place, and time.     Coordination: Coordination normal.  Psychiatric:        Behavior: Behavior normal.       Assessment &  Plan:   Problem List Items Addressed This Visit       Cardiovascular and Mediastinum   Hypertension   Relevant Medications   apixaban (ELIQUIS) 5 MG TABS tablet   Other Relevant Orders   CBC with Differential/Platelet   CMP14+EGFR   Lipid panel   Bayer DCA Hb A1c Waived   CAD in native artery   Relevant Medications   apixaban (ELIQUIS) 5 MG TABS tablet   Paroxysmal atrial fibrillation (HCC)   Relevant Medications   apixaban (ELIQUIS) 5 MG TABS tablet   Chronic diastolic CHF (congestive heart failure) (HCC)   Relevant Medications   apixaban (ELIQUIS) 5 MG TABS tablet     Respiratory   COPD (chronic obstructive pulmonary disease) ([place])   Relevant Orders   CT CHEST LUNG CA SCREEN LOW DOSE W/O CM     Endocrine   Type 2 diabetes mellitus ([place]) - Primary   Relevant Orders   CBC with Differential/Platelet   CMP14+EGFR   Lipid panel   Bayer DCA Hb A1c Waived     Other   Dyslipidemia   Other Visit Diagnoses     Mixed hyperlipidemia       Relevant Medications   apixaban (ELIQUIS) 5 MG TABS tablet   Other Relevant Orders   CBC with Differential/Platelet   CMP14+EGFR   Lipid panel   Bayer DCA Hb A1c Waived   Prostate cancer screening       Relevant Orders   PSA, total and free   AF (paroxysmal atrial fibrillation) (HCC)       Relevant Medications   apixaban (ELIQUIS) 5 MG TABS tablet       We will order low-dose CT for lung cancer screening, having some weight loss and  decreased appetite.  A1c looks good at 5.8.  Oxygen came up to 94% in the room, discussed having home oxygen and patient declines at this point.  He says he does not feel short of breath and feels normal. Follow up plan: Return in about 6 months (around 11/26/2022), or if symptoms worsen or fail to improve, for Prediabetes and COPD and hypertension.  Counseling provided for all of the vaccine components Orders Placed This Encounter  Procedures   CT CHEST LUNG CA SCREEN LOW DOSE W/O CM   CBC with Differential/Platelet   CMP14+EGFR   Lipid panel   PSA, total and free   Bayer DCA Hb A1c Waived    Caryl Pina, MD [facility] 05/27/2022, 8:53 AM

## 2022-05-27 NOTE — Telephone Encounter (Signed)
Contacted [facility] to clarify issue with bystolic 15 mg. Last filled 10 mg taking 1.5 mg daily 05/02/2022  Per [facility], alert given that bystolic 10 mg Rx refill is too soon, maximum daily tablet is 1 per day of 10 mg tablet Per [facility] pharmacist, last filled 10 mg 05/16/2022 #90 New rx called in for bystolic 5 mg to be taken daily with 10 mg tablet  Wife Robin informed and verbalized understanding of plan

## 2022-05-27 NOTE — Telephone Encounter (Signed)
Pt c/o medication issue:  1. Name of Medication: nebivolol (BYSTOLIC) 10 MG tablet  2. How are you currently taking this medication (dosage and times per day)? 1 1/2 tab daily 15 mg daily  3. Are you having a reaction (difficulty breathing--STAT)? No  4. What is your medication issue?  Pt's wife is calling stating that their insurance has stopped paying for the 1/2 tablet increase pt has been taking. Only allowing for 1 tab daily. She states she has still been giving patient 1 1/2 tab, but he is running out and would like to know what they need to do to get this increased. Please advise.

## 2022-05-28 LAB — LIPID PANEL
Chol/HDL Ratio: 2.3 ratio (ref 0.0–5.0)
Cholesterol, Total: 100 mg/dL (ref 100–199)
HDL: 44 mg/dL (ref >39)
LDL Chol Calc (NIH): 39 mg/dL (ref 0–99)
Triglycerides: 84 mg/dL (ref 0–149)
VLDL Cholesterol Cal: 17 mg/dL (ref 5–40)

## 2022-05-28 LAB — CBC WITH DIFFERENTIAL/PLATELET
Basophils Absolute: 0.1 10*3/uL (ref 0.0–0.2)
Basos: 1 %
EOS (ABSOLUTE): 0.5 10*3/uL — ABNORMAL HIGH (ref 0.0–0.4)
Eos: 5 %
Hematocrit: 49.7 % (ref 37.5–51.0)
Hemoglobin: 16.2 g/dL (ref 13.0–17.7)
Immature Grans (Abs): 0 10*3/uL (ref 0.0–0.1)
Immature Granulocytes: 0 %
Lymphocytes Absolute: 2.9 10*3/uL (ref 0.7–3.1)
Lymphs: 29 %
MCH: 28.3 pg (ref 26.6–33.0)
MCHC: 32.6 g/dL (ref 31.5–35.7)
MCV: 87 fL (ref 79–97)
Monocytes Absolute: 0.7 10*3/uL (ref 0.1–0.9)
Monocytes: 7 %
Neutrophils Absolute: 5.6 10*3/uL (ref 1.4–7.0)
Neutrophils: 58 %
Platelets: 171 10*3/uL (ref 150–450)
RBC: 5.72 x10E6/uL (ref 4.14–5.80)
RDW: 14.2 % (ref 11.6–15.4)
WBC: 9.7 10*3/uL (ref 3.4–10.8)

## 2022-05-28 LAB — CMP14+EGFR
ALT: 25 IU/L (ref 0–44)
AST: 23 IU/L (ref 0–40)
Albumin/Globulin Ratio: 1.7 (ref 1.2–2.2)
Albumin: 4.5 g/dL (ref 3.7–4.7)
Alkaline Phosphatase: 122 IU/L — ABNORMAL HIGH (ref 44–121)
BUN/Creatinine Ratio: 16 (ref 10–24)
BUN: 16 mg/dL (ref 8–27)
Bilirubin Total: 0.5 mg/dL (ref 0.0–1.2)
CO2: 26 mmol/L (ref 20–29)
Calcium: 9.5 mg/dL (ref 8.6–10.2)
Chloride: 101 mmol/L (ref 96–106)
Creatinine, Ser: 1.03 mg/dL (ref 0.76–1.27)
Globulin, Total: 2.6 g/dL (ref 1.5–4.5)
Glucose: 99 mg/dL (ref 70–99)
Potassium: 4.4 mmol/L (ref 3.5–5.2)
Sodium: 142 mmol/L (ref 134–144)
Total Protein: 7.1 g/dL (ref 6.0–8.5)
eGFR: 75 mL/min/{1.73_m2} (ref >59)

## 2022-05-28 LAB — PSA, TOTAL AND FREE
PSA, Free Pct: 30.8 %
PSA, Free: 0.37 ng/mL
Prostate Specific Ag, Serum: 1.2 ng/mL (ref 0.0–4.0)

## 2022-06-05 ENCOUNTER — Ambulatory Visit (INDEPENDENT_AMBULATORY_CARE_PROVIDER_SITE_OTHER): Payer: Medicare Other

## 2022-06-05 VITALS — Wt 134.0 lb

## 2022-06-05 DIAGNOSIS — Z0000 Encounter for general adult medical examination without abnormal findings: Secondary | ICD-10-CM | POA: Diagnosis not present

## 2022-06-05 NOTE — Progress Notes (Signed)
Subjective:   Luis Salinas is a 76 y.o. male who presents for Medicare Annual/Subsequent preventive examination.  Virtual Visit via Telephone Note  I connected with  Luis Salinas on 06/05/22 at  9:00 AM EDT by telephone and verified that I am speaking with the correct person using two identifiers.  Location: Patient: Home Provider: [facility] Persons participating in the virtual visit: patient/Nurse Health Advisor   I discussed the limitations, risks, security and privacy concerns of performing an evaluation and management service by telephone and the availability of in person appointments. The patient expressed understanding and agreed to proceed.  Interactive audio and video telecommunications were attempted between this nurse and patient, however failed, due to patient having technical difficulties OR patient did not have access to video capability.  We continued and completed visit with audio only.  Some vital signs may be absent or patient reported.   Jupiter Kabir E Burnice Oestreicher, LPN   Review of Systems     Cardiac Risk Factors include: advanced age (>77mn, >>57women);sedentary lifestyle;dyslipidemia;hypertension;diabetes mellitus;male gender;smoking/ tobacco exposure;Other (see comment), Risk factor comments: COPD, chronic respiratory failure on oxygen, CHF, A.Fib, hx of MI, CAD     Objective:    Today's Vitals   06/05/22 0904  Weight: 134 lb (60.8 kg)  PainSc: 3    Body mass index is 19.79 kg/m.     06/05/2022    9:10 AM 11/10/2021    1:35 PM 09/20/2021    4:00 AM 09/19/2021    5:41 PM 06/04/2021    9:42 AM 06/26/2019    3:04 PM 03/30/2018    9:06 AM  Advanced Directives  Does Patient Have a Medical Advance Directive? Yes No No No Yes No No  Type of [place] will    [place] will    Copy of [place]? No - copy requested    No - copy requested    Would patient like information on  creating a medical advance directive?   No - Patient declined No - Patient declined  No - Patient declined No - Patient declined    Current Medications (verified) Outpatient Encounter Medications as of 06/05/2022  Medication Sig   ACCU-CHEK GUIDE test strip USE TO CHECK SUGAR UP TO 4 TIMES DAILY AS DIRECTED   Accu-Chek Softclix Lancets lancets SMARTSIG:Topical 1-4 Times Daily   Albuterol Sulfate 108 (90 Base) MCG/ACT AEPB Inhale 2 puffs into the lungs every 6 (six) hours as needed (shortness of breath/ wheeze).   apixaban (ELIQUIS) 5 MG TABS tablet Take 1 tablet (5 mg total) by mouth 2 (two) times daily.   blood glucose meter kit and supplies KIT Dispense based on patient and insurance preference. Use up to four times daily as directed.   Budeson-Glycopyrrol-Formoterol (BREZTRI AEROSPHERE) 160-9-4.8 MCG/ACT AERO Inhale 2 puffs into the lungs 2 (two) times daily.   [employer] ASPIRIN LOW STRENGTH 81 MG EC tablet TAKE 1 TABLET (81 MG TOTAL) BY MOUTH DAILY. SWALLOW WHOLE.   diphenhydramine-acetaminophen (TYLENOL PM) 25-500 MG TABS tablet Take 1 tablet by mouth at bedtime as needed (sleep/pain.).   furosemide (LASIX) 40 MG tablet Take 1 tablet (40 mg total) by mouth daily.   JARDIANCE 10 MG TABS tablet TAKE 1 TABLET BY MOUTH EVERY DAY   Menthol-Methyl Salicylate (SALONPAS PAIN RELIEF PATCH) PTCH Place 1 patch onto the skin daily as needed (pain.).   nebivolol (BYSTOLIC) 10 MG tablet Take 1 tablet (10 mg total)  by mouth daily.   nebivolol (BYSTOLIC) 5 MG tablet Take 1 tablet (5 mg total) by mouth daily. With 10 mg tablet to equal 15 mg daily   nitroGLYCERIN (NITROSTAT) 0.4 MG SL tablet Place 1 tablet (0.4 mg total) under the tongue every 5 (five) minutes as needed for chest pain.   OXYGEN Inhale 4 L into the lungs daily.   potassium chloride SA (KLOR-CON) 20 MEQ tablet Take 1 tablet (20 mEq total) by mouth daily.   rosuvastatin (CRESTOR) 40 MG tablet Take 1 tablet (40 mg total) by mouth daily.   No  facility-administered encounter medications on file as of 06/05/2022.    Allergies (verified) Patient has no known allergies.   History: Past Medical History:  Diagnosis Date   Amnesia 1967   Was in a coma and had no memory of the past. His family found him after 27 years   Arthritis    Cataract    Chronic diastolic CHF (congestive heart failure) (HCC)    COPD (chronic obstructive pulmonary disease) (HCC)    Dyslipidemia    History of concussion    History of STEMI    a. 11/2007 s/p DES to LCX at [facility]   Hypertension    Paroxysmal atrial fibrillation (HCC)    Pneumonia    exposure to caustic chemicals   Stroke [facility])    multiple strokes   Tobacco abuse    Type 2 diabetes mellitus ([place])    Past Surgical History:  Procedure Laterality Date   ANTERIOR CERVICAL DECOMP/DISCECTOMY FUSION  05/16/2012   Procedure: ANTERIOR CERVICAL DECOMPRESSION/DISCECTOMY FUSION 2 LEVELS;  Surgeon: Kristeen Miss, MD;  Location: [facility] NEURO ORS;  Service: Neurosurgery;  Laterality: N/A;  Cervical Five-Six, Cervical Six-Seven Anterior Cervical Decompression/Diskectomy Fusion   CARDIAC CATHETERIZATION     CARDIOVERSION N/A 11/10/2021   Procedure: CARDIOVERSION;  Surgeon: Josue Hector, MD;  Location: [facility] ENDOSCOPY;  Service: Cardiovascular;  Laterality: N/A;   COLONOSCOPY W/ POLYPECTOMY     CORONARY ANGIOPLASTY     EYE SURGERY     cataracts   JOINT REPLACEMENT     Left knee replacement   JOINT REPLACEMENT     left elbow at age 42   Bend     left   TONSILLECTOMY     Family History  Problem Relation Age of Onset   Heart disease Mother    COPD Father    Aneurysm Father    Anesthesia problems Neg Hx    Hypotension Neg Hx    Malignant hyperthermia Neg Hx    Pseudochol deficiency Neg Hx    Social History   Socioeconomic History   Marital status: Married    Spouse name: Shirlean Mylar   Number of children: 7   Years of education: college   Highest education level: Some college, no degree   Occupational History   Occupation: Retired    Comment: Museum/gallery curator  Tobacco Use   Smoking status: Light Smoker    Types: Cigars    Last attempt to quit: 04/03/2013    Years since quitting: 9.1   Smokeless tobacco: Never   Tobacco comments:    1-2 cigars per day.  Smoked 40 years cigarettes.  10 years ago quit cigarettes.  Vaping Use   Vaping Use: Never used  Substance and Sexual Activity   Alcohol use: No    Alcohol/week: 0.0 standard drinks of alcohol   Drug use: Yes    Types: Marijuana    Comment: offer he will smoke  01/20/22   Sexual activity: Yes  Other Topics Concern   Not on file  Social History Narrative   Lives with his fourth wife.    Says his family found him in 1992 and he had no recollection of his childhood -believes to have had several head injuries    Social Determinants of Health   Financial Resource Strain: Low Risk  (06/05/2022)   Overall Financial Resource Strain (CARDIA)    Difficulty of Paying Living Expenses: Not hard at all  Food Insecurity: No Food Insecurity (06/05/2022)   Hunger Vital Sign    Worried About Running Out of Food in the Last Year: Never true    Ran Out of Food in the Last Year: Never true  Transportation Needs: No Transportation Needs (06/05/2022)   PRAPARE - Hydrologist (Medical): No    Lack of Transportation (Non-Medical): No  Physical Activity: Insufficiently Active (06/05/2022)   Exercise Vital Sign    Days of Exercise per Week: 7 days    Minutes of Exercise per Session: 10 min  Stress: No Stress Concern Present (06/05/2022)   [place]    Feeling of Stress : Not at all  Social Connections: [place] (06/05/2022)   Social Connection and Isolation Panel [NHANES]    Frequency of Communication with Friends and Family: More than three times a week    Frequency of Social Gatherings with Friends and Family: More than three times a week     Attends Religious Services: 1 to 4 times per year    Active Member of [employer] or Organizations: Yes    Attends Archivist Meetings: 1 to 4 times per year    Marital Status: Married    Tobacco Counseling Ready to quit: Not Answered Counseling given: Not Answered Tobacco comments: 1-2 cigars per day.  Smoked 40 years cigarettes.  10 years ago quit cigarettes.   Clinical Intake:  Pre-visit preparation completed: Yes  Pain : 0-10 Pain Score: 3  Pain Type: Chronic pain Pain Location: Generalized Pain Descriptors / Indicators: Aching, Discomfort Pain Onset: More than a month ago     BMI - recorded: 19.79 Nutritional Status: BMI of 19-24  Normal Nutritional Risks: None Diabetes: Yes CBG done?: No Did pt. bring in CBG monitor from home?: No  How often do you need to have someone help you when you read instructions, pamphlets, or other written materials from your doctor or pharmacy?: 1 - Never  Diabetic? Nutrition Risk Assessment:  Has the patient had any N/V/D within the last 2 months?  No  Does the patient have any non-healing wounds?  No  Has the patient had any unintentional weight loss or weight gain?  No   Diabetes:  Is the patient diabetic?  Yes  If diabetic, was a CBG obtained today?  No  Did the patient bring in their glucometer from home?  No  How often do you monitor your CBG's? Twice per week.   Financial Strains and Diabetes Management:  Are you having any financial strains with the device, your supplies or your medication? No .  Does the patient want to be seen by Chronic Care Management for management of their diabetes?  No  Would the patient like to be referred to a Nutritionist or for Diabetic Management?  No   Diabetic Exams:  Diabetic Eye Exam: Completed 2022 per patient - requesting record.  Diabetic Foot Exam: Completed 05/27/2022. Pt has  been advised about the importance in completing this exam.   Interpreter Needed?:  No  Information entered by :: Kameria Canizares, LPN   Activities of Daily Living    06/05/2022    9:10 AM 09/20/2021    4:00 AM  In your present state of health, do you have any difficulty performing the following activities:  Hearing? 0 0  Vision? 0 0  Difficulty concentrating or making decisions? 0 0  Walking or climbing stairs? 1 0  Comment hurts joints, gets out of breath   Dressing or bathing? 0 0  Doing errands, shopping? 0 1  Preparing Food and eating ? N   Using the Toilet? N   In the past six months, have you accidently leaked urine? N   Do you have problems with loss of bowel control? N   Managing your Medications? N   Managing your Finances? N   Housekeeping or managing your Housekeeping? N     Patient Care Team: Dettinger, Fransisca Kaufmann, MD as PCP - General (Family Medicine) Donato Heinz, MD as PCP - Cardiology (Cardiology) Garvin Fila, MD as Consulting Physician (Neurology) Gatha Mayer, MD as Consulting Physician (Gastroenterology) Leticia Clas, OD (Optometry)  Indicate any recent Medical Services you may have received from other than [facility] providers in the past year (date may be approximate).     Assessment:   This is a routine wellness examination for Luis Salinas.  Hearing/Vision screen Hearing Screening - Comments:: Denies hearing difficulties   Vision Screening - Comments:: Wears rx glasses - up to date with routine eye exams with [facility] in [place] issues and exercise activities discussed: Current Exercise Habits: Home exercise routine, Type of exercise: walking;stretching, Time (Minutes): 10, Frequency (Times/Week): 7, Weekly Exercise (Minutes/Week): 70, Intensity: Mild, Exercise limited by: cardiac condition(s);respiratory conditions(s);orthopedic condition(s)   Goals Addressed             This Visit's Progress    DIET - INCREASE WATER INTAKE   On track    Try to drink 6-8 glasses of water daily.       Depression  Screen    06/05/2022    9:08 AM 05/27/2022    8:12 AM 11/26/2021    8:22 AM 10/01/2021   10:04 AM 09/04/2021    9:31 AM 09/04/2021    8:44 AM 06/23/2021    9:24 AM  PHQ 2/9 Scores  PHQ - 2 Score 0 0 0 0 0 0 0  PHQ- 9 Score 0 0 0 3 3      Fall Risk    06/05/2022    9:05 AM 05/27/2022    8:12 AM 11/26/2021    8:22 AM 10/01/2021   10:04 AM 09/04/2021    9:30 AM  Fall Risk   Falls in the past year? 0 0 0 0 0  Number falls in past yr: 0      Injury with Fall? 0      Risk for fall due to : Orthopedic patient      Follow up Falls prevention discussed        [place]:  Any stairs in or around the home? No  If so, are there any without handrails? No  Home free of loose throw rugs in walkways, pet beds, electrical cords, etc? Yes  Adequate lighting in your home to reduce risk of falls? Yes   ASSISTIVE DEVICES UTILIZED TO PREVENT FALLS:  Life alert? No  Use of a cane, walker or w/c? No  Grab bars in the bathroom? No  Shower chair or bench in shower? Yes  Elevated toilet seat or a handicapped toilet? No   TIMED UP AND GO:  Was the test performed? No . Telephonic visit  Cognitive Function:    03/30/2018    9:08 AM 10/20/2016    8:47 AM 05/17/2015    2:19 PM  MMSE - Mini Mental State Exam  Orientation to time 4 5 5   Orientation to Place 5 5 5   Registration 3 3 3   Attention/ Calculation 5 3 5   Recall 2 3 3   Language- name 2 objects 2 2 2   Language- repeat 1 1 1   Language- follow 3 step command 3 3 3   Language- read & follow direction 1 1 1   Write a sentence 1 1 1   Copy design 1 1 1   Total score 28 28 30         06/05/2022    9:11 AM 06/04/2021    9:16 AM 06/26/2019    3:08 PM  6CIT Screen  What Year? 0 points 0 points 0 points  What month? 0 points 0 points 0 points  What time? 0 points 0 points 0 points  Count back from 20 0 points 0 points 0 points  Months in reverse 4 points 2 points 2 points  Repeat phrase 6 points 4 points 4  points  Total Score 10 points 6 points 6 points    Immunizations Immunization History  Administered Date(s) Administered   Pneumococcal Conjugate-13 03/29/2015   Pneumococcal Polysaccharide-23 10/14/2002, 12/27/2013   Td 12/14/2008, 12/27/2013    TDAP status: Up to date  Flu Vaccine status: Declined, Education has been provided regarding the importance of this vaccine but patient still declined. Advised may receive this vaccine at local pharmacy or Health Dept. Aware to provide a copy of the vaccination record if obtained from local pharmacy or Health Dept. Verbalized acceptance and understanding.  Pneumococcal vaccine status: Up to date  Covid-19 vaccine status: Declined, Education has been provided regarding the importance of this vaccine but patient still declined. Advised may receive this vaccine at local pharmacy or Health Dept.or vaccine clinic. Aware to provide a copy of the vaccination record if obtained from local pharmacy or Health Dept. Verbalized acceptance and understanding.  Qualifies for Shingles Vaccine? Yes   Zostavax completed No   Shingrix Completed?: No.    Education has been provided regarding the importance of this vaccine. Patient has been advised to call insurance company to determine out of pocket expense if they have not yet received this vaccine. Advised may also receive vaccine at local pharmacy or Health Dept. Verbalized acceptance and understanding.  Screening Tests Health Maintenance  Topic Date Due   URINE MICROALBUMIN  Never done   OPHTHALMOLOGY EXAM  10/26/2022 (Originally 11/13/1956)   COVID-19 Vaccine (1) 05/24/2023 (Originally 05/12/1947)   Zoster Vaccines- Shingrix (1 of 2) 05/24/2023 (Originally 11/12/1965)   HEMOGLOBIN A1C  11/26/2022   FOOT EXAM  05/28/2023   TETANUS/TDAP  12/28/2023   Pneumonia Vaccine 10+ Years old  Completed   Hepatitis C Screening  Completed   HPV VACCINES  Aged Out   INFLUENZA VACCINE  Discontinued   COLONOSCOPY (Pts  45-48yr Insurance coverage will need to be confirmed)  Discontinued    Health Maintenance  Health Maintenance Due  Topic Date Due   URINE MICROALBUMIN  Never done    Colorectal cancer screening: No longer required.  Lung Cancer Screening: (Low Dose CT Chest recommended if Age 16-80 years, 30 pack-year currently smoking OR have quit w/in 15years.) does qualify. Dr Dettinger placed order earlier this month.  Additional Screening:  Hepatitis C Screening: does qualify; Completed 11/22/2020  Vision Screening: Recommended annual ophthalmology exams for early detection of glaucoma and other disorders of the eye. Is the patient up to date with their annual eye exam?  Yes  Who is the provider or what is the name of the office in which the patient attends annual eye exams? [facility] in [place] If pt is not established with a provider, would they like to be referred to a provider to establish care? No .   Dental Screening: Recommended annual dental exams for proper oral hygiene  Community Resource Referral / Chronic Care Management: CRR required this visit?  No   CCM required this visit?  No      Plan:     I have personally reviewed and noted the following in the patient's chart:   Medical and social history Use of alcohol, tobacco or illicit drugs  Current medications and supplements including opioid prescriptions. Patient is not currently taking opioid prescriptions. Functional ability and status Nutritional status Physical activity Advanced directives List of other physicians Hospitalizations, surgeries, and ER visits in previous 12 months Vitals Screenings to include cognitive, depression, and falls Referrals and appointments  In addition, I have reviewed and discussed with patient certain preventive protocols, quality metrics, and best practice recommendations. A written personalized care plan for preventive services as well as general preventive health  recommendations were provided to patient.     Sandrea Hammond, LPN   07/14/6552   Nurse Notes: None

## 2022-06-12 ENCOUNTER — Other Ambulatory Visit: Payer: Self-pay | Admitting: Family Medicine

## 2022-06-12 DIAGNOSIS — R7303 Prediabetes: Secondary | ICD-10-CM

## 2022-06-12 DIAGNOSIS — I5043 Acute on chronic combined systolic (congestive) and diastolic (congestive) heart failure: Secondary | ICD-10-CM

## 2022-08-12 ENCOUNTER — Other Ambulatory Visit: Payer: Self-pay | Admitting: Family Medicine

## 2022-08-12 DIAGNOSIS — I480 Paroxysmal atrial fibrillation: Secondary | ICD-10-CM

## 2022-10-11 ENCOUNTER — Other Ambulatory Visit: Payer: Self-pay | Admitting: Family Medicine

## 2022-10-11 DIAGNOSIS — I5043 Acute on chronic combined systolic (congestive) and diastolic (congestive) heart failure: Secondary | ICD-10-CM

## 2022-11-20 ENCOUNTER — Other Ambulatory Visit: Payer: Self-pay | Admitting: Family Medicine

## 2022-11-20 DIAGNOSIS — I5043 Acute on chronic combined systolic (congestive) and diastolic (congestive) heart failure: Secondary | ICD-10-CM

## 2022-11-20 DIAGNOSIS — R7303 Prediabetes: Secondary | ICD-10-CM

## 2022-11-27 ENCOUNTER — Encounter: Payer: Self-pay | Admitting: Family Medicine

## 2022-11-27 ENCOUNTER — Ambulatory Visit (INDEPENDENT_AMBULATORY_CARE_PROVIDER_SITE_OTHER): Payer: Medicare Other | Admitting: Family Medicine

## 2022-11-27 VITALS — BP 104/52 | HR 61 | Temp 97.4°F | Ht 69.0 in | Wt 134.0 lb

## 2022-11-27 DIAGNOSIS — I5032 Chronic diastolic (congestive) heart failure: Secondary | ICD-10-CM

## 2022-11-27 DIAGNOSIS — I2510 Atherosclerotic heart disease of native coronary artery without angina pectoris: Secondary | ICD-10-CM

## 2022-11-27 DIAGNOSIS — R7303 Prediabetes: Secondary | ICD-10-CM

## 2022-11-27 DIAGNOSIS — E1169 Type 2 diabetes mellitus with other specified complication: Secondary | ICD-10-CM | POA: Diagnosis not present

## 2022-11-27 DIAGNOSIS — I10 Essential (primary) hypertension: Secondary | ICD-10-CM

## 2022-11-27 DIAGNOSIS — J449 Chronic obstructive pulmonary disease, unspecified: Secondary | ICD-10-CM

## 2022-11-27 DIAGNOSIS — E785 Hyperlipidemia, unspecified: Secondary | ICD-10-CM

## 2022-11-27 DIAGNOSIS — E782 Mixed hyperlipidemia: Secondary | ICD-10-CM

## 2022-11-27 DIAGNOSIS — I5043 Acute on chronic combined systolic (congestive) and diastolic (congestive) heart failure: Secondary | ICD-10-CM

## 2022-11-27 DIAGNOSIS — I480 Paroxysmal atrial fibrillation: Secondary | ICD-10-CM

## 2022-11-27 LAB — BAYER DCA HB A1C WAIVED: HB A1C (BAYER DCA - WAIVED): 6.5 % — ABNORMAL HIGH (ref 4.8–5.6)

## 2022-11-27 MED ORDER — POTASSIUM CHLORIDE CRYS ER 20 MEQ PO TBCR: 20.0000 meq | EXTENDED_RELEASE_TABLET | Freq: Every day | ORAL | 3 refills | Status: DC

## 2022-11-27 MED ORDER — FUROSEMIDE 40 MG PO TABS: 40.0000 mg | ORAL_TABLET | Freq: Every day | ORAL | 3 refills | Status: DC

## 2022-11-27 MED ORDER — ROSUVASTATIN CALCIUM 40 MG PO TABS: 40.0000 mg | ORAL_TABLET | Freq: Every day | ORAL | 3 refills | Status: DC

## 2022-11-27 MED ORDER — EMPAGLIFLOZIN 10 MG PO TABS: 10.0000 mg | ORAL_TABLET | Freq: Every day | ORAL | 3 refills | Status: DC

## 2022-11-27 NOTE — Progress Notes (Signed)
BP (!) 104/52   Pulse 61   Temp (!) 97.4 F (36.3 C)   Ht _0  (1.753 m)   Wt 134 lb (60.8 kg)   SpO2 90%   BMI 19.79 kg/m    Subjective:   [name], male    DOB: 11-Mar-1946, 76 y.o.   MRN: [id number]  HPI: Luis Salinas is a 76 y.o. male presenting on 11/27/2022 for Medical Management of Chronic Issues, Hyperlipidemia, and Diabetes   HPI Type 2 diabetes mellitus Patient comes in today for recheck of his diabetes. Patient has been currently taking Jardiance. Patient is not currently on an ACE inhibitor/ARB. Patient has not seen an ophthalmologist this year. Patient denies any issues with their feet. The symptom started onset as an adult hyperlipidemia and hypertension and A-fib and CHF and CAD ARE RELATED TO DM  COPD Patient is coming in for COPD recheck today.  He is currently on albuterol and Breztri and 4 L nasal cannula at home oxygen.  He has a mild chronic cough but denies any major coughing spells or wheezing spells.  He has 3 nighttime symptoms per week and 3 daytime symptoms per week currently.  But for him he feels like he is pretty stable and not any better or worse.  Hyperlipidemia Patient is coming in for recheck of his hyperlipidemia. The patient is currently taking rosuvastatin. They deny any issues with myalgias or history of liver damage from it. They deny any focal numbness or weakness or chest pain.   Hypertension and A-fib and CHF and CAD Patient is currently on Eliquis and furosemide and Bystolic, and their blood pressure today is 104/52. Patient denies any lightheadedness or dizziness. Patient denies headaches, blurred vision, chest pains, shortness of breath, or weakness. Denies any side effects from medication and is content with current medication.   Relevant past medical, surgical, family and social history reviewed and updated as indicated. Interim medical history since our last visit reviewed. Allergies and medications reviewed and  updated.  Review of Systems  Constitutional:  Negative for chills and fever.  Respiratory:  Positive for cough and wheezing. Negative for shortness of breath.   Cardiovascular:  Negative for chest pain and leg swelling.  Musculoskeletal:  Negative for back pain and gait problem.  Skin:  Negative for rash.  Neurological:  Negative for dizziness, weakness and numbness.  All other systems reviewed and are negative.   Per HPI unless specifically indicated above   Allergies as of 11/27/2022   No Known Allergies      Medication List        Accurate as of November 27, 2022  8:24 AM. If you have any questions, ask your nurse or doctor.          Accu-Chek Guide test strip Generic drug: glucose blood USE TO CHECK SUGAR UP TO 4 TIMES DAILY AS DIRECTED   Accu-Chek Softclix Lancets lancets SMARTSIG:Topical 1-4 Times Daily   Albuterol Sulfate 108 (90 Base) MCG/ACT Aepb Commonly known as: PROAIR RESPICLICK Inhale 2 puffs into the lungs every 6 (six) hours as needed (shortness of breath/ wheeze).   Aspirin Low Dose 81 MG tablet Generic drug: aspirin EC TAKE 1 TABLET (81 MG TOTAL) BY MOUTH DAILY. SWALLOW WHOLE.   blood glucose meter kit and supplies Kit Dispense based on patient and insurance preference. Use up to four times daily as directed.   Breztri Aerosphere 160-9-4.8 MCG/ACT Aero Generic drug: Budeson-Glycopyrrol-Formoterol Inhale 2 puffs into the lungs  2 (two) times daily.   diphenhydramine-acetaminophen 25-500 MG Tabs tablet Commonly known as: TYLENOL PM Take 1 tablet by mouth at bedtime as needed (sleep/pain.).   Eliquis 5 MG Tabs tablet Generic drug: apixaban TAKE 1 TABLET BY MOUTH TWICE A DAY   empagliflozin 10 MG Tabs tablet Commonly known as: Jardiance Take 1 tablet (10 mg total) by mouth daily. What changed: how much to take Changed by: Worthy Rancher, MD   furosemide 40 MG tablet Commonly known as: LASIX Take 1 tablet (40 mg total) by mouth  daily.   nebivolol 10 MG tablet Commonly known as: Bystolic Take 1 tablet (10 mg total) by mouth daily.   nebivolol 5 MG tablet Commonly known as: Bystolic Take 1 tablet (5 mg total) by mouth daily. With 10 mg tablet to equal 15 mg daily   nitroGLYCERIN 0.4 MG SL tablet Commonly known as: NITROSTAT Place 1 tablet (0.4 mg total) under the tongue every 5 (five) minutes as needed for chest pain.   OXYGEN Inhale 4 L into the lungs daily.   potassium chloride SA 20 MEQ tablet Commonly known as: Klor-Con M20 Take 1 tablet (20 mEq total) by mouth daily.   rosuvastatin 40 MG tablet Commonly known as: CRESTOR Take 1 tablet (40 mg total) by mouth daily.   Salonpas Pain Relief Patch Ptch Place 1 patch onto the skin daily as needed (pain.).         Objective:   BP (!) 104/52   Pulse 61   Temp (!) 97.4 F (36.3 C)   Ht _0  (1.753 m)   Wt 134 lb (60.8 kg)   SpO2 90%   BMI 19.79 kg/m   Wt Readings from Last 3 Encounters:  11/27/22 134 lb (60.8 kg)  06/05/22 134 lb (60.8 kg)  05/27/22 134 lb (60.8 kg)    Physical Exam Vitals and nursing note reviewed.  Constitutional:      General: He is not in acute distress.    Appearance: He is well-developed. He is not diaphoretic.  Eyes:     General: No scleral icterus.    Conjunctiva/sclera: Conjunctivae normal.  Neck:     Thyroid: No thyromegaly.  Cardiovascular:     Rate and Rhythm: Normal rate and regular rhythm.     Heart sounds: Normal heart sounds. No murmur heard. Pulmonary:     Effort: Pulmonary effort is normal. No respiratory distress.     Breath sounds: Wheezing present. No rhonchi or rales.  Chest:     Chest wall: No tenderness.  Musculoskeletal:        General: No swelling. Normal range of motion.     Cervical back: Neck supple.  Lymphadenopathy:     Cervical: No cervical adenopathy.  Skin:    General: Skin is warm and dry.     Findings: No rash.  Neurological:     Mental Status: He is alert and  oriented to person, place, and time.     Coordination: Coordination normal.  Psychiatric:        Behavior: Behavior normal.       Assessment & Plan:   Problem List Items Addressed This Visit       Cardiovascular and Mediastinum   Hypertension   Relevant Medications   furosemide (LASIX) 40 MG tablet   rosuvastatin (CRESTOR) 40 MG tablet   Other Relevant Orders   CBC with Differential/Platelet   CMP14+EGFR   Lipid panel   Bayer DCA Hb A1c Waived   CAD  in native artery   Relevant Medications   furosemide (LASIX) 40 MG tablet   rosuvastatin (CRESTOR) 40 MG tablet   Paroxysmal atrial fibrillation (HCC)   Relevant Medications   furosemide (LASIX) 40 MG tablet   rosuvastatin (CRESTOR) 40 MG tablet   Chronic diastolic CHF (congestive heart failure) (HCC)   Relevant Medications   furosemide (LASIX) 40 MG tablet   rosuvastatin (CRESTOR) 40 MG tablet     Respiratory   COPD (chronic obstructive pulmonary disease) (HCC)     Endocrine   Type 2 diabetes mellitus (HCC) - Primary   Relevant Medications   empagliflozin (JARDIANCE) 10 MG TABS tablet   rosuvastatin (CRESTOR) 40 MG tablet   Other Relevant Orders   CBC with Differential/Platelet   CMP14+EGFR   Lipid panel   Bayer DCA Hb A1c Waived   Microalbumin / creatinine urine ratio     Other   Dyslipidemia   Relevant Medications   rosuvastatin (CRESTOR) 40 MG tablet   Other Visit Diagnoses     Mixed hyperlipidemia       Relevant Medications   furosemide (LASIX) 40 MG tablet   rosuvastatin (CRESTOR) 40 MG tablet   Other Relevant Orders   CBC with Differential/Platelet   CMP14+EGFR   Lipid panel   Bayer DCA Hb A1c Waived   Prediabetes       Relevant Medications   empagliflozin (JARDIANCE) 10 MG TABS tablet   Acute on chronic combined systolic and diastolic congestive heart failure (HCC)       Relevant Medications   empagliflozin (JARDIANCE) 10 MG TABS tablet   furosemide (LASIX) 40 MG tablet   potassium  chloride SA (KLOR-CON M20) 20 MEQ tablet   rosuvastatin (CRESTOR) 40 MG tablet       A1c looks decent at 6.5.  No changes.  Seems to be doing well currently  Blood pressure on the lower side which is why is not on an ACE inhibitor.  Still smoking and no signs of quitting Follow up plan: Return in about 3 months (around 02/26/2023), or if symptoms worsen or fail to improve, for Diabetes and COPD recheck.  Counseling provided for all of the vaccine components Orders Placed This Encounter  Procedures   CBC with Differential/Platelet   CMP14+EGFR   Lipid panel   Bayer DCA Hb A1c Waived   Microalbumin / creatinine urine ratio    Caryl Pina, MD [facility] 11/27/2022, 8:24 AM

## 2022-11-28 LAB — LIPID PANEL
Chol/HDL Ratio: 2.4 ratio (ref 0.0–5.0)
Cholesterol, Total: 123 mg/dL (ref 100–199)
HDL: 52 mg/dL (ref >39)
LDL Chol Calc (NIH): 47 mg/dL (ref 0–99)
Triglycerides: 137 mg/dL (ref 0–149)
VLDL Cholesterol Cal: 24 mg/dL (ref 5–40)

## 2022-11-28 LAB — CBC WITH DIFFERENTIAL/PLATELET
Basophils Absolute: 0.1 10*3/uL (ref 0.0–0.2)
Basos: 1 %
EOS (ABSOLUTE): 0.5 10*3/uL — ABNORMAL HIGH (ref 0.0–0.4)
Eos: 6 %
Hematocrit: 45.5 % (ref 37.5–51.0)
Hemoglobin: 15.1 g/dL (ref 13.0–17.7)
Immature Grans (Abs): 0 10*3/uL (ref 0.0–0.1)
Immature Granulocytes: 0 %
Lymphocytes Absolute: 2.9 10*3/uL (ref 0.7–3.1)
Lymphs: 30 %
MCH: 29.5 pg (ref 26.6–33.0)
MCHC: 33.2 g/dL (ref 31.5–35.7)
MCV: 89 fL (ref 79–97)
Monocytes Absolute: 0.7 10*3/uL (ref 0.1–0.9)
Monocytes: 7 %
Neutrophils Absolute: 5.3 10*3/uL (ref 1.4–7.0)
Neutrophils: 56 %
Platelets: 184 10*3/uL (ref 150–450)
RBC: 5.11 x10E6/uL (ref 4.14–5.80)
RDW: 14 % (ref 11.6–15.4)
WBC: 9.5 10*3/uL (ref 3.4–10.8)

## 2022-11-28 LAB — CMP14+EGFR
ALT: 18 IU/L (ref 0–44)
AST: 21 IU/L (ref 0–40)
Albumin/Globulin Ratio: 1.7 (ref 1.2–2.2)
Albumin: 4.3 g/dL (ref 3.8–4.8)
Alkaline Phosphatase: 112 IU/L (ref 44–121)
BUN/Creatinine Ratio: 17 (ref 10–24)
BUN: 15 mg/dL (ref 8–27)
Bilirubin Total: 0.3 mg/dL (ref 0.0–1.2)
CO2: 27 mmol/L (ref 20–29)
Calcium: 9.5 mg/dL (ref 8.6–10.2)
Chloride: 103 mmol/L (ref 96–106)
Creatinine, Ser: 0.88 mg/dL (ref 0.76–1.27)
Globulin, Total: 2.5 g/dL (ref 1.5–4.5)
Glucose: 101 mg/dL — ABNORMAL HIGH (ref 70–99)
Potassium: 4 mmol/L (ref 3.5–5.2)
Sodium: 145 mmol/L — ABNORMAL HIGH (ref 134–144)
Total Protein: 6.8 g/dL (ref 6.0–8.5)
eGFR: 89 mL/min/{1.73_m2} (ref >59)

## 2022-12-10 ENCOUNTER — Other Ambulatory Visit: Payer: Self-pay | Admitting: Family Medicine

## 2022-12-10 DIAGNOSIS — I480 Paroxysmal atrial fibrillation: Secondary | ICD-10-CM

## 2022-12-22 NOTE — Progress Notes (Signed)
LCS referral received. Patient is not a candidate for LCS due to age being over 69. Referral closed.

## 2023-01-05 DIAGNOSIS — J449 Chronic obstructive pulmonary disease, unspecified: Secondary | ICD-10-CM | POA: Diagnosis not present

## 2023-02-05 DIAGNOSIS — J449 Chronic obstructive pulmonary disease, unspecified: Secondary | ICD-10-CM | POA: Diagnosis not present

## 2023-02-18 DIAGNOSIS — I739 Peripheral vascular disease, unspecified: Secondary | ICD-10-CM | POA: Diagnosis not present

## 2023-02-18 DIAGNOSIS — E876 Hypokalemia: Secondary | ICD-10-CM | POA: Diagnosis not present

## 2023-02-18 DIAGNOSIS — M1990 Unspecified osteoarthritis, unspecified site: Secondary | ICD-10-CM | POA: Diagnosis not present

## 2023-02-18 DIAGNOSIS — Z8673 Personal history of transient ischemic attack (TIA), and cerebral infarction without residual deficits: Secondary | ICD-10-CM | POA: Diagnosis not present

## 2023-02-18 DIAGNOSIS — D6869 Other thrombophilia: Secondary | ICD-10-CM | POA: Diagnosis not present

## 2023-02-18 DIAGNOSIS — Z008 Encounter for other general examination: Secondary | ICD-10-CM | POA: Diagnosis not present

## 2023-02-18 DIAGNOSIS — E785 Hyperlipidemia, unspecified: Secondary | ICD-10-CM | POA: Diagnosis not present

## 2023-02-18 DIAGNOSIS — J449 Chronic obstructive pulmonary disease, unspecified: Secondary | ICD-10-CM | POA: Diagnosis not present

## 2023-02-18 DIAGNOSIS — I2510 Atherosclerotic heart disease of native coronary artery without angina pectoris: Secondary | ICD-10-CM | POA: Diagnosis not present

## 2023-02-18 DIAGNOSIS — I509 Heart failure, unspecified: Secondary | ICD-10-CM | POA: Diagnosis not present

## 2023-02-18 DIAGNOSIS — Z8249 Family history of ischemic heart disease and other diseases of the circulatory system: Secondary | ICD-10-CM | POA: Diagnosis not present

## 2023-02-18 DIAGNOSIS — N529 Male erectile dysfunction, unspecified: Secondary | ICD-10-CM | POA: Diagnosis not present

## 2023-02-18 DIAGNOSIS — I252 Old myocardial infarction: Secondary | ICD-10-CM | POA: Diagnosis not present

## 2023-03-01 ENCOUNTER — Ambulatory Visit: Payer: Medicare HMO | Admitting: Family Medicine

## 2023-03-01 ENCOUNTER — Encounter: Payer: Self-pay | Admitting: Family Medicine

## 2023-03-01 VITALS — BP 104/61 | HR 64 | Ht 69.0 in | Wt 130.0 lb

## 2023-03-01 DIAGNOSIS — E1159 Type 2 diabetes mellitus with other circulatory complications: Secondary | ICD-10-CM | POA: Diagnosis not present

## 2023-03-01 DIAGNOSIS — E1169 Type 2 diabetes mellitus with other specified complication: Secondary | ICD-10-CM

## 2023-03-01 DIAGNOSIS — I110 Hypertensive heart disease with heart failure: Secondary | ICD-10-CM | POA: Diagnosis not present

## 2023-03-01 DIAGNOSIS — I10 Essential (primary) hypertension: Secondary | ICD-10-CM

## 2023-03-01 DIAGNOSIS — I5032 Chronic diastolic (congestive) heart failure: Secondary | ICD-10-CM

## 2023-03-01 DIAGNOSIS — I2510 Atherosclerotic heart disease of native coronary artery without angina pectoris: Secondary | ICD-10-CM

## 2023-03-01 DIAGNOSIS — E785 Hyperlipidemia, unspecified: Secondary | ICD-10-CM

## 2023-03-01 DIAGNOSIS — I480 Paroxysmal atrial fibrillation: Secondary | ICD-10-CM

## 2023-03-01 DIAGNOSIS — J449 Chronic obstructive pulmonary disease, unspecified: Secondary | ICD-10-CM

## 2023-03-01 DIAGNOSIS — F17210 Nicotine dependence, cigarettes, uncomplicated: Secondary | ICD-10-CM

## 2023-03-01 DIAGNOSIS — R69 Illness, unspecified: Secondary | ICD-10-CM | POA: Diagnosis not present

## 2023-03-01 LAB — BAYER DCA HB A1C WAIVED: HB A1C (BAYER DCA - WAIVED): 5.9 % — ABNORMAL HIGH (ref 4.8–5.6)

## 2023-03-01 MED ORDER — APIXABAN 5 MG PO TABS: 5.0000 mg | ORAL_TABLET | Freq: Two times a day (BID) | ORAL | 3 refills | Status: DC

## 2023-03-01 NOTE — Progress Notes (Signed)
BP 104/61   Pulse 64   Ht 5\' 9"  (1.753 m)   Wt 130 lb (59 kg)   SpO2 95%   BMI 19.20 kg/m    Subjective:   [name], male    DOB: 01/16/1946, 77 y.o.   MRN: [id number]  HPI: Luis Salinas is a 77 y.o. male presenting on 03/01/2023 for Medical Management of Chronic Issues, Diabetes, Hyperlipidemia, and Congestive Heart Failure   HPI Type 2 diabetes mellitus Patient comes in today for recheck of his diabetes. Patient has been currently taking Jardiance. Patient is not currently on an ACE inhibitor/ARB. Patient has not seen an ophthalmologist this year. Patient denies any issues with their feet. The symptom started onset as an adult hypertension and CHF and hyperlipidemia and A-fib ARE RELATED TO DM   Hypertension and CHF and A-fib Patient is currently on furosemide and Bystolic and Eliquis, and their blood pressure today is 104/61. Patient denies any lightheadedness or dizziness. Patient denies headaches, blurred vision, chest pains, shortness of breath, or weakness. Denies any side effects from medication and is content with current medication.   COPD Patient is coming in for COPD recheck today.  He is currently on Breztri.  He has a mild chronic cough but denies any major coughing spells or wheezing spells.  He has 2 nighttime symptoms per week and 2 daytime symptoms per week currently.  Patient is still smoking about a pack per day and has no desire to quit  Hyperlipidemia Patient is coming in for recheck of his hyperlipidemia. The patient is currently taking Crestor. They deny any issues with myalgias or history of liver damage from it. They deny any focal numbness or weakness or chest pain.   Relevant past medical, surgical, family and social history reviewed and updated as indicated. Interim medical history since our last visit reviewed. Allergies and medications reviewed and updated.  Review of Systems  Constitutional:  Positive for unexpected weight change.  Negative for chills and fever.  Eyes:  Negative for visual disturbance.  Respiratory:  Negative for shortness of breath and wheezing.   Cardiovascular:  Negative for chest pain and leg swelling.  Musculoskeletal:  Negative for back pain and gait problem.  Skin:  Negative for rash.  Neurological:  Negative for dizziness, weakness and light-headedness.  All other systems reviewed and are negative.   Per HPI unless specifically indicated above   Allergies as of 03/01/2023   No Known Allergies      Medication List        Accurate as of March 01, 2023 11:19 AM. If you have any questions, ask your nurse or doctor.          Accu-Chek Guide test strip Generic drug: glucose blood USE TO CHECK SUGAR UP TO 4 TIMES DAILY AS DIRECTED   Accu-Chek Softclix Lancets lancets SMARTSIG:Topical 1-4 Times Daily   Albuterol Sulfate 108 (90 Base) MCG/ACT Aepb Commonly known as: PROAIR RESPICLICK Inhale 2 puffs into the lungs every 6 (six) hours as needed (shortness of breath/ wheeze).   apixaban 5 MG Tabs tablet Commonly known as: Eliquis Take 1 tablet (5 mg total) by mouth 2 (two) times daily.   b complex vitamins capsule Take 1 capsule by mouth daily.   blood glucose meter kit and supplies Kit Dispense based on patient and insurance preference. Use up to four times daily as directed.   Breztri Aerosphere 160-9-4.8 MCG/ACT Aero Generic drug: Budeson-Glycopyrrol-Formoterol Inhale 2 puffs into the lungs  2 (two) times daily.   [facility] Aspirin Low Dose 81 MG tablet Generic drug: aspirin EC TAKE 1 TABLET (81 MG TOTAL) BY MOUTH DAILY. SWALLOW WHOLE.   diphenhydramine-acetaminophen 25-500 MG Tabs tablet Commonly known as: TYLENOL PM Take 1 tablet by mouth at bedtime as needed (sleep/pain.).   empagliflozin 10 MG Tabs tablet Commonly known as: Jardiance Take 1 tablet (10 mg total) by mouth daily.   furosemide 40 MG tablet Commonly known as: LASIX Take 1 tablet (40 mg total) by mouth  daily.   nebivolol 10 MG tablet Commonly known as: Bystolic Take 1 tablet (10 mg total) by mouth daily.   nebivolol 5 MG tablet Commonly known as: Bystolic Take 1 tablet (5 mg total) by mouth daily. With 10 mg tablet to equal 15 mg daily   nitroGLYCERIN 0.4 MG SL tablet Commonly known as: NITROSTAT Place 1 tablet (0.4 mg total) under the tongue every 5 (five) minutes as needed for chest pain.   OXYGEN Inhale 4 L into the lungs daily.   potassium chloride SA 20 MEQ tablet Commonly known as: Klor-Con M20 Take 1 tablet (20 mEq total) by mouth daily.   PRESERVISION AREDS 2 PO Take 1 tablet by mouth daily.   rosuvastatin 40 MG tablet Commonly known as: CRESTOR Take 1 tablet (40 mg total) by mouth daily.   Salonpas Pain Relief Patch Ptch Place 1 patch onto the skin daily as needed (pain.).         Objective:   BP 104/61   Pulse 64   Ht 5\' 9"  (1.753 m)   Wt 130 lb (59 kg)   SpO2 95%   BMI 19.20 kg/m   Wt Readings from Last 3 Encounters:  03/01/23 130 lb (59 kg)  11/27/22 134 lb (60.8 kg)  06/05/22 134 lb (60.8 kg)    Physical Exam Vitals and nursing note reviewed.  Constitutional:      General: He is not in acute distress.    Appearance: He is well-developed. He is not diaphoretic.  Eyes:     General: No scleral icterus.    Conjunctiva/sclera: Conjunctivae normal.  Neck:     Thyroid: No thyromegaly.  Cardiovascular:     Rate and Rhythm: Normal rate and regular rhythm.     Heart sounds: Normal heart sounds. No murmur heard. Pulmonary:     Effort: Pulmonary effort is normal. No respiratory distress.     Breath sounds: Normal breath sounds. No wheezing.  Musculoskeletal:        General: No swelling. Normal range of motion.     Cervical back: Neck supple.  Lymphadenopathy:     Cervical: No cervical adenopathy.  Skin:    General: Skin is warm and dry.     Findings: No rash.  Neurological:     Mental Status: He is alert and oriented to person, place, and  time.     Coordination: Coordination normal.  Psychiatric:        Behavior: Behavior normal.       Assessment & Plan:   Problem List Items Addressed This Visit       Cardiovascular and Mediastinum   Hypertension   Relevant Medications   apixaban (ELIQUIS) 5 MG TABS tablet   CAD in native artery   Relevant Medications   apixaban (ELIQUIS) 5 MG TABS tablet   Chronic diastolic CHF (congestive heart failure) (HCC)   Relevant Medications   apixaban (ELIQUIS) 5 MG TABS tablet     Respiratory   COPD (  chronic obstructive pulmonary disease) (HCC)     Endocrine   Type 2 diabetes mellitus (HCC) - Primary   Relevant Orders   Bayer DCA Hb A1c Waived     Other   Dyslipidemia   Other Visit Diagnoses     AF (paroxysmal atrial fibrillation) (HCC)       Relevant Medications   apixaban (ELIQUIS) 5 MG TABS tablet   Smokes less than 1 pack a day with greater than 30 pack year history       Relevant Orders   CT CHEST LUNG CA SCREEN LOW DOSE W/O CM     A1c is 5.9 looks good  It does not look like he ever got the CT cancer screening, will order that CT again, especially with weight loss and history of smoking.  Refill medications.  No changes. Follow up plan: Return in about 3 months (around 06/01/2023), or if symptoms worsen or fail to improve, for Diabetes and hypertension recheck.  Counseling provided for all of the vaccine components Orders Placed This Encounter  Procedures   CT CHEST LUNG CA SCREEN LOW DOSE W/O CM   Bayer DCA Hb A1c Waived    Caryl Pina, MD [facility] 03/01/2023, 11:19 AM

## 2023-03-06 DIAGNOSIS — J449 Chronic obstructive pulmonary disease, unspecified: Secondary | ICD-10-CM | POA: Diagnosis not present

## 2023-03-07 ENCOUNTER — Other Ambulatory Visit: Payer: Self-pay | Admitting: Family Medicine

## 2023-03-07 DIAGNOSIS — I5043 Acute on chronic combined systolic (congestive) and diastolic (congestive) heart failure: Secondary | ICD-10-CM

## 2023-03-07 DIAGNOSIS — R7303 Prediabetes: Secondary | ICD-10-CM

## 2023-04-06 DIAGNOSIS — J449 Chronic obstructive pulmonary disease, unspecified: Secondary | ICD-10-CM | POA: Diagnosis not present

## 2023-05-06 DIAGNOSIS — J449 Chronic obstructive pulmonary disease, unspecified: Secondary | ICD-10-CM | POA: Diagnosis not present

## 2023-06-02 ENCOUNTER — Other Ambulatory Visit: Payer: Self-pay | Admitting: Family Medicine

## 2023-06-02 ENCOUNTER — Encounter: Payer: Self-pay | Admitting: Family Medicine

## 2023-06-02 ENCOUNTER — Ambulatory Visit (INDEPENDENT_AMBULATORY_CARE_PROVIDER_SITE_OTHER): Payer: Medicare HMO | Admitting: Family Medicine

## 2023-06-02 VITALS — BP 97/57 | HR 67 | Ht 69.0 in | Wt 126.0 lb

## 2023-06-02 DIAGNOSIS — I5042 Chronic combined systolic (congestive) and diastolic (congestive) heart failure: Secondary | ICD-10-CM | POA: Diagnosis not present

## 2023-06-02 DIAGNOSIS — E1169 Type 2 diabetes mellitus with other specified complication: Secondary | ICD-10-CM | POA: Diagnosis not present

## 2023-06-02 DIAGNOSIS — E1159 Type 2 diabetes mellitus with other circulatory complications: Secondary | ICD-10-CM

## 2023-06-02 DIAGNOSIS — J449 Chronic obstructive pulmonary disease, unspecified: Secondary | ICD-10-CM | POA: Diagnosis not present

## 2023-06-02 DIAGNOSIS — E785 Hyperlipidemia, unspecified: Secondary | ICD-10-CM | POA: Diagnosis not present

## 2023-06-02 DIAGNOSIS — I110 Hypertensive heart disease with heart failure: Secondary | ICD-10-CM | POA: Diagnosis not present

## 2023-06-02 DIAGNOSIS — I10 Essential (primary) hypertension: Secondary | ICD-10-CM

## 2023-06-02 LAB — CMP14+EGFR
ALT: 22 IU/L (ref 0–44)
AST: 23 IU/L (ref 0–40)
Albumin: 4.2 g/dL (ref 3.8–4.8)
Alkaline Phosphatase: 112 IU/L (ref 44–121)
BUN/Creatinine Ratio: 14 (ref 10–24)
BUN: 14 mg/dL (ref 8–27)
Bilirubin Total: 0.3 mg/dL (ref 0.0–1.2)
CO2: 28 mmol/L (ref 20–29)
Calcium: 9.5 mg/dL (ref 8.6–10.2)
Chloride: 100 mmol/L (ref 96–106)
Creatinine, Ser: 1.01 mg/dL (ref 0.76–1.27)
Globulin, Total: 2.4 g/dL (ref 1.5–4.5)
Glucose: 89 mg/dL (ref 70–99)
Potassium: 4.3 mmol/L (ref 3.5–5.2)
Sodium: 141 mmol/L (ref 134–144)
Total Protein: 6.6 g/dL (ref 6.0–8.5)
eGFR: 77 mL/min/{1.73_m2} (ref >59)

## 2023-06-02 LAB — LIPID PANEL
Chol/HDL Ratio: 2.5 ratio (ref 0.0–5.0)
Cholesterol, Total: 107 mg/dL (ref 100–199)
HDL: 42 mg/dL (ref >39)
LDL Chol Calc (NIH): 38 mg/dL (ref 0–99)
Triglycerides: 158 mg/dL — ABNORMAL HIGH (ref 0–149)
VLDL Cholesterol Cal: 27 mg/dL (ref 5–40)

## 2023-06-02 LAB — CBC WITH DIFFERENTIAL/PLATELET
Basophils Absolute: 0.1 10*3/uL (ref 0.0–0.2)
Basos: 1 %
EOS (ABSOLUTE): 0.5 10*3/uL — ABNORMAL HIGH (ref 0.0–0.4)
Eos: 4 %
Hematocrit: 46.9 % (ref 37.5–51.0)
Hemoglobin: 15.4 g/dL (ref 13.0–17.7)
Immature Grans (Abs): 0.1 10*3/uL (ref 0.0–0.1)
Immature Granulocytes: 1 %
Lymphocytes Absolute: 3.1 10*3/uL (ref 0.7–3.1)
Lymphs: 27 %
MCH: 29.4 pg (ref 26.6–33.0)
MCHC: 32.8 g/dL (ref 31.5–35.7)
MCV: 90 fL (ref 79–97)
Monocytes Absolute: 0.8 10*3/uL (ref 0.1–0.9)
Monocytes: 7 %
Neutrophils Absolute: 7.1 10*3/uL — ABNORMAL HIGH (ref 1.4–7.0)
Neutrophils: 60 %
Platelets: 184 10*3/uL (ref 150–450)
RBC: 5.24 x10E6/uL (ref 4.14–5.80)
RDW: 13.2 % (ref 11.6–15.4)
WBC: 11.6 10*3/uL — ABNORMAL HIGH (ref 3.4–10.8)

## 2023-06-02 LAB — BAYER DCA HB A1C WAIVED: HB A1C (BAYER DCA - WAIVED): 5.9 % — ABNORMAL HIGH (ref 4.8–5.6)

## 2023-06-02 MED ORDER — ASPIRIN 81 MG PO TBEC: 81.0000 mg | DELAYED_RELEASE_TABLET | Freq: Every day | ORAL | 1 refills | Status: DC

## 2023-06-02 MED ORDER — EMPAGLIFLOZIN 10 MG PO TABS: 10.0000 mg | ORAL_TABLET | Freq: Every day | ORAL | 0 refills | Status: DC

## 2023-06-02 NOTE — Telephone Encounter (Signed)
I believe the aspirin is supposed to be once daily but it has been documented multiple times in the past that he takes 5 daily, so please call the patient and ask him for sure if he is taking once a day or 5 times a day

## 2023-06-02 NOTE — Telephone Encounter (Signed)
[facility] ASPIRIN LOW DOSE 81 MG tablet        Changed from: aspirin EC ([facility] ASPIRIN LOW DOSE) 81 MG tablet    Possible duplicate: Hover to review recent actions on this medication   Sig: TAKE 1 TABLET (81 MG TOTAL) BY MOUTH 5 (FIVE) TIMES DAILY. SWALLOW WHOLE.   Pharmacy comment: Script Clarification:SHOULD THIS BE ONCE DAILY.

## 2023-06-02 NOTE — Progress Notes (Signed)
BP (!) 97/57   Pulse 67   Ht 5\' 9"  (1.753 m)   Wt 126 lb (57.2 kg)   SpO2 92%   BMI 18.61 kg/m    Subjective:   [name], male    DOB: Nov 15, 1946, 77 y.o.   MRN: [id number]  HPI: Luis Salinas is a 77 y.o. male presenting on 06/02/2023 for Medical Management of Chronic Issues and Diabetes   HPI Type 2 diabetes mellitus Patient comes in today for recheck of his diabetes. Patient has been currently taking Jardiance. Patient is not currently on an ACE inhibitor/ARB. Patient has not seen an ophthalmologist this year. Patient denies any new issues with their feet. The symptom started onset as an adult hypertension and hyperlipidemia ARE RELATED TO DM   Hypertension Patient is currently on Jardiance and furosemide and Bystolic, and their blood pressure today is 97/57. Patient denies any lightheadedness or dizziness. Patient denies headaches, blurred vision, chest pains, shortness of breath, or weakness. Denies any side effects from medication and is content with current medication.   Hyperlipidemia Patient is coming in for recheck of his hyperlipidemia. The patient is currently taking Crestor. They deny any issues with myalgias or history of liver damage from it. They deny any focal numbness or weakness or chest pain.   COPD Patient is coming in for COPD recheck today.  He is currently on Breztri although he admits he is not using as consistently as he should.  He has a mild chronic cough but denies any major coughing spells or wheezing spells.  He has 5 nighttime symptoms per week and 5 daytime symptoms per week currently.   Relevant past medical, surgical, family and social history reviewed and updated as indicated. Interim medical history since our last visit reviewed. Allergies and medications reviewed and updated.  Review of Systems  Constitutional:  Negative for chills and fever.  HENT:  Negative for congestion, ear discharge, ear pain, postnasal drip,  rhinorrhea, sinus pressure, sneezing, sore throat and voice change.   Eyes:  Negative for pain, discharge, redness and visual disturbance.  Respiratory:  Positive for cough and wheezing. Negative for shortness of breath.   Cardiovascular:  Negative for chest pain, palpitations and leg swelling.  Musculoskeletal:  Negative for back pain and gait problem.  Skin:  Negative for rash.  All other systems reviewed and are negative.   Per HPI unless specifically indicated above   Allergies as of 06/02/2023   No Known Allergies      Medication List        Accurate as of June 02, 2023 12:03 PM. If you have any questions, ask your nurse or doctor.          Accu-Chek Guide test strip Generic drug: glucose blood USE TO CHECK SUGAR UP TO 4 TIMES DAILY AS DIRECTED   Accu-Chek Softclix Lancets lancets SMARTSIG:Topical 1-4 Times Daily   Albuterol Sulfate 108 (90 Base) MCG/ACT Aepb Commonly known as: PROAIR RESPICLICK Inhale 2 puffs into the lungs every 6 (six) hours as needed (shortness of breath/ wheeze).   apixaban 5 MG Tabs tablet Commonly known as: Eliquis Take 1 tablet (5 mg total) by mouth 2 (two) times daily.   aspirin EC 81 MG tablet Commonly known as: [facility] Aspirin Low Dose Take 1 tablet (81 mg total) by mouth 5 (five) times daily. SWALLOW WHOLE.   b complex vitamins capsule Take 1 capsule by mouth daily.   blood glucose meter kit and supplies Kit  Dispense based on patient and insurance preference. Use up to four times daily as directed.   Breztri Aerosphere 160-9-4.8 MCG/ACT Aero Generic drug: Budeson-Glycopyrrol-Formoterol Inhale 2 puffs into the lungs 2 (two) times daily.   diphenhydramine-acetaminophen 25-500 MG Tabs tablet Commonly known as: TYLENOL PM Take 1 tablet by mouth at bedtime as needed (sleep/pain.).   empagliflozin 10 MG Tabs tablet Commonly known as: Jardiance Take 1 tablet (10 mg total) by mouth daily.   furosemide 40 MG tablet Commonly known  as: LASIX Take 1 tablet (40 mg total) by mouth daily.   nebivolol 10 MG tablet Commonly known as: Bystolic Take 1 tablet (10 mg total) by mouth daily.   nebivolol 5 MG tablet Commonly known as: Bystolic Take 1 tablet (5 mg total) by mouth daily. With 10 mg tablet to equal 15 mg daily   nitroGLYCERIN 0.4 MG SL tablet Commonly known as: NITROSTAT Place 1 tablet (0.4 mg total) under the tongue every 5 (five) minutes as needed for chest pain.   OXYGEN Inhale 4 L into the lungs daily.   potassium chloride SA 20 MEQ tablet Commonly known as: Klor-Con M20 Take 1 tablet (20 mEq total) by mouth daily.   PRESERVISION AREDS 2 PO Take 1 tablet by mouth daily.   rosuvastatin 40 MG tablet Commonly known as: CRESTOR Take 1 tablet (40 mg total) by mouth daily.   Salonpas Pain Relief Patch Ptch Place 1 patch onto the skin daily as needed (pain.).         Objective:   BP (!) 97/57   Pulse 67   Ht 5\' 9"  (1.753 m)   Wt 126 lb (57.2 kg)   SpO2 92%   BMI 18.61 kg/m   Wt Readings from Last 3 Encounters:  06/02/23 126 lb (57.2 kg)  03/01/23 130 lb (59 kg)  11/27/22 134 lb (60.8 kg)    Physical Exam Vitals and nursing note reviewed.  Constitutional:      General: He is not in acute distress.    Appearance: He is well-developed. He is not diaphoretic.  Eyes:     General: No scleral icterus.    Conjunctiva/sclera: Conjunctivae normal.  Neck:     Thyroid: No thyromegaly.  Cardiovascular:     Rate and Rhythm: Normal rate. Rhythm irregular.     Heart sounds: Normal heart sounds. No murmur heard. Pulmonary:     Effort: Pulmonary effort is normal. No respiratory distress.     Breath sounds: Normal breath sounds. No wheezing, rhonchi or rales.  Chest:     Chest wall: No tenderness.  Musculoskeletal:        General: No swelling. Normal range of motion.     Cervical back: Neck supple.  Lymphadenopathy:     Cervical: No cervical adenopathy.  Skin:    General: Skin is warm and  dry.     Findings: No rash.  Neurological:     Mental Status: He is alert and oriented to person, place, and time.     Coordination: Coordination normal.  Psychiatric:        Behavior: Behavior normal.       Assessment & Plan:   Problem List Items Addressed This Visit       Cardiovascular and Mediastinum   Hypertension   Relevant Medications   aspirin EC ([facility]S ASPIRIN LOW DOSE) 81 MG tablet     Respiratory   COPD (chronic obstructive pulmonary disease) (HCC)     Endocrine   Type 2 diabetes mellitus (  HCC) - Primary   Relevant Medications   aspirin EC ([facility]S ASPIRIN LOW DOSE) 81 MG tablet   empagliflozin (JARDIANCE) 10 MG TABS tablet   Other Relevant Orders   CBC with Differential/Platelet   CMP14+EGFR   Lipid panel   Bayer DCA Hb A1c Waived     Other   Dyslipidemia   Other Visit Diagnoses     Chronic combined systolic and diastolic congestive heart failure (HCC)       Relevant Medications   aspirin EC ([facility]S ASPIRIN LOW DOSE) 81 MG tablet   empagliflozin (JARDIANCE) 10 MG TABS tablet       Breathing slightly worsened, recommended that he be more consistent about his breztri and I think that should help, if it does not then let us know.  Will check blood work today.  Blood pressure down but he denies any symptoms from it.  A1c was 5.9. Follow up plan: Return in about 3 months (around 09/02/2023), or if symptoms worsen or fail to improve, for dm and htn recheck.  Counseling provided for all of the vaccine components Orders Placed This Encounter  Procedures   CBC with Differential/Platelet   CMP14+EGFR   Lipid panel   Bayer DCA Hb A1c Waived    Arville Care, MD [facility] 06/02/2023, 12:03 PM

## 2023-06-03 ENCOUNTER — Other Ambulatory Visit: Payer: Self-pay | Admitting: Family Medicine

## 2023-06-03 ENCOUNTER — Telehealth: Payer: Self-pay | Admitting: Family Medicine

## 2023-06-03 MED ORDER — ALBUTEROL SULFATE 108 (90 BASE) MCG/ACT IN AEPB: 2.0000 | INHALATION_SPRAY | Freq: Four times a day (QID) | RESPIRATORY_TRACT | 0 refills | Status: DC | PRN

## 2023-06-03 NOTE — Telephone Encounter (Signed)
RX sent- wife aware

## 2023-06-04 ENCOUNTER — Other Ambulatory Visit: Payer: Self-pay | Admitting: Family Medicine

## 2023-06-04 NOTE — Telephone Encounter (Signed)
TC to pt he is taking it 5x a day

## 2023-06-04 NOTE — Telephone Encounter (Signed)
Pts wife called stating that the Albuterol Rx that was sent in for pt is not covered by the insurance. Was told by the pharmacy that the regular Albuterol is covered. Unsure of what the difference is but wants to know if that one can be sent in for pt instead.  Also says that Aspirin was sent in to pharmacy for pt and has directions for pt to take 5x daily but says pt is already on 2 other blood thinners so she doesn't think he needs to take the aspirin that many times each day.   Please advise and call wife.

## 2023-06-04 NOTE — Telephone Encounter (Signed)
albuterol (VENTOLIN HFA) 108 (90 Base) MCG/ACT inhaler        Changed from: Albuterol Sulfate (PROAIR RESPICLICK) 108 (90 Base) MCG/ACT AEPB    Pharmacy comment: Alternative Requested:PRIOR AUTH REQUIRED.   All Pharmacy Suggested Alternatives:  albuterol (VENTOLIN HFA) 108 (90 Base) MCG/ACT inhaler levalbuterol (XOPENEX HFA) 45 MCG/ACT inhaler albuterol (VENTOLIN HFA) 108 (90 Base) MCG/ACT inhaler

## 2023-06-06 DIAGNOSIS — J449 Chronic obstructive pulmonary disease, unspecified: Secondary | ICD-10-CM | POA: Diagnosis not present

## 2023-06-08 ENCOUNTER — Ambulatory Visit: Payer: Medicare HMO

## 2023-06-08 VITALS — Ht 67.0 in | Wt 125.0 lb

## 2023-06-08 DIAGNOSIS — Z0000 Encounter for general adult medical examination without abnormal findings: Secondary | ICD-10-CM | POA: Diagnosis not present

## 2023-06-08 NOTE — Progress Notes (Signed)
Subjective:   Luis Salinas is a 77 y.o. male who presents for Medicare Annual/Subsequent preventive examination.  Visit Complete: Virtual  I connected with  Deanna Artis Huynh on 06/08/23 by a audio enabled telemedicine application and verified that I am speaking with the correct person using two identifiers.  Patient Location: Home  Provider Location: Home Office  I discussed the limitations of evaluation and management by telemedicine. The patient expressed understanding and agreed to proceed.  Patient Medicare AWV questionnaire was completed by the patient on 06/08/2023; I have confirmed that all information answered by patient is correct and no changes since this date.  Review of Systems     Cardiac Risk Factors include: advanced age (>79men, >59 women);male gender;dyslipidemia;hypertension;smoking/ tobacco exposure     Objective:    Today's Vitals   06/08/23 0847  Weight: 125 lb (56.7 kg)  Height: 5\' 7"  (1.702 m)   Body mass index is 19.58 kg/m.     06/08/2023    8:51 AM 06/05/2022    9:10 AM 11/10/2021    1:35 PM 09/20/2021    4:00 AM 09/19/2021    5:41 PM 06/04/2021    9:42 AM 06/26/2019    3:04 PM  Advanced Directives  Does Patient Have a Medical Advance Directive? Yes Yes No No No Yes No  Type of Estate agent of [place];Living will Healthcare Power of [place];Living will    Healthcare Power of [place];Living will   Copy of Healthcare Power of Attorney in Chart? No - copy requested No - copy requested    No - copy requested   Would patient like information on creating a medical advance directive?    No - Patient declined No - Patient declined  No - Patient declined    Current Medications (verified) Outpatient Encounter Medications as of 06/08/2023  Medication Sig   ACCU-CHEK GUIDE test strip USE TO CHECK SUGAR UP TO 4 TIMES DAILY AS DIRECTED   Accu-Chek Softclix Lancets lancets SMARTSIG:Topical 1-4 Times Daily   albuterol (VENTOLIN HFA)  108 (90 Base) MCG/ACT inhaler Inhale 2 puffs into the lungs every 6 (six) hours as needed for wheezing or shortness of breath.   apixaban (ELIQUIS) 5 MG TABS tablet Take 1 tablet (5 mg total) by mouth 2 (two) times daily.   b complex vitamins capsule Take 1 capsule by mouth daily.   blood glucose meter kit and supplies KIT Dispense based on patient and insurance preference. Use up to four times daily as directed.   Budeson-Glycopyrrol-Formoterol (BREZTRI AEROSPHERE) 160-9-4.8 MCG/ACT AERO Inhale 2 puffs into the lungs 2 (two) times daily.   [facility] ASPIRIN LOW DOSE 81 MG tablet TAKE 1 TABLET (81 MG TOTAL) BY MOUTH 5 (FIVE) TIMES DAILY. SWALLOW WHOLE.   diphenhydramine-acetaminophen (TYLENOL PM) 25-500 MG TABS tablet Take 1 tablet by mouth at bedtime as needed (sleep/pain.).   empagliflozin (JARDIANCE) 10 MG TABS tablet Take 1 tablet (10 mg total) by mouth daily.   furosemide (LASIX) 40 MG tablet Take 1 tablet (40 mg total) by mouth daily.   Menthol-Methyl Salicylate (SALONPAS PAIN RELIEF PATCH) PTCH Place 1 patch onto the skin daily as needed (pain.).   Multiple Vitamins-Minerals (PRESERVISION AREDS 2 PO) Take 1 tablet by mouth daily.   nebivolol (BYSTOLIC) 10 MG tablet Take 1 tablet (10 mg total) by mouth daily.   nebivolol (BYSTOLIC) 5 MG tablet Take 1 tablet (5 mg total) by mouth daily. With 10 mg tablet to equal 15 mg daily   nitroGLYCERIN (  NITROSTAT) 0.4 MG SL tablet Place 1 tablet (0.4 mg total) under the tongue every 5 (five) minutes as needed for chest pain.   OXYGEN Inhale 4 L into the lungs daily.   potassium chloride SA (KLOR-CON M20) 20 MEQ tablet Take 1 tablet (20 mEq total) by mouth daily.   rosuvastatin (CRESTOR) 40 MG tablet Take 1 tablet (40 mg total) by mouth daily.   No facility-administered encounter medications on file as of 06/08/2023.    Allergies (verified) Patient has no known allergies.   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 His family found him after 27 years   Arthritis    Cataract    Chronic diastolic CHF (congestive heart failure) (HCC)    COPD (chronic obstructive pulmonary disease) (HCC)    Dyslipidemia    History of concussion    History of STEMI    a. 11/2007 s/p DES to LCX at [facility]   Hypertension    Paroxysmal atrial fibrillation (HCC)    Pneumonia    exposure to caustic chemicals   Stroke [facility])    multiple strokes   Tobacco abuse    Type 2 diabetes mellitus (HCC)    Past Surgical History:  Procedure Laterality Date   ANTERIOR CERVICAL DECOMP/DISCECTOMY FUSION  05/16/2012   Procedure: ANTERIOR CERVICAL DECOMPRESSION/DISCECTOMY FUSION 2 LEVELS;  Surgeon: Barnett Abu, MD;  Location: [facility] NEURO ORS;  Service: Neurosurgery;  Laterality: N/A;  Cervical Five-Six, Cervical Six-Seven Anterior Cervical Decompression/Diskectomy Fusion   CARDIAC CATHETERIZATION     CARDIOVERSION N/A 11/10/2021   Procedure: CARDIOVERSION;  Surgeon: Wendall Stade, MD;  Location[facility] ENDOSCOPY;  Service: Cardiovascular;  Laterality: N/A;   COLONOSCOPY W/ POLYPECTOMY     CORONARY ANGIOPLASTY     EYE SURGERY     cataracts   JOINT REPLACEMENT     Left knee replacement   JOINT REPLACEMENT     left elbow at age 39   ROTATOR CUFF REPAIR     left   TONSILLECTOMY     Family History  Problem Relation Age of Onset   Heart disease Mother    COPD Father    Aneurysm Father    Anesthesia problems Neg Hx    Hypotension Neg Hx    Malignant hyperthermia Neg Hx    Pseudochol deficiency Neg Hx    Social History   Socioeconomic History   Marital status: Married    Spouse name: Zella Ball   Number of children: 7   Years of education: college   Highest education level: Some college, no degree  Occupational History   Occupation: Retired    Comment: Proofreader  Tobacco Use   Smoking status: Light Smoker    Types: Cigars    Last attempt to quit: 04/03/2013    Years since quitting: 10.1   Smokeless  tobacco: Never   Tobacco comments:    1-2 cigars per day.  Smoked 40 years cigarettes.  10 years ago quit cigarettes.  Vaping Use   Vaping Use: Never used  Substance and Sexual Activity   Alcohol use: No    Alcohol/week: 0.0 standard drinks of alcohol   Drug use: Yes    Types: Marijuana    Comment: offer he will smoke 01/20/22   Sexual activity: Yes  Other Topics Concern   Not on file  Social History Narrative   Lives with his fourth wife.    Says  his family found him in 1992 and he had no recollection of his childhood -believes to have had several head injuries    Social Determinants of Health   Financial Resource Strain: Low Risk  (06/08/2023)   Overall Financial Resource Strain (CARDIA)    Difficulty of Paying Living Expenses: Not hard at all  Food Insecurity: No Food Insecurity (06/08/2023)   Hunger Vital Sign    Worried About Running Out of Food in the Last Year: Never true    Ran Out of Food in the Last Year: Never true  Transportation Needs: No Transportation Needs (06/08/2023)   PRAPARE - Administrator, Civil Service (Medical): No    Lack of Transportation (Non-Medical): No  Physical Activity: Insufficiently Active (06/08/2023)   Exercise Vital Sign    Days of Exercise per Week: 2 days    Minutes of Exercise per Session: 20 min  Stress: No Stress Concern Present (06/08/2023)   [employer] of Occupational Health - Occupational Stress Questionnaire    Feeling of Stress : Not at all  Social Connections: Socially Integrated (06/08/2023)   Social Connection and Isolation Panel [NHANES]    Frequency of Communication with Friends and Family: More than three times a week    Frequency of Social Gatherings with Friends and Family: More than three times a week    Attends Religious Services: 1 to 4 times per year    Active Member of [employer] or Organizations: Yes    Attends Engineer, structural: More than 4 times per year    Marital Status: Married     Tobacco Counseling Ready to quit: No Counseling given: Not Answered Tobacco comments: 1-2 cigars per day.  Smoked 40 years cigarettes.  10 years ago quit cigarettes.   Clinical Intake:  Pre-visit preparation completed: Yes  Pain : No/denies pain     Nutritional Risks: None Diabetes: No  How often do you need to have someone help you when you read instructions, pamphlets, or other written materials from your doctor or pharmacy?: 1 - Never  Interpreter Needed?: No  Information entered by :: Renie Ora, LPN   Activities of Daily Living    06/08/2023    8:51 AM  In your present state of health, do you have any difficulty performing the following activities:  Hearing? 0  Vision? 0  Difficulty concentrating or making decisions? 0  Walking or climbing stairs? 0  Dressing or bathing? 0  Doing errands, shopping? 0  Preparing Food and eating ? N  Using the Toilet? N  In the past six months, have you accidently leaked urine? N  Do you have problems with loss of bowel control? N  Managing your Medications? N  Managing your Finances? N  Housekeeping or managing your Housekeeping? N    Patient Care Team: Dettinger, Elige Radon, MD as PCP - General (Family Medicine) Little Ishikawa, MD as PCP - Cardiology (Cardiology) Micki Riley, MD as Consulting Physician (Neurology) Iva Boop, MD as Consulting Physician (Gastroenterology) Dr [facility], OD (Optometry)  Indicate any recent Medical Services you may have received from other than [facility] providers in the past year (date may be approximate).     Assessment:   This is a routine wellness examination for [place].  Hearing/Vision screen Vision Screening - Comments:: Wears rx glasses - up to date with routine eye exams with  Dr.Davis   Dietary issues and exercise activities discussed:     Goals Addressed  This Visit's Progress    DIET - INCREASE WATER INTAKE   On  track    Try to drink 6-8 glasses of water daily.       Depression Screen    06/08/2023    8:50 AM 06/02/2023   11:21 AM 03/01/2023   10:51 AM 11/27/2022    8:22 AM 06/05/2022    9:08 AM 05/27/2022    8:12 AM 11/26/2021    8:22 AM  PHQ 2/9 Scores  PHQ - 2 Score 0 0 0 0 0 0 0  PHQ- 9 Score 0 3 3 3  0 0 0    Fall Risk    06/08/2023    8:48 AM 06/02/2023   11:21 AM 03/01/2023   10:51 AM 11/27/2022    8:22 AM 06/05/2022    9:05 AM  Fall Risk   Falls in the past year? 0 0 0 0 0  Number falls in past yr: 0    0  Injury with Fall? 0    0  Risk for fall due to : No Fall Risks    Orthopedic patient  Follow up Falls prevention discussed    Falls prevention discussed    MEDICARE RISK AT HOME:  Medicare Risk at Home - 06/08/23 0848     Any stairs in or around the home? No    If so, are there any without handrails? No    Home free of loose throw rugs in walkways, pet beds, electrical cords, etc? Yes    Adequate lighting in your home to reduce risk of falls? Yes    Life alert? No    Use of a cane, walker or w/c? No    Grab bars in the bathroom? Yes    Shower chair or bench in shower? Yes    Elevated toilet seat or a handicapped toilet? Yes             TIMED UP AND GO:  Was the test performed?  No    Cognitive Function:    03/30/2018    9:08 AM 10/20/2016    8:47 AM 05/17/2015    2:19 PM  MMSE - Mini Mental State Exam  Orientation to time 4 5 5   Orientation to Place 5 5 5   Registration 3 3 3   Attention/ Calculation 5 3 5   Recall 2 3 3   Language- name 2 objects 2 2 2   Language- repeat 1 1 1   Language- follow 3 step command 3 3 3   Language- read & follow direction 1 1 1   Write a sentence 1 1 1   Copy design 1 1 1   Total score 28 28 30         06/08/2023    8:52 AM 06/05/2022    9:11 AM 06/04/2021    9:16 AM 06/26/2019    3:08 PM  6CIT Screen  What Year? 0 points 0 points 0 points 0 points  What month? 0 points 0 points 0 points 0 points  What time? 0 points 0  points 0 points 0 points  Count back from 20 0 points 0 points 0 points 0 points  Months in reverse 0 points 4 points 2 points 2 points  Repeat phrase 0 points 6 points 4 points 4 points  Total Score 0 points 10 points 6 points 6 points    Immunizations Immunization History  Administered Date(s) Administered   Pneumococcal Conjugate-13 03/29/2015   Pneumococcal Polysaccharide-23 10/14/2002, 12/27/2013   Td 12/14/2008, 12/27/2013  TDAP status: Up to date  Flu Vaccine status: Declined, Education has been provided regarding the importance of this vaccine but patient still declined. Advised may receive this vaccine at local pharmacy or Health Dept. Aware to provide a copy of the vaccination record if obtained from local pharmacy or Health Dept. Verbalized acceptance and understanding.  Pneumococcal vaccine status: Up to date  Covid-19 vaccine status: Declined, Education has been provided regarding the importance of this vaccine but patient still declined. Advised may receive this vaccine at local pharmacy or Health Dept.or vaccine clinic. Aware to provide a copy of the vaccination record if obtained from local pharmacy or Health Dept. Verbalized acceptance and understanding.  Qualifies for Shingles Vaccine? Yes   Zostavax completed No   Shingrix Completed?: No.    Education has been provided regarding the importance of this vaccine. Patient has been advised to call insurance company to determine out of pocket expense if they have not yet received this vaccine. Advised may also receive vaccine at local pharmacy or Health Dept. Verbalized acceptance and understanding.  Screening Tests Health Maintenance  Topic Date Due   Diabetic kidney evaluation - Urine ACR  Never done   Lung Cancer Screening  03/19/2007   OPHTHALMOLOGY EXAM  12/02/2023 (Originally 11/13/1956)   COVID-19 Vaccine (1) 06/01/2024 (Originally 11/12/1951)   Zoster Vaccines- Shingrix (1 of 2) 06/01/2024 (Originally  11/12/1965)   INFLUENZA VACCINE  07/15/2023   HEMOGLOBIN A1C  12/02/2023   DTaP/Tdap/Td (3 - Tdap) 12/28/2023   Diabetic kidney evaluation - eGFR measurement  06/01/2024   FOOT EXAM  06/01/2024   Medicare Annual Wellness (AWV)  06/07/2024   Pneumonia Vaccine 109+ Years old  Completed   Hepatitis C Screening  Completed   HPV VACCINES  Aged Out   Colonoscopy  Discontinued    Health Maintenance  Health Maintenance Due  Topic Date Due   Diabetic kidney evaluation - Urine ACR  Never done   Lung Cancer Screening  03/19/2007    Colorectal cancer screening: No longer required.   Lung Cancer Screening: (Low Dose CT Chest recommended if Age 68-80 years, 20 pack-year currently smoking OR have quit w/in 15years.) does qualify.   Lung Cancer Screening Referral: 03/01/2023  Additional Screening:  Hepatitis C Screening: does not qualify; Completed 11/22/2020  Vision Screening: Recommended annual ophthalmology exams for early detection of glaucoma and other disorders of the eye. Is the patient up to date with their annual eye exam?  Yes  Who is the provider or what is the name of the office in which the patient attends annual eye exams? Dr.Davis  If pt is not established with a provider, would they like to be referred to a provider to establish care? No .   Dental Screening: Recommended annual dental exams for proper oral hygiene  Diabetic Foot Exam: Diabetic Foot Exam: Overdue, Pt has been advised about the importance in completing this exam. Pt is scheduled for diabetic foot exam on next office visit .  Community Resource Referral / Chronic Care Management: CRR required this visit?  No   CCM required this visit?  No     Plan:     I have personally reviewed and noted the following in the patient's chart:   Medical and social history Use of alcohol, tobacco or illicit drugs  Current medications and supplements including opioid prescriptions. Patient is not currently taking opioid  prescriptions. Functional ability and status Nutritional status Physical activity Advanced directives List of other physicians Hospitalizations, surgeries, and  ER visits in previous 12 months Vitals Screenings to include cognitive, depression, and falls Referrals and appointments  In addition, I have reviewed and discussed with patient certain preventive protocols, quality metrics, and best practice recommendations. A written personalized care plan for preventive services as well as general preventive health recommendations were provided to patient.     Lorrene Reid, LPN   02/18/6577   After Visit Summary: (MyChart) Due to this being a telephonic visit, the after visit summary with patients personalized plan was offered to patient via MyChart   Nurse Notes: none

## 2023-06-08 NOTE — Patient Instructions (Signed)
Mr. Luis Salinas , Thank you for taking time to come for your Medicare Wellness Visit. I appreciate your ongoing commitment to your health goals. Please review the following plan we discussed and let me know if I can assist you in the future.   These are the goals we discussed:  Goals      DIET - INCREASE WATER INTAKE     Try to drink 6-8 glasses of water daily.        This is a list of the screening recommended for you and due dates:  Health Maintenance  Topic Date Due   Yearly kidney health urinalysis for diabetes  Never done   Screening for Lung Cancer  03/19/2007   Eye exam for diabetics  12/02/2023*   COVID-19 Vaccine (1) 06/01/2024*   Zoster (Shingles) Vaccine (1 of 2) 06/01/2024*   Flu Shot  07/15/2023   Hemoglobin A1C  12/02/2023   DTaP/Tdap/Td vaccine (3 - Tdap) 12/28/2023   Yearly kidney function blood test for diabetes  06/01/2024   Complete foot exam   06/01/2024   Medicare Annual Wellness Visit  06/07/2024   Pneumonia Vaccine  Completed   Hepatitis C Screening  Completed   HPV Vaccine  Aged Out   Colon Cancer Screening  Discontinued  *Topic was postponed. The date shown is not the original due date.    Advanced directives: Advance directive discussed with you today. I have provided a copy for you to complete at home and have notarized. Once this is complete please bring a copy in to our office so we can scan it into your chart.   Conditions/risks identified: Aim for 30 minutes of exercise or brisk walking, 6-8 glasses of water, and 5 servings of fruits and vegetables each day.   Next appointment: Follow up in one year for your annual wellness visit.   Preventive Care 11 Years and Older, Male  Preventive care refers to lifestyle choices and visits with your health care provider that can promote health and wellness. What does preventive care include? A yearly physical exam. This is also called an annual well check. Dental exams once or twice a year. Routine eye exams.  Ask your health care provider how often you should have your eyes checked. Personal lifestyle choices, including: Daily care of your teeth and gums. Regular physical activity. Eating a healthy diet. Avoiding tobacco and drug use. Limiting alcohol use. Practicing safe sex. Taking low doses of aspirin every day. Taking vitamin and mineral supplements as recommended by your health care provider. What happens during an annual well check? The services and screenings done by your health care provider during your annual well check will depend on your age, overall health, lifestyle risk factors, and family history of disease. Counseling  Your health care provider may ask you questions about your: Alcohol use. Tobacco use. Drug use. Emotional well-being. Home and relationship well-being. Sexual activity. Eating habits. History of falls. Memory and ability to understand (cognition). Work and work Astronomer. Screening  You may have the following tests or measurements: Height, weight, and BMI. Blood pressure. Lipid and cholesterol levels. These may be checked every 5 years, or more frequently if you are over 71 years old. Skin check. Lung cancer screening. You may have this screening every year starting at age 60 if you have a 30-pack-year history of smoking and currently smoke or have quit within the past 15 years. Fecal occult blood test (FOBT) of the stool. You may have this test every year starting  at age 67. Flexible sigmoidoscopy or colonoscopy. You may have a sigmoidoscopy every 5 years or a colonoscopy every 10 years starting at age 72. Prostate cancer screening. Recommendations will vary depending on your family history and other risks. Hepatitis C blood test. Hepatitis B blood test. Sexually transmitted disease (STD) testing. Diabetes screening. This is done by checking your blood sugar (glucose) after you have not eaten for a while (fasting). You may have this done every 1-3  years. Abdominal aortic aneurysm (AAA) screening. You may need this if you are a current or former smoker. Osteoporosis. You may be screened starting at age 29 if you are at high risk. Talk with your health care provider about your test results, treatment options, and if necessary, the need for more tests. Vaccines  Your health care provider may recommend certain vaccines, such as: Influenza vaccine. This is recommended every year. Tetanus, diphtheria, and acellular pertussis (Tdap, Td) vaccine. You may need a Td booster every 10 years. Zoster vaccine. You may need this after age 73. Pneumococcal 13-valent conjugate (PCV13) vaccine. One dose is recommended after age 77. Pneumococcal polysaccharide (PPSV23) vaccine. One dose is recommended after age 37. Talk to your health care provider about which screenings and vaccines you need and how often you need them. This information is not intended to replace advice given to you by your health care provider. Make sure you discuss any questions you have with your health care provider. Document Released: 12/27/2015 Document Revised: 08/19/2016 Document Reviewed: 10/01/2015 Elsevier Interactive Patient Education  2017 [employer] Prevention in the Home Falls can cause injuries. They can happen to people of all ages. There are many things you can do to make your home safe and to help prevent falls. What can I do on the outside of my home? Regularly fix the edges of walkways and driveways and fix any cracks. Remove anything that might make you trip as you walk through a door, such as a raised step or threshold. Trim any bushes or trees on the path to your home. Use bright outdoor lighting. Clear any walking paths of anything that might make someone trip, such as rocks or tools. Regularly check to see if handrails are loose or broken. Make sure that both sides of any steps have handrails. Any raised decks and porches should have guardrails on the  edges. Have any leaves, snow, or ice cleared regularly. Use sand or salt on walking paths during winter. Clean up any spills in your garage right away. This includes oil or grease spills. What can I do in the bathroom? Use night lights. Install grab bars by the toilet and in the tub and shower. Do not use towel bars as grab bars. Use non-skid mats or decals in the tub or shower. If you need to sit down in the shower, use a plastic, non-slip stool. Keep the floor dry. Clean up any water that spills on the floor as soon as it happens. Remove soap buildup in the tub or shower regularly. Attach bath mats securely with double-sided non-slip rug tape. Do not have throw rugs and other things on the floor that can make you trip. What can I do in the bedroom? Use night lights. Make sure that you have a light by your bed that is easy to reach. Do not use any sheets or blankets that are too big for your bed. They should not hang down onto the floor. Have a firm chair that has side arms. You  can use this for support while you get dressed. Do not have throw rugs and other things on the floor that can make you trip. What can I do in the kitchen? Clean up any spills right away. Avoid walking on wet floors. Keep items that you use a lot in easy-to-reach places. If you need to reach something above you, use a strong step stool that has a grab bar. Keep electrical cords out of the way. Do not use floor polish or wax that makes floors slippery. If you must use wax, use non-skid floor wax. Do not have throw rugs and other things on the floor that can make you trip. What can I do with my stairs? Do not leave any items on the stairs. Make sure that there are handrails on both sides of the stairs and use them. Fix handrails that are broken or loose. Make sure that handrails are as long as the stairways. Check any carpeting to make sure that it is firmly attached to the stairs. Fix any carpet that is loose or  worn. Avoid having throw rugs at the top or bottom of the stairs. If you do have throw rugs, attach them to the floor with carpet tape. Make sure that you have a light switch at the top of the stairs and the bottom of the stairs. If you do not have them, ask someone to add them for you. What else can I do to help prevent falls? Wear shoes that: Do not have high heels. Have rubber bottoms. Are comfortable and fit you well. Are closed at the toe. Do not wear sandals. If you use a stepladder: Make sure that it is fully opened. Do not climb a closed stepladder. Make sure that both sides of the stepladder are locked into place. Ask someone to hold it for you, if possible. Clearly mark and make sure that you can see: Any grab bars or handrails. First and last steps. Where the edge of each step is. Use tools that help you move around (mobility aids) if they are needed. These include: Canes. Walkers. Scooters. Crutches. Turn on the lights when you go into a dark area. Replace any light bulbs as soon as they burn out. Set up your furniture so you have a clear path. Avoid moving your furniture around. If any of your floors are uneven, fix them. If there are any pets around you, be aware of where they are. Review your medicines with your doctor. Some medicines can make you feel dizzy. This can increase your chance of falling. Ask your doctor what other things that you can do to help prevent falls. This information is not intended to replace advice given to you by your health care provider. Make sure you discuss any questions you have with your health care provider. Document Released: 09/26/2009 Document Revised: 05/07/2016 Document Reviewed: 01/04/2015 Elsevier Interactive Patient Education  2017 [employer].

## 2023-06-14 NOTE — Progress Notes (Unsigned)
Cardiology Clinic Note   Patient Name: Luis Salinas Date of Encounter: 06/16/2023  Primary Care Provider:  Dettinger, Elige Radon, MD Primary Cardiologist:  Little Ishikawa, MD  Patient Profile    Luis Salinas 77 year old male presents to the clinic today for follow-up evaluation of his coronary artery disease, paroxysmal atrial fibrillation, and hypertension.  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 His family found him after 27 years   Arthritis    Cataract    Chronic diastolic CHF (congestive heart failure) (HCC)    COPD (chronic obstructive pulmonary disease) (HCC)    Dyslipidemia    History of concussion    History of STEMI    a. 11/2007 s/p DES to LCX at [facility]   Hypertension    Paroxysmal atrial fibrillation (HCC)    Pneumonia    exposure to caustic chemicals   Stroke [facility])    multiple strokes   Tobacco abuse    Type 2 diabetes mellitus (HCC)    Past Surgical History:  Procedure Laterality Date   ANTERIOR CERVICAL DECOMP/DISCECTOMY FUSION  05/16/2012   Procedure: ANTERIOR CERVICAL DECOMPRESSION/DISCECTOMY FUSION 2 LEVELS;  Surgeon: Barnett Abu, MD;  Location: [facility] NEURO ORS;  Service: Neurosurgery;  Laterality: N/A;  Cervical Five-Six, Cervical Six-Seven Anterior Cervical Decompression/Diskectomy Fusion   CARDIAC CATHETERIZATION     CARDIOVERSION N/A 11/10/2021   Procedure: CARDIOVERSION;  Surgeon: Wendall Stade, MD;  Location: [facility] ENDOSCOPY;  Service: Cardiovascular;  Laterality: N/A;   COLONOSCOPY W/ POLYPECTOMY     CORONARY ANGIOPLASTY     EYE SURGERY     cataracts   JOINT REPLACEMENT     Left knee replacement   JOINT REPLACEMENT     left elbow at age 89   ROTATOR CUFF REPAIR     left   TONSILLECTOMY      Allergies  No Known Allergies  History of Present Illness    Luis Salinas has a PMH of hypertension, diabetes, CVA, chronic diastolic CHF, coronary  artery disease status post STEMI 2008 with BMS to his circumflex (catheterization performed at [facility]).  His PMH also includes COPD, HTN, tobacco use, and atrial fibrillation.  CHA2DS2-VASc score 8.  He was seen by Dr. Bjorn Pippin on 12/25/2021.  During that time it was noted that he had been admitted 10/22 with acute on chronic diastolic CHF.  He improved with diuresis.  He was also found to have atrial fibrillation which was a new diagnosis.  He was started on Eliquis and Bystolic.  He underwent cardioversion 11/04/2021.  At follow-up he was noted to have a weight loss of 9 pounds, denied bleeding issues and reported compliance with apixaban.  He denied chest pain, shortness of breath, lightheadedness, syncope and lower extremity swelling.  He was seen in follow-up by Alphonzo Severance, PA on 02/17/2022.  He reported feeling great.  He was noncardiac aware.  He reported that his weight had been stable.  He was not noted to have any lower extremity swelling.  He denied bleeding issues and reported compliance with his apixaban.  He denied palpitations, chest pain, orthopnea, lower extremity swelling, syncope, snoring, neurologic changes.  He was tolerating his medications well and had no complaints at that time.  He presented to the clinic 03/18/22 for follow-up evaluation stated he felt well.  He was not very active due to his COPD.  He presented to the clinic  without oxygen.  However, he wore oxygen at home.  He denied bleeding issues and reported compliance with his apixaban medication.  He did note that he missed 1 dose yesterday evening.  He has a history of CVA which affected his short-term memory.  His wife helped him with his medication.  He also reported that he had amnesia for around 26 years.  He was tracked down by a Photographer and returned to [place] after that time.  I continued his current medication therapy.  Asked him to increase his physical activity as tolerated and planned follow-up for  6 months.  He presents to the clinic today for follow-up evaluation and states he continues to do well from a heart standpoint.  His EKG today shows sinus rhythm with first-degree AV block left axis deviation.  He reports that 6 months ago he took a sublingual nitroglycerin for some rib pain.  He denies exertional chest discomfort.  He does notice occasional discomfort which he feels is related to musculoskeletal issues.  He continues to use 4 L of oxygen at home.  He presents to the clinic today without oxygen.  On exam he has some inspiratory wheezes.  He reports that he has constant cough and produces white phlegm.  I will continue his current medication regimen and plan follow-up in 12 months.  Today he denies chest pain, shortness of breath, lower extremity edema, fatigue, palpitations, melena, hematuria, hemoptysis, diaphoresis, weakness, presyncope, syncope, orthopnea, and PND.   Home Medications    Prior to Admission medications   Medication Sig Start Date End Date Taking? Authorizing Provider  ACCU-CHEK GUIDE test strip USE TO CHECK SUGAR UP TO 4 TIMES DAILY AS DIRECTED 09/24/21   [provider]  Accu-Chek Softclix Lancets lancets SMARTSIG:Topical 1-4 Times Daily 09/25/21   [provider]  Albuterol Sulfate 108 (90 Base) MCG/ACT AEPB Inhale 2 puffs into the lungs every 6 (six) hours as needed (shortness of breath/ wheeze). 04/09/18   Raliegh Ip, DO  apixaban (ELIQUIS) 5 MG TABS tablet Take 1 tablet (5 mg total) by mouth 2 (two) times daily. 10/01/21   Dettinger, Elige Radon, MD  aspirin 81 MG EC tablet TAKE 1 TABLET (81 MG TOTAL) BY MOUTH DAILY. SWALLOW WHOLE. 10/23/21   Dettinger, Elige Radon, MD  blood glucose meter kit and supplies KIT Dispense based on patient and insurance preference. Use up to four times daily as directed. 09/24/21   Briant Cedar, MD  Budeson-Glycopyrrol-Formoterol (BREZTRI AEROSPHERE) 160-9-4.8 MCG/ACT AERO Inhale 2 puffs into the lungs  2 (two) times daily. 09/04/21   Dettinger, Elige Radon, MD  diphenhydramine-acetaminophen (TYLENOL PM) 25-500 MG TABS tablet Take 1 tablet by mouth at bedtime as needed (sleep/pain.).    [provider]  empagliflozin (JARDIANCE) 10 MG TABS tablet Take 1 tablet (10 mg total) by mouth daily. 10/01/21   Dettinger, Elige Radon, MD  furosemide (LASIX) 40 MG tablet Take 1 tablet (40 mg total) by mouth daily. 10/01/21   Dettinger, Elige Radon, MD  Menthol-Methyl Salicylate (SALONPAS PAIN RELIEF PATCH) PTCH Place 1 patch onto the skin daily as needed (pain.).    [provider]  Multiple Vitamins-Minerals (ICAPS AREDS FORMULA PO) Take 1 capsule by mouth 2 (two) times daily.    [provider]  nebivolol (BYSTOLIC) 10 MG tablet Take 1.5 tablets (15 mg total) by mouth daily. 12/25/21   Little Ishikawa, MD  nitroGLYCERIN (NITROSTAT) 0.4 MG SL tablet Place  1 tablet (0.4 mg total) under the tongue every 5 (five) minutes as needed for chest pain. 11/26/21   Dettinger, Elige Radon, MD  OXYGEN Inhale 4 L into the lungs daily.    [provider]  potassium chloride SA (KLOR-CON) 20 MEQ tablet Take 1 tablet (20 mEq total) by mouth daily. 10/01/21   Dettinger, Elige Radon, MD  rosuvastatin (CRESTOR) 40 MG tablet Take 1 tablet (40 mg total) by mouth daily. 11/26/21   Dettinger, Elige Radon, MD    Family History    Family History  Problem Relation Age of Onset   Heart disease Mother    COPD Father    Aneurysm Father    Anesthesia problems Neg Hx    Hypotension Neg Hx    Malignant hyperthermia Neg Hx    Pseudochol deficiency Neg Hx    He indicated that his mother is deceased. He indicated that his father is deceased. He indicated that all of his four sisters are alive. He indicated that his brother is alive. He indicated that the status of his neg hx is unknown.  Social History    Social History   Socioeconomic History   Marital status: Married    Spouse name: Zella Ball   Number of  children: 7   Years of education: college   Highest education level: Some college, no degree  Occupational History   Occupation: Retired    Comment: Proofreader  Tobacco Use   Smoking status: Light Smoker    Types: Cigars    Last attempt to quit: 04/03/2013    Years since quitting: 10.2   Smokeless tobacco: Never   Tobacco comments:    1-2 cigars per day.  Smoked 40 years cigarettes.  10 years ago quit cigarettes.  Vaping Use   Vaping Use: Never used  Substance and Sexual Activity   Alcohol use: No    Alcohol/week: 0.0 standard drinks of alcohol   Drug use: Yes    Types: Marijuana    Comment: offer he will smoke 01/20/22   Sexual activity: Yes  Other Topics Concern   Not on file  Social History Narrative   Lives with his fourth wife.    Says his family found him in 1992 and he had no recollection of his childhood -believes to have had several head injuries    Social Determinants of Health   Financial Resource Strain: Low Risk  (06/08/2023)   Overall Financial Resource Strain (CARDIA)    Difficulty of Paying Living Expenses: Not hard at all  Food Insecurity: No Food Insecurity (06/08/2023)   Hunger Vital Sign    Worried About Running Out of Food in the Last Year: Never true    Ran Out of Food in the Last Year: Never true  Transportation Needs: No Transportation Needs (06/08/2023)   PRAPARE - Administrator, Civil Service (Medical): No    Lack of Transportation (Non-Medical): No  Physical Activity: Insufficiently Active (06/08/2023)   Exercise Vital Sign    Days of Exercise per Week: 2 days    Minutes of Exercise per Session: 20 min  Stress: No Stress Concern Present (06/08/2023)   [employer] of Occupational Health - Occupational Stress Questionnaire    Feeling of Stress : Not at all  Social Connections: Socially Integrated (06/08/2023)   Social Connection and Isolation Panel [NHANES]    Frequency of Communication with Friends and Family: More than three times  a week    Frequency of Social Gatherings with Friends  and Family: More than three times a week    Attends Religious Services: 1 to 4 times per year    Active Member of Clubs or Organizations: Yes    Attends Banker Meetings: More than 4 times per year    Marital Status: Married  Catering manager Violence: Not At Risk (06/08/2023)   Humiliation, Afraid, Rape, and Kick questionnaire    Fear of Current or Ex-Partner: No    Emotionally Abused: No    Physically Abused: No    Sexually Abused: No     Review of Systems    General:  No chills, fever, night sweats or weight changes.  Cardiovascular:  No chest pain, dyspnea on exertion, edema, orthopnea, palpitations, paroxysmal nocturnal dyspnea. Dermatological: No rash, lesions/masses Respiratory: No cough, dyspnea Urologic: No hematuria, dysuria Abdominal:   No nausea, vomiting, diarrhea, bright red blood per rectum, melena, or hematemesis Neurologic:  No visual changes, wkns, changes in mental status. All other systems reviewed and are otherwise negative except as noted above.  Physical Exam    VS:  BP 102/64   Pulse 70   Ht 5' 7.5" (1.715 m)   Wt 127 lb 6.4 oz (57.8 kg)   SpO2 95%   BMI 19.66 kg/m  , BMI Body mass index is 19.66 kg/m. GEN: Well nourished, well developed, in no acute distress. HEENT: normal. Neck: Supple, no JVD, carotid bruits, or masses. Cardiac: RRR, no murmurs, rubs, or gallops. No clubbing, cyanosis, edema.  Radials/DP/PT 2+ and equal bilaterally.  Respiratory:  Respirations regular and unlabored, inspiratory wheeze bilaterally. GI: Soft, nontender, nondistended, BS + x 4. MS: no deformity or atrophy. Skin: warm and dry, no rash. Neuro:  Strength and sensation are intact. Psych: Normal affect.  Accessory Clinical Findings    Recent Labs: 06/02/2023: ALT 22; BUN 14; Creatinine, Ser 1.01; Hemoglobin 15.4; Platelets 184; Potassium 4.3; Sodium 141   Recent Lipid Panel    Component Value  Date/Time   CHOL 107 06/02/2023 1124   CHOL 114 05/01/2013 1302   TRIG 158 (H) 06/02/2023 1124   TRIG 88 03/29/2015 1106   TRIG 141 05/01/2013 1302   HDL 42 06/02/2023 1124   HDL 38 (L) 03/29/2015 1106   HDL 34 (L) 05/01/2013 1302   CHOLHDL 2.5 06/02/2023 1124   CHOLHDL 4.0 03/17/2013 0415   VLDL 12 03/17/2013 0415   LDLCALC 38 06/02/2023 1124   LDLCALC 52 05/01/2013 1302    ECG personally reviewed by me today-first-degree AV block left axis deviation 70 bpm.  Echocardiogram 09/20/2021  IMPRESSIONS     1. Left ventricular ejection fraction, by estimation, is 60 to 65%. The  left ventricle has normal function. The left ventricle has no regional  wall motion abnormalities. There is mild left ventricular hypertrophy.  Left ventricular diastolic parameters  are consistent with Grade II diastolic dysfunction (pseudonormalization).  There is the interventricular septum is flattened in systole and diastole,  consistent with right ventricular pressure and volume overload.   2. Right ventricular systolic function is moderately reduced. The right  ventricular size is normal. There is moderately elevated pulmonary artery  systolic pressure.   3. Left atrial size was severely dilated.   4. Right atrial size was moderately dilated.   5. The mitral valve is normal in structure. No evidence of mitral valve  regurgitation. No evidence of mitral stenosis.   6. The aortic valve is tricuspid. There is moderate calcification of the  aortic valve. There is moderate thickening of  the aortic valve. Aortic  valve regurgitation is not visualized. Mild to moderate aortic valve  stenosis.   7. The inferior vena cava is dilated in size with <50% respiratory  variability, suggesting right atrial pressure of 15 mmHg.  Assessment & Plan   1.  Coronary artery disease-denies chest discomfort today.  Denies recent episodes of arm neck back or chest discomfort.  STEMI 2008 with BMS to his circumflex at  [facility].  Continues to stay physically active and continues to deny exertional chest discomfort. Continue aspirin, apixaban, nebivolol, Heart healthy low-sodium diet-reviewed  maintain physical activity  Chronic diastolic CHF-euvolemic . Weight stable.  No lower extremity edema.  Echo 09/20/2021 showed G2 DD and normal LVEF.  Details above. Continue Jardiance, furosemide, nebivolol, potassium Heart healthy low-sodium diet Increase physical activity as tolerated Order CBC  Essential hypertension-BP today 102/64.  Well-controlled at home. Continue nebivolol Heart healthy low-sodium diet-salty 6 given Increase physical activity as tolerated Order BMP  Hyperlipidemia-06/02/2023: Cholesterol, Total 107; HDL 42; LDL Chol Calc (NIH) 38; Triglycerides 158 Continue rosuvastatin, aspirin Heart healthy low-sodium high-fiber diet Increase physical activity as tolerated   Atrial fibrillation-EKG today shows sinus rhythm with first-degree AV block left axis deviation 70 bpm.  Cardiac unaware.  Remains compliant with apixaban and denies bleeding issues. Continue nebivolol, apixaban Avoid triggers caffeine, chocolate, EtOH, dehydration etc.    Disposition: Follow-up with Dr. Bjorn Pippin or me in 9-12 months.  Thomasene Ripple. Chandni Gagan NP-C    06/16/2023, 11:18 AM [facility] [address] Office [phone] Fax [phone]  Notice: This dictation was prepared with Dragon dictation along with smaller phrase technology. Any transcriptional errors that result from this process are unintentional and may not be corrected upon review.  I spent 14 minutes examining this patient, reviewing medications, and using patient centered shared decision making involving her cardiac care.  Prior to her visit I spent greater than 20 minutes reviewing her past medical history,  medications, and prior cardiac tests.

## 2023-06-16 ENCOUNTER — Encounter: Payer: Self-pay | Admitting: General Practice

## 2023-06-16 ENCOUNTER — Ambulatory Visit: Payer: Medicare HMO | Attending: General Practice | Admitting: General Practice

## 2023-06-16 VITALS — BP 102/64 | HR 70 | Ht 67.5 in | Wt 127.4 lb

## 2023-06-16 DIAGNOSIS — I4819 Other persistent atrial fibrillation: Secondary | ICD-10-CM

## 2023-06-16 DIAGNOSIS — I10 Essential (primary) hypertension: Secondary | ICD-10-CM

## 2023-06-16 DIAGNOSIS — I2510 Atherosclerotic heart disease of native coronary artery without angina pectoris: Secondary | ICD-10-CM

## 2023-06-16 DIAGNOSIS — I5032 Chronic diastolic (congestive) heart failure: Secondary | ICD-10-CM | POA: Diagnosis not present

## 2023-06-16 NOTE — Patient Instructions (Signed)
Medication Instructions:  The current medical regimen is effective;  continue present plan and medications as directed. Please refer to the Current Medication list given to you today.  *If you need a refill on your cardiac medications before your next appointment, please call your pharmacy*  Lab Work: NONE If you have labs (blood work) drawn today and your tests are completely normal, you will receive your results only by:  MyChart Message (if you have MyChart) OR  A paper copy in the mail If you have any lab test that is abnormal or we need to change your treatment, we will call you to review the results.  Other Instructions MAGNESIUM FOODS: Dark, leafy greens such as spinach and kale are excellent sources of magnesium, as well as nuts and seeds like almonds, cashews, and pumpkin seeds. Additionally, incorporating whole grains like quinoa and brown rice into your diet can also help boost magnesium levels.  Follow-Up: At [facility], you and your health needs are our priority.  As part of our continuing mission to provide you with exceptional heart care, we have created designated Provider Care Teams.  These Care Teams include your primary Cardiologist (physician) and Advanced Practice Providers (APPs -  Physician Assistants and Nurse Practitioners) who all work together to provide you with the care you need, when you need it.  Your next appointment:   12 month(s)  Provider:   Little Ishikawa, MD  or Edd Fabian, FNP

## 2023-06-27 ENCOUNTER — Other Ambulatory Visit: Payer: Self-pay | Admitting: Nurse Practitioner

## 2023-06-27 ENCOUNTER — Other Ambulatory Visit: Payer: Self-pay | Admitting: Family Medicine

## 2023-06-28 MED ORDER — NITROGLYCERIN 0.4 MG SL SUBL: 0.4000 mg | SUBLINGUAL_TABLET | SUBLINGUAL | 3 refills | Status: AC | PRN

## 2023-06-28 NOTE — Addendum Note (Signed)
Addended by: Julious Payer D on: 06/28/2023 09:44 AM   Modules accepted: Orders

## 2023-07-06 DIAGNOSIS — J449 Chronic obstructive pulmonary disease, unspecified: Secondary | ICD-10-CM | POA: Diagnosis not present

## 2023-07-12 DIAGNOSIS — H3562 Retinal hemorrhage, left eye: Secondary | ICD-10-CM | POA: Diagnosis not present

## 2023-07-26 ENCOUNTER — Other Ambulatory Visit: Payer: Self-pay | Admitting: Cardiology

## 2023-08-06 DIAGNOSIS — J449 Chronic obstructive pulmonary disease, unspecified: Secondary | ICD-10-CM | POA: Diagnosis not present

## 2023-08-14 ENCOUNTER — Other Ambulatory Visit: Payer: Self-pay | Admitting: Nurse Practitioner

## 2023-08-18 ENCOUNTER — Other Ambulatory Visit: Payer: Self-pay | Admitting: Cardiology

## 2023-09-02 ENCOUNTER — Ambulatory Visit (INDEPENDENT_AMBULATORY_CARE_PROVIDER_SITE_OTHER): Payer: Medicare HMO | Admitting: Family Medicine

## 2023-09-02 ENCOUNTER — Encounter: Payer: Self-pay | Admitting: Family Medicine

## 2023-09-02 VITALS — BP 91/60 | HR 67 | Temp 98.7°F | Ht 67.5 in | Wt 126.2 lb

## 2023-09-02 DIAGNOSIS — E1159 Type 2 diabetes mellitus with other circulatory complications: Secondary | ICD-10-CM

## 2023-09-02 DIAGNOSIS — E785 Hyperlipidemia, unspecified: Secondary | ICD-10-CM

## 2023-09-02 DIAGNOSIS — I5042 Chronic combined systolic (congestive) and diastolic (congestive) heart failure: Secondary | ICD-10-CM | POA: Diagnosis not present

## 2023-09-02 DIAGNOSIS — I480 Paroxysmal atrial fibrillation: Secondary | ICD-10-CM | POA: Diagnosis not present

## 2023-09-02 DIAGNOSIS — E1169 Type 2 diabetes mellitus with other specified complication: Secondary | ICD-10-CM | POA: Diagnosis not present

## 2023-09-02 DIAGNOSIS — I110 Hypertensive heart disease with heart failure: Secondary | ICD-10-CM | POA: Diagnosis not present

## 2023-09-02 DIAGNOSIS — Z7984 Long term (current) use of oral hypoglycemic drugs: Secondary | ICD-10-CM

## 2023-09-02 DIAGNOSIS — I2510 Atherosclerotic heart disease of native coronary artery without angina pectoris: Secondary | ICD-10-CM

## 2023-09-02 DIAGNOSIS — I10 Essential (primary) hypertension: Secondary | ICD-10-CM | POA: Diagnosis not present

## 2023-09-02 LAB — CBC WITH DIFFERENTIAL/PLATELET
Basophils Absolute: 0.1 10*3/uL (ref 0.0–0.2)
Basos: 1 %
EOS (ABSOLUTE): 0.5 10*3/uL — ABNORMAL HIGH (ref 0.0–0.4)
Eos: 5 %
Hematocrit: 46.4 % (ref 37.5–51.0)
Hemoglobin: 15.2 g/dL (ref 13.0–17.7)
Immature Grans (Abs): 0 10*3/uL (ref 0.0–0.1)
Immature Granulocytes: 0 %
Lymphocytes Absolute: 2.7 10*3/uL (ref 0.7–3.1)
Lymphs: 26 %
MCH: 29.6 pg (ref 26.6–33.0)
MCHC: 32.8 g/dL (ref 31.5–35.7)
MCV: 90 fL (ref 79–97)
Monocytes Absolute: 0.9 10*3/uL (ref 0.1–0.9)
Monocytes: 8 %
Neutrophils Absolute: 6.4 10*3/uL (ref 1.4–7.0)
Neutrophils: 60 %
Platelets: 176 10*3/uL (ref 150–450)
RBC: 5.14 x10E6/uL (ref 4.14–5.80)
RDW: 13.2 % (ref 11.6–15.4)
WBC: 10.6 10*3/uL (ref 3.4–10.8)

## 2023-09-02 LAB — CMP14+EGFR
ALT: 20 IU/L (ref 0–44)
AST: 20 IU/L (ref 0–40)
Albumin: 4.3 g/dL (ref 3.8–4.8)
Alkaline Phosphatase: 104 IU/L (ref 44–121)
BUN/Creatinine Ratio: 18 (ref 10–24)
BUN: 20 mg/dL (ref 8–27)
Bilirubin Total: 0.3 mg/dL (ref 0.0–1.2)
CO2: 27 mmol/L (ref 20–29)
Calcium: 9.5 mg/dL (ref 8.6–10.2)
Chloride: 102 mmol/L (ref 96–106)
Creatinine, Ser: 1.09 mg/dL (ref 0.76–1.27)
Globulin, Total: 2.5 g/dL (ref 1.5–4.5)
Glucose: 96 mg/dL (ref 70–99)
Potassium: 4.3 mmol/L (ref 3.5–5.2)
Sodium: 142 mmol/L (ref 134–144)
Total Protein: 6.8 g/dL (ref 6.0–8.5)
eGFR: 70 mL/min/{1.73_m2} (ref >59)

## 2023-09-02 LAB — BAYER DCA HB A1C WAIVED: HB A1C (BAYER DCA - WAIVED): 5.9 % — ABNORMAL HIGH (ref 4.8–5.6)

## 2023-09-02 MED ORDER — ASPIRIN 81 MG PO TBEC
81.0000 mg | DELAYED_RELEASE_TABLET | Freq: Every day | ORAL | 3 refills | Status: DC
Start: 2023-09-02 — End: 2024-10-05

## 2023-09-02 MED ORDER — EMPAGLIFLOZIN 10 MG PO TABS
10.0000 mg | ORAL_TABLET | Freq: Every day | ORAL | 3 refills | Status: DC
Start: 2023-09-02 — End: 2023-10-22

## 2023-09-02 NOTE — Progress Notes (Signed)
BP 91/60   Pulse 67   Temp 98.7 F (37.1 C) (Temporal)   Ht 5' 7.5" (1.715 m)   Wt 126 lb 3.2 oz (57.2 kg)   SpO2 99%   BMI 19.47 kg/m    Subjective:   [name], male    DOB: 1946-02-10, 77 y.o.   MRN: [id number]  HPI: Luis Salinas is a 77 y.o. male presenting on 09/02/2023 for Medical Management of Chronic Issues (3 month)   HPI Type 2 diabetes mellitus Patient comes in today for recheck of his diabetes. Patient has been currently taking Jardiance, taking that more for heart issues.. Patient is not currently on an ACE inhibitor/ARB. Patient has seen an ophthalmologist this year. Patient denies any new issues with their feet. The symptom started onset as an adult hypertension and hyperlipidemia and CAD and A-fib ARE RELATED TO DM   Hypertension Patient is currently on Bystolic and Jardiance and Eliquis and aspirin, and their blood pressure today is 91/60. Patient denies any lightheadedness or dizziness. Patient denies headaches, blurred vision, chest pains, shortness of breath, or weakness. Denies any side effects from medication and is content with current medication.   Hyperlipidemia and CAD and A-fib, sees cardiology A-fib clinic Patient is coming in for recheck of his hyperlipidemia. The patient is currently taking Bystolic and Jardiance and Eliquis and aspirin. They deny any issues with myalgias or history of liver damage from it. They deny any focal numbness or weakness or chest pain.   Relevant past medical, surgical, family and social history reviewed and updated as indicated. Interim medical history since our last visit reviewed. Allergies and medications reviewed and updated.  Review of Systems  Constitutional:  Negative for chills and fever.  Eyes:  Negative for visual disturbance.  Respiratory:  Negative for shortness of breath and wheezing.   Cardiovascular:  Negative for chest pain and leg swelling.  Musculoskeletal:  Negative for back pain and  gait problem.  Skin:  Negative for rash.  Neurological:  Negative for dizziness and light-headedness.  All other systems reviewed and are negative.   Per HPI unless specifically indicated above   Allergies as of 09/02/2023   No Known Allergies      Medication List        Accurate as of September 02, 2023 11:18 AM. If you have any questions, ask your nurse or doctor.          Accu-Chek Guide test strip Generic drug: glucose blood USE TO CHECK SUGAR UP TO 4 TIMES DAILY AS DIRECTED   Accu-Chek Softclix Lancets lancets SMARTSIG:Topical 1-4 Times Daily   albuterol 108 (90 Base) MCG/ACT inhaler Commonly known as: VENTOLIN HFA TAKE 2 PUFFS BY MOUTH EVERY 6 HOURS AS NEEDED FOR WHEEZE OR SHORTNESS OF BREATH   apixaban 5 MG Tabs tablet Commonly known as: Eliquis Take 1 tablet (5 mg total) by mouth 2 (two) times daily.   aspirin EC 81 MG tablet Commonly known as: [facility] Aspirin Low Dose Take 1 tablet (81 mg total) by mouth daily. What changed: See the new instructions. Changed by: Elige Radon Emmalia Heyboer   b complex vitamins capsule Take 1 capsule by mouth daily.   blood glucose meter kit and supplies Kit Dispense based on patient and insurance preference. Use up to four times daily as directed.   Breztri Aerosphere 160-9-4.8 MCG/ACT Aero Generic drug: Budeson-Glycopyrrol-Formoterol Inhale 2 puffs into the lungs 2 (two) times daily.   diphenhydramine-acetaminophen 25-500 MG Tabs tablet Commonly  known as: TYLENOL PM Take 1 tablet by mouth at bedtime as needed (sleep/pain.).   empagliflozin 10 MG Tabs tablet Commonly known as: Jardiance Take 1 tablet (10 mg total) by mouth daily.   furosemide 40 MG tablet Commonly known as: LASIX Take 1 tablet (40 mg total) by mouth daily.   nebivolol 10 MG tablet Commonly known as: BYSTOLIC Take 1 tablet (10 mg total) by mouth daily.   nebivolol 5 MG tablet Commonly known as: BYSTOLIC TAKE 1 TABLET (5 MG TOTAL) BY MOUTH DAILY. WITH  10 MG TABLET TO EQUAL 15 MG DAILY   nitroGLYCERIN 0.4 MG SL tablet Commonly known as: NITROSTAT Place 1 tablet (0.4 mg total) under the tongue every 5 (five) minutes as needed for chest pain.   OVER THE COUNTER MEDICATION Take by mouth daily. preservision   OXYGEN Inhale 4 L into the lungs daily.   potassium chloride SA 20 MEQ tablet Commonly known as: Klor-Con M20 Take 1 tablet (20 mEq total) by mouth daily.   PRESERVISION AREDS 2 PO Take 1 tablet by mouth daily.   rosuvastatin 40 MG tablet Commonly known as: CRESTOR Take 1 tablet (40 mg total) by mouth daily.   Salonpas Pain Relief Patch Ptch Place 1 patch onto the skin daily as needed (pain.).         Objective:   BP 91/60   Pulse 67   Temp 98.7 F (37.1 C) (Temporal)   Ht 5' 7.5" (1.715 m)   Wt 126 lb 3.2 oz (57.2 kg)   SpO2 99%   BMI 19.47 kg/m   Wt Readings from Last 3 Encounters:  09/02/23 126 lb 3.2 oz (57.2 kg)  06/16/23 127 lb 6.4 oz (57.8 kg)  06/08/23 125 lb (56.7 kg)    Physical Exam Vitals and nursing note reviewed.  Constitutional:      General: He is not in acute distress.    Appearance: He is well-developed. He is not diaphoretic.  Eyes:     General: No scleral icterus.    Conjunctiva/sclera: Conjunctivae normal.  Neck:     Thyroid: No thyromegaly.  Cardiovascular:     Rate and Rhythm: Normal rate and regular rhythm.     Heart sounds: Normal heart sounds. No murmur heard. Pulmonary:     Effort: Pulmonary effort is normal. No respiratory distress.     Breath sounds: Wheezing (Minimal anterior wheezing) present.  Musculoskeletal:        General: No swelling. Normal range of motion.     Cervical back: Neck supple.  Lymphadenopathy:     Cervical: No cervical adenopathy.  Skin:    General: Skin is warm and dry.     Findings: No rash.  Neurological:     Mental Status: He is alert and oriented to person, place, and time.     Coordination: Coordination normal.  Psychiatric:         Behavior: Behavior normal.     Results for orders placed or performed in visit on 07/28/23  HM DIABETES EYE EXAM  Result Value Ref Range   HM Diabetic Eye Exam No Retinopathy No Retinopathy    Assessment & Plan:   Problem List Items Addressed This Visit       Cardiovascular and Mediastinum   Hypertension - Primary   Relevant Medications   aspirin EC ([facility]S ASPIRIN LOW DOSE) 81 MG tablet   Other Relevant Orders   CBC with Differential/Platelet   CMP14+EGFR   CAD in native artery   Relevant  Medications   aspirin EC ([facility]S ASPIRIN LOW DOSE) 81 MG tablet   Paroxysmal atrial fibrillation (HCC)   Relevant Medications   aspirin EC ([facility]S ASPIRIN LOW DOSE) 81 MG tablet     Endocrine   Type 2 diabetes mellitus (HCC)   Relevant Medications   aspirin EC ([facility]S ASPIRIN LOW DOSE) 81 MG tablet   empagliflozin (JARDIANCE) 10 MG TABS tablet   Other Relevant Orders   Bayer DCA Hb A1c Waived     Other   Dyslipidemia   Other Visit Diagnoses     Chronic combined systolic and diastolic congestive heart failure (HCC)       Relevant Medications   aspirin EC ([facility]S ASPIRIN LOW DOSE) 81 MG tablet   empagliflozin (JARDIANCE) 10 MG TABS tablet       Continue current medicine, seems to be doing well, A1c 5.9.  Breathing and lungs sound good.  Only minimal wheezing Follow up plan: Return in about 3 months (around 12/02/2023), or if symptoms worsen or fail to improve, for Diabetes recheck.  Counseling provided for all of the vaccine components Orders Placed This Encounter  Procedures   CBC with Differential/Platelet   CMP14+EGFR   Bayer DCA Hb A1c Waived    Arville Care, MD [facility] 09/02/2023, 11:18 AM

## 2023-09-06 DIAGNOSIS — J449 Chronic obstructive pulmonary disease, unspecified: Secondary | ICD-10-CM | POA: Diagnosis not present

## 2023-09-14 DIAGNOSIS — L03031 Cellulitis of right toe: Secondary | ICD-10-CM | POA: Diagnosis not present

## 2023-09-18 ENCOUNTER — Other Ambulatory Visit: Payer: Self-pay | Admitting: Family Medicine

## 2023-10-06 DIAGNOSIS — J449 Chronic obstructive pulmonary disease, unspecified: Secondary | ICD-10-CM | POA: Diagnosis not present

## 2023-10-21 ENCOUNTER — Telehealth: Payer: Self-pay | Admitting: Cardiology

## 2023-10-21 NOTE — Telephone Encounter (Signed)
Pt c/o medication issue:  1. Name of Medication: empagliflozin (JARDIANCE) 10 MG TABS tablet  2. How are you currently taking this medication (dosage and times per day)? Take 1 tablet (10 mg total) by mouth daily.   3. Are you having a reaction (difficulty breathing--STAT)? No   4. What is your medication issue? Patient's spouse says that medication makes the patient weak and the patient also is not eating. Wants to know if there was a different medication patient could take

## 2023-10-21 NOTE — Telephone Encounter (Signed)
Called and spoke to patient's wife. She wants to know if [place] Pepper can be changed to something else because patient is losing weight and his teeth. Advised her to reach out to PCP since he manages medication. She verbalized understanding and agree. Will call back as needed.

## 2023-10-22 NOTE — Telephone Encounter (Signed)
Spoke to patient Dr.Schumann's advice given.Stated we prescribed Jardiance.He will stop taking.Advised to follow up with PCP if he continues to loose weight.

## 2023-10-22 NOTE — Telephone Encounter (Signed)
Agree, is on Jardiance for diabetes by PCP.  Would defer changes in medications to PCP.  Okay to stop from cardiac standpoint

## 2023-11-03 ENCOUNTER — Telehealth: Payer: Self-pay | Admitting: Family Medicine

## 2023-11-03 NOTE — Telephone Encounter (Signed)
Copied from CRM [id number]. Topic: Clinical - Medication Question >> Nov 03, 2023  9:38 AM Raven B wrote: Reason for CRM: Chrsitopher Chasey wife call back # [phone] wants to know if PT can discontinue/stop taking Jardiance med due to current weight.

## 2023-11-03 NOTE — Telephone Encounter (Signed)
Wife made aware of Dettinger's recommendations.  Per wife cardio said it was ok to d/c the medication.  Reviewed Schumann's telephone encounter with wife to confirm.

## 2023-11-03 NOTE — Telephone Encounter (Signed)
He is more taking the Jardiance to help prevent the congestive heart failure to help with the heart then for the diabetes at this point so I would discuss it with his cardiologist but as far as for the diabetes I do not think he needs to take it for the diabetes.  Ask the cardiologist if they still want him on it for the heart stuff but if not I am okay with him stopping it.

## 2023-11-06 DIAGNOSIS — J449 Chronic obstructive pulmonary disease, unspecified: Secondary | ICD-10-CM | POA: Diagnosis not present

## 2023-12-03 ENCOUNTER — Ambulatory Visit (INDEPENDENT_AMBULATORY_CARE_PROVIDER_SITE_OTHER): Payer: Medicare HMO | Admitting: Family Medicine

## 2023-12-03 ENCOUNTER — Encounter: Payer: Self-pay | Admitting: Family Medicine

## 2023-12-03 VITALS — BP 97/56 | HR 62 | Ht 67.5 in | Wt 125.0 lb

## 2023-12-03 DIAGNOSIS — I10 Essential (primary) hypertension: Secondary | ICD-10-CM | POA: Diagnosis not present

## 2023-12-03 DIAGNOSIS — I5032 Chronic diastolic (congestive) heart failure: Secondary | ICD-10-CM

## 2023-12-03 DIAGNOSIS — R634 Abnormal weight loss: Secondary | ICD-10-CM

## 2023-12-03 DIAGNOSIS — J411 Mucopurulent chronic bronchitis: Secondary | ICD-10-CM

## 2023-12-03 DIAGNOSIS — Z7984 Long term (current) use of oral hypoglycemic drugs: Secondary | ICD-10-CM | POA: Diagnosis not present

## 2023-12-03 DIAGNOSIS — I5043 Acute on chronic combined systolic (congestive) and diastolic (congestive) heart failure: Secondary | ICD-10-CM

## 2023-12-03 DIAGNOSIS — E782 Mixed hyperlipidemia: Secondary | ICD-10-CM | POA: Diagnosis not present

## 2023-12-03 DIAGNOSIS — F17210 Nicotine dependence, cigarettes, uncomplicated: Secondary | ICD-10-CM | POA: Diagnosis not present

## 2023-12-03 DIAGNOSIS — E785 Hyperlipidemia, unspecified: Secondary | ICD-10-CM

## 2023-12-03 DIAGNOSIS — E1169 Type 2 diabetes mellitus with other specified complication: Secondary | ICD-10-CM

## 2023-12-03 LAB — BAYER DCA HB A1C WAIVED: HB A1C (BAYER DCA - WAIVED): 6 % — ABNORMAL HIGH (ref 4.8–5.6)

## 2023-12-03 MED ORDER — POTASSIUM CHLORIDE CRYS ER 20 MEQ PO TBCR
20.0000 meq | EXTENDED_RELEASE_TABLET | Freq: Every day | ORAL | 3 refills | Status: DC
Start: 2023-12-03 — End: 2024-09-18

## 2023-12-03 MED ORDER — ROSUVASTATIN CALCIUM 40 MG PO TABS: 40.0000 mg | ORAL_TABLET | Freq: Every day | ORAL | 3 refills | Status: DC

## 2023-12-03 MED ORDER — BREZTRI AEROSPHERE 160-9-4.8 MCG/ACT IN AERO: 2.0000 | INHALATION_SPRAY | Freq: Two times a day (BID) | RESPIRATORY_TRACT | 11 refills | Status: DC

## 2023-12-03 MED ORDER — FUROSEMIDE 40 MG PO TABS: 40.0000 mg | ORAL_TABLET | Freq: Every day | ORAL | 3 refills | Status: DC

## 2023-12-03 NOTE — Progress Notes (Signed)
BP (!) 97/56   Pulse 62   Ht 5' 7.5" (1.715 m)   Wt 125 lb (56.7 kg)   SpO2 (!) 73%   BMI 19.29 kg/m    Subjective:   [name], male    DOB: [birth year], 77 y.o.   MRN: [id number]  HPI: Luis Salinas is a 77 y.o. male presenting on 12/03/2023 for Medical Management of Chronic Issues   HPI Type 2 diabetes mellitus Patient comes in today for recheck of his diabetes. Patient has been currently taking Jardiance, A1c 6.0 today. Patient is not currently on an ACE inhibitor/ARB. Patient has seen an ophthalmologist this year. Patient denies any new issues with their feet. The symptom started onset as an adult hypertension and CHF and hyperlipidemia and CAD ARE RELATED TO DM   Hypertension and CHF recheck Patient is currently on Bystolic and Jardiance and furosemide, and their blood pressure today is 97/56. Patient denies any lightheadedness or dizziness. Patient denies headaches, blurred vision, chest pains, shortness of breath, or weakness. Denies any side effects from medication and is content with current medication.   Hyperlipidemia and CAD recheck Patient is coming in for recheck of his hyperlipidemia. The patient is currently taking Crestor. They deny any issues with myalgias or history of liver damage from it. They deny any focal numbness or weakness or chest pain.   COPD Patient is coming in for COPD recheck today.  He is currently on albuterol and Breztri.  He has a mild chronic cough but denies any major coughing spells or wheezing spells.  He has 5 nighttime symptoms per week and 5 daytime symptoms per week currently.  He says he is stable where he is at, denies any major issues  His only major issue that he is having currently is that he is having some weight loss.  We think that it could possibly be from the Jardiance decrease in his appetite but also could be from COPD, we also want to do lung cancer screening because of his smoking history.  Relevant past  medical, surgical, family and social history reviewed and updated as indicated. Interim medical history since our last visit reviewed. Allergies and medications reviewed and updated.  Review of Systems  Constitutional:  Positive for appetite change and unexpected weight change. Negative for chills and fever.  Eyes:  Negative for visual disturbance.  Respiratory:  Positive for cough, shortness of breath and wheezing.   Cardiovascular:  Negative for chest pain and leg swelling.  Musculoskeletal:  Negative for back pain and gait problem.  Skin:  Negative for rash.  Neurological:  Negative for dizziness and light-headedness.  All other systems reviewed and are negative.   Per HPI unless specifically indicated above   Allergies as of 12/03/2023   No Known Allergies      Medication List        Accurate as of December 03, 2023  9:09 AM. If you have any questions, ask your nurse or doctor.          Accu-Chek Guide test strip Generic drug: glucose blood USE TO CHECK SUGAR UP TO 4 TIMES DAILY AS DIRECTED   Accu-Chek Softclix Lancets lancets SMARTSIG:Topical 1-4 Times Daily   albuterol 108 (90 Base) MCG/ACT inhaler Commonly known as: VENTOLIN HFA TAKE 2 PUFFS BY MOUTH EVERY 6 HOURS AS NEEDED FOR WHEEZE OR SHORTNESS OF BREATH   apixaban 5 MG Tabs tablet Commonly known as: Eliquis Take 1 tablet (5 mg total) by mouth  2 (two) times daily.   aspirin EC 81 MG tablet Commonly known as: [facility]S Aspirin Low Dose Take 1 tablet (81 mg total) by mouth daily.   b complex vitamins capsule Take 1 capsule by mouth daily.   blood glucose meter kit and supplies Kit Dispense based on patient and insurance preference. Use up to four times daily as directed.   Breztri Aerosphere 160-9-4.8 MCG/ACT Aero Generic drug: Budeson-Glycopyrrol-Formoterol Inhale 2 puffs into the lungs 2 (two) times daily.   diphenhydramine-acetaminophen 25-500 MG Tabs tablet Commonly known as: TYLENOL PM Take 1  tablet by mouth at bedtime as needed (sleep/pain.).   furosemide 40 MG tablet Commonly known as: LASIX Take 1 tablet (40 mg total) by mouth daily.   Jardiance 10 MG Tabs tablet Generic drug: empagliflozin Take 10 mg by mouth daily.   nebivolol 10 MG tablet Commonly known as: BYSTOLIC Take 1 tablet (10 mg total) by mouth daily.   nebivolol 5 MG tablet Commonly known as: BYSTOLIC TAKE 1 TABLET (5 MG TOTAL) BY MOUTH DAILY. WITH 10 MG TABLET TO EQUAL 15 MG DAILY   nitroGLYCERIN 0.4 MG SL tablet Commonly known as: NITROSTAT Place 1 tablet (0.4 mg total) under the tongue every 5 (five) minutes as needed for chest pain.   OVER THE COUNTER MEDICATION Take by mouth daily. preservision   OXYGEN Inhale 4 L into the lungs daily.   potassium chloride SA 20 MEQ tablet Commonly known as: Klor-Con M20 Take 1 tablet (20 mEq total) by mouth daily.   PRESERVISION AREDS 2 PO Take 1 tablet by mouth daily.   rosuvastatin 40 MG tablet Commonly known as: CRESTOR Take 1 tablet (40 mg total) by mouth daily.   Salonpas Pain Relief Patch Ptch Place 1 patch onto the skin daily as needed (pain.).         Objective:   BP (!) 97/56   Pulse 62   Ht 5' 7.5" (1.715 m)   Wt 125 lb (56.7 kg)   SpO2 (!) 73%   BMI 19.29 kg/m   Wt Readings from Last 3 Encounters:  12/03/23 125 lb (56.7 kg)  09/02/23 126 lb 3.2 oz (57.2 kg)  06/16/23 127 lb 6.4 oz (57.8 kg)    Physical Exam Vitals and nursing note reviewed.  Constitutional:      General: He is not in acute distress.    Appearance: He is well-developed. He is not diaphoretic.  Eyes:     General: No scleral icterus.    Conjunctiva/sclera: Conjunctivae normal.  Neck:     Thyroid: No thyromegaly.  Cardiovascular:     Rate and Rhythm: Normal rate and regular rhythm.     Heart sounds: Normal heart sounds. No murmur heard. Pulmonary:     Effort: Pulmonary effort is normal. No respiratory distress.     Breath sounds: Normal breath sounds.  No wheezing or rhonchi.  Musculoskeletal:        General: No swelling. Normal range of motion.     Cervical back: Neck supple.  Lymphadenopathy:     Cervical: No cervical adenopathy.  Skin:    General: Skin is warm and dry.     Findings: No rash.  Neurological:     Mental Status: He is alert and oriented to person, place, and time.     Coordination: Coordination normal.  Psychiatric:        Behavior: Behavior normal.       Assessment & Plan:   Problem List Items Addressed This Visit  Cardiovascular and Mediastinum   Hypertension   Relevant Medications   furosemide (LASIX) 40 MG tablet   rosuvastatin (CRESTOR) 40 MG tablet   Chronic diastolic CHF (congestive heart failure) (HCC)   Relevant Medications   furosemide (LASIX) 40 MG tablet   rosuvastatin (CRESTOR) 40 MG tablet     Respiratory   COPD (chronic obstructive pulmonary disease) (HCC)   Relevant Medications   Budeson-Glycopyrrol-Formoterol (BREZTRI AEROSPHERE) 160-9-4.8 MCG/ACT AERO     Endocrine   Type 2 diabetes mellitus (HCC) - Primary   Relevant Medications   JARDIANCE 10 MG TABS tablet   rosuvastatin (CRESTOR) 40 MG tablet   Other Relevant Orders   Bayer DCA Hb A1c Waived   Microalbumin / creatinine urine ratio     Other   Dyslipidemia   Relevant Medications   rosuvastatin (CRESTOR) 40 MG tablet   Other Visit Diagnoses       Acute on chronic combined systolic and diastolic congestive heart failure (HCC)       Relevant Medications   furosemide (LASIX) 40 MG tablet   potassium chloride SA (KLOR-CON M20) 20 MEQ tablet   rosuvastatin (CRESTOR) 40 MG tablet   Other Relevant Orders   Ambulatory Referral Lung Cancer Screening [facility]     Mixed hyperlipidemia       Relevant Medications   furosemide (LASIX) 40 MG tablet   rosuvastatin (CRESTOR) 40 MG tablet     Smokes less than 1 pack a day with greater than 30 pack year history       Relevant Orders   Ambulatory Referral Lung Cancer  Screening [facility]       Will do lung cancer screening. Concerned about weight loss so clear it with cardiology and they are okay with him stopping the Jardiance.  A1c was good at 6.0 and blood pressure looks good today.  No other changes Follow up plan: Return in about 3 months (around 03/02/2024), or if symptoms worsen or fail to improve, for Diabetes and hypertension and COPD and hyperlipidemia.  Counseling provided for all of the vaccine components Orders Placed This Encounter  Procedures   Bayer DCA Hb A1c Waived   Microalbumin / creatinine urine ratio   Ambulatory Referral Lung Cancer Screening [facility]    Arville Care, MD [facility] 12/03/2023, 9:09 AM

## 2023-12-03 NOTE — Addendum Note (Signed)
Addended by: Arville Care on: 12/03/2023 02:49 PM   Modules accepted: Orders

## 2023-12-06 DIAGNOSIS — J449 Chronic obstructive pulmonary disease, unspecified: Secondary | ICD-10-CM | POA: Diagnosis not present

## 2023-12-07 LAB — MICROALBUMIN / CREATININE URINE RATIO
Creatinine, Urine: 31.9 mg/dL
Microalb/Creat Ratio: 50 mg/g{creat} — ABNORMAL HIGH (ref 0–29)
Microalbumin, Urine: 16 ug/mL

## 2023-12-14 ENCOUNTER — Ambulatory Visit (HOSPITAL_BASED_OUTPATIENT_CLINIC_OR_DEPARTMENT_OTHER): Payer: Medicare HMO

## 2023-12-23 ENCOUNTER — Telehealth: Payer: Self-pay | Admitting: Family Medicine

## 2023-12-23 ENCOUNTER — Ambulatory Visit (HOSPITAL_BASED_OUTPATIENT_CLINIC_OR_DEPARTMENT_OTHER): Payer: Medicare HMO

## 2023-12-23 NOTE — Telephone Encounter (Signed)
 Copied from CRM [id number]. Topic: General - Other >> Dec 23, 2023  8:14 AM Curlee DEL wrote: Reason for CRM: Patient was scheduled for a CT Scan of his chest - the patient does not want to go to get this scan - the patient cancelled the appointment and then, they received a call yesterday that he had an appointment today for the CT Scan - the patient is refusing to go to this appointment and are requesting that we do not make an additional appointments for him.

## 2023-12-23 NOTE — Telephone Encounter (Signed)
 Okay that is fine go ahead and cancel out the CT scans

## 2023-12-23 NOTE — Telephone Encounter (Signed)
 Pt made aware of Dettinger's recommendations.

## 2024-01-06 DIAGNOSIS — J449 Chronic obstructive pulmonary disease, unspecified: Secondary | ICD-10-CM | POA: Diagnosis not present

## 2024-01-18 DIAGNOSIS — M79672 Pain in left foot: Secondary | ICD-10-CM | POA: Diagnosis not present

## 2024-01-18 DIAGNOSIS — L97521 Non-pressure chronic ulcer of other part of left foot limited to breakdown of skin: Secondary | ICD-10-CM | POA: Diagnosis not present

## 2024-01-27 DIAGNOSIS — L97511 Non-pressure chronic ulcer of other part of right foot limited to breakdown of skin: Secondary | ICD-10-CM | POA: Diagnosis not present

## 2024-02-06 DIAGNOSIS — J449 Chronic obstructive pulmonary disease, unspecified: Secondary | ICD-10-CM | POA: Diagnosis not present

## 2024-02-10 DIAGNOSIS — J4489 Other specified chronic obstructive pulmonary disease: Secondary | ICD-10-CM | POA: Diagnosis not present

## 2024-02-10 DIAGNOSIS — E1151 Type 2 diabetes mellitus with diabetic peripheral angiopathy without gangrene: Secondary | ICD-10-CM | POA: Diagnosis not present

## 2024-02-10 DIAGNOSIS — M1990 Unspecified osteoarthritis, unspecified site: Secondary | ICD-10-CM | POA: Diagnosis not present

## 2024-02-10 DIAGNOSIS — Z7984 Long term (current) use of oral hypoglycemic drugs: Secondary | ICD-10-CM | POA: Diagnosis not present

## 2024-02-10 DIAGNOSIS — N529 Male erectile dysfunction, unspecified: Secondary | ICD-10-CM | POA: Diagnosis not present

## 2024-02-10 DIAGNOSIS — I252 Old myocardial infarction: Secondary | ICD-10-CM | POA: Diagnosis not present

## 2024-02-10 DIAGNOSIS — I129 Hypertensive chronic kidney disease with stage 1 through stage 4 chronic kidney disease, or unspecified chronic kidney disease: Secondary | ICD-10-CM | POA: Diagnosis not present

## 2024-02-10 DIAGNOSIS — Z8249 Family history of ischemic heart disease and other diseases of the circulatory system: Secondary | ICD-10-CM | POA: Diagnosis not present

## 2024-02-10 DIAGNOSIS — Z7901 Long term (current) use of anticoagulants: Secondary | ICD-10-CM | POA: Diagnosis not present

## 2024-02-10 DIAGNOSIS — E785 Hyperlipidemia, unspecified: Secondary | ICD-10-CM | POA: Diagnosis not present

## 2024-02-10 DIAGNOSIS — I82509 Chronic embolism and thrombosis of unspecified deep veins of unspecified lower extremity: Secondary | ICD-10-CM | POA: Diagnosis not present

## 2024-02-10 DIAGNOSIS — E1122 Type 2 diabetes mellitus with diabetic chronic kidney disease: Secondary | ICD-10-CM | POA: Diagnosis not present

## 2024-02-17 DIAGNOSIS — L97521 Non-pressure chronic ulcer of other part of left foot limited to breakdown of skin: Secondary | ICD-10-CM | POA: Diagnosis not present

## 2024-03-02 ENCOUNTER — Ambulatory Visit: Payer: Medicare HMO | Admitting: Family Medicine

## 2024-03-02 ENCOUNTER — Encounter: Payer: Self-pay | Admitting: Family Medicine

## 2024-03-02 VITALS — BP 113/60 | HR 60 | Ht 67.5 in | Wt 132.0 lb

## 2024-03-02 DIAGNOSIS — E785 Hyperlipidemia, unspecified: Secondary | ICD-10-CM

## 2024-03-02 DIAGNOSIS — I5032 Chronic diastolic (congestive) heart failure: Secondary | ICD-10-CM | POA: Diagnosis not present

## 2024-03-02 DIAGNOSIS — I480 Paroxysmal atrial fibrillation: Secondary | ICD-10-CM

## 2024-03-02 DIAGNOSIS — E1159 Type 2 diabetes mellitus with other circulatory complications: Secondary | ICD-10-CM

## 2024-03-02 DIAGNOSIS — I2510 Atherosclerotic heart disease of native coronary artery without angina pectoris: Secondary | ICD-10-CM

## 2024-03-02 DIAGNOSIS — I10 Essential (primary) hypertension: Secondary | ICD-10-CM

## 2024-03-02 DIAGNOSIS — E1169 Type 2 diabetes mellitus with other specified complication: Secondary | ICD-10-CM | POA: Diagnosis not present

## 2024-03-02 DIAGNOSIS — E782 Mixed hyperlipidemia: Secondary | ICD-10-CM

## 2024-03-02 DIAGNOSIS — I110 Hypertensive heart disease with heart failure: Secondary | ICD-10-CM | POA: Diagnosis not present

## 2024-03-02 LAB — BAYER DCA HB A1C WAIVED: HB A1C (BAYER DCA - WAIVED): 5.9 % — ABNORMAL HIGH (ref 4.8–5.6)

## 2024-03-02 MED ORDER — ALBUTEROL SULFATE HFA 108 (90 BASE) MCG/ACT IN AERS
2.0000 | INHALATION_SPRAY | Freq: Four times a day (QID) | RESPIRATORY_TRACT | 1 refills | Status: AC | PRN
Start: 2024-03-02 — End: ?

## 2024-03-02 MED ORDER — APIXABAN 5 MG PO TABS: 5.0000 mg | ORAL_TABLET | Freq: Two times a day (BID) | ORAL | 3 refills | Status: DC

## 2024-03-02 NOTE — Progress Notes (Signed)
 BP 113/60   Pulse 60   Ht 5' 7.5" (1.715 m)   Wt 132 lb (59.9 kg)   SpO2 90%   BMI 20.37 kg/m    Subjective:   [name], male    DOB: 25-Mar-1946, 78 y.o.   MRN: [id number]  HPI: Luis Salinas is a 78 y.o. male presenting on 03/02/2024 for Medical Management of Chronic Issues, Diabetes, Hyperlipidemia, and Hypertension   HPI Type 2 diabetes mellitus Patient comes in today for recheck of his diabetes. Patient has been currently taking no medication, diet control. Patient is not currently on an ACE inhibitor/ARB. Patient has not seen an ophthalmologist this year. Patient denies any new issues with their feet. The symptom started onset as an adult hypertension and hyperlipidemia and A-fib ARE RELATED TO DM   Hypertension and A-fib and CHF and CAD recheck Patient is currently on Eliquis and furosemide and Bystolic, and their blood pressure today is 113/60. Patient denies any lightheadedness or dizziness. Patient denies headaches, blurred vision, chest pains, shortness of breath, or weakness. Denies any side effects from medication and is content with current medication.   Hyperlipidemia Patient is coming in for recheck of his hyperlipidemia. The patient is currently taking Crestor. They deny any issues with myalgias or history of liver damage from it. They deny any focal numbness or weakness or chest pain.   Relevant past medical, surgical, family and social history reviewed and updated as indicated. Interim medical history since our last visit reviewed. Allergies and medications reviewed and updated.  Review of Systems  Constitutional:  Negative for chills and fever.  Eyes:  Negative for discharge and visual disturbance.  Respiratory:  Negative for shortness of breath and wheezing.   Cardiovascular:  Negative for chest pain and leg swelling.  Musculoskeletal:  Negative for back pain and gait problem.  Skin:  Negative for rash.  Neurological:  Negative for  dizziness and light-headedness.  All other systems reviewed and are negative.   Per HPI unless specifically indicated above   Allergies as of 03/02/2024   No Known Allergies      Medication List        Accurate as of March 02, 2024 10:46 AM. If you have any questions, ask your nurse or doctor.          Accu-Chek Guide test strip Generic drug: glucose blood USE TO CHECK SUGAR UP TO 4 TIMES DAILY AS DIRECTED   Accu-Chek Softclix Lancets lancets SMARTSIG:Topical 1-4 Times Daily   albuterol 108 (90 Base) MCG/ACT inhaler Commonly known as: VENTOLIN HFA Inhale 2 puffs into the lungs every 6 (six) hours as needed for wheezing or shortness of breath. What changed: See the new instructions. Changed by: Elige Radon Sanye Ledesma   apixaban 5 MG Tabs tablet Commonly known as: Eliquis Take 1 tablet (5 mg total) by mouth 2 (two) times daily.   aspirin EC 81 MG tablet Commonly known as: [facility]S Aspirin Low Dose Take 1 tablet (81 mg total) by mouth daily.   b complex vitamins capsule Take 1 capsule by mouth daily.   blood glucose meter kit and supplies Kit Dispense based on patient and insurance preference. Use up to four times daily as directed.   Breztri Aerosphere 160-9-4.8 MCG/ACT Aero Generic drug: budeson-glycopyrrolate-formoterol Inhale 2 puffs into the lungs 2 (two) times daily.   diphenhydramine-acetaminophen 25-500 MG Tabs tablet Commonly known as: TYLENOL PM Take 1 tablet by mouth at bedtime as needed (sleep/pain.).   furosemide  40 MG tablet Commonly known as: LASIX Take 1 tablet (40 mg total) by mouth daily.   nebivolol 10 MG tablet Commonly known as: BYSTOLIC Take 1 tablet (10 mg total) by mouth daily.   nebivolol 5 MG tablet Commonly known as: BYSTOLIC TAKE 1 TABLET (5 MG TOTAL) BY MOUTH DAILY. WITH 10 MG TABLET TO EQUAL 15 MG DAILY   nitroGLYCERIN 0.4 MG SL tablet Commonly known as: NITROSTAT Place 1 tablet (0.4 mg total) under the tongue every 5 (five)  minutes as needed for chest pain.   OVER THE COUNTER MEDICATION Take by mouth daily. preservision   OXYGEN Inhale 4 L into the lungs daily.   potassium chloride SA 20 MEQ tablet Commonly known as: Klor-Con M20 Take 1 tablet (20 mEq total) by mouth daily.   PRESERVISION AREDS 2 PO Take 1 tablet by mouth daily.   rosuvastatin 40 MG tablet Commonly known as: CRESTOR Take 1 tablet (40 mg total) by mouth daily.   Salonpas Pain Relief Patch Ptch Place 1 patch onto the skin daily as needed (pain.).         Objective:   BP 113/60   Pulse 60   Ht 5' 7.5" (1.715 m)   Wt 132 lb (59.9 kg)   SpO2 90%   BMI 20.37 kg/m   Wt Readings from Last 3 Encounters:  03/02/24 132 lb (59.9 kg)  12/03/23 125 lb (56.7 kg)  09/02/23 126 lb 3.2 oz (57.2 kg)    Physical Exam Vitals and nursing note reviewed.  Constitutional:      General: He is not in acute distress.    Appearance: He is well-developed. He is not diaphoretic.  Eyes:     General: No scleral icterus.    Conjunctiva/sclera: Conjunctivae normal.  Neck:     Thyroid: No thyromegaly.  Cardiovascular:     Rate and Rhythm: Normal rate and regular rhythm.     Heart sounds: Normal heart sounds. No murmur heard. Pulmonary:     Effort: Pulmonary effort is normal. No respiratory distress.     Breath sounds: Normal breath sounds. No wheezing.  Musculoskeletal:        General: No swelling. Normal range of motion.     Cervical back: Neck supple.  Lymphadenopathy:     Cervical: No cervical adenopathy.  Skin:    General: Skin is warm and dry.     Findings: No rash.  Neurological:     Mental Status: He is alert and oriented to person, place, and time.     Coordination: Coordination normal.  Psychiatric:        Behavior: Behavior normal.       Assessment & Plan:   Problem List Items Addressed This Visit       Cardiovascular and Mediastinum   Hypertension   Relevant Medications   apixaban (ELIQUIS) 5 MG TABS tablet    Other Relevant Orders   CBC with Differential/Platelet   CMP14+EGFR   Lipid panel   Bayer DCA Hb A1c Waived   PSA, total and free   CAD in native artery   Relevant Medications   apixaban (ELIQUIS) 5 MG TABS tablet   Paroxysmal atrial fibrillation (HCC)   Relevant Medications   apixaban (ELIQUIS) 5 MG TABS tablet   Chronic diastolic CHF (congestive heart failure) (HCC)   Relevant Medications   apixaban (ELIQUIS) 5 MG TABS tablet     Endocrine   Type 2 diabetes mellitus (HCC) - Primary   Relevant Orders  CBC with Differential/Platelet   CMP14+EGFR   Lipid panel   Bayer DCA Hb A1c Waived   PSA, total and free     Other   Dyslipidemia   Other Visit Diagnoses       Mixed hyperlipidemia       Relevant Medications   apixaban (ELIQUIS) 5 MG TABS tablet   Other Relevant Orders   CBC with Differential/Platelet   CMP14+EGFR   Lipid panel   Bayer DCA Hb A1c Waived   PSA, total and free     AF (paroxysmal atrial fibrillation) (HCC)       Relevant Medications   apixaban (ELIQUIS) 5 MG TABS tablet     Patient is holding SGLT2 right now addition to this dose.  He has gained weight but he has been trying to gain weight with his nutrition.  He does not feel short of breath or feel like fluid buildup.  His breathing he feels like he is doing well.  No changes.  Follow up plan: Return in about 3 months (around 06/02/2024), or if symptoms worsen or fail to improve, for Diabetes and hypertension and hyperlipidemia.  Counseling provided for all of the vaccine components Orders Placed This Encounter  Procedures   CBC with Differential/Platelet   CMP14+EGFR   Lipid panel   Bayer DCA Hb A1c Waived   PSA, total and free    Arville Care, MD [place] [facility] 03/02/2024, 10:46 AM

## 2024-03-03 LAB — CMP14+EGFR
ALT: 21 IU/L (ref 0–44)
AST: 24 IU/L (ref 0–40)
Albumin: 4.3 g/dL (ref 3.8–4.8)
Alkaline Phosphatase: 93 IU/L (ref 44–121)
BUN/Creatinine Ratio: 14 (ref 10–24)
BUN: 14 mg/dL (ref 8–27)
Bilirubin Total: 0.3 mg/dL (ref 0.0–1.2)
CO2: 26 mmol/L (ref 20–29)
Calcium: 9.4 mg/dL (ref 8.6–10.2)
Chloride: 102 mmol/L (ref 96–106)
Creatinine, Ser: 1.01 mg/dL (ref 0.76–1.27)
Globulin, Total: 2.3 g/dL (ref 1.5–4.5)
Glucose: 117 mg/dL — ABNORMAL HIGH (ref 70–99)
Potassium: 4.1 mmol/L (ref 3.5–5.2)
Sodium: 142 mmol/L (ref 134–144)
Total Protein: 6.6 g/dL (ref 6.0–8.5)
eGFR: 76 mL/min/{1.73_m2} (ref >59)

## 2024-03-03 LAB — CBC WITH DIFFERENTIAL/PLATELET
Basophils Absolute: 0.1 10*3/uL (ref 0.0–0.2)
Basos: 1 %
EOS (ABSOLUTE): 0.3 10*3/uL (ref 0.0–0.4)
Eos: 3 %
Hematocrit: 41.6 % (ref 37.5–51.0)
Hemoglobin: 13.5 g/dL (ref 13.0–17.7)
Immature Grans (Abs): 0 10*3/uL (ref 0.0–0.1)
Immature Granulocytes: 0 %
Lymphocytes Absolute: 2 10*3/uL (ref 0.7–3.1)
Lymphs: 20 %
MCH: 29.3 pg (ref 26.6–33.0)
MCHC: 32.5 g/dL (ref 31.5–35.7)
MCV: 90 fL (ref 79–97)
Monocytes Absolute: 0.7 10*3/uL (ref 0.1–0.9)
Monocytes: 7 %
Neutrophils Absolute: 6.9 10*3/uL (ref 1.4–7.0)
Neutrophils: 69 %
Platelets: 190 10*3/uL (ref 150–450)
RBC: 4.61 x10E6/uL (ref 4.14–5.80)
RDW: 13.3 % (ref 11.6–15.4)
WBC: 10 10*3/uL (ref 3.4–10.8)

## 2024-03-03 LAB — LIPID PANEL
Chol/HDL Ratio: 2.2 ratio (ref 0.0–5.0)
Cholesterol, Total: 100 mg/dL (ref 100–199)
HDL: 46 mg/dL (ref >39)
LDL Chol Calc (NIH): 37 mg/dL (ref 0–99)
Triglycerides: 89 mg/dL (ref 0–149)
VLDL Cholesterol Cal: 17 mg/dL (ref 5–40)

## 2024-03-03 LAB — PSA, TOTAL AND FREE
PSA, Free Pct: 25 %
PSA, Free: 0.35 ng/mL
Prostate Specific Ag, Serum: 1.4 ng/mL (ref 0.0–4.0)

## 2024-03-05 DIAGNOSIS — J449 Chronic obstructive pulmonary disease, unspecified: Secondary | ICD-10-CM | POA: Diagnosis not present

## 2024-03-06 ENCOUNTER — Encounter: Payer: Self-pay | Admitting: Family Medicine

## 2024-03-09 DIAGNOSIS — L97521 Non-pressure chronic ulcer of other part of left foot limited to breakdown of skin: Secondary | ICD-10-CM | POA: Diagnosis not present

## 2024-03-09 DIAGNOSIS — I70203 Unspecified atherosclerosis of native arteries of extremities, bilateral legs: Secondary | ICD-10-CM | POA: Diagnosis not present

## 2024-03-23 DIAGNOSIS — L97521 Non-pressure chronic ulcer of other part of left foot limited to breakdown of skin: Secondary | ICD-10-CM | POA: Diagnosis not present

## 2024-03-31 ENCOUNTER — Other Ambulatory Visit (HOSPITAL_COMMUNITY): Payer: Self-pay | Admitting: Podiatry

## 2024-03-31 DIAGNOSIS — L97511 Non-pressure chronic ulcer of other part of right foot limited to breakdown of skin: Secondary | ICD-10-CM

## 2024-03-31 DIAGNOSIS — M79671 Pain in right foot: Secondary | ICD-10-CM

## 2024-04-05 DIAGNOSIS — J449 Chronic obstructive pulmonary disease, unspecified: Secondary | ICD-10-CM | POA: Diagnosis not present

## 2024-04-12 ENCOUNTER — Ambulatory Visit (HOSPITAL_COMMUNITY)
Admission: RE | Admit: 2024-04-12 | Discharge: 2024-04-12 | Disposition: A | Discharge status: Home / Self Care | Source: Ambulatory Visit | Attending: Podiatry | Admitting: Podiatry

## 2024-04-12 DIAGNOSIS — I739 Peripheral vascular disease, unspecified: Secondary | ICD-10-CM | POA: Diagnosis not present

## 2024-04-12 DIAGNOSIS — M79671 Pain in right foot: Secondary | ICD-10-CM | POA: Insufficient documentation

## 2024-04-12 DIAGNOSIS — L97529 Non-pressure chronic ulcer of other part of left foot with unspecified severity: Secondary | ICD-10-CM | POA: Diagnosis not present

## 2024-04-12 DIAGNOSIS — L97511 Non-pressure chronic ulcer of other part of right foot limited to breakdown of skin: Secondary | ICD-10-CM | POA: Diagnosis not present

## 2024-04-13 DIAGNOSIS — I70203 Unspecified atherosclerosis of native arteries of extremities, bilateral legs: Secondary | ICD-10-CM | POA: Diagnosis not present

## 2024-04-13 DIAGNOSIS — L97521 Non-pressure chronic ulcer of other part of left foot limited to breakdown of skin: Secondary | ICD-10-CM | POA: Diagnosis not present

## 2024-04-18 ENCOUNTER — Ambulatory Visit: Attending: Vascular Surgery | Admitting: Vascular Surgery

## 2024-04-18 ENCOUNTER — Encounter: Payer: Self-pay | Admitting: Vascular Surgery

## 2024-04-18 ENCOUNTER — Other Ambulatory Visit: Payer: Self-pay

## 2024-04-18 VITALS — BP 99/57 | HR 63 | Temp 97.7°F | Ht 67.0 in | Wt 128.0 lb

## 2024-04-18 DIAGNOSIS — I70245 Atherosclerosis of native arteries of left leg with ulceration of other part of foot: Secondary | ICD-10-CM

## 2024-04-18 DIAGNOSIS — I739 Peripheral vascular disease, unspecified: Secondary | ICD-10-CM

## 2024-04-18 NOTE — Progress Notes (Signed)
 [facility] [place]  ASSESSMENT / PLAN: Luis Salinas is a 78 y.o. male with atherosclerosis of native arteries of left lower extremity causing ischemic rest pain and fourth toe interdigital ulceration.  Recommend:  Abstinence from all tobacco products. Blood glucose control with goal A1c < 7%. Blood pressure control with goal blood pressure < 140/90 mmHg. Lipid reduction therapy with goal LDL-C <100 mg/dL  Aspirin  81mg  PO QD.  Atorvastatin  40-80mg  PO QD (or other "high intensity" statin therapy).  Weak right femoral pulse, but it is palpable.  Plan left lower extremity angiogram with possible intervention via right femoral access in cath lab Friday, 04/21/2024.   CHIEF COMPLAINT: Painful left fourth toe ulcer  HISTORY OF PRESENT ILLNESS: Luis Salinas is a 78 y.o. male who presents to clinic for evaluation of painful left fourth toe interdigital ulceration.  This has been present for about 2 months.  Local wound care has not been effective at trying to heal the ulcer.  The patient reports constant severe pain about the ulcer which is bothersome to him.  Prior to the ulcer forming, he does describe fairly typical claudication symptoms.  He is an active smoker, and has been his whole life.  We reviewed his noninvasive testing in detail.  I counseled him about the limb-threatening nature of his diagnosis, and the necessity of proceeding with angiography.  VASCULAR SURGICAL HISTORY: None  VASCULAR RISK FACTORS: Positive history of stroke / transient ischemic attack. Positive history of coronary artery disease. + history of PCI.  "Borderline" diabetes mellitus. Luis Salinas Positive history of smoking. + actively smoking. Positive history of hypertension.  Negative history of chronic kidney disease.   Positive history of chronic obstructive pulmonary disease.  On home oxygen  therapy. Patient on Eliquis  for chronic atrial fibrillation  FUNCTIONAL STATUS: ECOG performance  status: (2) Ambulatory and capable of self care, unable to carry out work activity, up and about > 50% or waking hours Ambulatory status: Ambulatory within the community with limits  Luis Salinas AND 3 YEAR INDEX Male (2pts) 75-79 or 80-84 (2pts) >84 (3pts) Dependence in toileting (1pt) Partial or full dependence in dressing (1pt) History of malignant neoplasm (2pts) CHF (3pts) COPD (1pts) CKD (3pts)  0-3 pts 6% Salinas year mortality ; 21% 3 year mortality 4-5 pts 12% Salinas year mortality ; 36% 3 year mortality >5 pts 21% Salinas year mortality; 54% 3 year mortality   Past Medical History:  Diagnosis Date   Amnesia 1967   Was in a coma and had no memory of the past. His family found him after 27 years   Arthritis    Cataract    Chronic diastolic CHF (congestive heart failure) (HCC)    COPD (chronic obstructive pulmonary disease) (HCC)    Dyslipidemia    History of concussion    History of STEMI    a. 11/2007 s/p DES to LCX at [facility]   Hypertension    Paroxysmal atrial fibrillation (HCC)    Pneumonia    exposure to caustic chemicals   Stroke Luis Salinas)    multiple strokes   Tobacco abuse    Type 2 diabetes mellitus (HCC)     Past Surgical History:  Procedure Laterality Date   ANTERIOR CERVICAL DECOMP/DISCECTOMY FUSION  05/16/2012   Procedure: ANTERIOR CERVICAL DECOMPRESSION/DISCECTOMY FUSION 2 LEVELS;  Surgeon: Elna Haggis, MD;  Location: [facility];  Service: Neurosurgery;  Laterality: N/A;  Cervical Five-Six, Cervical Six-Seven Anterior Cervical Decompression/Diskectomy Fusion   CARDIAC CATHETERIZATION  CARDIOVERSION N/A 11/10/2021   Procedure: CARDIOVERSION;  Surgeon: Loyde Rule, MD;  Location: [facility] ENDOSCOPY;  Service: Cardiovascular;  Laterality: N/A;   COLONOSCOPY W/ POLYPECTOMY     CORONARY ANGIOPLASTY     EYE SURGERY     cataracts   JOINT REPLACEMENT     Left knee replacement   JOINT REPLACEMENT     left elbow at age 50   ROTATOR CUFF REPAIR     left   TONSILLECTOMY       Family History  Problem Relation Age of Onset   Heart disease Mother    COPD Father    Aneurysm Father    Anesthesia problems Neg Hx    Hypotension Neg Hx    Malignant hyperthermia Neg Hx    Pseudochol deficiency Neg Hx     Social History   Socioeconomic History   Marital status: Married    Spouse name: Luis Salinas   Number of children: 7   Years of education: college   Highest education level: Some college, no degree  Occupational History   Occupation: Retired    Comment: Proofreader  Tobacco Use   Smoking status: Light Smoker    Types: Cigars    Last attempt to quit: 04/03/2013    Years since quitting: 11.0   Smokeless tobacco: Never   Tobacco comments:    Salinas-2 cigars per day.  Smoked 40 years cigarettes.  10 years ago quit cigarettes.  Vaping Use   Vaping status: Never Used  Substance and Sexual Activity   Alcohol use: No    Alcohol/week: 0.0 standard drinks of alcohol   Drug use: Yes    Types: Marijuana    Comment: offer he will smoke 01/20/22   Sexual activity: Yes  Other Topics Concern   Not on file  Social History Narrative   Lives with his fourth wife.    Says his family found him in 1992 and he had no recollection of his childhood -believes to have had several head injuries    Social Drivers of Health   Financial Resource Strain: Low Risk  (06/08/2023)   Overall Financial Resource Strain (CARDIA)    Difficulty of Paying Living Expenses: Not hard at all  Food Insecurity: No Food Insecurity (06/08/2023)   Hunger Vital Sign    Worried About Running Out of Food in the Last Year: Never true    Ran Out of Food in the Last Year: Never true  Transportation Needs: No Transportation Needs (06/08/2023)   PRAPARE - Administrator, Civil Service (Medical): No    Lack of Transportation (Non-Medical): No  Physical Activity: Insufficiently Active (06/08/2023)   Exercise Vital Sign    Days of Exercise per Week: 2 days    Minutes of Exercise per Session: 20 min   Stress: No Stress Concern Present (06/08/2023)   [employer] of Occupational Health - Occupational Stress Questionnaire    Feeling of Stress : Not at all  Social Connections: Socially Integrated (06/08/2023)   Social Connection and Isolation Panel [NHANES]    Frequency of Communication with Friends and Family: More than three times a week    Frequency of Social Gatherings with Friends and Family: More than three times a week    Attends Religious Services: Salinas to 4 times per year    Active Member of [employer] or Organizations: Yes    Attends Engineer, structural: More than 4 times per year    Marital Status: Married  Catering manager  Violence: Not At Risk (06/08/2023)   Humiliation, Afraid, Rape, and Kick questionnaire    Fear of Current or Ex-Partner: No    Emotionally Abused: No    Physically Abused: No    Sexually Abused: No    No Known Allergies  Current Outpatient Medications  Medication Sig Dispense Refill   ACCU-CHEK GUIDE test strip USE TO CHECK SUGAR UP TO 4 TIMES DAILY AS DIRECTED     Accu-Chek Softclix Lancets lancets SMARTSIG:Topical Salinas-4 Times Daily     albuterol  (VENTOLIN  HFA) 108 (90 Base) MCG/ACT inhaler Inhale 2 puffs into the lungs every 6 (six) hours as needed for wheezing or shortness of breath. 18 each Salinas   apixaban  (ELIQUIS ) 5 MG TABS tablet Take Salinas tablet (5 mg total) by mouth 2 (two) times daily. 180 tablet 3   aspirin  EC ([facility]S ASPIRIN  LOW DOSE) 81 MG tablet Take Salinas tablet (81 mg total) by mouth daily. 90 tablet 3   b complex vitamins capsule Take Salinas capsule by mouth daily.     blood glucose meter kit and supplies KIT Dispense based on patient and insurance preference. Use up to four times daily as directed. Salinas each 0   Budeson-Glycopyrrol-Formoterol  (BREZTRI  AEROSPHERE) 160-9-4.8 MCG/ACT AERO Inhale 2 puffs into the lungs 2 (two) times daily. 10.7 g 11   diphenhydramine-acetaminophen  (TYLENOL  PM) 25-500 MG TABS tablet Take Salinas tablet by mouth at bedtime as needed  (sleep/pain.).     furosemide  (LASIX ) 40 MG tablet Take Salinas tablet (40 mg total) by mouth daily. 90 tablet 3   Menthol -Methyl Salicylate (SALONPAS PAIN RELIEF PATCH) PTCH Place Salinas patch onto the skin daily as needed (pain.).     Multiple Vitamins-Minerals (PRESERVISION AREDS 2 PO) Take Salinas tablet by mouth daily.     nebivolol  (BYSTOLIC ) 10 MG tablet Take Salinas tablet (10 mg total) by mouth daily. 90 tablet 3   nebivolol  (BYSTOLIC ) 5 MG tablet TAKE Salinas TABLET (5 MG TOTAL) BY MOUTH DAILY. WITH 10 MG TABLET TO EQUAL 15 MG DAILY 90 tablet 3   nitroGLYCERIN  (NITROSTAT ) 0.4 MG SL tablet Place Salinas tablet (0.4 mg total) under the tongue every 5 (five) minutes as needed for chest pain. 50 tablet 3   OVER THE COUNTER MEDICATION Take by mouth daily. preservision     OXYGEN  Inhale 4 L into the lungs daily.     potassium chloride  SA (KLOR-CON  M20) 20 MEQ tablet Take Salinas tablet (20 mEq total) by mouth daily. 90 tablet 3   rosuvastatin  (CRESTOR ) 40 MG tablet Take Salinas tablet (40 mg total) by mouth daily. 90 tablet 3   No current facility-administered medications for this visit.    PHYSICAL EXAM Vitals:   04/18/24 1508  BP: (!) 99/57  Pulse: 63  Temp: 97.7 F (36.5 C)  SpO2: (!) 88%  Weight: 128 lb (58.Salinas kg)  Height: 5\' 7"  (Salinas.702 m)   Elderly man in no distress Sarcopenia Regular rate and rhythm Unlabored breathing; barrel chest Soft abdomen Right femoral pulse Salinas+ Absent left femoral pulse Absent pedal pulses bilaterally  PERTINENT LABORATORY AND RADIOLOGIC DATA  Most recent CBC    Latest Ref Rng & Units 03/02/2024   10:19 AM 09/02/2023   10:54 AM 06/02/2023   11:24 AM  CBC  WBC 3.4 - 10.8 x10E3/uL 10.0  10.6  11.6   Hemoglobin 13.0 - 17.7 g/dL 16.Salinas  09.6  04.5   Hematocrit 37.5 - 51.0 % 41.6  46.4  46.9   Platelets 150 - 450 x10E3/uL  190  176  184      Most recent CMP    Latest Ref Rng & Units 03/02/2024   10:19 AM 09/02/2023   10:54 AM 06/02/2023   11:24 AM  CMP  Glucose 70 - 99 mg/dL 161  96  89    BUN 8 - 27 mg/dL 14  20  14    Creatinine 0.76 - Salinas.27 mg/dL 0.96  0.45  4.09   Sodium 134 - 144 mmol/L 142  142  141   Potassium 3.5 - 5.2 mmol/L 4.Salinas  4.3  4.3   Chloride 96 - 106 mmol/L 102  102  100   CO2 20 - 29 mmol/L 26  27  28    Calcium  8.6 - 10.2 mg/dL 9.4  9.5  9.5   Total Protein 6.0 - 8.5 g/dL 6.6  6.8  6.6   Total Bilirubin 0.0 - Salinas.2 mg/dL 0.3  0.3  0.3   Alkaline Phos 44 - 121 IU/L 93  104  112   AST 0 - 40 IU/L 24  20  23    ALT 0 - 44 IU/L 21  20  22      Renal function CrCl cannot be calculated (Patient's most recent lab result is older than the maximum 21 days allowed.).  HB A1C (BAYER DCA - WAIVED) (%)  Date Value  03/02/2024 5.9 (H)    LDL (calc)  Date Value Ref Range Status  05/01/2013 52 <100 mg/dL Final    Comment:    LDL-C is inaccurate if patient is nonfasting.   Reference Range: ---------------- Optimal:            <100 Near/Above Optimal: 100-129 Borderline High:    130-159 High:               160-189 Very High:          >=190       LDL Chol Calc (NIH)  Date Value Ref Range Status  03/02/2024 37 0 - 99 mg/dL Final   LDL-C  Date Value Ref Range Status  03/29/2015 41 0 - 99 mg/dL Final    Comment:                              Optimal               <  100                           Above optimal     100 -  129                           Borderline        130 -  159                           High              160 -  189                           Very high             >  189 LDL-C is inaccurate if patient is non-fasting.     CLINICAL DATA:  Nonhealing ulcer of the left third toe with foot pain for 6-7  weeks. Claudication.   EXAM: NONINVASIVE PHYSIOLOGIC VASCULAR STUDY OF BILATERAL LOWER EXTREMITIES   TECHNIQUE: Non-invasive vascular evaluation of both lower extremities was performed at rest, including calculation of ankle-brachial indices, multiple segmental pressure evaluation, segmental Doppler and segmental pulse volume recording.    COMPARISON:  None available.   FINDINGS: Right Lower Extremity   Resting ABI:  0.48   Resting TBI: 0.76   Segmental Pressures: Significant gradient noted between low thigh and calf with low thigh pressure measuring 69 mm Hg and calf pressure measuring 47 mm Hg.   Great toe pressure: 83 mmHg   Arterial Waveforms: Diffuse monophasic waveforms at femoral, superficial femoral, popliteal, posterior tibial, dorsalis pedis arteries.   PVRs: Diffusely dampened pulse volume waveform seen throughout the right lower extremity.   Left Lower Extremity:   Resting ABI: 0.32   Resting TBI: 0.21   Segmental Pressures: Significant decrease in pressure between calf and ankle with calf measuring 60 mm Hg in the dorsalis pedis artery pressure measuring 35 mm Hg.   Great toe pressure: 21 mmHg   Arterial Waveforms: Diffusely monophasic waveforms seen throughout the left lower extremity arteries including the femoral, superficial femoral, popliteal, dorsalis pedis arteries. Posterior tibial artery waveform is not detected.   PVRs: Diffusely dampened pulse volume waveform seen throughout the left lower extremity.   Other: Symmetric upper extremity pressures.   Ankle Brachial index   > Salinas.4 Non diagnostic secondary to incompressible vessel calcifications   Salinas.0-Salinas.4       Normal   0.9-0.99     Borderline PAD   0.8-0.89     Mild PAD   0.5-0.79     Moderate PAD   < 0.5          Severe PAD   Toe Brachial Index   Normal     >0.65   Moderate  0.53-0.64   Severe     <0.23   Toe Pressures   Absolute toe pressure >40mmHg sufficient for wound healing.   Toe pressures <39mmHg = critical limb ischemia.   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 Bilateral pressure gradients indicate the presence of both inflow and outflow disease. Further evaluation CT angiography of the abdominal aorta and  lower extremities would be beneficial.     Electronically Signed   By: Elester Grim M.D.   On: 04/12/2024 14:29  Heber Little. Edgardo Goodwill, MD FACS [facility] Phone Number: [phone] 04/18/2024 5:09 PM   Total time spent on preparing this encounter including chart review, data review, collecting history, examining the patient, and coordinating care: 60 minutes.   Portions of this report may have been transcribed using voice recognition software.  Every effort has been made to ensure accuracy; however, inadvertent computerized transcription errors may still be present.

## 2024-04-18 NOTE — H&P (View-Only) (Signed)
 [facility] OF [place]  ASSESSMENT / PLAN: MAISON STANO is a 78 y.o. male with atherosclerosis of native arteries of left lower extremity causing ischemic rest pain and fourth toe interdigital ulceration.  Recommend:  Abstinence from all tobacco products. Blood glucose control with goal A1c < 7%. Blood pressure control with goal blood pressure < 140/90 mmHg. Lipid reduction therapy with goal LDL-C <100 mg/dL  Aspirin  81mg  PO QD.  Atorvastatin  40-80mg  PO QD (or other "high intensity" statin therapy).  Weak right femoral pulse, but it is palpable.  Plan left lower extremity angiogram with possible intervention via right femoral access in cath lab Friday, 04/21/2024.   CHIEF COMPLAINT: Painful left fourth toe ulcer  HISTORY OF PRESENT ILLNESS: Luis Salinas is a 78 y.o. male who presents to clinic for evaluation of painful left fourth toe interdigital ulceration.  This has been present for about 2 months.  Local wound care has not been effective at trying to heal the ulcer.  The patient reports constant severe pain about the ulcer which is bothersome to him.  Prior to the ulcer forming, he does describe fairly typical claudication symptoms.  He is an active smoker, and has been his whole life.  We reviewed his noninvasive testing in detail.  I counseled him about the limb-threatening nature of his diagnosis, and the necessity of proceeding with angiography.  VASCULAR SURGICAL HISTORY: None  VASCULAR RISK FACTORS: Positive history of stroke / transient ischemic attack. Positive history of coronary artery disease. + history of PCI.  "Borderline" diabetes mellitus. [name] Positive history of smoking. + actively smoking. Positive history of hypertension.  Negative history of chronic kidney disease.   Positive history of chronic obstructive pulmonary disease.  On home oxygen  therapy. Patient on Eliquis  for chronic atrial fibrillation  FUNCTIONAL STATUS: ECOG performance  status: (2) Ambulatory and capable of self care, unable to carry out work activity, up and about > 50% or waking hours Ambulatory status: Ambulatory within the community with limits  Luis Salinas 1 AND 3 YEAR INDEX Male (2pts) 75-79 or 80-84 (2pts) >84 (3pts) Dependence in toileting (1pt) Partial or full dependence in dressing (1pt) History of malignant neoplasm (2pts) CHF (3pts) COPD (1pts) CKD (3pts)  0-3 pts 6% 1 year mortality ; 21% 3 year mortality 4-5 pts 12% 1 year mortality ; 36% 3 year mortality >5 pts 21% 1 year mortality; 54% 3 year mortality   Past Medical History:  Diagnosis Date   Amnesia 1967   Was in a coma and had no memory of the past. His family found him after 27 years   Arthritis    Cataract    Chronic diastolic CHF (congestive heart failure) (HCC)    COPD (chronic obstructive pulmonary disease) (HCC)    Dyslipidemia    History of concussion    History of STEMI    a. 11/2007 s/p DES to LCX at [facility]   Hypertension    Paroxysmal atrial fibrillation (HCC)    Pneumonia    exposure to caustic chemicals   Stroke [facility])    multiple strokes   Tobacco abuse    Type 2 diabetes mellitus (HCC)     Past Surgical History:  Procedure Laterality Date   ANTERIOR CERVICAL DECOMP/DISCECTOMY FUSION  05/16/2012   Procedure: ANTERIOR CERVICAL DECOMPRESSION/DISCECTOMY FUSION 2 LEVELS;  Surgeon: Elna Haggis, MD;  Location: [facility];  Service: Neurosurgery;  Laterality: N/A;  Cervical Five-Six, Cervical Six-Seven Anterior Cervical Decompression/Diskectomy Fusion   CARDIAC CATHETERIZATION  CARDIOVERSION N/A 11/10/2021   Procedure: CARDIOVERSION;  Surgeon: Loyde Rule, MD;  Location: [facility] ENDOSCOPY;  Service: Cardiovascular;  Laterality: N/A;   COLONOSCOPY W/ POLYPECTOMY     CORONARY ANGIOPLASTY     EYE SURGERY     cataracts   JOINT REPLACEMENT     Left knee replacement   JOINT REPLACEMENT     left elbow at age 50   ROTATOR CUFF REPAIR     left   TONSILLECTOMY       Family History  Problem Relation Age of Onset   Heart disease Mother    COPD Father    Aneurysm Father    Anesthesia problems Neg Hx    Hypotension Neg Hx    Malignant hyperthermia Neg Hx    Pseudochol deficiency Neg Hx     Social History   Socioeconomic History   Marital status: Married    Spouse name: Corbin Dess   Number of children: 7   Years of education: college   Highest education level: Some college, no degree  Occupational History   Occupation: Retired    Comment: Proofreader  Tobacco Use   Smoking status: Light Smoker    Types: Cigars    Last attempt to quit: 04/03/2013    Years since quitting: 11.0   Smokeless tobacco: Never   Tobacco comments:    1-2 cigars per day.  Smoked 40 years cigarettes.  10 years ago quit cigarettes.  Vaping Use   Vaping status: Never Used  Substance and Sexual Activity   Alcohol use: No    Alcohol/week: 0.0 standard drinks of alcohol   Drug use: Yes    Types: Marijuana    Comment: offer he will smoke 01/20/22   Sexual activity: Yes  Other Topics Concern   Not on file  Social History Narrative   Lives with his fourth wife.    Says his family found him in 1992 and he had no recollection of his childhood -believes to have had several head injuries    Social Drivers of Health   Financial Resource Strain: Low Risk  (06/08/2023)   Overall Financial Resource Strain (CARDIA)    Difficulty of Paying Living Expenses: Not hard at all  Food Insecurity: No Food Insecurity (06/08/2023)   Hunger Vital Sign    Worried About Running Out of Food in the Last Year: Never true    Ran Out of Food in the Last Year: Never true  Transportation Needs: No Transportation Needs (06/08/2023)   PRAPARE - Administrator, Civil Service (Medical): No    Lack of Transportation (Non-Medical): No  Physical Activity: Insufficiently Active (06/08/2023)   Exercise Vital Sign    Days of Exercise per Week: 2 days    Minutes of Exercise per Session: 20 min   Stress: No Stress Concern Present (06/08/2023)   [employer] of Occupational Health - Occupational Stress Questionnaire    Feeling of Stress : Not at all  Social Connections: Socially Integrated (06/08/2023)   Social Connection and Isolation Panel [NHANES]    Frequency of Communication with Friends and Family: More than three times a week    Frequency of Social Gatherings with Friends and Family: More than three times a week    Attends Religious Services: 1 to 4 times per year    Active Member of [employer] or Organizations: Yes    Attends Engineer, structural: More than 4 times per year    Marital Status: Married  Catering manager  Violence: Not At Risk (06/08/2023)   Humiliation, Afraid, Rape, and Kick questionnaire    Fear of Current or Ex-Partner: No    Emotionally Abused: No    Physically Abused: No    Sexually Abused: No    No Known Allergies  Current Outpatient Medications  Medication Sig Dispense Refill   ACCU-CHEK GUIDE test strip USE TO CHECK SUGAR UP TO 4 TIMES DAILY AS DIRECTED     Accu-Chek Softclix Lancets lancets SMARTSIG:Topical 1-4 Times Daily     albuterol  (VENTOLIN  HFA) 108 (90 Base) MCG/ACT inhaler Inhale 2 puffs into the lungs every 6 (six) hours as needed for wheezing or shortness of breath. 18 each 1   apixaban  (ELIQUIS ) 5 MG TABS tablet Take 1 tablet (5 mg total) by mouth 2 (two) times daily. 180 tablet 3   aspirin  EC ([facility] ASPIRIN  LOW DOSE) 81 MG tablet Take 1 tablet (81 mg total) by mouth daily. 90 tablet 3   b complex vitamins capsule Take 1 capsule by mouth daily.     blood glucose meter kit and supplies KIT Dispense based on patient and insurance preference. Use up to four times daily as directed. 1 each 0   Budeson-Glycopyrrol-Formoterol  (BREZTRI  AEROSPHERE) 160-9-4.8 MCG/ACT AERO Inhale 2 puffs into the lungs 2 (two) times daily. 10.7 g 11   diphenhydramine-acetaminophen  (TYLENOL  PM) 25-500 MG TABS tablet Take 1 tablet by mouth at bedtime as needed  (sleep/pain.).     furosemide  (LASIX ) 40 MG tablet Take 1 tablet (40 mg total) by mouth daily. 90 tablet 3   Menthol -Methyl Salicylate (SALONPAS PAIN RELIEF PATCH) PTCH Place 1 patch onto the skin daily as needed (pain.).     Multiple Vitamins-Minerals (PRESERVISION AREDS 2 PO) Take 1 tablet by mouth daily.     nebivolol  (BYSTOLIC ) 10 MG tablet Take 1 tablet (10 mg total) by mouth daily. 90 tablet 3   nebivolol  (BYSTOLIC ) 5 MG tablet TAKE 1 TABLET (5 MG TOTAL) BY MOUTH DAILY. WITH 10 MG TABLET TO EQUAL 15 MG DAILY 90 tablet 3   nitroGLYCERIN  (NITROSTAT ) 0.4 MG SL tablet Place 1 tablet (0.4 mg total) under the tongue every 5 (five) minutes as needed for chest pain. 50 tablet 3   OVER THE COUNTER MEDICATION Take by mouth daily. preservision     OXYGEN  Inhale 4 L into the lungs daily.     potassium chloride  SA (KLOR-CON  M20) 20 MEQ tablet Take 1 tablet (20 mEq total) by mouth daily. 90 tablet 3   rosuvastatin  (CRESTOR ) 40 MG tablet Take 1 tablet (40 mg total) by mouth daily. 90 tablet 3   No current facility-administered medications for this visit.    PHYSICAL EXAM Vitals:   04/18/24 1508  BP: (!) 99/57  Pulse: 63  Temp: 97.7 F (36.5 C)  SpO2: (!) 88%  Weight: 128 lb (58.1 kg)  Height: 5\' 7"  (1.702 m)   Elderly man in no distress Sarcopenia Regular rate and rhythm Unlabored breathing; barrel chest Soft abdomen Right femoral pulse 1+ Absent left femoral pulse Absent pedal pulses bilaterally  PERTINENT LABORATORY AND RADIOLOGIC DATA  Most recent CBC    Latest Ref Rng & Units 03/02/2024   10:19 AM 09/02/2023   10:54 AM 06/02/2023   11:24 AM  CBC  WBC 3.4 - 10.8 x10E3/uL 10.0  10.6  11.6   Hemoglobin 13.0 - 17.7 g/dL 16.1  09.6  04.5   Hematocrit 37.5 - 51.0 % 41.6  46.4  46.9   Platelets 150 - 450 x10E3/uL  190  176  184      Most recent CMP    Latest Ref Rng & Units 03/02/2024   10:19 AM 09/02/2023   10:54 AM 06/02/2023   11:24 AM  CMP  Glucose 70 - 99 mg/dL 161  96  89    BUN 8 - 27 mg/dL 14  20  14    Creatinine 0.76 - 1.27 mg/dL 0.96  0.45  4.09   Sodium 134 - 144 mmol/L 142  142  141   Potassium 3.5 - 5.2 mmol/L 4.1  4.3  4.3   Chloride 96 - 106 mmol/L 102  102  100   CO2 20 - 29 mmol/L 26  27  28    Calcium  8.6 - 10.2 mg/dL 9.4  9.5  9.5   Total Protein 6.0 - 8.5 g/dL 6.6  6.8  6.6   Total Bilirubin 0.0 - 1.2 mg/dL 0.3  0.3  0.3   Alkaline Phos 44 - 121 IU/L 93  104  112   AST 0 - 40 IU/L 24  20  23    ALT 0 - 44 IU/L 21  20  22      Renal function CrCl cannot be calculated (Patient's most recent lab result is older than the maximum 21 days allowed.).  HB A1C (BAYER DCA - WAIVED) (%)  Date Value  03/02/2024 5.9 (H)    LDL (calc)  Date Value Ref Range Status  05/01/2013 52 <100 mg/dL Final    Comment:    LDL-C is inaccurate if patient is nonfasting.   Reference Range: ---------------- Optimal:            <100 Near/Above Optimal: 100-129 Borderline High:    130-159 High:               160-189 Very High:          >=190       LDL Chol Calc (NIH)  Date Value Ref Range Status  03/02/2024 37 0 - 99 mg/dL Final   LDL-C  Date Value Ref Range Status  03/29/2015 41 0 - 99 mg/dL Final    Comment:                              Optimal               <  100                           Above optimal     100 -  129                           Borderline        130 -  159                           High              160 -  189                           Very high             >  189 LDL-C is inaccurate if patient is non-fasting.     CLINICAL DATA:  Nonhealing ulcer of the left third toe with foot pain for 6-7  weeks. Claudication.   EXAM: NONINVASIVE PHYSIOLOGIC VASCULAR STUDY OF BILATERAL LOWER EXTREMITIES   TECHNIQUE: Non-invasive vascular evaluation of both lower extremities was performed at rest, including calculation of ankle-brachial indices, multiple segmental pressure evaluation, segmental Doppler and segmental pulse volume recording.    COMPARISON:  None available.   FINDINGS: Right Lower Extremity   Resting ABI:  0.48   Resting TBI: 0.76   Segmental Pressures: Significant gradient noted between low thigh and calf with low thigh pressure measuring 69 mm Hg and calf pressure measuring 47 mm Hg.   Great toe pressure: 83 mmHg   Arterial Waveforms: Diffuse monophasic waveforms at femoral, superficial femoral, popliteal, posterior tibial, dorsalis pedis arteries.   PVRs: Diffusely dampened pulse volume waveform seen throughout the right lower extremity.   Left Lower Extremity:   Resting ABI: 0.32   Resting TBI: 0.21   Segmental Pressures: Significant decrease in pressure between calf and ankle with calf measuring 60 mm Hg in the dorsalis pedis artery pressure measuring 35 mm Hg.   Great toe pressure: 21 mmHg   Arterial Waveforms: Diffusely monophasic waveforms seen throughout the left lower extremity arteries including the femoral, superficial femoral, popliteal, dorsalis pedis arteries. Posterior tibial artery waveform is not detected.   PVRs: Diffusely dampened pulse volume waveform seen throughout the left lower extremity.   Other: Symmetric upper extremity pressures.   Ankle Brachial index   > 1.4 Non diagnostic secondary to incompressible vessel calcifications   1.0-1.4       Normal   0.9-0.99     Borderline PAD   0.8-0.89     Mild PAD   0.5-0.79     Moderate PAD   < 0.5          Severe PAD   Toe Brachial Index   Normal     >0.65   Moderate  0.53-0.64   Severe     <0.23   Toe Pressures   Absolute toe pressure >40mmHg sufficient for wound healing.   Toe pressures <39mmHg = critical limb ischemia.   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 Bilateral pressure gradients indicate the presence of both inflow and outflow disease. Further evaluation CT angiography of the abdominal aorta and  lower extremities would be beneficial.     Electronically Signed   By: Elester Grim M.D.   On: 04/12/2024 14:29  Heber Little. Edgardo Goodwill, MD FACS [facility] Phone Number: [phone] 04/18/2024 5:09 PM   Total time spent on preparing this encounter including chart review, data review, collecting history, examining the patient, and coordinating care: 60 minutes.   Portions of this report may have been transcribed using voice recognition software.  Every effort has been made to ensure accuracy; however, inadvertent computerized transcription errors may still be present.

## 2024-04-21 ENCOUNTER — Other Ambulatory Visit: Payer: Self-pay

## 2024-04-21 ENCOUNTER — Encounter (HOSPITAL_COMMUNITY)
Admission: RE | Disposition: A | Discharge status: Home / Self Care | Payer: Self-pay | Source: Home / Self Care | Attending: Vascular Surgery

## 2024-04-21 ENCOUNTER — Ambulatory Visit (HOSPITAL_COMMUNITY)
Admission: RE | Admit: 2024-04-21 | Discharge: 2024-04-21 | Disposition: A | Discharge status: Home / Self Care | Attending: Vascular Surgery | Admitting: Vascular Surgery

## 2024-04-21 ENCOUNTER — Ambulatory Visit (HOSPITAL_BASED_OUTPATIENT_CLINIC_OR_DEPARTMENT_OTHER)

## 2024-04-21 DIAGNOSIS — I2510 Atherosclerotic heart disease of native coronary artery without angina pectoris: Secondary | ICD-10-CM | POA: Insufficient documentation

## 2024-04-21 DIAGNOSIS — J449 Chronic obstructive pulmonary disease, unspecified: Secondary | ICD-10-CM | POA: Insufficient documentation

## 2024-04-21 DIAGNOSIS — I110 Hypertensive heart disease with heart failure: Secondary | ICD-10-CM | POA: Insufficient documentation

## 2024-04-21 DIAGNOSIS — I70245 Atherosclerosis of native arteries of left leg with ulceration of other part of foot: Secondary | ICD-10-CM | POA: Diagnosis not present

## 2024-04-21 DIAGNOSIS — L97529 Non-pressure chronic ulcer of other part of left foot with unspecified severity: Secondary | ICD-10-CM | POA: Insufficient documentation

## 2024-04-21 DIAGNOSIS — F17290 Nicotine dependence, other tobacco product, uncomplicated: Secondary | ICD-10-CM | POA: Diagnosis not present

## 2024-04-21 DIAGNOSIS — Z7901 Long term (current) use of anticoagulants: Secondary | ICD-10-CM | POA: Insufficient documentation

## 2024-04-21 DIAGNOSIS — E11621 Type 2 diabetes mellitus with foot ulcer: Secondary | ICD-10-CM | POA: Diagnosis not present

## 2024-04-21 DIAGNOSIS — Z8673 Personal history of transient ischemic attack (TIA), and cerebral infarction without residual deficits: Secondary | ICD-10-CM | POA: Insufficient documentation

## 2024-04-21 DIAGNOSIS — Z9981 Dependence on supplemental oxygen: Secondary | ICD-10-CM | POA: Insufficient documentation

## 2024-04-21 DIAGNOSIS — I5032 Chronic diastolic (congestive) heart failure: Secondary | ICD-10-CM | POA: Insufficient documentation

## 2024-04-21 DIAGNOSIS — L98499 Non-pressure chronic ulcer of skin of other sites with unspecified severity: Secondary | ICD-10-CM | POA: Diagnosis not present

## 2024-04-21 DIAGNOSIS — I4819 Other persistent atrial fibrillation: Secondary | ICD-10-CM | POA: Diagnosis not present

## 2024-04-21 DIAGNOSIS — I739 Peripheral vascular disease, unspecified: Secondary | ICD-10-CM

## 2024-04-21 HISTORY — PX: LOWER EXTREMITY ANGIOGRAPHY: CATH118251

## 2024-04-21 HISTORY — PX: ABDOMINAL AORTOGRAM: CATH118222

## 2024-04-21 HISTORY — PX: LOWER EXTREMITY INTERVENTION: CATH118252

## 2024-04-21 LAB — GLUCOSE, CAPILLARY: Glucose-Capillary: 111 mg/dL — ABNORMAL HIGH (ref 70–99)

## 2024-04-21 LAB — POCT I-STAT, CHEM 8
BUN: 18 mg/dL (ref 8–23)
Calcium, Ion: 1.25 mmol/L (ref 1.15–1.40)
Chloride: 99 mmol/L (ref 98–111)
Creatinine, Ser: 1.1 mg/dL (ref 0.61–1.24)
Glucose, Bld: 101 mg/dL — ABNORMAL HIGH (ref 70–99)
HCT: 44 % (ref 39.0–52.0)
Hemoglobin: 15 g/dL (ref 13.0–17.0)
Potassium: 4 mmol/L (ref 3.5–5.1)
Sodium: 142 mmol/L (ref 135–145)
TCO2: 30 mmol/L (ref 22–32)

## 2024-04-21 SURGERY — ABDOMINAL AORTOGRAM
Anesthesia: LOCAL

## 2024-04-21 MED ORDER — ACETAMINOPHEN 325 MG PO TABS: 650.0000 mg | ORAL_TABLET | ORAL | Status: DC | PRN

## 2024-04-21 MED ORDER — MIDAZOLAM HCL 2 MG/2ML IJ SOLN
INTRAMUSCULAR | Status: AC
  Filled 2024-04-21: qty 2

## 2024-04-21 MED ORDER — SODIUM CHLORIDE 0.9 % WEIGHT BASED INFUSION
1.0000 mL/kg/h | INTRAVENOUS | Status: DC
  Administered 2024-04-21: 1 mL/kg/h via INTRAVENOUS

## 2024-04-21 MED ORDER — HEPARIN (PORCINE) IN NACL 2000-0.9 UNIT/L-% IV SOLN
INTRAVENOUS | Status: DC | PRN
  Administered 2024-04-21: 1000 mL

## 2024-04-21 MED ORDER — LIDOCAINE HCL (PF) 1 % IJ SOLN
INTRAMUSCULAR | Status: DC | PRN
  Administered 2024-04-21: 15 mL

## 2024-04-21 MED ORDER — IODIXANOL 320 MG/ML IV SOLN
INTRAVENOUS | Status: DC | PRN
  Administered 2024-04-21: 55 mL

## 2024-04-21 MED ORDER — HEPARIN SODIUM (PORCINE) 1000 UNIT/ML IJ SOLN
INTRAMUSCULAR | Status: AC
  Filled 2024-04-21: qty 10

## 2024-04-21 MED ORDER — FENTANYL CITRATE (PF) 100 MCG/2ML IJ SOLN
INTRAMUSCULAR | Status: AC
  Filled 2024-04-21: qty 2

## 2024-04-21 MED ORDER — SODIUM CHLORIDE 0.9% FLUSH: 3.0000 mL | Freq: Two times a day (BID) | INTRAVENOUS | Status: DC

## 2024-04-21 MED ORDER — FENTANYL CITRATE (PF) 100 MCG/2ML IJ SOLN
INTRAMUSCULAR | Status: DC | PRN
  Administered 2024-04-21: 50 ug via INTRAVENOUS

## 2024-04-21 MED ORDER — HYDRALAZINE HCL 20 MG/ML IJ SOLN: 5.0000 mg | INTRAMUSCULAR | Status: DC | PRN

## 2024-04-21 MED ORDER — SODIUM CHLORIDE 0.9 % IV SOLN: INTRAVENOUS | Status: DC

## 2024-04-21 MED ORDER — LABETALOL HCL 5 MG/ML IV SOLN: 10.0000 mg | INTRAVENOUS | Status: DC | PRN

## 2024-04-21 MED ORDER — SODIUM CHLORIDE 0.9 % IV SOLN: 250.0000 mL | INTRAVENOUS | Status: DC | PRN

## 2024-04-21 MED ORDER — MIDAZOLAM HCL 2 MG/2ML IJ SOLN
INTRAMUSCULAR | Status: DC | PRN
  Administered 2024-04-21: 1 mg via INTRAVENOUS

## 2024-04-21 MED ORDER — ONDANSETRON HCL 4 MG/2ML IJ SOLN: 4.0000 mg | Freq: Four times a day (QID) | INTRAMUSCULAR | Status: DC | PRN

## 2024-04-21 MED ORDER — LIDOCAINE HCL (PF) 1 % IJ SOLN
INTRAMUSCULAR | Status: AC
  Filled 2024-04-21: qty 30

## 2024-04-21 MED ORDER — SODIUM CHLORIDE 0.9% FLUSH: 3.0000 mL | INTRAVENOUS | Status: DC | PRN

## 2024-04-21 SURGICAL SUPPLY — 12 items
CATH OMNI FLUSH 5F 65CM (CATHETERS) IMPLANT
GLIDEWIRE ADV .035X260CM (WIRE) IMPLANT
KIT MICROPUNCTURE NIT STIFF (SHEATH) IMPLANT
KIT PV (KITS) ×2 IMPLANT
KIT SYRINGE INJ CVI SPIKEX1 (MISCELLANEOUS) IMPLANT
SET ATX-X65L (MISCELLANEOUS) IMPLANT
SHEATH CATAPULT 6FR 45 (SHEATH) IMPLANT
SHEATH PINNACLE 5F 10CM (SHEATH) IMPLANT
SYR MEDRAD MARK 7 150ML (SYRINGE) ×2 IMPLANT
TRANSDUCER W/STOPCOCK (MISCELLANEOUS) ×2 IMPLANT
TRAY PV CATH (CUSTOM PROCEDURE TRAY) ×2 IMPLANT
WIRE BENTSON .035X145CM (WIRE) IMPLANT

## 2024-04-21 NOTE — Op Note (Signed)
 DATE OF SERVICE: 04/21/2024  PATIENT:  Luis Salinas  78 y.o. male  PRE-OPERATIVE DIAGNOSIS:  Atherosclerosis of native arteries of left lower extremity causing ulceration  POST-OPERATIVE DIAGNOSIS:  Same  PROCEDURE:   1) Ultrasound guided right common femoral artery access 2) Aortogram 3) Left lower extremity angiogram with second order cannulation 4) Conscious sedation (27 minutes)  SURGEON:  Heber Little. Edgardo Goodwill, MD  ASSISTANT: none  ANESTHESIA:   local and IV sedation  ESTIMATED BLOOD LOSS: minimal  LOCAL MEDICATIONS USED:  LIDOCAINE    COUNTS: confirmed correct.  PATIENT DISPOSITION:  PACU - hemodynamically stable.   Delay start of Pharmacological VTE agent (>24hrs) due to surgical blood loss or risk of bleeding: no  INDICATION FOR PROCEDURE: CHIRSTOPHER Salinas is a 78 y.o. male with left leg ischemic ulceration and severely depressed ABI. After careful discussion of risks, benefits, and alternatives the patient was offered angiography. The patient understood and wished to proceed.  OPERATIVE FINDINGS:   Left renal artery: ~70% proximal artery stenosis Right renal artery: patent  Infrarenal aorta: patent  Left common iliac artery: patent Right common iliac artery: patent  Left internal iliac artery: patent Right internal iliac artery: patent  Left external iliac artery: patent Right external iliac artery: patent  Left common femoral artery: not studied Right common femoral artery: occluded  Left profunda femoris artery: not studied Right profunda femoris artery: occluded at its origin; reconstitutes via pelvic collaterals  Left superficial femoral artery: not studied Right superficial femoral artery: occluded at its origin; reconstitutes via pelvic collaterals  Left popliteal artery: not studied Right popliteal artery: heavily calcified; reconstitutes via collateral; above and behind knee artery is heavily diseased. Below knee popliteal artery appears patent.  Left anterior  tibial artery: not studied Right anterior tibial artery: dominant tibial; courses to foot   Left tibioperoneal trunk: not studied Right tibioperoneal trunk: patent  Left peroneal artery: not studied Right peroneal artery: patent  Left posterior tibial artery: not studied Right posterior tibial artery: occluded  Left pedal circulation: not studied Right pedal circulation: fills via AT > PT   GLASS score. FP 3. IP 0. P 1. Stage III.   WIfI score. 1 / 3 / 0. Stage III.   DESCRIPTION OF PROCEDURE: After identification of the patient in the pre-operative holding area, the patient was transferred to the operating room. The patient was positioned supine on the operating room table. Anesthesia was induced. The groins was prepped and draped in standard fashion. A surgical pause was performed confirming correct patient, procedure, and operative location.  The right groin was anesthetized with subcutaneous injection of 1% lidocaine . Using ultrasound guidance, the right common femoral artery was accessed with micropuncture technique. Fluoroscopy was used to confirm cannulation over the femoral head. The 78F micropuncture sheath was upsized to 33F.   A Benson wire was advanced into the distal aorta. Over the wire an omni flush catheter was advanced to the level of L2. Aortogram was performed - see above for details.   The left common iliac artery was selected with an omniflush catheter and glidewire guidewire. The wire was advanced into the common femoral artery. Over the wire the omni flush catheter was advanced into the external iliac artery. Selective angiography was performed - see above for details.   The sheath was left in place to be removed in the recovery area.  Conscious sedation was administered with the use of IV fentanyl  and midazolam under continuous physician and nurse monitoring.  Heart rate, blood  pressure, and oxygen  saturation were continuously monitored.  Total sedation time was 27  minutes  Upon completion of the case instrument and sharps counts were confirmed correct. The patient was transferred to the PACU in good condition. I was present for all portions of the procedure.  PLAN: ASA / Statin. Saphenous vein mapping. Plan L CFA - BKPA bypass with retrograde iliac stenting as soon as OR available.   Heber Little. Edgardo Goodwill, MD [facility] Phone Number: [phone] 04/21/2024 9:22 AM

## 2024-04-21 NOTE — Progress Notes (Signed)
 BLE vein mapping has been completed.   Results can be found under chart review under CV PROC. 04/21/2024 4:45 PM Sigourney Portillo RVT, RDMS

## 2024-04-21 NOTE — Progress Notes (Signed)
 Site area: R. Femoral   Site Prior to Removal:  Level 0 Pressure Applied For:25 minutes Manual:  Yes  Patient Status During Pull:  Stable  Post Pull Site:  Level 0 Post Pull Instructions Given:  Yes Post Pull Pulses Present:  Right PT dopplered  Dressing Applied:  Gauze and tegaderm Bedrest begins @ 1015 Comments:

## 2024-04-21 NOTE — Discharge Instructions (Signed)
 Femoral Site Care This sheet gives you information about how to care for yourself after your procedure. Your health care provider may also give you more specific instructions. If you have problems or questions, contact your health care provider. What can I expect after the procedure?  After the procedure, it is common to have: Bruising that usually fades within 1-2 weeks. Tenderness at the site. Follow these instructions at home: Wound care Follow instructions from your health care provider about how to take care of your insertion site. Make sure you: Wash your hands with soap and water before you change your bandage (dressing). If soap and water are not available, use hand sanitizer. Remove your dressing as told by your health care provider. In 24 hours Do not take baths, swim, or use a hot tub until your health care provider approves. For 5 days You may shower 24 hours after the procedure or as told by your health care provider. Gently wash the site with plain soap and water. Pat the area dry with a clean towel. Do not rub the site. This may cause bleeding. Do not apply powder or lotion to the site. Keep the site clean and dry. Check your femoral site every day for signs of infection. Check for: Redness, swelling, or pain. Fluid or blood. Warmth. Pus or a bad smell. Activity For the first 2-3 days after your procedure, or as long as directed: Avoid climbing stairs as much as possible. Do not squat. Do not lift anything that is heavier than 10 lb (4.5 kg), or the limit that you are told, until your health care provider says that it is safe. For 5 days Rest as directed. Avoid sitting for a long time without moving. Get up to take short walks every 1-2 hours. Do not drive for 24 hours if you were given a medicine to help you relax (sedative). General instructions Take over-the-counter and prescription medicines only as told by your health care provider. Keep all follow-up visits as  told by your health care provider. This is important. Contact a health care provider if you have: A fever or chills. You have redness, swelling, or pain around your insertion site. Get help right away if: The catheter insertion area swells very fast. You pass out. You suddenly start to sweat or your skin gets clammy. The catheter insertion area is bleeding, and the bleeding does not stop when you hold steady pressure on the area. The area near or just beyond the catheter insertion site becomes pale, cool, tingly, or numb. These symptoms may represent a serious problem that is an emergency. Do not wait to see if the symptoms will go away. Get medical help right away. Call your local emergency services (911 in the [place] Do not drive yourself to the hospital. Summary After the procedure, it is common to have bruising that usually fades within 1-2 weeks. Check your femoral site every day for signs of infection. Do not lift anything that is heavier than 10 lb (4.5 kg), or the limit that you are told, until your health care provider says that it is safe. This information is not intended to replace advice given to you by your health care provider. Make sure you discuss any questions you have with your health care provider. Document Revised: 12/13/2017 Document Reviewed: 12/13/2017 Elsevier Patient Education  2020 [employer].

## 2024-04-21 NOTE — Progress Notes (Signed)
 Paged and spoke to MD [facility] twice to get med rec completed before dc. Still not completed

## 2024-04-22 ENCOUNTER — Encounter (HOSPITAL_COMMUNITY): Payer: Self-pay | Admitting: Vascular Surgery

## 2024-04-24 ENCOUNTER — Other Ambulatory Visit: Payer: Self-pay

## 2024-04-24 ENCOUNTER — Encounter (HOSPITAL_COMMUNITY): Payer: Self-pay | Admitting: Vascular Surgery

## 2024-04-24 NOTE — Progress Notes (Signed)
 SDW call  Patient's wife, Luis Salinas was given pre-op instructions over the phone. She verbalized understanding of instructions provided.  PCP - Dr. Melodie Spry Dettinger Cardiologist - Dr. Carson Clara, LOV 10/22/2023 Pulmonary:    PPM/ICD - denies Device Orders - na Rep Notified - na   Chest x-ray - na EKG -  06/16/2023 Stress Test - ECHO - 09/20/2021 Cardiac Cath - 11/2007 at [facility]  Sleep Study/sleep apnea/CPAP: denies.  Patient is on home oxygen  4L/[place]  Type II diabetic.  Wife states they took him off his Jardiance  approx 6 months ago to due sig weight loss. A1c 5.9 03/02/2024 Fasting Blood sugar range: 99-110 How often check sugars: Monthly   Blood Thinner Instructions: Eliquis , instructed to stop 04/22/2024 but spouse states last dose was 04/18/2024 prior to his previous surgery.  He did not re-start in anticipation for this surgery Aspirin  Instructions: continue   ERAS Protcol - NPO   Anesthesia review: Yes. HTN, MI, stroke, DM, a-fib, CHF, HLD   Luis Salinas denies any fever, cough and chest pain for patient over the phone call  Your procedure is scheduled on Wednesday Apr 26, 2024  Report to [facility] Main Entrance "A" at  0915  A.M., then check in with the Admitting office.  Call this number if you have problems the morning of surgery:  [phone]   If you have any questions prior to your surgery date call [phone]: Open Monday-Friday 8am-4pm If you experience any cold or flu symptoms such as cough, fever, chills, shortness of breath, etc. between now and your scheduled surgery, please notify us  at the above number    Remember:  Do not eat or drink after midnight the night before your surgery  Take these medicines the morning of surgery with A SIP OF WATER:  ASA, bystolic   As needed: Tylenol , albuterol , breztri   As of today, STOP taking any Aleve , Naproxen , Ibuprofen , Motrin , Advil , Goody's, BC's, all herbal medications, fish oil, and all vitamins.

## 2024-04-25 NOTE — Anesthesia Preprocedure Evaluation (Signed)
 Anesthesia Evaluation  Patient identified by MRN, date of birth, ID band Patient awake    Reviewed: Allergy & Precautions, NPO status , Patient's Chart, lab work & pertinent test results, reviewed documented beta blocker date and time   Airway Mallampati: I  TM Distance: >3 FB Neck ROM: Full    Dental  (+) Edentulous Upper, Dental Advisory Given   Pulmonary COPD, Current Smoker and Patient abstained from smoking.    + decreased breath sounds      Cardiovascular hypertension, Pt. on medications and Pt. on home beta blockers + CAD, + Past MI, + Cardiac Stents and +CHF  + dysrhythmias  Rhythm:Regular Rate:Normal     Neuro/Psych CVA, No Residual Symptoms  negative psych ROS   GI/Hepatic negative GI ROS, Neg liver ROS,,,  Endo/Other  diabetes    Renal/GU negative Renal ROS     Musculoskeletal  (+) Arthritis ,    Abdominal   Peds  Hematology   Anesthesia Other Findings   Reproductive/Obstetrics                             Anesthesia Physical Anesthesia Plan  ASA: 3  Anesthesia Plan: General   Post-op Pain Management: Ofirmev  IV (intra-op)*   Induction: Intravenous  PONV Risk Score and Plan: 2 and Ondansetron  and Treatment may vary due to age or medical condition  Airway Management Planned: Oral ETT  Additional Equipment: Arterial line  Intra-op Plan:   Post-operative Plan: Extubation in OR  Informed Consent: I have reviewed the patients History and Physical, chart, labs and discussed the procedure including the risks, benefits and alternatives for the proposed anesthesia with the patient or authorized representative who has indicated his/her understanding and acceptance.     Dental advisory given  Plan Discussed with: CRNA  Anesthesia Plan Comments: (PAT note written 04/25/2024 by Ella Gun, PA-C. STEMI s/p BMS LCX 2008, chronic diastolic CHF, PAF, COPD on home O2 4L.     Echo 09/20/21 (during admission for acute diastolic CHF with new onset afib, improved with diuresis, s/p DCCV 10/2021): IMPRESSIONS  1. Left ventricular ejection fraction, by estimation, is 60 to 65%. The  left ventricle has normal function. The left ventricle has no regional  wall motion abnormalities. There is mild left ventricular hypertrophy.  Left ventricular diastolic parameters are consistent with Grade II diastolic dysfunction (pseudonormalization). There is the interventricular septum is flattened in systole and diastole, consistent with right ventricular pressure and volume overload.  2. Right ventricular systolic function is moderately reduced. The right ventricular size is normal. There is moderately elevated pulmonary artery systolic pressure. The estimated right ventricular systolic pressure is 54.9 mmHg.  3. Left atrial size was severely dilated.  4. Right atrial size was moderately dilated.  5. The mitral valve is normal in structure. No evidence of mitral valve regurgitation. No evidence of mitral stenosis. MV peak  gradient, 5.1 mmHg. The mean mitral valve gradient is 2.0 mmHg.  6. The aortic valve is tricuspid. There is moderate calcification of the aortic valve. There is moderate thickening of the aortic valve. Aortic valve regurgitation is not visualized. Mild to moderate aortic valve stenosis. Aortic valve mean gradient measures 4.7 mmHg. Aortic valve peak gradient measures 8.6 mmHg. Aortic valve area, by VTI measures 2.37 cm.  AORTIC VALVE                     AV Area (Vmax):    2.28  cm       AV Area (Vmean):   2.35 cm      AV Area (VTI):     2.37 cm  AV Vmax:           146.33 cm/s  AV Vmean:          97.300 cm/s  AV VTI:            0.253 m  AV Peak Grad:      8.6 mmHg  AV Mean Grad:      4.7 mmHg  LVOT Vmax:         106.00 cm/s  LVOT Vmean:        72.800 cm/s  LVOT VTI:          0.191 m  LVOT/AV VTI ratio: 0.76  7. The inferior vena cava is dilated in  size with <50% respiratory  variability, suggesting right atrial pressure of 15 mmHg.   )       Anesthesia Quick Evaluation

## 2024-04-25 NOTE — Progress Notes (Signed)
 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 S/p LLE angigram on 04/21/24 for evaluation of ischemic ulceration and severely depressed ABI. Based on findings, left CFA-(below knee) popliteal artery bypass is planned and retrograde iliac stenting.   History includes smoking, HTN, dyslipidemia, DM2, CAD (inferoposterior STEMI 11/2007, s/p BMS CX), chronic diastolic CHF, PAF (diagnosed 09/2021, s/p DCCV 11/04/21), CVA (2014), concussion, amnesia (4540, reported missing from family for 26 years and required a PI to find him), COPD (home O2 at 4L/[place]).  Cardiology visit was on 06/16/2023 with Lawana Pray, NP for follow-up CAD, PAF, chronic diastolic CHF, HTN. Denied any recent episodes of anginal symptoms and wrote, "Continues to stay physically active and continues to deny exertional chest discomfort." Euvolemic on exam. HTN controlled. EKG then showed SR, first degree AV block LAD. Continued medical therapy recommended. 9-12 month follow-up planned.   Last echo is fro 09/20/21 during admission for diastolic CHF in setting of new on set afib. Results showed LVEF 60-65%, no RWMA, mild LVH, grade II DD, interventricular septum is flattened in systole and diastole consistent with RV volume overload, moderately reduced RVSF, moderately elevated PASP, RVSP 54.9 mmHg, severely dilated LA, moderately  dilated RA, MV peak gradient 5.1 mmHg, mean MV gradient 2.0 mmHg, no evidence of MR or MS. Report lists no AR with mild to moderate AS,  although AV measurements documented as mean AV gradient 4.7 mmHg. AV peak gradient 8.6 mmHg, and AVA by VTI measures 2.37 cm.  Primary care visit on 03/02/24 with Dr. Steen Eden for ongoing management of chronic medical issues. A1c 5.9%. HTN controlled. Tolerating Crestor . He felt breathing was stable. No changes made. 3 month follow-up planned.   Last Eliquis  04/18/24 since it was held prior to his arteriogram. He remains on ASA.  He is a same day work-up. Reviewed available information with anesthesiologist Jonne Netters, MD . Anesthesia team to evaluate on the day of surgery.  VS:  Wt Readings from Last 3 Encounters:  04/21/24 58.1 kg  04/18/24 58.1 kg  03/02/24 59.9 kg   BP Readings from Last 3 Encounters:  04/21/24 (!) 98/55  04/18/24 (!) 99/57  03/02/24 113/60   Pulse Readings from Last 3 Encounters:  04/21/24 (!) 53  04/18/24 63  03/02/24 60    PROVIDERS: Dettinger, Lucio Sabin, MD is PCP  Carson Clara, MD is cardiologist    LABS: For day of surgery as indicated. Last results in [facility] include: Lab Results  Component Value Date   WBC 10.0 03/02/2024   HGB 15.0 04/21/2024   HCT 44.0 04/21/2024   PLT 190 03/02/2024   GLUCOSE 101 (H) 04/21/2024   CHOL 100 03/02/2024   TRIG 89 03/02/2024   HDL 46 03/02/2024   LDLCALC 37 03/02/2024   ALT 21 03/02/2024   AST 24 03/02/2024   NA 142 04/21/2024   K 4.0 04/21/2024   CL 99 04/21/2024   CREATININE 1.10 04/21/2024   BUN 18 04/21/2024  CO2 26 03/02/2024   TSH 6.972 (H) 09/20/2021   PSA 0.7 12/27/2014   INR 1.2 09/20/2021   HGBA1C 5.9 (H) 03/02/2024    EKG: 06/16/23: Sinus rhythm with 1st degree A-V block Left axis deviation When compared with ECG of 17-Feb-2022 09:57, Sinus rhythm has replaced Atrial flutter QRS axis Shifted right Confirmed by Lawana Pray [phone]) on 06/16/2023 11:01:20 AM   CV: Echo 09/20/21 (during admission for acute diastolic CHF with new onset afib, improved with diuresis, s/p DCCV  10/2021): IMPRESSIONS   1. Left ventricular ejection fraction, by estimation, is 60 to 65%. The  left ventricle has normal function. The left ventricle has no regional  wall motion abnormalities. There is mild left ventricular hypertrophy.  Left ventricular diastolic parameters are consistent with Grade II diastolic dysfunction (pseudonormalization). There is the interventricular septum is flattened in systole and diastole, consistent with right ventricular pressure and volume overload.   2. Right ventricular systolic function is moderately reduced. The right ventricular size is normal. There is moderately elevated pulmonary artery systolic pressure. The estimated right ventricular systolic pressure is 54.9 mmHg.   3. Left atrial size was severely dilated.   4. Right atrial size was moderately dilated.   5. The mitral valve is normal in structure. No evidence of mitral valve regurgitation. No evidence of mitral stenosis. MV peak  gradient, 5.1 mmHg. The mean mitral valve gradient is 2.0 mmHg.   6. The aortic valve is tricuspid. There is moderate calcification of the aortic valve. There is moderate thickening of the aortic valve. Aortic valve regurgitation is not visualized. Mild to moderate aortic valve stenosis. Aortic valve mean gradient measures 4.7 mmHg. Aortic valve peak gradient measures 8.6 mmHg. Aortic valve area, by VTI measures 2.37 cm.   7. The inferior vena cava is dilated in size with <50% respiratory  variability, suggesting right atrial pressure of 15 mmHg.    US  Carotid 03/09/18: IMPRESSION: Color duplex indicates moderate heterogeneous and calcified plaque, with no hemodynamically significant stenosis by duplex criteria in the extracranial cerebrovascular circulation.    Cardiac cath 11/30/07 ([facility]): CORONARY ARTERIOGRAMS  Dominance: mixed  SA node origin: right  AV node origin: right  Branch                    Stenosis  Lesion Type  TIMI Flow   ------------------------  --------  -----------  ---------  Right Coronary Artery    *Prox RCA  (Small)      70%       Discrete    Mid RCA  (Small)    Dist RCA  (Small)    R PDA  (Small)  Left Main    L Ostium                20%       Discrete  Left Circumflex    Mid LCX                 30%       Discrete     TIMI 0    *Mid LCX                100%      Discrete     TIMI 0    *Dist LCX               80%       Discrete     TIMI 0    OM1  (Small)  OM2                                            TIMI 0    OM3                                            TIMI 0    LPL1                                           TIMI 0    LPL2                                           TIMI 0    LPL3                                           TIMI 0    *LPAV                   90%       Discrete     TIMI 0  Left Anterior Descending    *Mid LAD                70%       Discrete    *Mid LAD                70%       Discrete    Mid LAD                 40%       Discrete    *D1  (Large)            70%       Discrete    *D1  (Large)            70%       Discrete    D3  (Small)  * Denotes significant lesion   Infarct Vessel:  LCX  MI Location:  inferior, posterior   PCI:  Mid LCX 100%  (pre) to Normal (post). The area was stented with a 3.0x23 Vision BMS at 14 atm for 3 minutes. The final angiographic result was good.    Past Medical History:  Diagnosis Date   Amnesia 22   Was in a coma and had no memory of the past. His family found him after 27 years   Arthritis    Cataract    Chronic diastolic CHF (congestive heart failure) (HCC)    COPD (chronic obstructive pulmonary disease) (HCC)    Dyslipidemia    History of concussion    History of STEMI    a. 11/2007 s/p DES to LCX at [facility]   Hypertension    On home oxygen  therapy    4L/[place]   Paroxysmal atrial fibrillation (HCC)    Pneumonia    exposure to caustic chemicals   Stroke [facility])    multiple strokes   Tobacco abuse  Type 2 diabetes mellitus  [facility])     Past Surgical History:  Procedure Laterality Date   ABDOMINAL AORTOGRAM N/A 04/21/2024   Procedure: ABDOMINAL AORTOGRAM;  Surgeon: Carlene Che, MD;  Location: [facility] INVASIVE CV LAB;  Service: Cardiovascular;  Laterality: N/A;   ANTERIOR CERVICAL DECOMP/DISCECTOMY FUSION  05/16/2012   Procedure: ANTERIOR CERVICAL DECOMPRESSION/DISCECTOMY FUSION 2 LEVELS;  Surgeon: Elna Haggis, MD;  Location: [facility] NEURO ORS;  Service: Neurosurgery;  Laterality: N/A;  Cervical Five-Six, Cervical Six-Seven Anterior Cervical Decompression/Diskectomy Fusion   CARDIAC CATHETERIZATION     CARDIOVERSION N/A 11/10/2021   Procedure: CARDIOVERSION;  Surgeon: Loyde Rule, MD;  Location: [facility] ENDOSCOPY;  Service: Cardiovascular;  Laterality: N/A;   COLONOSCOPY W/ POLYPECTOMY     CORONARY ANGIOPLASTY     EYE SURGERY     cataracts   JOINT REPLACEMENT     Left knee replacement   JOINT REPLACEMENT     left elbow at age 62   LOWER EXTREMITY ANGIOGRAPHY N/A 04/21/2024   Procedure: Lower Extremity Angiography;  Surgeon: Carlene Che, MD;  Location: [facility] INVASIVE CV LAB;  Service: Cardiovascular;  Laterality: N/A;   LOWER EXTREMITY INTERVENTION N/A 04/21/2024   Procedure: LOWER EXTREMITY INTERVENTION;  Surgeon: Carlene Che, MD;  Location: [facility] INVASIVE CV LAB;  Service: Cardiovascular;  Laterality: N/A;   ROTATOR CUFF REPAIR     left   TONSILLECTOMY      MEDICATIONS: No current facility-administered medications for this encounter.    ACCU-CHEK GUIDE test strip   Accu-Chek Softclix Lancets lancets   acetaminophen  (TYLENOL ) 650 MG CR tablet   albuterol  (VENTOLIN  HFA) 108 (90 Base) MCG/ACT inhaler   apixaban  (ELIQUIS ) 5 MG TABS tablet   aspirin  EC ([employer] ASPIRIN  LOW DOSE) 81 MG tablet   b complex vitamins capsule   blood glucose meter kit and supplies KIT   Budeson-Glycopyrrol-Formoterol  (BREZTRI  AEROSPHERE) 160-9-4.8 MCG/ACT AERO   diphenhydramine-acetaminophen  (TYLENOL  PM) 25-500 MG TABS tablet    furosemide  (LASIX ) 40 MG tablet   Multiple Vitamins-Minerals (PRESERVISION AREDS 2) CAPS   nebivolol  (BYSTOLIC ) 10 MG tablet   nebivolol  (BYSTOLIC ) 5 MG tablet   nitroGLYCERIN  (NITROSTAT ) 0.4 MG SL tablet   OVER THE COUNTER MEDICATION   OVER THE COUNTER MEDICATION   OXYGEN    potassium chloride  SA (KLOR-CON  M20) 20 MEQ tablet   rosuvastatin  (CRESTOR ) 40 MG tablet    Ella Gun, PA-C Surgical Short Stay/Anesthesiology [facility] Phone [phone] [facility] Phone [phone] 04/25/2024 12:43 PM

## 2024-04-26 ENCOUNTER — Other Ambulatory Visit: Payer: Self-pay

## 2024-04-26 ENCOUNTER — Inpatient Hospital Stay (HOSPITAL_COMMUNITY): Payer: Self-pay | Admitting: Vascular Surgery

## 2024-04-26 ENCOUNTER — Inpatient Hospital Stay (HOSPITAL_COMMUNITY)

## 2024-04-26 ENCOUNTER — Inpatient Hospital Stay (HOSPITAL_COMMUNITY)
Admission: RE | Admit: 2024-04-26 | Discharge: 2024-04-28 | DRG: 271 | Disposition: A | Discharge status: Home / Self Care | Attending: Vascular Surgery | Admitting: Vascular Surgery

## 2024-04-26 ENCOUNTER — Encounter (HOSPITAL_COMMUNITY): Payer: Self-pay | Admitting: Vascular Surgery

## 2024-04-26 ENCOUNTER — Encounter (HOSPITAL_COMMUNITY)
Admission: RE | Disposition: A | Discharge status: Home / Self Care | Payer: Self-pay | Source: Home / Self Care | Attending: Vascular Surgery

## 2024-04-26 DIAGNOSIS — Z7901 Long term (current) use of anticoagulants: Secondary | ICD-10-CM | POA: Diagnosis not present

## 2024-04-26 DIAGNOSIS — I2510 Atherosclerotic heart disease of native coronary artery without angina pectoris: Secondary | ICD-10-CM | POA: Diagnosis present

## 2024-04-26 DIAGNOSIS — F17290 Nicotine dependence, other tobacco product, uncomplicated: Secondary | ICD-10-CM | POA: Diagnosis present

## 2024-04-26 DIAGNOSIS — E11621 Type 2 diabetes mellitus with foot ulcer: Secondary | ICD-10-CM | POA: Diagnosis not present

## 2024-04-26 DIAGNOSIS — Z96652 Presence of left artificial knee joint: Secondary | ICD-10-CM | POA: Diagnosis present

## 2024-04-26 DIAGNOSIS — I110 Hypertensive heart disease with heart failure: Secondary | ICD-10-CM | POA: Diagnosis present

## 2024-04-26 DIAGNOSIS — Z825 Family history of asthma and other chronic lower respiratory diseases: Secondary | ICD-10-CM

## 2024-04-26 DIAGNOSIS — Z7902 Long term (current) use of antithrombotics/antiplatelets: Secondary | ICD-10-CM | POA: Diagnosis not present

## 2024-04-26 DIAGNOSIS — I5032 Chronic diastolic (congestive) heart failure: Secondary | ICD-10-CM

## 2024-04-26 DIAGNOSIS — Z860100 Personal history of colon polyps, unspecified: Secondary | ICD-10-CM | POA: Diagnosis not present

## 2024-04-26 DIAGNOSIS — Y838 Other surgical procedures as the cause of abnormal reaction of the patient, or of later complication, without mention of misadventure at the time of the procedure: Secondary | ICD-10-CM | POA: Diagnosis not present

## 2024-04-26 DIAGNOSIS — I440 Atrioventricular block, first degree: Secondary | ICD-10-CM | POA: Diagnosis present

## 2024-04-26 DIAGNOSIS — T82856A Stenosis of peripheral vascular stent, initial encounter: Principal | ICD-10-CM | POA: Diagnosis present

## 2024-04-26 DIAGNOSIS — Z8673 Personal history of transient ischemic attack (TIA), and cerebral infarction without residual deficits: Secondary | ICD-10-CM | POA: Diagnosis not present

## 2024-04-26 DIAGNOSIS — Z8249 Family history of ischemic heart disease and other diseases of the circulatory system: Secondary | ICD-10-CM | POA: Diagnosis not present

## 2024-04-26 DIAGNOSIS — L97529 Non-pressure chronic ulcer of other part of left foot with unspecified severity: Secondary | ICD-10-CM

## 2024-04-26 DIAGNOSIS — Z96622 Presence of left artificial elbow joint: Secondary | ICD-10-CM | POA: Diagnosis not present

## 2024-04-26 DIAGNOSIS — J449 Chronic obstructive pulmonary disease, unspecified: Secondary | ICD-10-CM | POA: Diagnosis present

## 2024-04-26 DIAGNOSIS — I480 Paroxysmal atrial fibrillation: Secondary | ICD-10-CM | POA: Diagnosis not present

## 2024-04-26 DIAGNOSIS — E785 Hyperlipidemia, unspecified: Secondary | ICD-10-CM | POA: Diagnosis present

## 2024-04-26 DIAGNOSIS — E1151 Type 2 diabetes mellitus with diabetic peripheral angiopathy without gangrene: Secondary | ICD-10-CM | POA: Diagnosis not present

## 2024-04-26 DIAGNOSIS — Z832 Family history of diseases of the blood and blood-forming organs and certain disorders involving the immune mechanism: Secondary | ICD-10-CM

## 2024-04-26 DIAGNOSIS — Z7982 Long term (current) use of aspirin: Secondary | ICD-10-CM

## 2024-04-26 DIAGNOSIS — I70245 Atherosclerosis of native arteries of left leg with ulceration of other part of foot: Secondary | ICD-10-CM

## 2024-04-26 DIAGNOSIS — I252 Old myocardial infarction: Secondary | ICD-10-CM

## 2024-04-26 DIAGNOSIS — Z7951 Long term (current) use of inhaled steroids: Secondary | ICD-10-CM | POA: Diagnosis not present

## 2024-04-26 DIAGNOSIS — Z981 Arthrodesis status: Secondary | ICD-10-CM

## 2024-04-26 DIAGNOSIS — Z79899 Other long term (current) drug therapy: Secondary | ICD-10-CM | POA: Diagnosis not present

## 2024-04-26 DIAGNOSIS — F17210 Nicotine dependence, cigarettes, uncomplicated: Secondary | ICD-10-CM | POA: Diagnosis not present

## 2024-04-26 DIAGNOSIS — I739 Peripheral vascular disease, unspecified: Secondary | ICD-10-CM | POA: Diagnosis not present

## 2024-04-26 DIAGNOSIS — Z87820 Personal history of traumatic brain injury: Secondary | ICD-10-CM

## 2024-04-26 DIAGNOSIS — I70222 Atherosclerosis of native arteries of extremities with rest pain, left leg: Secondary | ICD-10-CM | POA: Diagnosis present

## 2024-04-26 HISTORY — PX: FEMORAL-POPLITEAL BYPASS GRAFT: SHX937

## 2024-04-26 HISTORY — PX: ENDARTERECTOMY FEMORAL: SHX5804

## 2024-04-26 HISTORY — DX: Dependence on supplemental oxygen: Z99.81

## 2024-04-26 HISTORY — PX: LOWER EXTREMITY ANGIOGRAM: SHX5508

## 2024-04-26 HISTORY — PX: INSERTION OF ILIAC STENT: SHX6256

## 2024-04-26 LAB — URINALYSIS, ROUTINE W REFLEX MICROSCOPIC
Bilirubin Urine: NEGATIVE
Glucose, UA: NEGATIVE mg/dL
Hgb urine dipstick: NEGATIVE
Ketones, ur: NEGATIVE mg/dL
Leukocytes,Ua: NEGATIVE
Nitrite: NEGATIVE
Protein, ur: 100 mg/dL — AB
Specific Gravity, Urine: 1.027 (ref 1.005–1.030)
pH: 5 (ref 5.0–8.0)

## 2024-04-26 LAB — COMPREHENSIVE METABOLIC PANEL WITH GFR
ALT: 23 U/L (ref 0–44)
AST: 26 U/L (ref 15–41)
Albumin: 3.8 g/dL (ref 3.5–5.0)
Alkaline Phosphatase: 65 U/L (ref 38–126)
Anion gap: 9 (ref 5–15)
BUN: 19 mg/dL (ref 8–23)
CO2: 29 mmol/L (ref 22–32)
Calcium: 9.4 mg/dL (ref 8.9–10.3)
Chloride: 104 mmol/L (ref 98–111)
Creatinine, Ser: 1.04 mg/dL (ref 0.61–1.24)
GFR, Estimated: >60 mL/min (ref >60)
Glucose, Bld: 104 mg/dL — ABNORMAL HIGH (ref 70–99)
Potassium: 4.2 mmol/L (ref 3.5–5.1)
Sodium: 142 mmol/L (ref 135–145)
Total Bilirubin: 0.6 mg/dL (ref 0.0–1.2)
Total Protein: 7.1 g/dL (ref 6.5–8.1)

## 2024-04-26 LAB — CBC
HCT: 45.7 % (ref 39.0–52.0)
Hemoglobin: 14.4 g/dL (ref 13.0–17.0)
MCH: 29 pg (ref 26.0–34.0)
MCHC: 31.5 g/dL (ref 30.0–36.0)
MCV: 92.1 fL (ref 80.0–100.0)
Platelets: 169 10*3/uL (ref 150–400)
RBC: 4.96 MIL/uL (ref 4.22–5.81)
RDW: 14.1 % (ref 11.5–15.5)
WBC: 8.6 10*3/uL (ref 4.0–10.5)
nRBC: 0 % (ref 0.0–0.2)

## 2024-04-26 LAB — PROTIME-INR
INR: 1.1 (ref 0.8–1.2)
Prothrombin Time: 14.1 s (ref 11.4–15.2)

## 2024-04-26 LAB — APTT: aPTT: 47 s — ABNORMAL HIGH (ref 24–36)

## 2024-04-26 LAB — SURGICAL PCR SCREEN
MRSA, PCR: NEGATIVE
Staphylococcus aureus: NEGATIVE

## 2024-04-26 LAB — TYPE AND SCREEN
ABO/RH(D): O POS
Antibody Screen: NEGATIVE

## 2024-04-26 LAB — GLUCOSE, CAPILLARY
Glucose-Capillary: 88 mg/dL (ref 70–99)
Glucose-Capillary: 141 mg/dL — ABNORMAL HIGH (ref 70–99)

## 2024-04-26 LAB — ABO/RH: ABO/RH(D): O POS

## 2024-04-26 SURGERY — INSERTION, STENT, ARTERY, ILIAC
Anesthesia: General | Site: Leg Lower | Laterality: Left

## 2024-04-26 MED ORDER — PHENOL 1.4 % MT LIQD: 1.0000 | OROMUCOSAL | Status: DC | PRN

## 2024-04-26 MED ORDER — PANTOPRAZOLE SODIUM 40 MG PO TBEC
40.0000 mg | DELAYED_RELEASE_TABLET | Freq: Every day | ORAL | Status: DC
  Administered 2024-04-26 – 2024-04-27 (×2): 40 mg via ORAL
  Filled 2024-04-26 (×2): qty 1

## 2024-04-26 MED ORDER — ALBUMIN HUMAN 5 % IV SOLN
INTRAVENOUS | Status: AC
Start: 2024-04-26 — End: ?
  Filled 2024-04-26: qty 250

## 2024-04-26 MED ORDER — MORPHINE SULFATE (PF) 2 MG/ML IV SOLN: 1.0000 mg | INTRAVENOUS | Status: DC | PRN

## 2024-04-26 MED ORDER — DOCUSATE SODIUM 100 MG PO CAPS
100.0000 mg | ORAL_CAPSULE | Freq: Every day | ORAL | Status: DC
  Administered 2024-04-27: 100 mg via ORAL
  Filled 2024-04-26: qty 1

## 2024-04-26 MED ORDER — ONDANSETRON HCL 4 MG/2ML IJ SOLN: 4.0000 mg | Freq: Four times a day (QID) | INTRAMUSCULAR | Status: DC | PRN

## 2024-04-26 MED ORDER — GUAIFENESIN-DM 100-10 MG/5ML PO SYRP: 15.0000 mL | ORAL_SOLUTION | ORAL | Status: DC | PRN

## 2024-04-26 MED ORDER — HEPARIN SODIUM (PORCINE) 1000 UNIT/ML IJ SOLN
INTRAMUSCULAR | Status: AC
  Filled 2024-04-26: qty 10

## 2024-04-26 MED ORDER — ALUM & MAG HYDROXIDE-SIMETH 200-200-20 MG/5ML PO SUSP: 15.0000 mL | ORAL | Status: DC | PRN

## 2024-04-26 MED ORDER — HEPARIN SODIUM (PORCINE) 1000 UNIT/ML IJ SOLN
INTRAMUSCULAR | Status: DC | PRN
  Administered 2024-04-26: 2000 [IU] via INTRAVENOUS
  Administered 2024-04-26: 3000 [IU] via INTRAVENOUS
  Administered 2024-04-26: 6000 [IU] via INTRAVENOUS

## 2024-04-26 MED ORDER — ROCURONIUM BROMIDE 10 MG/ML (PF) SYRINGE
PREFILLED_SYRINGE | INTRAVENOUS | Status: AC
  Filled 2024-04-26: qty 10

## 2024-04-26 MED ORDER — ONDANSETRON HCL 4 MG/2ML IJ SOLN
INTRAMUSCULAR | Status: AC
  Filled 2024-04-26: qty 2

## 2024-04-26 MED ORDER — PROTAMINE SULFATE 10 MG/ML IV SOLN
INTRAVENOUS | Status: DC | PRN
  Administered 2024-04-26: 50 mg via INTRAVENOUS

## 2024-04-26 MED ORDER — PHENYLEPHRINE 80 MCG/ML (10ML) SYRINGE FOR IV PUSH (FOR BLOOD PRESSURE SUPPORT)
PREFILLED_SYRINGE | INTRAVENOUS | Status: DC | PRN
Start: 2024-04-26 — End: 2024-04-26
  Administered 2024-04-26: 80 ug via INTRAVENOUS
  Administered 2024-04-26: 160 ug via INTRAVENOUS

## 2024-04-26 MED ORDER — CHLORHEXIDINE GLUCONATE 0.12 % MT SOLN
15.0000 mL | Freq: Once | OROMUCOSAL | Status: AC
  Administered 2024-04-26: 15 mL via OROMUCOSAL
  Filled 2024-04-26: qty 15

## 2024-04-26 MED ORDER — FENTANYL CITRATE (PF) 250 MCG/5ML IJ SOLN
INTRAMUSCULAR | Status: AC
  Filled 2024-04-26: qty 5

## 2024-04-26 MED ORDER — CHLORHEXIDINE GLUCONATE CLOTH 2 % EX PADS
6.0000 | MEDICATED_PAD | Freq: Once | CUTANEOUS | Status: DC
Start: 2024-04-26 — End: 2024-04-26

## 2024-04-26 MED ORDER — POTASSIUM CHLORIDE CRYS ER 20 MEQ PO TBCR
20.0000 meq | EXTENDED_RELEASE_TABLET | Freq: Every day | ORAL | Status: DC
  Administered 2024-04-27 – 2024-04-28 (×2): 20 meq via ORAL
  Filled 2024-04-26 (×2): qty 1

## 2024-04-26 MED ORDER — PHENYLEPHRINE HCL-NACL 20-0.9 MG/250ML-% IV SOLN
INTRAVENOUS | Status: DC | PRN
  Administered 2024-04-26: 45 ug/min via INTRAVENOUS

## 2024-04-26 MED ORDER — SODIUM CHLORIDE 0.9 % IV SOLN: 500.0000 mL | Freq: Once | INTRAVENOUS | Status: DC | PRN

## 2024-04-26 MED ORDER — LACTATED RINGERS IV SOLN: INTRAVENOUS | Status: DC

## 2024-04-26 MED ORDER — DEXAMETHASONE SODIUM PHOSPHATE 10 MG/ML IJ SOLN
INTRAMUSCULAR | Status: DC | PRN
Start: 2024-04-26 — End: 2024-04-26
  Administered 2024-04-26: 5 mg via INTRAVENOUS

## 2024-04-26 MED ORDER — CLOPIDOGREL BISULFATE 300 MG PO TABS
300.0000 mg | ORAL_TABLET | Freq: Once | ORAL | Status: AC
Start: 2024-04-26 — End: 2024-04-26
  Administered 2024-04-26: 300 mg via ORAL
  Filled 2024-04-26: qty 1

## 2024-04-26 MED ORDER — ALBUTEROL SULFATE HFA 108 (90 BASE) MCG/ACT IN AERS: 2.0000 | INHALATION_SPRAY | Freq: Four times a day (QID) | RESPIRATORY_TRACT | Status: DC | PRN

## 2024-04-26 MED ORDER — SODIUM CHLORIDE 0.9 % IV SOLN: INTRAVENOUS | Status: DC

## 2024-04-26 MED ORDER — HEPARIN 6000 UNIT IRRIGATION SOLUTION
Status: DC | PRN
  Administered 2024-04-26: 1

## 2024-04-26 MED ORDER — BISACODYL 5 MG PO TBEC: 5.0000 mg | DELAYED_RELEASE_TABLET | Freq: Every day | ORAL | Status: DC | PRN

## 2024-04-26 MED ORDER — HYDRALAZINE HCL 20 MG/ML IJ SOLN: 5.0000 mg | INTRAMUSCULAR | Status: DC | PRN

## 2024-04-26 MED ORDER — ORAL CARE MOUTH RINSE: 15.0000 mL | Freq: Once | OROMUCOSAL | Status: AC

## 2024-04-26 MED ORDER — MAGNESIUM SULFATE 2 GM/50ML IV SOLN: 2.0000 g | Freq: Every day | INTRAVENOUS | Status: DC | PRN

## 2024-04-26 MED ORDER — FENTANYL CITRATE (PF) 250 MCG/5ML IJ SOLN
INTRAMUSCULAR | Status: DC | PRN
  Administered 2024-04-26 (×2): 25 ug via INTRAVENOUS
  Administered 2024-04-26: 75 ug via INTRAVENOUS
  Administered 2024-04-26: 50 ug via INTRAVENOUS
  Administered 2024-04-26: 25 ug via INTRAVENOUS
  Administered 2024-04-26: 50 ug via INTRAVENOUS

## 2024-04-26 MED ORDER — NEBIVOLOL HCL 10 MG PO TABS
10.0000 mg | ORAL_TABLET | Freq: Every day | ORAL | Status: DC
  Administered 2024-04-27 – 2024-04-28 (×2): 10 mg via ORAL
  Filled 2024-04-26 (×2): qty 1

## 2024-04-26 MED ORDER — METOPROLOL TARTRATE 5 MG/5ML IV SOLN: 2.0000 mg | INTRAVENOUS | Status: DC | PRN

## 2024-04-26 MED ORDER — BUDESON-GLYCOPYRROL-FORMOTEROL 160-9-4.8 MCG/ACT IN AERO: 2.0000 | INHALATION_SPRAY | Freq: Two times a day (BID) | RESPIRATORY_TRACT | Status: DC | PRN

## 2024-04-26 MED ORDER — NEBIVOLOL HCL 5 MG PO TABS
5.0000 mg | ORAL_TABLET | Freq: Every day | ORAL | Status: DC
  Administered 2024-04-27 – 2024-04-28 (×2): 5 mg via ORAL
  Filled 2024-04-26 (×2): qty 1

## 2024-04-26 MED ORDER — HEPARIN 6000 UNIT IRRIGATION SOLUTION
Status: AC
  Filled 2024-04-26: qty 500

## 2024-04-26 MED ORDER — PROPOFOL 10 MG/ML IV BOLUS
INTRAVENOUS | Status: DC | PRN
  Administered 2024-04-26: 100 mg via INTRAVENOUS
  Administered 2024-04-26: 20 mg via INTRAVENOUS

## 2024-04-26 MED ORDER — ALBUMIN HUMAN 5 % IV SOLN
INTRAVENOUS | Status: DC | PRN
Start: 2024-04-26 — End: 2024-04-26

## 2024-04-26 MED ORDER — HEMOSTATIC AGENTS (NO CHARGE) OPTIME
TOPICAL | Status: DC | PRN
  Administered 2024-04-26: 1 via TOPICAL

## 2024-04-26 MED ORDER — OXYCODONE HCL 5 MG PO TABS
5.0000 mg | ORAL_TABLET | ORAL | Status: DC | PRN
  Administered 2024-04-26: 5 mg via ORAL
  Administered 2024-04-27: 10 mg via ORAL
  Filled 2024-04-26: qty 1
  Filled 2024-04-26: qty 2

## 2024-04-26 MED ORDER — CHLORHEXIDINE GLUCONATE CLOTH 2 % EX PADS: 6.0000 | MEDICATED_PAD | Freq: Once | CUTANEOUS | Status: DC

## 2024-04-26 MED ORDER — LIDOCAINE 2% (20 MG/ML) 5 ML SYRINGE
INTRAMUSCULAR | Status: DC | PRN
  Administered 2024-04-26: 40 mg via INTRAVENOUS

## 2024-04-26 MED ORDER — ACETAMINOPHEN 325 MG PO TABS: 325.0000 mg | ORAL_TABLET | ORAL | Status: DC | PRN

## 2024-04-26 MED ORDER — EPHEDRINE SULFATE-NACL 50-0.9 MG/10ML-% IV SOSY
PREFILLED_SYRINGE | INTRAVENOUS | Status: DC | PRN
  Administered 2024-04-26 (×4): 5 mg via INTRAVENOUS
  Administered 2024-04-26: 2.5 mg via INTRAVENOUS

## 2024-04-26 MED ORDER — NICOTINE 14 MG/24HR TD PT24
14.0000 mg | MEDICATED_PATCH | Freq: Every day | TRANSDERMAL | Status: DC
  Filled 2024-04-26: qty 1

## 2024-04-26 MED ORDER — ALBUMIN HUMAN 5 % IV SOLN
12.5000 g | Freq: Once | INTRAVENOUS | Status: AC
  Administered 2024-04-26: 12.5 g via INTRAVENOUS

## 2024-04-26 MED ORDER — CEFAZOLIN SODIUM-DEXTROSE 2-4 GM/100ML-% IV SOLN
2.0000 g | Freq: Three times a day (TID) | INTRAVENOUS | Status: AC
  Administered 2024-04-26 – 2024-04-27 (×2): 2 g via INTRAVENOUS
  Filled 2024-04-26 (×2): qty 100

## 2024-04-26 MED ORDER — EPHEDRINE 5 MG/ML INJ
INTRAVENOUS | Status: AC
  Filled 2024-04-26: qty 5

## 2024-04-26 MED ORDER — PROTAMINE SULFATE 10 MG/ML IV SOLN
INTRAVENOUS | Status: AC
  Filled 2024-04-26: qty 5

## 2024-04-26 MED ORDER — POTASSIUM CHLORIDE CRYS ER 20 MEQ PO TBCR: 20.0000 meq | EXTENDED_RELEASE_TABLET | Freq: Every day | ORAL | Status: DC | PRN

## 2024-04-26 MED ORDER — ROCURONIUM BROMIDE 10 MG/ML (PF) SYRINGE
PREFILLED_SYRINGE | INTRAVENOUS | Status: DC | PRN
Start: 2024-04-26 — End: 2024-04-26
  Administered 2024-04-26: 30 mg via INTRAVENOUS
  Administered 2024-04-26: 20 mg via INTRAVENOUS
  Administered 2024-04-26: 50 mg via INTRAVENOUS
  Administered 2024-04-26: 10 mg via INTRAVENOUS

## 2024-04-26 MED ORDER — ACETAMINOPHEN 10 MG/ML IV SOLN
INTRAVENOUS | Status: AC
  Filled 2024-04-26: qty 100

## 2024-04-26 MED ORDER — ASPIRIN 81 MG PO TBEC
81.0000 mg | DELAYED_RELEASE_TABLET | Freq: Every day | ORAL | Status: DC
  Administered 2024-04-27 – 2024-04-28 (×2): 81 mg via ORAL
  Filled 2024-04-26 (×2): qty 1

## 2024-04-26 MED ORDER — ROSUVASTATIN CALCIUM 20 MG PO TABS
40.0000 mg | ORAL_TABLET | Freq: Every day | ORAL | Status: DC
  Administered 2024-04-26 – 2024-04-28 (×3): 40 mg via ORAL
  Filled 2024-04-26 (×3): qty 2

## 2024-04-26 MED ORDER — ACETAMINOPHEN 10 MG/ML IV SOLN
INTRAVENOUS | Status: DC | PRN
  Administered 2024-04-26: 1000 mg via INTRAVENOUS

## 2024-04-26 MED ORDER — INSULIN ASPART 100 UNIT/ML IJ SOLN
0.0000 [IU] | Freq: Three times a day (TID) | INTRAMUSCULAR | Status: DC
  Administered 2024-04-27: 2 [IU] via SUBCUTANEOUS

## 2024-04-26 MED ORDER — CEFAZOLIN SODIUM-DEXTROSE 2-4 GM/100ML-% IV SOLN
2.0000 g | INTRAVENOUS | Status: AC
  Administered 2024-04-26: 2 g via INTRAVENOUS
  Filled 2024-04-26: qty 100

## 2024-04-26 MED ORDER — PROPOFOL 10 MG/ML IV BOLUS
INTRAVENOUS | Status: AC
  Filled 2024-04-26: qty 20

## 2024-04-26 MED ORDER — 0.9 % SODIUM CHLORIDE (POUR BTL) OPTIME
TOPICAL | Status: DC | PRN
  Administered 2024-04-26: 2000 mL

## 2024-04-26 MED ORDER — SUGAMMADEX SODIUM 200 MG/2ML IV SOLN
INTRAVENOUS | Status: DC | PRN
  Administered 2024-04-26: 200 mg via INTRAVENOUS

## 2024-04-26 MED ORDER — CLOPIDOGREL BISULFATE 75 MG PO TABS
75.0000 mg | ORAL_TABLET | Freq: Every day | ORAL | Status: DC
  Administered 2024-04-27 – 2024-04-28 (×2): 75 mg via ORAL
  Filled 2024-04-26 (×2): qty 1

## 2024-04-26 MED ORDER — LABETALOL HCL 5 MG/ML IV SOLN: 10.0000 mg | INTRAVENOUS | Status: DC | PRN

## 2024-04-26 MED ORDER — IODIXANOL 320 MG/ML IV SOLN
INTRAVENOUS | Status: DC | PRN
  Administered 2024-04-26: 8 mL via INTRA_ARTERIAL

## 2024-04-26 MED ORDER — ACETAMINOPHEN 325 MG RE SUPP: 325.0000 mg | RECTAL | Status: DC | PRN

## 2024-04-26 MED ORDER — HEPARIN SODIUM (PORCINE) 1000 UNIT/ML IJ SOLN
INTRAMUSCULAR | Status: AC
  Filled 2024-04-26: qty 1

## 2024-04-26 MED ORDER — DEXAMETHASONE SODIUM PHOSPHATE 10 MG/ML IJ SOLN
INTRAMUSCULAR | Status: AC
  Filled 2024-04-26: qty 1

## 2024-04-26 MED ORDER — ALBUTEROL SULFATE (2.5 MG/3ML) 0.083% IN NEBU: 2.5000 mg | INHALATION_SOLUTION | Freq: Four times a day (QID) | RESPIRATORY_TRACT | Status: DC | PRN

## 2024-04-26 MED ORDER — LIDOCAINE 2% (20 MG/ML) 5 ML SYRINGE
INTRAMUSCULAR | Status: AC
  Filled 2024-04-26: qty 5

## 2024-04-26 MED ORDER — POLYETHYLENE GLYCOL 3350 17 G PO PACK: 17.0000 g | PACK | Freq: Every day | ORAL | Status: DC | PRN

## 2024-04-26 MED ORDER — ONDANSETRON HCL 4 MG/2ML IJ SOLN
INTRAMUSCULAR | Status: DC | PRN
  Administered 2024-04-26: 4 mg via INTRAVENOUS

## 2024-04-26 SURGICAL SUPPLY — 73 items
BAG COUNTER SPONGE SURGICOUNT (BAG) ×3 IMPLANT
BALLOON MUSTANG 7.0X40 75 (BALLOONS) IMPLANT
BANDAGE ESMARK 6X9 LF (GAUZE/BANDAGES/DRESSINGS) IMPLANT
BENZOIN TINCTURE PRP APPL 2/3 (GAUZE/BANDAGES/DRESSINGS) ×3 IMPLANT
CANISTER SUCTION 3000ML PPV (SUCTIONS) ×3 IMPLANT
CANNULA VESSEL 3MM 2 BLNT TIP (CANNULA) ×6 IMPLANT
CATH BEACON 5 .035 65 KMP TIP (CATHETERS) IMPLANT
CATH OMNI FLUSH .035X70CM (CATHETERS) IMPLANT
CATH OMNI FLUSH 5F 65CM (CATHETERS) ×3 IMPLANT
CHLORAPREP W/TINT 26 (MISCELLANEOUS) ×6 IMPLANT
CLIP LIGATING EXTRA SM BLUE (MISCELLANEOUS) IMPLANT
CLSR STERI-STRIP ANTIMIC 1/2X4 (GAUZE/BANDAGES/DRESSINGS) IMPLANT
CUFF TOURN SGL QUICK 42 (TOURNIQUET CUFF) IMPLANT
CUFF TRNQT CYL 24X4X16.5-23 (TOURNIQUET CUFF) IMPLANT
CUFF TRNQT CYL 34X4.125X (TOURNIQUET CUFF) IMPLANT
DRAIN PENROSE 12X.25 LTX STRL (MISCELLANEOUS) IMPLANT
DRAPE C-ARM 42X72 X-RAY (DRAPES) IMPLANT
DRAPE HALF SHEET 40X57 (DRAPES) IMPLANT
DRAPE X-RAY CASS 24X20 (DRAPES) IMPLANT
DRSG TEGADERM 2-3/8X2-3/4 SM (GAUZE/BANDAGES/DRESSINGS) IMPLANT
DRSG TEGADERM 4X4.75 (GAUZE/BANDAGES/DRESSINGS) IMPLANT
ELECTRODE REM PT RTRN 9FT ADLT (ELECTROSURGICAL) ×3 IMPLANT
EVACUATOR SILICONE 100CC (DRAIN) IMPLANT
GAUZE SPONGE 4X4 12PLY STRL (GAUZE/BANDAGES/DRESSINGS) ×3 IMPLANT
GLIDEWIRE ADV .035X180CM (WIRE) IMPLANT
GLOVE BIO SURGEON STRL SZ8 (GLOVE) ×3 IMPLANT
GOWN STRL REUS W/ TWL LRG LVL3 (GOWN DISPOSABLE) ×6 IMPLANT
GOWN STRL REUS W/ TWL XL LVL3 (GOWN DISPOSABLE) ×3 IMPLANT
HEAD CUTTING VALVULOTOME LEMTR (VASCULAR PRODUCTS) IMPLANT
HEMOSTAT SNOW SURGICEL 2X4 (HEMOSTASIS) IMPLANT
INSERT FOGARTY SM (MISCELLANEOUS) IMPLANT
KIT BASIN OR (CUSTOM PROCEDURE TRAY) ×3 IMPLANT
KIT ENCORE 26 ADVANTAGE (KITS) IMPLANT
KIT TURNOVER KIT B (KITS) ×3 IMPLANT
LOOP VESSEL MINI RED (MISCELLANEOUS) IMPLANT
MARKER GRAFT CORONARY BYPASS (MISCELLANEOUS) IMPLANT
NS IRRIG 1000ML POUR BTL (IV SOLUTION) ×6 IMPLANT
PACK ENDO MINOR (CUSTOM PROCEDURE TRAY) ×3 IMPLANT
PACK PERIPHERAL VASCULAR (CUSTOM PROCEDURE TRAY) ×3 IMPLANT
PAD ARMBOARD POSITIONER FOAM (MISCELLANEOUS) ×6 IMPLANT
PENCIL SMOKE EVACUATOR (MISCELLANEOUS) IMPLANT
SET COLLECT BLD 21X3/4 12 (NEEDLE) IMPLANT
SET MICROPUNCTURE 5F STIFF (MISCELLANEOUS) ×3 IMPLANT
SET WALTER ACTIVATION W/DRAPE (SET/KITS/TRAYS/PACK) IMPLANT
SHEATH PINNACLE 5F 10CM (SHEATH) ×3 IMPLANT
SHEATH PINNACLE 6F 10CM (SHEATH) IMPLANT
SHEATH PINNACLE 8F 10CM (SHEATH) ×3 IMPLANT
SPONGE T-LAP 18X18 ~~LOC~~+RFID (SPONGE) IMPLANT
STENT ZILVER PTX 8X40 (Permanent Stent) IMPLANT
STOPCOCK 4 WAY LG BORE MALE ST (IV SETS) IMPLANT
STOPCOCK MORSE 400PSI 3WAY (MISCELLANEOUS) ×3 IMPLANT
STRIP CLOSURE SKIN 1/2X4 (GAUZE/BANDAGES/DRESSINGS) ×6 IMPLANT
SUT ETHILON 3 0 PS 1 (SUTURE) IMPLANT
SUT MNCRL AB 4-0 PS2 18 (SUTURE) ×6 IMPLANT
SUT PROLENE 5 0 C 1 24 (SUTURE) ×3 IMPLANT
SUT PROLENE 6 0 BV (SUTURE) ×3 IMPLANT
SUT SILK 2 0 SH (SUTURE) ×3 IMPLANT
SUT SILK 3-0 18XBRD TIE 12 (SUTURE) IMPLANT
SUT VIC AB 2-0 CT1 TAPERPNT 27 (SUTURE) ×6 IMPLANT
SUT VIC AB 3-0 SH 27X BRD (SUTURE) ×6 IMPLANT
SYR 10ML LL (SYRINGE) IMPLANT
SYR 30ML LL (SYRINGE) IMPLANT
SYR MEDRAD MARK V 150ML (SYRINGE) ×3 IMPLANT
TAPE UMBILICAL 1/8X30 (MISCELLANEOUS) IMPLANT
TOWEL GREEN STERILE (TOWEL DISPOSABLE) ×3 IMPLANT
TRAY FOLEY MTR SLVR 16FR STAT (SET/KITS/TRAYS/PACK) ×3 IMPLANT
TUBING HIGH PRESSURE 120CM (CONNECTOR) ×3 IMPLANT
UNDERPAD 30X36 HEAVY ABSORB (UNDERPADS AND DIAPERS) ×3 IMPLANT
VALVULOTOME HEAD CUTTING LEMTR (VASCULAR PRODUCTS) IMPLANT
WATER STERILE IRR 1000ML POUR (IV SOLUTION) ×3 IMPLANT
WIRE AMPLATZ SS-J .035X180CM (WIRE) IMPLANT
WIRE AMPLATZ SS-J .035X260CM (WIRE) IMPLANT
WIRE BENTSON .035X145CM (WIRE) ×3 IMPLANT

## 2024-04-26 NOTE — Anesthesia Procedure Notes (Signed)
 Arterial Line Insertion Start/End5/14/2025 10:35 AM, 04/26/2024 10:40 AM Performed by: Raymund Calix, CRNA, CRNA  Patient location: Pre-op. Preanesthetic checklist: patient identified, IV checked, site marked, risks and benefits discussed, surgical consent, monitors and equipment checked, pre-op evaluation, timeout performed and anesthesia consent Lidocaine  1% used for infiltration Right, radial was placed Catheter size: 20 G Hand hygiene performed , maximum sterile barriers used  and Seldinger technique used Allen's test indicative of satisfactory collateral circulation Attempts: 1 Procedure performed without using ultrasound guided technique. Following insertion, dressing applied. Post procedure assessment: normal and unchanged  Patient tolerated the procedure well with no immediate complications.

## 2024-04-26 NOTE — Interval H&P Note (Signed)
 History and Physical Interval Note:  04/26/2024 11:10 AM  Luis Salinas  has presented today for surgery, with the diagnosis of ATHROSCLEROSIS OF LLE WITH ULCER.  The various methods of treatment have been discussed with the patient and family. After consideration of risks, benefits and other options for treatment, the patient has consented to  Procedure(s): INSERTION, STENT, ARTERY, ILIAC (Left) BYPASS GRAFT FEMORAL-POPLITEAL ARTERY (Left) as a surgical intervention.  The patient's history has been reviewed, patient examined, no change in status, stable for surgery.  I have reviewed the patient's chart and labs.  Questions were answered to the patient's satisfaction.     Carlene Che

## 2024-04-26 NOTE — Transfer of Care (Signed)
 Immediate Anesthesia Transfer of Care Note  Patient: Luis Salinas  Procedure(s) Performed: INSERTION, EXTERNAL ILIAC STENT, ARTERY (Left) BYPASS GRAFT FEMORAL-POPLITEAL ARTERY WITH LEFT GREATER SAPHENOUS VEIN HARVEST (Left) ENDARTERECTOMY, FEMORAL WITH ANGIOPLASTY (Left) ANGIOGRAM, LOWER EXTREMITY (Left: Leg Lower)  Patient Location: PACU  Anesthesia Type:General  Level of Consciousness: awake and alert   Airway & Oxygen  Therapy: Patient Spontanous Breathing and Patient connected to face mask oxygen   Post-op Assessment: Report given to RN and Post -op Vital signs reviewed and stable  Post vital signs: Reviewed and stable  Last Vitals:  Vitals Value Taken Time  BP 98/72 04/26/24 1635  Temp 36.4 C 04/26/24 1635  Pulse 67 04/26/24 1637  Resp 15 04/26/24 1637  SpO2 97 % 04/26/24 1637  Vitals shown include unfiled device data.  Last Pain:  Vitals:   04/26/24 1014  TempSrc:   PainSc: 8       Patients Stated Pain Goal: 3 (04/26/24 1014)  Complications: No notable events documented.

## 2024-04-26 NOTE — Op Note (Signed)
 DATE OF SERVICE: 04/26/2024  PATIENT:  Luis Salinas  78 y.o. 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  POST-OPERATIVE DIAGNOSIS:  Same  PROCEDURE:   1) harvest left greater saphenous vein 2) left iliofemoral endarterectomy and saphenous vein patch angioplasty 3) left common femoral to below-knee popliteal artery bypass with reversed left greater saphenous vein in subfascial tunnel 4) left external iliac artery angioplasty and stenting (8 x 40 mm Zilver) 5) left lower extremity angiogram  SURGEON:  Surgeons and Role:    * Carlene Che, MD - Primary    * Kayla Part, MD - Assisting  ASSISTANT: Deneise Finlay, PA-C  An experienced assistant was required given the complexity of this procedure and the standard of surgical care. My assistant helped with exposure through counter tension, suctioning, ligation and retraction to better visualize the surgical field.  My assistant expedited sewing during the case by following my sutures. Wherever I use the term "we" in the report, my assistant actively helped me with that portion of the procedure.  ANESTHESIA:   general  EBL:  BLOOD ADMINISTERED:none  DRAINS: none   LOCAL MEDICATIONS USED:  NONE  SPECIMEN:  none  COUNTS: confirmed correct.  TOURNIQUET:  none  PATIENT DISPOSITION:  PACU - hemodynamically stable.   Delay start of Pharmacological VTE agent (>24hrs) due to surgical blood loss or risk of bleeding: no  INDICATION FOR PROCEDURE: [name] is a 78 y.o. male with left foot ischemic ulceration of the fourth interdigital space. Preoperative angiogram showed left external iliac stenosis; common femoral occlusion and SFA occlusion. After careful discussion of risks, benefits, and alternatives the patient was offered left external iliac stenting, femoral endarterectomy and femoral popliteal bypass. The patient understood and wished to  proceed.  OPERATIVE FINDINGS:  Greater than 80% stenosis in the left external iliac artery, with good response to angioplasty and stenting. Occlusive plaque in the common femoral artery as expected. Left below-knee popliteal artery more disease than anticipated, but adequate distal bypass target. Palpable dorsalis pedis pulse at completion.  This was confirmed with Doppler exam.  Doppler flow was graft dependent.  DESCRIPTION OF PROCEDURE: After identification of the patient in the pre-operative holding area, the patient was transferred to the operating room. The patient was positioned supine on the operating room table. Anesthesia was induced. The left leg was prepped and draped in standard fashion. A surgical pause was performed confirming correct patient, procedure, and operative location.  Left leg was evaluated with intraoperative ultrasound.  The greater saphenous vein was marked on the skin to allow for harvest through skip incisions.  A longitudinal incision was made over the left common femoral artery and carried down through subtenons tissue until the femoral sheath was encountered.  This was entered carefully.  The common femoral artery was skeletonized.  We carried exposure under the inguinal ligament to a healthy segment of mid external iliac artery.  The circumflex iliac vein was identified, and ligated between silk ligature.  A healthy clamp site was identified in the mid external iliac artery.  The circumflex iliac branches were encircled with Silastic Vesseloops.  The exposure was carried down onto the first-order bifurcation of the profunda femoris artery.  Both the anterior and posterior division were encircled with Silastic Vesseloops.  The superficial femoral artery was encircled with Silastic Vesseloops.  Skip incisions were made over the course of the greater saphenous vein in the left leg to expose  the greater saphenous vein.  The incision was carried down through subtenons  tissue into the vein was identified and circumflex exposed.  All sidebranches, when identified, were ligated with silk suture and divided.  The vein was freed from the saphenofemoral junction to the mid calf.  The distal margin was clamped with a right angle.  It was divided here.  Silk was applied to the right ankle clamp and secured.  The saphenofemoral junction was clamped with a Gregory clamp.  The vein was divided and passed off the table to allow to dwell in a heparinized saline solution.  The saphenofemoral junction was then closed with a 2 layer closure with 5-0 Prolene.  The below-knee popliteal artery was exposed through a longitudinal incision already made to expose the greater saphenous vein.  The incision was carried down to the superficial posterior fascia of the calf.  The fascia was divided in the gastrocnemius swept posteriorly.  The popliteal vascular bundle was encountered.  The popliteal artery below the knee was skeletonized and encircled with Silastic Vesseloops proximally and distally.  A Kelly-Wick tunneler was used to connect the 2 vascular exposures.  An anatomic tunnel was created under the sartorius muscle.  No difficulty was encountered during tunneling.  The patient was systemically heparinized.  Activated clotting time measurements were used at the case to confirm adequate anticoagulation.  Micropuncture access was obtained and a healthy segment of proximal common femoral artery.  Access was upsized to a 6 [place] sheath.  Intraoperative fluoroscopy was used to confirm safe navigation of a Glidewire advantage across the known left external iliac artery lesion.  A retrograde angiogram was performed.  The anatomy was defined.  An 8 x 40 mm Zilver self-expanding drug-eluting stent was deployed across the lesion with good effect.  The lesion was postdilated with a 7 x 40 mm Mustang balloon.  Postintervention angiogram showed significant improvement in the external iliac lesion.  All  endovascular equipment was removed.  Clamps were applied to the left external iliac artery, profunda femoris artery branches, and superficial femoral artery.  A anterior arteriotomy was made on the healthy external iliac artery and carried down through the common femoral artery across the origin of the profunda femoris artery onto a healthy segment of profunda femoris artery.  Extensive iliofemoral endarterectomy and profundoplasty was then performed using a Astronomer.  Good backbleeding was achieved from the anterior and posterior division of the profunda femoris artery.  Torrential inflow was achieved from the external iliac artery.  A segment of saphenous vein was divided and incised longitudinally to serve as a patch angioplasty for the iliofemoral endarterectomy.  This was sewn to the arteriotomy using continuous running suture of 5-0 Prolene.  Prior to completion the repair was flushed and de-aired.  Clamps were slowly released.  Hemostasis was achieved.  Clamps were reapplied to the iliofemoral exposure.  A arteriotomy was made and the vein patch require.  The remaining segment of the saphenous vein was reversed.  The proximal aspect of the graft was then spatulated and sewn to the arteriotomy end-to-side using continuous running suture of 6-0 Prolene.  The anastomosis was flushed on the open end of the vascular graft.  Hemostasis was confirmed in the vascular graft.  Excellent flow was noted through the bypass graft.  The graft was delivered through the anatomic tunnel using the Kelly-Wick tunneler and a silk suture.  Careful attention was made to avoid twisting or kinking the graft.  Clamp was applied to the  graft.  Hemostasis was achieved.  The distal aspect of the vascular graft was spatulated to allow end-to-side anastomosis to the below-knee popliteal artery.  The below-knee popliteal artery was clamped proximally and distally.  An arteriotomy was made with an 11 blade and extended with  Potts scissors.  The distal aspect of the vascular graft was sewn end-to-side to the below-knee popliteal artery using continuous running suture of 6-0 Prolene.  Prior to completion the anastomosis was flushed and de-aired.  The anastomosis was completed.  Hemostasis was achieved.  The repair was evaluated with Doppler machine.  Brisk Doppler flow was heard distal to the repair.  Brisk Doppler flow was heard in the anterior tibial artery.  A palpable dorsalis pedis pulse was noted.  Doppler flow in the anterior tibial artery was graft dependent.  Satisfied we ended the case here.  Heparin  was reversed with protamine.    The wounds were closed in layers using 2-0 Vicryl, 3-0 Vicryl, 4-0 Monocryl.  Benzoin and Steri-Strips were applied to the incisions.  Clean bandages were applied to the incisions.  Upon completion of the case instrument and sharps counts were confirmed correct. The patient was transferred to the PACU in good condition. I was present for all portions of the procedure.  FOLLOW UP PLAN: Assuming a normal postoperative course, VVS PA will see the patient in 4 weeks with ABI and left lower extremity arterial duplex.   Heber Little. Edgardo Goodwill, MD [facility] Phone Number: [phone] 04/26/2024 4:14 PM

## 2024-04-26 NOTE — Anesthesia Procedure Notes (Addendum)
 Procedure Name: Intubation Date/Time: 04/26/2024 11:51 AM  Performed by: Rochelle Chu, CRNAPre-anesthesia Checklist: Patient identified, Emergency Drugs available, Suction available and Patient being monitored Patient Re-evaluated:Patient Re-evaluated prior to induction Oxygen  Delivery Method: Circle System Utilized Preoxygenation: Pre-oxygenation with 100% oxygen  Induction Type: IV induction Ventilation: Mask ventilation without difficulty and Oral airway inserted - appropriate to patient size Laryngoscope Size: Mac and 4 Grade View: Grade I Tube type: Oral Tube size: 7.5 mm Number of attempts: 1 Airway Equipment and Method: Stylet and Oral airway Placement Confirmation: ETT inserted through vocal cords under direct vision, positive ETCO2 and breath sounds checked- equal and bilateral Secured at: 23 cm Tube secured with: Tape Dental Injury: Teeth and Oropharynx as per pre-operative assessment

## 2024-04-27 ENCOUNTER — Encounter (HOSPITAL_COMMUNITY): Payer: Self-pay | Admitting: Vascular Surgery

## 2024-04-27 DIAGNOSIS — Z9889 Other specified postprocedural states: Secondary | ICD-10-CM

## 2024-04-27 LAB — CBC
HCT: 31.5 % — ABNORMAL LOW (ref 39.0–52.0)
Hemoglobin: 10.4 g/dL — ABNORMAL LOW (ref 13.0–17.0)
MCH: 29.8 pg (ref 26.0–34.0)
MCHC: 33 g/dL (ref 30.0–36.0)
MCV: 90.3 fL (ref 80.0–100.0)
Platelets: 135 10*3/uL — ABNORMAL LOW (ref 150–400)
RBC: 3.49 MIL/uL — ABNORMAL LOW (ref 4.22–5.81)
RDW: 14.1 % (ref 11.5–15.5)
WBC: 12.1 10*3/uL — ABNORMAL HIGH (ref 4.0–10.5)
nRBC: 0 % (ref 0.0–0.2)

## 2024-04-27 LAB — BASIC METABOLIC PANEL WITH GFR
Anion gap: 7 (ref 5–15)
BUN: 19 mg/dL (ref 8–23)
CO2: 28 mmol/L (ref 22–32)
Calcium: 8.6 mg/dL — ABNORMAL LOW (ref 8.9–10.3)
Chloride: 104 mmol/L (ref 98–111)
Creatinine, Ser: 1.14 mg/dL (ref 0.61–1.24)
GFR, Estimated: >60 mL/min (ref >60)
Glucose, Bld: 132 mg/dL — ABNORMAL HIGH (ref 70–99)
Potassium: 4.1 mmol/L (ref 3.5–5.1)
Sodium: 139 mmol/L (ref 135–145)

## 2024-04-27 LAB — GLUCOSE, CAPILLARY
Glucose-Capillary: 139 mg/dL — ABNORMAL HIGH (ref 70–99)
Glucose-Capillary: 110 mg/dL — ABNORMAL HIGH (ref 70–99)
Glucose-Capillary: 154 mg/dL — ABNORMAL HIGH (ref 70–99)

## 2024-04-27 LAB — POCT ACTIVATED CLOTTING TIME
Activated Clotting Time: 227 s
Activated Clotting Time: 262 s
Activated Clotting Time: 227 s
Activated Clotting Time: 268 s

## 2024-04-27 NOTE — Anesthesia Postprocedure Evaluation (Signed)
 Anesthesia Post Note  Patient: Luis Salinas  Procedure(s) Performed: INSERTION, EXTERNAL ILIAC STENT, ARTERY (Left) BYPASS GRAFT FEMORAL-POPLITEAL ARTERY WITH LEFT GREATER SAPHENOUS VEIN HARVEST (Left) ENDARTERECTOMY, FEMORAL WITH ANGIOPLASTY (Left) ANGIOGRAM, LOWER EXTREMITY (Left: Leg Lower)     Patient location during evaluation: PACU Anesthesia Type: General Level of consciousness: awake and alert Pain management: pain level controlled Vital Signs Assessment: post-procedure vital signs reviewed and stable Respiratory status: spontaneous breathing, nonlabored ventilation, respiratory function stable and patient connected to nasal cannula oxygen  Cardiovascular status: blood pressure returned to baseline and stable Postop Assessment: no apparent nausea or vomiting Anesthetic complications: no   No notable events documented.          Zuhair Lariccia D Dakisha Schoof

## 2024-04-27 NOTE — Progress Notes (Signed)
  Progress Note    04/27/2024 11:17 AM 1 Day Post-Op  Subjective:  no complaints   Vitals:   04/27/24 0506 04/27/24 0851  BP: (!) 96/50 (!) 89/48  Pulse: 65 63  Resp: 18 18  Temp: 98.2 F (36.8 C) 98.1 F (36.7 C)  SpO2: 96% 95%   Physical Exam: Lungs:  non labored Incisions:  incisions are soft without bleeding Extremities:  palpable L DP pulse Neurologic: A&O  CBC    Component Value Date/Time   WBC 8.6 04/26/2024 0917   RBC 4.96 04/26/2024 0917   HGB 14.4 04/26/2024 0917   HGB 13.5 03/02/2024 1019   HCT 45.7 04/26/2024 0917   HCT 41.6 03/02/2024 1019   PLT 169 04/26/2024 0917   PLT 190 03/02/2024 1019   MCV 92.1 04/26/2024 0917   MCV 90 03/02/2024 1019   MCH 29.0 04/26/2024 0917   MCHC 31.5 04/26/2024 0917   RDW 14.1 04/26/2024 0917   RDW 13.3 03/02/2024 1019   LYMPHSABS 2.0 03/02/2024 1019   MONOABS 0.5 09/19/2021 1718   EOSABS 0.3 03/02/2024 1019   BASOSABS 0.1 03/02/2024 1019    BMET    Component Value Date/Time   NA 142 04/26/2024 0917   NA 142 03/02/2024 1019   K 4.2 04/26/2024 0917   CL 104 04/26/2024 0917   CO2 29 04/26/2024 0917   GLUCOSE 104 (H) 04/26/2024 0917   BUN 19 04/26/2024 0917   BUN 14 03/02/2024 1019   CREATININE 1.04 04/26/2024 0917   CREATININE 0.81 05/01/2013 1302   CALCIUM  9.4 04/26/2024 0917   GFRNONAA >60 04/26/2024 0917   GFRNONAA >89 05/01/2013 1302   GFRAA 85 11/22/2020 0842   GFRAA >89 05/01/2013 1302    INR    Component Value Date/Time   INR 1.1 04/26/2024 0917     Intake/Output Summary (Last 24 hours) at 04/27/2024 1117 Last data filed at 04/27/2024 0200 Gross per 24 hour  Intake 2450 ml  Output 505 ml  Net 1945 ml     Assessment/Plan:  78 y.o. male is s/p L iliofemoral endarterectomy with femoral to popliteal bypass with stenting of L EIA 1 Day Post-Op   L foot well perfused with palpable DP pulse Incisions are well appearing OOB with therapy today Continue asa/plavix ; possibly restart Eliquis   tomorrow Home as early as tomorrow   Cordie Deters, PA-C [facility] [phone] 04/27/2024 11:17 AM

## 2024-04-27 NOTE — Progress Notes (Signed)
 Pharmacy lipid protocol:   Patient's lipid panel not drawn this AM. Patient has already eaten per RN. Will reschedule lipid panel for AM labs 5/16.   Vikash Nest A. Brynn Caras, PharmD, BCPS, FNKF Clinical Pharmacist [place] Please utilize Amion for appropriate phone number to reach the unit pharmacist [facility])

## 2024-04-27 NOTE — Plan of Care (Signed)

## 2024-04-27 NOTE — Evaluation (Addendum)
 Physical Therapy Evaluation Patient Details Name: Luis Salinas MRN: [id number] DOB: Feb 13, 1946 Today's Date: 04/27/2024  History of Present Illness  Pt is a 78 year old man admitted on 04/26/24 for L LE iliac stent and fem-pop BPG. Pt with non healing L 4th toe ulceration. 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  Clinical Impression  Pt in bed upon arrival and agreeable to PT eval. PTA, pt was independent with no AD for mobility. In today's session, pt required CGA to stand and ambulate 120 ft with no AD. Pt with antalgic gait pattern due to pain in L foot. Pt was slightly unsteady, however, declined use of cane of RW. He reports he was ambulating close to his baseline as he has had pain in L foot for multiple weeks. Pt was also able to ascend/descend 3 steps with CGA for safety and use of 1HR. Pt will have intermittent assist at home upon d/c. Recommending OP PT to work on balance and work towards independence with mobility. Pt currently with functional limitations due to the deficits listed below (see PT Problem List). Pt would benefit from acute skilled PT to address functional impairments. Acute PT to follow.  85% on 3L during gait when pleth reading was accurate 92% on 3L with PLB         If plan is discharge home, recommend the following: A little help with walking and/or transfers;Assist for transportation;Help with stairs or ramp for entrance   Can travel by private vehicle    Yes    Equipment Recommendations None recommended by PT     Functional Status Assessment Patient has had a recent decline in their functional status and demonstrates the ability to make significant improvements in function in a reasonable and predictable amount of time.     Precautions / Restrictions Precautions Precautions: Fall Restrictions Weight Bearing Restrictions Per Provider Order: No      Mobility  Bed Mobility Overal bed mobility: Modified  Independent    General bed mobility comments: HOB, pt sleeps in a recliner    Transfers Overall transfer level: Needs assistance   Transfers: Sit to/from Stand Sit to Stand: Contact guard assist    General transfer comment: CGA for safety    Ambulation/Gait Ambulation/Gait assistance: Contact guard assist Gait Distance (Feet): 120 Feet Assistive device: None Gait Pattern/deviations: Step-through pattern, Decreased step length - left, Decreased stance time - left, Antalgic Gait velocity: decr     General Gait Details: antalgic gait pattern due to pain in L foot, slightly unsteady with no AD however no LOB. Pt declined using SP cane or RW  Stairs Stairs: Yes Stairs assistance: Contact guard assist Stair Management: One rail Right, Forwards, Backwards Number of Stairs: 3 General stair comments: steady with use of 1 UE, used portable step in the hallway    Balance Overall balance assessment: Needs assistance Sitting-balance support: No upper extremity supported, Feet supported Sitting balance-Leahy Scale: Good     Standing balance support: No upper extremity supported, During functional activity Standing balance-Leahy Scale: Fair Standing balance comment: able to stand statically with no AD          Pertinent Vitals/Pain Pain Assessment Pain Assessment: Faces Faces Pain Scale: Hurts little more Pain Location: L foot Pain Descriptors / Indicators: Discomfort, Sore Pain Intervention(s): Limited activity within patient's tolerance, Monitored during session, Repositioned    Home Living Family/patient expects to be discharged to:: Private residence Living Arrangements:  Spouse/significant other Available Help at Discharge: Family;Available PRN/intermittently Type of Home: House Home Access: Stairs to enter Entrance Stairs-Rails: None Entrance Stairs-Number of Steps: 2   Home Layout: One level Home Equipment: Cane - single point;Shower seat;Hand held shower  head;Other (comment) (4L O2)      Prior Function Prior Level of Function : Independent/Modified Independent      Mobility Comments: Ind with no AD       Extremity/Trunk Assessment   Upper Extremity Assessment Upper Extremity Assessment: Defer to OT evaluation    Lower Extremity Assessment Lower Extremity Assessment: LLE deficits/detail LLE Deficits / Details: pain in L foot with movement and WB. WFL ROM with formal MMT deferred 2/2 pain    Cervical / Trunk Assessment Cervical / Trunk Assessment: Kyphotic  Communication   Communication Communication: No apparent difficulties    Cognition Arousal: Alert Behavior During Therapy: WFL for tasks assessed/performed   PT - Cognitive impairments: No apparent impairments    Following commands: Intact       Cueing Cueing Techniques: Verbal cues         PT Assessment Patient needs continued PT services  PT Problem List Decreased activity tolerance;Decreased balance;Decreased mobility;Pain       PT Treatment Interventions DME instruction;Gait training;Stair training;Functional mobility training;Therapeutic activities;Therapeutic exercise;Balance training;Neuromuscular re-education;Patient/family education    PT Goals (Current goals can be found in the Care Plan section)  Acute Rehab PT Goals Patient Stated Goal: to have less pain when walking PT Goal Formulation: With patient Time For Goal Achievement: 05/11/24 Potential to Achieve Goals: Good    Frequency Min 1X/week        AM-PAC PT "6 Clicks" Mobility  Outcome Measure Help needed turning from your back to your side while in a flat bed without using bedrails?: None Help needed moving from lying on your back to sitting on the side of a flat bed without using bedrails?: None Help needed moving to and from a bed to a chair (including a wheelchair)?: A Little Help needed standing up from a chair using your arms (e.g., wheelchair or bedside chair)?: A Little Help  needed to walk in hospital room?: A Little Help needed climbing 3-5 steps with a railing? : A Little 6 Click Score: 20    End of Session Equipment Utilized During Treatment: Gait belt;Oxygen  Activity Tolerance: Patient tolerated treatment well Patient left: in bed;with call bell/phone within reach Nurse Communication: Mobility status PT Visit Diagnosis: Unsteadiness on feet (R26.81);Other abnormalities of gait and mobility (R26.89)    Time: 1610-9604 PT Time Calculation (min) (ACUTE ONLY): 16 min   Charges:   PT Evaluation $PT Eval Low Complexity: 1 Low   PT General Charges $$ ACUTE PT VISIT: 1 Visit        Orysia Blas, PT, DPT Secure Chat Preferred  Rehab Office [phone]   Alissa April Adela Ades 04/27/2024, 4:54 PM

## 2024-04-27 NOTE — Evaluation (Signed)
 Occupational Therapy Evaluation Patient Details Name: JANMICHAEL ELLETT MRN: [id number] DOB: 03-15-1946 Today's Date: 04/27/2024   History of Present Illness   Pt is a 78 year old man admitted on 04/26/24 for L LE iliac stent and fem-pop BPG. Pt with non healing L 4th toe ulceration. 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     Clinical Impressions Pt was independent in mobility and modified independent in self care (sitting to shower) prior to admission. He reports removing his O2 during the day. Ambulated pt with RW and supervision in room with SpO2 dropping to 76%, returned 4L O2 with recovery to 94% with pursed lip breathing. Educated pt in importance of wearing his O2 at all times. Pt does go outside to smoke his cigars, he is aware not to wear O2 around flames. He does not intend to stop smoking. Pt requires up to min assist for ADLs, unable to reach his L foot due to post operative pain. He plans to return home with his supportive wife who works when discharged. Do not anticipate need for post acute OT.      If plan is discharge home, recommend the following:   A little help with walking and/or transfers;A little help with bathing/dressing/bathroom;Help with stairs or ramp for entrance     Functional Status Assessment   Patient has had a recent decline in their functional status and demonstrates the ability to make significant improvements in function in a reasonable and predictable amount of time.     Equipment Recommendations   None recommended by OT     Recommendations for Other Services         Precautions/Restrictions   Precautions Precautions: Fall Restrictions Weight Bearing Restrictions Per Provider Order: No     Mobility Bed Mobility Overal bed mobility: Modified Independent             General bed mobility comments: HOB, pt sleeps in a recliner    Transfers Overall transfer level: Needs  assistance Equipment used: Rolling walker (2 wheels) Transfers: Sit to/from Stand Sit to Stand: Supervision           General transfer comment: cues for hand placement      Balance Overall balance assessment: Needs assistance   Sitting balance-Leahy Scale: Good     Standing balance support: No upper extremity supported, During functional activity Standing balance-Leahy Scale: Poor Standing balance comment: can release walker in static standing                           ADL either performed or assessed with clinical judgement   ADL Overall ADL's : Needs assistance/impaired Eating/Feeding: Independent   Grooming: Supervision/safety;Standing   Upper Body Bathing: Set up;Sitting   Lower Body Bathing: Minimal assistance;Sit to/from stand   Upper Body Dressing : Set up;Sitting   Lower Body Dressing: Minimal assistance;Sitting/lateral leans Lower Body Dressing Details (indicate cue type and reason): assist for L sock Toilet Transfer: Supervision/safety;Ambulation;Rolling walker (2 wheels)           Functional mobility during ADLs: Supervision/safety;Rolling walker (2 wheels) General ADL Comments: pt will rely on wife to assist with LB ADLs until he can resume on his own     Vision Ability to See in Adequate Light: 0 Adequate Patient Visual Report: No change from baseline       Perception  Praxis         Pertinent Vitals/Pain Pain Assessment Pain Assessment: Faces Faces Pain Scale: Hurts little more Pain Location: L LE Pain Descriptors / Indicators: Discomfort, Sore Pain Intervention(s): Repositioned, Monitored during session     Extremity/Trunk Assessment Upper Extremity Assessment Upper Extremity Assessment: Overall WFL for tasks assessed   Lower Extremity Assessment Lower Extremity Assessment: Defer to PT evaluation   Cervical / Trunk Assessment Cervical / Trunk Assessment: Kyphotic   Communication  Communication Communication: No apparent difficulties   Cognition Arousal: Alert Behavior During Therapy: WFL for tasks assessed/performed Cognition:  (STM loss at baseline)                               Following commands: Intact       Cueing  General Comments   Cueing Techniques: Verbal cues      Exercises     Shoulder Instructions      Home Living Family/patient expects to be discharged to:: Private residence Living Arrangements: Spouse/significant other Available Help at Discharge: Family;Available PRN/intermittently Type of Home: House Home Access: Stairs to enter [employer] of Steps: 2 Entrance Stairs-Rails: None Home Layout: One level     Bathroom Shower/Tub: Chief Strategy Officer: Standard     Home Equipment: Cane - single point;Shower seat;Hand held shower head;Other (comment) (4L O2)          Prior Functioning/Environment Prior Level of Function : Independent/Modified Independent                    OT Problem List: Impaired balance (sitting and/or standing);Pain   OT Treatment/Interventions:        OT Goals(Current goals can be found in the care plan section)   Acute Rehab OT Goals OT Goal Formulation: With patient Time For Goal Achievement: 05/11/24 Potential to Achieve Goals: Good ADL Goals Pt Will Transfer to Toilet: with modified independence;ambulating;regular height toilet Pt Will Perform Toileting - Clothing Manipulation and hygiene: with modified independence;sit to/from stand Pt Will Perform Tub/Shower Transfer: Tub transfer;with supervision;ambulating;shower seat   OT Frequency:       Co-evaluation              AM-PAC OT "6 Clicks" Daily Activity     Outcome Measure Help from another person eating meals?: None Help from another person taking care of personal grooming?: A Little Help from another person toileting, which includes using toliet, bedpan, or urinal?: A  Little Help from another person bathing (including washing, rinsing, drying)?: A Little Help from another person to put on and taking off regular upper body clothing?: None Help from another person to put on and taking off regular lower body clothing?: A Little 6 Click Score: 20   End of Session Equipment Utilized During Treatment: Rolling walker (2 wheels);Gait belt Nurse Communication: Mobility status  Activity Tolerance: Patient tolerated treatment well Patient left: in bed;with call bell/phone within reach;with family/visitor present  OT Visit Diagnosis: Unsteadiness on feet (R26.81);Pain                Time: 1610-9604 OT Time Calculation (min): 28 min Charges:  OT General Charges $OT Visit: 1 Visit OT Evaluation $OT Eval Low Complexity: 1 Low OT Treatments $Self Care/Home Management : 8-22 mins  Avanell Leigh, OTR/L Acute Rehabilitation Services Office: [phone]  Jonette Nestle 04/27/2024, 12:04 PM

## 2024-04-28 ENCOUNTER — Other Ambulatory Visit (HOSPITAL_COMMUNITY): Payer: Self-pay

## 2024-04-28 LAB — CBC
HCT: 31 % — ABNORMAL LOW (ref 39.0–52.0)
Hemoglobin: 10.5 g/dL — ABNORMAL LOW (ref 13.0–17.0)
MCH: 30.3 pg (ref 26.0–34.0)
MCHC: 33.9 g/dL (ref 30.0–36.0)
MCV: 89.3 fL (ref 80.0–100.0)
Platelets: 123 10*3/uL — ABNORMAL LOW (ref 150–400)
RBC: 3.47 MIL/uL — ABNORMAL LOW (ref 4.22–5.81)
RDW: 14.1 % (ref 11.5–15.5)
WBC: 11.6 10*3/uL — ABNORMAL HIGH (ref 4.0–10.5)
nRBC: 0 % (ref 0.0–0.2)

## 2024-04-28 LAB — BASIC METABOLIC PANEL WITH GFR
Anion gap: 6 (ref 5–15)
BUN: 16 mg/dL (ref 8–23)
CO2: 26 mmol/L (ref 22–32)
Calcium: 8.3 mg/dL — ABNORMAL LOW (ref 8.9–10.3)
Chloride: 104 mmol/L (ref 98–111)
Creatinine, Ser: 0.95 mg/dL (ref 0.61–1.24)
GFR, Estimated: >60 mL/min (ref >60)
Glucose, Bld: 111 mg/dL — ABNORMAL HIGH (ref 70–99)
Potassium: 3.9 mmol/L (ref 3.5–5.1)
Sodium: 136 mmol/L (ref 135–145)

## 2024-04-28 LAB — LIPID PANEL
Cholesterol: 74 mg/dL (ref 0–200)
HDL: 32 mg/dL — ABNORMAL LOW (ref >40)
LDL Cholesterol: 26 mg/dL (ref 0–99)
Total CHOL/HDL Ratio: 2.3 ratio
Triglycerides: 78 mg/dL (ref <150)
VLDL: 16 mg/dL (ref 0–40)

## 2024-04-28 LAB — GLUCOSE, CAPILLARY: Glucose-Capillary: 109 mg/dL — ABNORMAL HIGH (ref 70–99)

## 2024-04-28 MED ORDER — APIXABAN 5 MG PO TABS
5.0000 mg | ORAL_TABLET | Freq: Two times a day (BID) | ORAL | 3 refills | Status: AC
  Filled 2024-04-28: qty 180, 90d supply, fill #0

## 2024-04-28 MED ORDER — CLOPIDOGREL BISULFATE 75 MG PO TABS
75.0000 mg | ORAL_TABLET | Freq: Every day | ORAL | 2 refills | Status: DC
  Filled 2024-04-28: qty 30, 30d supply, fill #0

## 2024-04-28 MED ORDER — OXYCODONE HCL 5 MG PO TABS
5.0000 mg | ORAL_TABLET | Freq: Four times a day (QID) | ORAL | 0 refills | Status: DC | PRN
  Filled 2024-04-28: qty 20, 5d supply, fill #0

## 2024-04-28 NOTE — Discharge Instructions (Signed)
 [facility]  Discharge instructions  Lower Extremity Bypass Surgery  Please refer to the following instruction for your post-procedure care. Your surgeon or physician assistant will discuss any changes with you.  Activity  You are encouraged to walk as much as you can. You can slowly return to normal activities during the month after your surgery. Avoid strenuous activity and heavy lifting until your doctor tells you it's OK. Avoid activities such as vacuuming or swinging a golf club. Do not drive until your doctor give the OK and you are no longer taking prescription pain medications. It is also normal to have difficulty with sleep habits, eating and bowel movement after surgery. These will go away with time.  Bathing/Showering  You may shower after you go home. Do not soak in a bathtub, hot tub, or swim until the incision heals completely.  Incision Care  Clean your incision with mild soap and water. Shower every day. Pat the area dry with a clean towel. You do not need a bandage unless otherwise instructed. Do not apply any ointments or creams to your incision. If you have open wounds you will be instructed how to care for them or a visiting nurse may be arranged for you. If you have staples or sutures along your incision they will be removed at your post-op appointment. You may have skin glue on your incision. Do not peel it off. It will come off on its own in about one week. If you have a great deal of moisture in your groin, use a gauze help keep this area dry.  Diet  Resume your normal diet. There are no special food restrictions following this procedure. A low fat/ low cholesterol diet is recommended for all patients with vascular disease. In order to heal from your surgery, it is CRITICAL to get adequate nutrition. Your body requires vitamins, minerals, and protein. Vegetables are the best source of vitamins and minerals. Vegetables also provide the  perfect balance of protein. Processed food has little nutritional value, so try to avoid this.  Medications  Resume taking all your medications unless your doctor or nurse practitioner tells you not to. If your incision is causing pain, you may take over-the-counter pain relievers such as acetaminophen (Tylenol). If you were prescribed a stronger pain medication, please aware these medication can cause nausea and constipation. Prevent nausea by taking the medication with a snack or meal. Avoid constipation by drinking plenty of fluids and eating foods with high amount of fiber, such as fruits, vegetables, and grains. Take Colase 100 mg (an over-the-counter stool softener) twice a day as needed for constipation. Do not take Tylenol if you are taking prescription pain medications.  Follow Up  Our office will schedule a follow up appointment 2-3 weeks following discharge.  Please call us immediately for any of the following conditions  Severe or worsening pain in your legs or feet while at rest or while walking Increase pain, redness, warmth, or drainage (pus) from your incision site(s) Fever of 101 degree or higher The swelling in your leg with the bypass suddenly worsens and becomes more painful than when you were in the hospital If you have been instructed to feel your graft pulse then you should do so every day. If you can no longer feel this pulse, call the office immediately. Not all patients are given this instruction.  Leg swelling is common after leg bypass surgery.  The swelling should improve over a few months  following surgery. To improve the swelling, you may elevate your legs above the level of your heart while you are sitting or resting. Your surgeon or physician assistant may ask you to apply an ACE wrap or wear compression (TED) stockings to help to reduce swelling.  Reduce your risk of vascular disease  Stop smoking. If you would like help call [facility] at 1-800-QUIT-NOW  ((470) 654-9347) or [place] at (807)887-0074.  Manage your cholesterol Maintain a desired weight Control your diabetes weight Control your diabetes Keep your blood pressure down  If you have any questions, please call the office at [phone]

## 2024-04-28 NOTE — Progress Notes (Signed)
  Progress Note    04/28/2024 7:57 AM 2 Days Post-Op  Subjective:  no complaints.  Wants to go home   Vitals:   04/27/24 2300 04/28/24 0303  BP: (!) 88/47 (!) 98/58  Pulse: 68 66  Resp: 14 20  Temp: 98.2 F (36.8 C)   SpO2: 95% 92%   Physical Exam: Lungs:  non labored Incisions:  LLE incisions with blood on dressing but no active bleeding or oozing Extremities:  palpable L DP Neurologic: A&O  CBC    Component Value Date/Time   WBC 11.6 (H) 04/28/2024 0521   RBC 3.47 (L) 04/28/2024 0521   HGB 10.5 (L) 04/28/2024 0521   HGB 13.5 03/02/2024 1019   HCT 31.0 (L) 04/28/2024 0521   HCT 41.6 03/02/2024 1019   PLT 123 (L) 04/28/2024 0521   PLT 190 03/02/2024 1019   MCV 89.3 04/28/2024 0521   MCV 90 03/02/2024 1019   MCH 30.3 04/28/2024 0521   MCHC 33.9 04/28/2024 0521   RDW 14.1 04/28/2024 0521   RDW 13.3 03/02/2024 1019   LYMPHSABS 2.0 03/02/2024 1019   MONOABS 0.5 09/19/2021 1718   EOSABS 0.3 03/02/2024 1019   BASOSABS 0.1 03/02/2024 1019    BMET    Component Value Date/Time   NA 136 04/28/2024 0521   NA 142 03/02/2024 1019   K 3.9 04/28/2024 0521   CL 104 04/28/2024 0521   CO2 26 04/28/2024 0521   GLUCOSE 111 (H) 04/28/2024 0521   BUN 16 04/28/2024 0521   BUN 14 03/02/2024 1019   CREATININE 0.95 04/28/2024 0521   CREATININE 0.81 05/01/2013 1302   CALCIUM  8.3 (L) 04/28/2024 0521   GFRNONAA >60 04/28/2024 0521   GFRNONAA >89 05/01/2013 1302   GFRAA 85 11/22/2020 0842   GFRAA >89 05/01/2013 1302    INR    Component Value Date/Time   INR 1.1 04/26/2024 0917     Intake/Output Summary (Last 24 hours) at 04/28/2024 0757 Last data filed at 04/27/2024 2000 Gross per 24 hour  Intake 240 ml  Output 600 ml  Net -360 ml     Assessment/Plan:  78 y.o. male is s/p  L iliofemoral endarterectomy with femoral to popliteal bypass with stenting of L EIA   2 Days Post-Op   L foot well perfused with palpable DP pulse Incisions without ongoing bleeding or  oozing Ok for discharge home today on aspirin  and plavix ; he can resume Eliquis  for PAF next week   Cordie Deters, PA-C [facility] [phone] 04/28/2024 7:57 AM

## 2024-04-28 NOTE — Progress Notes (Signed)
 PHARMACIST LIPID MONITORING   Luis Salinas is a 78 y.o. male admitted on 04/26/2024 with Endarterectomy and stenting of the left leg.  Pharmacy has been consulted to optimize lipid-lowering therapy with the indication of secondary prevention for clinical ASCVD.  Recent Labs:  Lipid Panel (last 6 months):   Lab Results  Component Value Date   CHOL 74 04/28/2024   TRIG 78 04/28/2024   HDL 32 (L) 04/28/2024   CHOLHDL 2.3 04/28/2024   VLDL 16 04/28/2024   LDLCALC 26 04/28/2024    Hepatic function panel (last 6 months):   Lab Results  Component Value Date   AST 26 04/26/2024   ALT 23 04/26/2024   ALKPHOS 65 04/26/2024   BILITOT 0.6 04/26/2024    SCr (since admission):   Serum creatinine: 0.95 mg/dL 16/10/96 0454 Estimated creatinine clearance: 52.7 mL/min  Current therapy and lipid therapy tolerance Current lipid-lowering therapy: Crestor  Previous lipid-lowering therapies (if applicable): not known Documented or reported allergies or intolerances to lipid-lowering therapies (if applicable): None  Assessment:   Patient agrees with changes to lipid-lowering therapy; however, no changes are indicated at this time.  Plan:    1.Statin intensity (high intensity recommended for all patients regardless of the LDL):  No statin changes. The patient is already on a high intensity statin.  2.Add ezetimibe (if any one of the following):   Not indicated at this time.  3.Refer to lipid clinic:   No  4.Follow-up with:  Primary care provider - Dettinger, Lucio Sabin, MD  5.Follow-up labs after discharge:  No changes in lipid therapy, repeat a lipid panel in one year.       Ascencion Black, PharmD 04/28/2024, 7:18 AM

## 2024-04-28 NOTE — Progress Notes (Signed)
 DISCHARGE NOTE HOME PESACH HORBAL to be discharged Home per MD order. Discussed prescriptions and follow up appointments with the patient. Prescriptions given to patient; medication list explained in detail. Patient verbalized understanding.  Educated on wound care and treatments and follow ups.  Skin clean, dry and intact without evidence of skin break down, no evidence of skin tears noted. IV catheter discontinued intact. Site without signs and symptoms of complications. Dressing and pressure applied. Pt denies pain at the site currently. No complaints noted.  See LDA for incision left leg Patient free of lines, drains, and wounds.   An After Visit Summary (AVS) was printed and given to the patient. Patient escorted via wheelchair, and discharged home via private auto.  Tonda Francisco, RN

## 2024-05-01 ENCOUNTER — Telehealth: Payer: Self-pay | Admitting: *Deleted

## 2024-05-01 NOTE — Transitions of Care (Post Inpatient/ED Visit) (Signed)
   05/01/2024  Name: Luis Salinas MRN: [id number] DOB: Jul 09, 1946  Today's TOC FU Call Status: Today's TOC FU Call Status:: Unsuccessful Call (1st Attempt) Unsuccessful Call (1st Attempt) Date: 05/01/24  Attempted to reach the patient regarding the most recent Inpatient/ED visit.  Follow Up Plan: Additional outreach attempts will be made to reach the patient to complete the Transitions of Care (Post Inpatient/ED visit) call.   Arna Better RN, BSN [place]  [facility] RN Care Manager [phone]

## 2024-05-01 NOTE — Discharge Summary (Signed)
 Bypass Discharge Summary Patient ID: TAMAJ JURGENS [id number] 78 y.o. 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 Edgardo Goodwill on 04/26/2024 due to CLI with tissue loss.  The patient tolerated the procedure well and was admitted to the hospital postoperatively.  Throughout his hospital stay he maintained a palpable DP pulse at the foot.  He was started on Plavix  in addition to aspirin .  Eliquis  will be resumed on 05/03/2024 for atrial fibrillation.  He was discharged after an uneventful hospital stay on postoperative day #2.  He will follow-up in the office in about a month with imaging studies.  He was prescribed 2 to 3 days of narcotic pain medication for continued postoperative pain control.  He was discharged home in stable condition.  Consults: None  Treatments: surgery: Left iliofemoral endarterectomy with vein patch angioplasty and femoral to below the knee popliteal bypass with vein as well as left external iliac artery angioplasty and stenting by Dr. Edgardo Goodwill on 04/26/2024    Disposition: Discharge disposition: 01-Home or Self Care       - For [facility] Registry use ---  Post-op:  Wound infection: No  Graft infection: No  Transfusion: No   New  Arrhythmia: No Patency judged by: [ ]  Dopper only, [ ]  Palpable graft pulse, [ x] Palpable distal pulse, [ ]  ABI inc. > 0.15, [ ]  Duplex D/C Ambulatory Status: Ambulatory  Complications: MI: [ x] No, [ ]  Troponin only, [ ]  EKG or Clinical CHF: No Resp failure: [ x] none, [ ]  Pneumonia, [ ]  Ventilator Chg in renal function: [ x] none, [ ]  Inc. Cr > 0.5, [ ]  Temp. Dialysis, [ ]  Permanent dialysis Stroke: [ x] None, [ ]  Minor, [ ]  Major Return to OR: No  Reason for return to OR: [ ]  Bleeding, [ ]  Infection, [ ]  Thrombosis, [ ]  Revision  Discharge medications: Statin use:  Yes ASA use:  Yes Plavix  use:  Yes Beta blocker use: Yes Coumadin use: Yes, Eliquis     Patient Instructions:  Allergies as of 04/28/2024   No Known Allergies      Medication List     TAKE these medications    Accu-Chek Guide test strip Generic drug: glucose blood USE TO CHECK SUGAR UP TO 4 TIMES DAILY AS DIRECTED   Accu-Chek Softclix Lancets lancets SMARTSIG:Topical 1-4 Times Daily   acetaminophen  650 MG CR tablet Commonly known as: TYLENOL  Take 650-1,300 mg by mouth every 8 (eight) hours as needed for pain.   albuterol  108 (90 Base) MCG/ACT inhaler Commonly known as: VENTOLIN  HFA Inhale 2 puffs into the lungs every 6 (six) hours as needed for wheezing or shortness of breath.   apixaban  5 MG Tabs tablet Commonly known as: Eliquis  Take 1 tablet (5 mg total) by mouth 2 (two) times daily. Start taking on: May 03, 2024 What changed: These instructions start on May 03, 2024. If you are unsure what to do until then, ask your doctor or other care provider.   aspirin  EC 81 MG tablet Commonly known as: [facility] Aspirin  Low Dose Take 1 tablet (81 mg total) by mouth daily.   b complex vitamins capsule Take 1 capsule by mouth daily.   blood glucose meter kit and supplies Kit Dispense based on patient and insurance preference. Use up to four times daily as directed.   Breztri  Aerosphere 160-9-4.8 MCG/ACT  Aero inhaler Generic drug: budesonide -glycopyrrolate-formoterol  Inhale 2 puffs into the lungs 2 (two) times daily. What changed:  when to take this reasons to take this   clopidogrel  75 MG tablet Commonly known as: PLAVIX  Take 1 tablet (75 mg total) by mouth daily at 6 (six) AM.   diphenhydramine-acetaminophen  25-500 MG Tabs tablet Commonly known as: TYLENOL  PM Take 1-2 tablets by mouth at bedtime as needed (sleep/pain.).   furosemide  40 MG tablet Commonly known as: LASIX  Take 1 tablet (40 mg total) by mouth daily.   nebivolol  10 MG tablet Commonly known as: BYSTOLIC  Take 1 tablet (10 mg total) by mouth daily.   nebivolol  5 MG tablet Commonly known as: BYSTOLIC  TAKE 1 TABLET (5 MG TOTAL) BY MOUTH DAILY. WITH 10 MG TABLET TO EQUAL 15 MG DAILY   nitroGLYCERIN  0.4 MG SL tablet Commonly known as: NITROSTAT  Place 1 tablet (0.4 mg total) under the tongue every 5 (five) minutes as needed for chest pain.   OVER THE COUNTER MEDICATION Apply 1 application  topically daily. neilmed wound wash on foot   OVER THE COUNTER MEDICATION Apply 1 application  topically 4 (four) times daily as needed (pain). frankincense & myrrh on foot   oxyCODONE  5 MG immediate release tablet Commonly known as: Oxy IR/ROXICODONE  Take 1 tablet (5 mg total) by mouth every 6 (six) hours as needed for moderate pain (pain score 4-6).   OXYGEN  Inhale 4 L into the lungs daily.   potassium chloride  SA 20 MEQ tablet Commonly known as: Klor-Con  M20 Take 1 tablet (20 mEq total) by mouth daily.   PreserVision AREDS 2 Caps Take 1 capsule by mouth daily.   rosuvastatin  40 MG tablet Commonly known as: CRESTOR  Take 1 tablet (40 mg total) by mouth daily. What changed: when to take this       Activity: activity as tolerated Diet: regular diet Wound Care: keep wound clean and dry  Follow-up with [facility] in 1 month.  SignedCordie Deters 05/01/2024 1:19 PM

## 2024-05-02 ENCOUNTER — Telehealth: Payer: Self-pay

## 2024-05-02 NOTE — Transitions of Care (Post Inpatient/ED Visit) (Signed)
 05/02/2024  Name: Luis Salinas MRN: [id number] DOB: 10-01-1946  Today's TOC FU Call Status: Today's TOC FU Call Status:: Successful TOC FU Call Completed TOC FU Call Complete Date:  (Wife completed TOC Call prior to answering assessment questions, TOC RN asked about SDOH and patient got very upset that [facility] RN asked about financial concerns and wife advised it's better not to enroll patient in [facility] because he is upset) Patient's Name and Date of Birth confirmed.  Transition Care Management Follow-up Telephone Call How have you been since you were released from the hospital?: Better Any questions or concerns?: No  Items Reviewed: Did you receive and understand the discharge instructions provided?: Yes Medications obtained,verified, and reconciled?: Yes (Medications Reviewed) Any new allergies since your discharge?: No Dietary orders reviewed?: Yes Type of Diet Ordered:: A low fat/ low cholesterol diet is  recommended for all patients with vascular disease Do you have support at home?: Yes People in Home [RPT]: spouse Name of Support/Comfort Primary Source: wife assists as needed  Medications Reviewed Today: Medications Reviewed Today     Reviewed by Sharmaine Dearth, RN (Registered Nurse) on 05/02/24 at 1514  Med List Status: <None>   Medication Order Taking? Sig Documenting Provider Last Dose Status Informant  ACCU-CHEK GUIDE test strip [id number] Yes USE TO CHECK SUGAR UP TO 4 TIMES DAILY AS DIRECTED [provider] Taking Active Self  Accu-Chek Softclix Lancets lancets [id number] Yes SMARTSIG:Topical 1-4 Times Daily [provider] Taking Active Self  acetaminophen  (TYLENOL ) 650 MG CR tablet [id number] Yes Take 650-1,300 mg by mouth every 8 (eight) hours as needed for pain. [provider] Taking Active Self  albuterol  (VENTOLIN  HFA) 108 (90 Base) MCG/ACT inhaler [id number] Yes Inhale 2 puffs into the lungs every 6 (six) hours as needed for wheezing  or shortness of breath. Dettinger, Lucio Sabin, MD Taking Active Self  apixaban  (ELIQUIS ) 5 MG TABS tablet [id number] Yes Take 1 tablet (5 mg total) by mouth 2 (two) times daily. Cordie Deters, PA-C Taking Active   aspirin  EC ([employer] ASPIRIN  LOW DOSE) 81 MG tablet [id number] Yes Take 1 tablet (81 mg total) by mouth daily. Dettinger, Lucio Sabin, MD Taking Active Self  b complex vitamins capsule [id number] Yes Take 1 capsule by mouth daily. [provider] Taking Active Self  blood glucose meter kit and supplies KIT [id number] Yes Dispense based on patient and insurance preference. Use up to four times daily as directed. Ezenduka, Nkeiruka J, MD Taking Active Self  Budeson-Glycopyrrol-Formoterol  (BREZTRI  AEROSPHERE) 160-9-4.8 MCG/ACT Sudie Ely [id number] Yes Inhale 2 puffs into the lungs 2 (two) times daily.  Patient taking differently: Inhale 2 puffs into the lungs 2 (two) times daily as needed.   Dettinger, Lucio Sabin, MD Taking Active Self  clopidogrel  (PLAVIX ) 75 MG tablet [id number] Yes Take 1 tablet (75 mg total) by mouth daily at 6 (six) AM. Cordie Deters, PA-C Taking Active   diphenhydramine-acetaminophen  (TYLENOL  PM) 25-500 MG TABS tablet [id number] Yes Take 1-2 tablets by mouth at bedtime as needed (sleep/pain.). [provider] Taking Active Self  furosemide  (LASIX ) 40 MG tablet [id number] Yes Take 1 tablet (40 mg total) by mouth daily. Dettinger, Lucio Sabin, MD Taking Active Self  Multiple Vitamins-Minerals (PRESERVISION AREDS 2) CAPS [id number] Yes Take 1 capsule by mouth daily. [provider] Taking Active Self  nebivolol  (BYSTOLIC ) 10 MG tablet [id number] Yes Take 1 tablet (10 mg total) by mouth daily. [name], MD Taking Active Self  nebivolol  (BYSTOLIC ) 5 MG  tablet [id number] Yes TAKE 1 TABLET (5 MG TOTAL) BY MOUTH DAILY. WITH 10 MG TABLET TO EQUAL 15 MG DAILY [name], MD Taking Active Self  nitroGLYCERIN  (NITROSTAT ) 0.4 MG SL tablet [id number] Yes  Place 1 tablet (0.4 mg total) under the tongue every 5 (five) minutes as needed for chest pain. Dettinger, Lucio Sabin, MD Taking Active Self  OVER THE COUNTER MEDICATION [id number] Yes Apply 1 application  topically daily. neilmed wound wash on foot [provider] Taking Active Self  OVER THE COUNTER MEDICATION [id number] Yes Apply 1 application  topically 4 (four) times daily as needed (pain). frankincense & myrrh on foot [provider] Taking Active Self  oxyCODONE  (OXY IR/ROXICODONE ) 5 MG immediate release tablet [id number] Yes Take 1 tablet (5 mg total) by mouth every 6 (six) hours as needed for moderate pain (pain score 4-6). Natalia Bailer Taking Active   OXYGEN  [id number] Yes Inhale 4 L into the lungs daily. [provider] Taking Active Self  potassium chloride  SA (KLOR-CON  M20) 20 MEQ tablet [id number] Yes Take 1 tablet (20 mEq total) by mouth daily. Dettinger, Lucio Sabin, MD Taking Active Self  rosuvastatin  (CRESTOR ) 40 MG tablet [id number] Yes Take 1 tablet (40 mg total) by mouth daily.  Patient taking differently: Take 40 mg by mouth at bedtime.   Dettinger, Lucio Sabin, MD Taking Active Self            Home Care and Equipment/Supplies: Were Home Health Services Ordered?: No Any new equipment or medical supplies ordered?: No  Functional Questionnaire: Do you need assistance with bathing/showering or dressing?: Yes (Wife is assisting as needed) Do you need assistance with meal preparation?: Yes (Wife is assisting as needed) Do you need assistance with eating?: No Do you have difficulty maintaining continence: No Do you need assistance with getting out of bed/getting out of a chair/moving?: No Do you have difficulty managing or taking your medications?: Yes (wife manages medications - fills pill box)  Follow up appointments reviewed: PCP Follow-up appointment confirmed?: No (TOC RN attempted to schedule via Care guide who was unable to schedule with PCP  and patient's wife only wants patient to see PCP and will call to see if he can be fit in for hospital follow up) MD Provider Line Number:[phone] Given: No Specialist Hospital Follow-up appointment confirmed?: Yes Date of Specialist follow-up appointment?: 05/30/24 Follow-Up Specialty Provider:: vascular Do you need transportation to your follow-up appointment?: No (wife will get patient to his appointments) Do you understand care options if your condition(s) worsen?: Yes-patient verbalized understanding  SDOH Interventions Today    Flowsheet Row Most Recent Value  SDOH Interventions   Food Insecurity Interventions Patient Declined  Housing Interventions Patient Declined  Transportation Interventions Intervention Not Indicated  Utilities Interventions Intervention Not Indicated      Tonia Frankel RN, CCM [facility]  VBCI-Population Health RN Care Manager [phone]

## 2024-05-05 DIAGNOSIS — J449 Chronic obstructive pulmonary disease, unspecified: Secondary | ICD-10-CM | POA: Diagnosis not present

## 2024-05-10 ENCOUNTER — Other Ambulatory Visit: Payer: Self-pay | Admitting: Vascular Surgery

## 2024-05-10 DIAGNOSIS — I739 Peripheral vascular disease, unspecified: Secondary | ICD-10-CM

## 2024-05-10 DIAGNOSIS — I70245 Atherosclerosis of native arteries of left leg with ulceration of other part of foot: Secondary | ICD-10-CM

## 2024-05-11 ENCOUNTER — Encounter: Payer: Self-pay | Admitting: Family Medicine

## 2024-05-11 ENCOUNTER — Ambulatory Visit: Admitting: Family Medicine

## 2024-05-11 VITALS — BP 94/55 | HR 62 | Temp 98.3°F | Ht 67.0 in | Wt 123.8 lb

## 2024-05-11 DIAGNOSIS — I739 Peripheral vascular disease, unspecified: Secondary | ICD-10-CM | POA: Diagnosis not present

## 2024-05-11 DIAGNOSIS — I480 Paroxysmal atrial fibrillation: Secondary | ICD-10-CM

## 2024-05-13 NOTE — Progress Notes (Signed)
 Subjective:  [name], male    DOB: 08-Mar-1946  Age: 78 y.o. MRN: [id number]  CC: Hospitalization Follow-up   HPI Luis Salinas presents for follow up of recent hospitalization for LLE fem-pop bypass and left external iliac artery angioplasty with stenting. Done on 5/14 by Dr. Edgardo Goodwill. Doing well so far. Pt. Denies redness swelling and pain..   Atrial fibrillation follow up. Pt. is treated with rate control and anticoagulation. Pt.  denies palpitations, rapid rate, chest pain, dyspnea and edema. There has been no bleeding from nose or gums. Pt. has not noticed blood with urine or stool.  Although there is routine bruising easily, it is not excessive.      03/02/2024    9:52 AM 12/03/2023    8:37 AM 12/03/2023    8:36 AM  Depression screen PHQ 2/9  Decreased Interest 0  0  Down, Depressed, Hopeless 0  0  PHQ - 2 Score 0  0  Altered sleeping   0  Tired, decreased energy  3 0  Change in appetite   0  Feeling bad or failure about yourself    0  Trouble concentrating   0  Moving slowly or fidgety/restless  3 0  Suicidal thoughts   0  PHQ-9 Score   0  Difficult doing work/chores  Somewhat difficult Not difficult at all    History Aren has a past medical history of Amnesia (1967), Arthritis, Cataract, Chronic diastolic CHF (congestive heart failure) (HCC), COPD (chronic obstructive pulmonary disease) (HCC), Dyslipidemia, History of concussion, History of STEMI, Hypertension, On home oxygen  therapy, Paroxysmal atrial fibrillation (HCC), Pneumonia, Stroke (HCC), Tobacco abuse, and Type 2 diabetes mellitus (HCC).   He has a past surgical history that includes Cardiac catheterization; Coronary angioplasty; Tonsillectomy; Rotator cuff repair; Colonoscopy w/ polypectomy; Anterior cervical decomp/discectomy fusion (05/16/2012); Joint replacement; Joint replacement; Eye surgery; Cardioversion (N/A, 11/10/2021); ABDOMINAL AORTOGRAM (N/A, 04/21/2024); Lower Extremity Angiography  (N/A, 04/21/2024); LOWER EXTREMITY INTERVENTION (N/A, 04/21/2024); Insertion of iliac stent (Left, 04/26/2024); Femoral-popliteal Bypass Graft (Left, 04/26/2024); Endarterectomy femoral (Left, 04/26/2024); and lower extremity angiogram (Left, 04/26/2024).   His family history includes Aneurysm in his father; COPD in his father; Heart disease in his mother.He reports that he has been smoking cigars. He has never used smokeless tobacco. He reports current drug use. Drug: Marijuana. He reports that he does not drink alcohol.    ROS Review of Systems  Constitutional:  Negative for fever.  Respiratory:  Negative for shortness of breath.   Cardiovascular:  Negative for chest pain.  Musculoskeletal:  Negative for arthralgias.  Skin:  Negative for rash.    Objective:  BP (!) 94/55   Pulse 62   Temp 98.3 F (36.8 C)   Ht 5\' 7"  (1.702 m)   Wt 123 lb 12.8 oz (56.2 kg)   SpO2 90%   BMI 19.39 kg/m   BP Readings from Last 3 Encounters:  05/11/24 (!) 94/55  04/28/24 (!) 95/43  04/21/24 (!) 98/55    Wt Readings from Last 3 Encounters:  05/11/24 123 lb 12.8 oz (56.2 kg)  04/26/24 128 lb (58.1 kg)  04/21/24 128 lb (58.1 kg)     Physical Exam Vitals reviewed.  Constitutional:      Appearance: He is well-developed.  HENT:     Head: Normocephalic and atraumatic.     Right Ear: External ear normal.     Left Ear: External ear normal.     Mouth/Throat:     Pharynx:  No oropharyngeal exudate or posterior oropharyngeal erythema.  Eyes:     Pupils: Pupils are equal, round, and reactive to light.  Cardiovascular:     Rate and Rhythm: Normal rate and regular rhythm.     Heart sounds: No murmur heard. Pulmonary:     Effort: No respiratory distress.     Breath sounds: Normal breath sounds.  Musculoskeletal:     Cervical back: Normal range of motion and neck supple.  Skin:    Findings: Lesion (Linear surgical scar at the left leg running the length of the leg. Steristrips loose, but in place.  Another set is caovering a groin incision) present.  Neurological:     Mental Status: He is alert and oriented to person, place, and time.      Assessment & Plan:  Paroxysmal atrial fibrillation (HCC)  Peripheral arterial disease (HCC)  Pt. Continuing to heal. Doing well no complications.    Follow-up: Return if symptoms worsen or fail to improve.  Roise Cleaver, M.D.

## 2024-05-18 DIAGNOSIS — L84 Corns and callosities: Secondary | ICD-10-CM | POA: Diagnosis not present

## 2024-05-18 DIAGNOSIS — M79676 Pain in unspecified toe(s): Secondary | ICD-10-CM | POA: Diagnosis not present

## 2024-05-18 DIAGNOSIS — I70203 Unspecified atherosclerosis of native arteries of extremities, bilateral legs: Secondary | ICD-10-CM | POA: Diagnosis not present

## 2024-05-18 DIAGNOSIS — B351 Tinea unguium: Secondary | ICD-10-CM | POA: Diagnosis not present

## 2024-05-23 ENCOUNTER — Other Ambulatory Visit (HOSPITAL_COMMUNITY): Payer: Self-pay

## 2024-05-30 ENCOUNTER — Ambulatory Visit (HOSPITAL_COMMUNITY)
Admission: RE | Admit: 2024-05-30 | Discharge: 2024-05-30 | Disposition: A | Discharge status: Home / Self Care | Source: Ambulatory Visit | Attending: Vascular Surgery | Admitting: Vascular Surgery

## 2024-05-30 DIAGNOSIS — I70245 Atherosclerosis of native arteries of left leg with ulceration of other part of foot: Secondary | ICD-10-CM | POA: Diagnosis not present

## 2024-05-30 DIAGNOSIS — I739 Peripheral vascular disease, unspecified: Secondary | ICD-10-CM | POA: Insufficient documentation

## 2024-05-30 LAB — VAS US ABI WITH/WO TBI
Left ABI: 0.9
Right ABI: 0.42

## 2024-06-02 ENCOUNTER — Encounter: Payer: Self-pay | Admitting: Family Medicine

## 2024-06-02 ENCOUNTER — Ambulatory Visit: Admitting: Family Medicine

## 2024-06-02 VITALS — BP 100/60 | HR 69 | Ht 67.0 in | Wt 125.0 lb

## 2024-06-02 DIAGNOSIS — I10 Essential (primary) hypertension: Secondary | ICD-10-CM

## 2024-06-02 DIAGNOSIS — E1169 Type 2 diabetes mellitus with other specified complication: Secondary | ICD-10-CM | POA: Diagnosis not present

## 2024-06-02 DIAGNOSIS — Z23 Encounter for immunization: Secondary | ICD-10-CM

## 2024-06-02 DIAGNOSIS — E782 Mixed hyperlipidemia: Secondary | ICD-10-CM

## 2024-06-02 LAB — BAYER DCA HB A1C WAIVED: HB A1C (BAYER DCA - WAIVED): 5.9 % — ABNORMAL HIGH (ref 4.8–5.6)

## 2024-06-02 NOTE — Progress Notes (Signed)
 Established Patient Office Visit  Subjective   [name], male    DOB: Feb 10, 1946  Age: 78 y.o. MRN: [id number]  Chief Complaint  Patient presents with   Medical Management of Chronic Issues   Diabetes   HPI  (1) T2DM Patient is present to clinic for a 6 month F/U. Patient is not on any medications, 5.9 A1C today. Historically was taking steroids to help with COPD, which may have caused his sugar levels to stay elevated in the past. Has stopped steroid and lost weight ever since. No changes noted in weight, urinary pattern, energy levels, or feet. Denies any new questions or concerns.   (2) HTN Patient is present to clinic for a 6 month F/U. Patient is currently taking Nebivolol  and Furosemide . Reports that their blood pressure has been running normal when they check it at home occasionally. Patient denies any lightheadedness or dizziness. Patient denies headaches, blurred vision, chest pains, shortness of breath, or weakness. Denies any side effects from medication and denies any new questions or concerns.   (3) Hyperlipidemia  Patient is present to clinic for a 6 month F/U. Patient is currently taking Rosuvastatin . They deny any issues with myalgias or history of liver damage from it. They deny any focal numbness or weakness or chest pain. Denies new questions or concerns.  (4) COPD Patient is present to clinic for a 6 month F/U. Patient is currently taking Breztri  and Albuterol . He reports mild exacerbation of his chronic cough and some SOB due to hot and humid weather. However, he reports that his symptoms have been well controlled with medications and denies any major issues. No new questions or concerns reported.    Review of Systems  Constitutional:  Negative for chills, fever, malaise/fatigue and weight loss.  HENT:  Negative for congestion, hearing loss and sore throat.   Eyes:  Negative for blurred vision.  Respiratory:  Positive for shortness of breath  (Increased temperature and humidity aggravates the COPD.) and wheezing. Negative for cough.   Cardiovascular:  Negative for chest pain, palpitations and leg swelling.  Gastrointestinal:  Negative for abdominal pain, constipation, diarrhea, nausea and vomiting.  Genitourinary:  Negative for dysuria, frequency and urgency.  Musculoskeletal:  Negative for joint pain and myalgias.  Skin:  Negative for itching and rash.  Neurological:  Negative for dizziness, sensory change, focal weakness and headaches.  Psychiatric/Behavioral:  Negative for depression.       Objective:     BP 100/60   Pulse 69   Ht 5' 7 (1.702 m)   Wt 125 lb (56.7 kg)   SpO2 (!) 88%   BMI 19.58 kg/m     Physical Exam Constitutional:      General: He is not in acute distress.    Appearance: Normal appearance. He is normal weight.  HENT:     Head: Normocephalic and atraumatic.   Cardiovascular:     Rate and Rhythm: Normal rate and regular rhythm.     Pulses: Normal pulses.     Heart sounds: Normal heart sounds. No murmur heard.    No gallop.  Pulmonary:     Effort: Pulmonary effort is normal. No respiratory distress.     Breath sounds: Wheezing (Bilateral expiratory wheezing.) present.  Abdominal:     General: Abdomen is flat.     Palpations: Abdomen is soft.     Tenderness: There is no abdominal tenderness.   Musculoskeletal:        General: Normal range  of motion.   Skin:    General: Skin is warm and dry.   Neurological:     General: No focal deficit present.     Mental Status: He is alert and oriented to person, place, and time.      No results found for any visits on 06/02/24.     The ASCVD Risk score (Arnett DK, et al., 2019) failed to calculate for the following reasons:   Risk score cannot be calculated because patient has a medical history suggesting prior/existing ASCVD    Assessment & Plan:   (1) T2DM - Assessment: Well controlled without medications. 5.9 A1C today, patient is  in pre-diabetic range however their blood glucose are well controlled with diet and physical activity. - Plan: Continue checking blood glucose levels regularly. Monitor diet and increase physical activity.  (2) HTN - Assessment: Well controlled on medications. - Plan: Continue taking Nebivolol  and Furosemide  as prescribed.   (3) Hyperlipidemia  - Assessment: Well controlled on medications. Last lipid profile indicated low HDL, otherwise unremarkable. - Plan: Continue taking Rosuvastatin  as prescribed.   (4) COPD - Assessment: Well controlled on medications with mild exacerbation due to weather.  - Plan: Continue taking Breztri  and Albuterol  as prescribed. Provided a Vortex spacer to help him better inhale his medications. Advised to monitor his symptoms and RTC if symptoms worsen or fail to improve.    No follow-ups on file.    Luis Salinas, Medical Student  Patient seen and examined with medical student, agree with assessment plan above.  Patient's breathing seems to be stable with where he is at.  I would not adjust any things at this point, just recommended that he use a spacer with his medication to make sure it delivers a little bit more into his lungs and that he be more consistent about using his medicines as he does not use them every day all the time. Luis Needs, MD [facility] 06/02/2024, 4:45 PM

## 2024-06-03 LAB — CBC WITH DIFFERENTIAL/PLATELET
Basophils Absolute: 0.1 10*3/uL (ref 0.0–0.2)
Basos: 1 %
EOS (ABSOLUTE): 0.6 10*3/uL — ABNORMAL HIGH (ref 0.0–0.4)
Eos: 6 %
Hematocrit: 43.5 % (ref 37.5–51.0)
Hemoglobin: 13.6 g/dL (ref 13.0–17.7)
Immature Grans (Abs): 0 10*3/uL (ref 0.0–0.1)
Immature Granulocytes: 0 %
Lymphocytes Absolute: 2.6 10*3/uL (ref 0.7–3.1)
Lymphs: 28 %
MCH: 29.3 pg (ref 26.6–33.0)
MCHC: 31.3 g/dL — ABNORMAL LOW (ref 31.5–35.7)
MCV: 94 fL (ref 79–97)
Monocytes Absolute: 0.6 10*3/uL (ref 0.1–0.9)
Monocytes: 6 %
Neutrophils Absolute: 5.4 10*3/uL (ref 1.4–7.0)
Neutrophils: 59 %
Platelets: 187 10*3/uL (ref 150–450)
RBC: 4.64 x10E6/uL (ref 4.14–5.80)
RDW: 12.9 % (ref 11.6–15.4)
WBC: 9.2 10*3/uL (ref 3.4–10.8)

## 2024-06-03 LAB — CMP14+EGFR
ALT: 24 IU/L (ref 0–44)
AST: 29 IU/L (ref 0–40)
Albumin: 4.3 g/dL (ref 3.8–4.8)
Alkaline Phosphatase: 113 IU/L (ref 44–121)
BUN/Creatinine Ratio: 16 (ref 10–24)
BUN: 17 mg/dL (ref 8–27)
Bilirubin Total: 0.3 mg/dL (ref 0.0–1.2)
CO2: 27 mmol/L (ref 20–29)
Calcium: 9.4 mg/dL (ref 8.6–10.2)
Chloride: 100 mmol/L (ref 96–106)
Creatinine, Ser: 1.08 mg/dL (ref 0.76–1.27)
Globulin, Total: 2.2 g/dL (ref 1.5–4.5)
Glucose: 196 mg/dL — ABNORMAL HIGH (ref 70–99)
Potassium: 4.5 mmol/L (ref 3.5–5.2)
Sodium: 142 mmol/L (ref 134–144)
Total Protein: 6.5 g/dL (ref 6.0–8.5)
eGFR: 70 mL/min/{1.73_m2} (ref >59)

## 2024-06-05 DIAGNOSIS — J449 Chronic obstructive pulmonary disease, unspecified: Secondary | ICD-10-CM | POA: Diagnosis not present

## 2024-06-05 NOTE — Progress Notes (Unsigned)
 POST OPERATIVE OFFICE NOTE    CC:  F/u for surgery  HPI:  This is a 78 y.o. male who is s/p left external iliac artery stenting, iliofemoral endarterectomy with vein patch angioplasty and femoral to below the knee popliteal bypass with vein on 04/26/2024 by Dr. Magda due to critical limb ischemia with interdigital ulceration.  Wound between his 4th and 5th toe has healed since surgery.  He also believes all incisions of the left leg have healed.  He no longer claudicates or has rest pain.  He is very happy with how his surgery went.  He is taking aspirin  and Plavix  daily.  He is on Eliquis  for atrial fibrillation.  No Known Allergies  Current Outpatient Medications  Medication Sig Dispense Refill   ACCU-CHEK GUIDE test strip USE TO CHECK SUGAR UP TO 4 TIMES DAILY AS DIRECTED     Accu-Chek Softclix Lancets lancets SMARTSIG:Topical 1-4 Times Daily     acetaminophen  (TYLENOL ) 650 MG CR tablet Take 650-1,300 mg by mouth every 8 (eight) hours as needed for pain.     albuterol  (VENTOLIN  HFA) 108 (90 Base) MCG/ACT inhaler Inhale 2 puffs into the lungs every 6 (six) hours as needed for wheezing or shortness of breath. 18 each 1   apixaban  (ELIQUIS ) 5 MG TABS tablet Take 1 tablet (5 mg total) by mouth 2 (two) times daily. 180 tablet 3   aspirin  EC ([employer] ASPIRIN  LOW DOSE) 81 MG tablet Take 1 tablet (81 mg total) by mouth daily. 90 tablet 3   b complex vitamins capsule Take 1 capsule by mouth daily.     blood glucose meter kit and supplies KIT Dispense based on patient and insurance preference. Use up to four times daily as directed. 1 each 0   Budeson-Glycopyrrol-Formoterol  (BREZTRI  AEROSPHERE) 160-9-4.8 MCG/ACT AERO Inhale 2 puffs into the lungs 2 (two) times daily. (Patient taking differently: Inhale 2 puffs into the lungs 2 (two) times daily as needed.) 10.7 g 11   clopidogrel  (PLAVIX ) 75 MG tablet Take 1 tablet (75 mg total) by mouth daily at 6 (six) AM. 30 tablet 2   diphenhydramine-acetaminophen   (TYLENOL  PM) 25-500 MG TABS tablet Take 1-2 tablets by mouth at bedtime as needed (sleep/pain.).     furosemide  (LASIX ) 40 MG tablet Take 1 tablet (40 mg total) by mouth daily. 90 tablet 3   Multiple Vitamins-Minerals (PRESERVISION AREDS 2) CAPS Take 1 capsule by mouth daily.     nebivolol  (BYSTOLIC ) 10 MG tablet Take 1 tablet (10 mg total) by mouth daily. 90 tablet 3   nebivolol  (BYSTOLIC ) 5 MG tablet TAKE 1 TABLET (5 MG TOTAL) BY MOUTH DAILY. WITH 10 MG TABLET TO EQUAL 15 MG DAILY 90 tablet 3   nitroGLYCERIN  (NITROSTAT ) 0.4 MG SL tablet Place 1 tablet (0.4 mg total) under the tongue every 5 (five) minutes as needed for chest pain. 50 tablet 3   OVER THE COUNTER MEDICATION Apply 1 application  topically daily. neilmed wound wash on foot     OVER THE COUNTER MEDICATION Apply 1 application  topically 4 (four) times daily as needed (pain). frankincense & myrrh on foot     oxyCODONE  (OXY IR/ROXICODONE ) 5 MG immediate release tablet Take 1 tablet (5 mg total) by mouth every 6 (six) hours as needed for moderate pain (pain score 4-6). 20 tablet 0   OXYGEN  Inhale 4 L into the lungs daily.     potassium chloride  SA (KLOR-CON  M20) 20 MEQ tablet Take 1 tablet (20 mEq total) by  mouth daily. 90 tablet 3   rosuvastatin  (CRESTOR ) 40 MG tablet Take 1 tablet (40 mg total) by mouth daily. (Patient taking differently: Take 40 mg by mouth at bedtime.) 90 tablet 3   No current facility-administered medications for this visit.     ROS:  See HPI  Physical Exam:  Vitals:   06/06/24 1405  BP: 119/67  Pulse: 73  Temp: 98.2 F (36.8 C)  TempSrc: Temporal  SpO2: 90%  Weight: 125 lb 9.6 oz (57 kg)  Height: 5' 7 (1.702 m)    Incision: All incisions of the left leg well-healed Extremities: Palpable DP pulse; wound between the 4th and 5th toe healed Neuro: A&O  Left iliac stent widely patent Left leg bypass widely patent  ABI/TBIToday's ABIToday's TBIPrevious ABIPrevious TBI   +-------+-----------+-----------+------------+------------+  Right 0.42       0.0        0.48        0.76          +-------+-----------+-----------+------------+------------+  Left  0.90       0.74       0.32        0.21          +-------+-----------+-----------+------------+------------+   Assessment/Plan:  This is a 78 y.o. male who is s/p: External iliac artery stenting, iliofemoral endarterectomy with vein patch angioplasty, and femoral to below the knee popliteal bypass with vein due to critical limb ischemia with ulceration  Left leg well-perfused with a palpable DP pulse.  Wound between the 4th and 5th toe has healed since surgery.  All incisions of the left leg are completely healed.  Duplex demonstrates a widely patent iliac stent as well as a widely patent bypass.  He will finish his current prescription of Plavix  then will discontinue Plavix  and take aspirin  and Eliquis  on a daily basis.  Encouraged him to walk for exercise.  We will repeat bypass duplex and ABI in 6 months.   Donnice Sender, PA-C [facility] [phone]  Clinic MD:  Gretta

## 2024-06-06 ENCOUNTER — Ambulatory Visit: Attending: Vascular Surgery | Admitting: Physician Assistant

## 2024-06-06 VITALS — BP 119/67 | HR 73 | Temp 98.2°F | Ht 67.0 in | Wt 125.6 lb

## 2024-06-06 DIAGNOSIS — I70245 Atherosclerosis of native arteries of left leg with ulceration of other part of foot: Secondary | ICD-10-CM

## 2024-06-06 DIAGNOSIS — I739 Peripheral vascular disease, unspecified: Secondary | ICD-10-CM

## 2024-06-07 ENCOUNTER — Other Ambulatory Visit: Payer: Self-pay

## 2024-06-07 DIAGNOSIS — I70245 Atherosclerosis of native arteries of left leg with ulceration of other part of foot: Secondary | ICD-10-CM

## 2024-06-08 ENCOUNTER — Ambulatory Visit (INDEPENDENT_AMBULATORY_CARE_PROVIDER_SITE_OTHER): Payer: Medicare HMO

## 2024-06-08 VITALS — BP 119/67 | HR 73 | Ht 67.0 in | Wt 125.0 lb

## 2024-06-08 DIAGNOSIS — Z0000 Encounter for general adult medical examination without abnormal findings: Secondary | ICD-10-CM

## 2024-06-08 NOTE — Progress Notes (Signed)
 Subjective:   Luis Salinas is a 78 y.o. who presents for a Medicare Wellness preventive visit.  As a reminder, Annual Wellness Visits don't include a physical exam, and some assessments may be limited, especially if this visit is performed virtually. We may recommend an in-person follow-up visit with your provider if needed.  Visit Complete: Virtual I connected with  Luis Salinas on 06/08/24 by a audio enabled telemedicine application and verified that I am speaking with the correct person using two identifiers.  Patient Location: Home  Provider Location: Home Office  I discussed the limitations of evaluation and management by telemedicine. The patient expressed understanding and agreed to proceed.  Vital Signs: Because this visit was a virtual/telehealth visit, some criteria may be missing or patient reported. Any vitals not documented were not able to be obtained and vitals that have been documented are patient reported.  VideoDeclined- This patient declined Librarian, academic. Therefore the visit was completed with audio only.  Persons Participating in Visit: Patient.  AWV Questionnaire: No: Patient Medicare AWV questionnaire was not completed prior to this visit.  Cardiac Risk Factors include: advanced age (>53men, >63 women);diabetes mellitus;dyslipidemia;hypertension;smoking/ tobacco exposure;male gender;Other (see comment), Risk factor comments: CAD     Objective:    Today's Vitals   06/08/24 1345  BP: 119/67  Pulse: 73  Weight: 125 lb (56.7 kg)  Height: 5' 7 (1.702 m)   Body mass index is 19.58 kg/m.     06/08/2024    1:59 PM 04/27/2024    8:00 PM 04/21/2024    6:54 AM 06/08/2023    8:51 AM 06/05/2022    9:10 AM 11/10/2021    1:35 PM 09/20/2021    4:00 AM  Advanced Directives  Does Patient Have a Medical Advance Directive? No No No Yes Yes No No  Type of Aeronautical engineer of [place];Living will Healthcare Power  of [place];Living will    Copy of Healthcare Power of Attorney in Chart?    No - copy requested No - copy requested    Would patient like information on creating a medical advance directive?  No - Patient declined No - Patient declined    No - Patient declined    Current Medications (verified) Outpatient Encounter Medications as of 06/08/2024  Medication Sig   ACCU-CHEK GUIDE test strip USE TO CHECK SUGAR UP TO 4 TIMES DAILY AS DIRECTED   Accu-Chek Softclix Lancets lancets SMARTSIG:Topical 1-4 Times Daily   acetaminophen  (TYLENOL ) 650 MG CR tablet Take 650-1,300 mg by mouth every 8 (eight) hours as needed for pain.   albuterol  (VENTOLIN  HFA) 108 (90 Base) MCG/ACT inhaler Inhale 2 puffs into the lungs every 6 (six) hours as needed for wheezing or shortness of breath.   apixaban  (ELIQUIS ) 5 MG TABS tablet Take 1 tablet (5 mg total) by mouth 2 (two) times daily.   aspirin  EC ([employer] ASPIRIN  LOW DOSE) 81 MG tablet Take 1 tablet (81 mg total) by mouth daily.   b complex vitamins capsule Take 1 capsule by mouth daily.   blood glucose meter kit and supplies KIT Dispense based on patient and insurance preference. Use up to four times daily as directed.   Budeson-Glycopyrrol-Formoterol  (BREZTRI  AEROSPHERE) 160-9-4.8 MCG/ACT AERO Inhale 2 puffs into the lungs 2 (two) times daily. (Patient taking differently: Inhale 2 puffs into the lungs 2 (two) times daily as needed.)   clopidogrel  (PLAVIX ) 75 MG tablet Take 1 tablet (75 mg total) by  mouth daily at 6 (six) AM.   diphenhydramine-acetaminophen  (TYLENOL  PM) 25-500 MG TABS tablet Take 1-2 tablets by mouth at bedtime as needed (sleep/pain.).   furosemide  (LASIX ) 40 MG tablet Take 1 tablet (40 mg total) by mouth daily.   nebivolol  (BYSTOLIC ) 10 MG tablet Take 1 tablet (10 mg total) by mouth daily.   nebivolol  (BYSTOLIC ) 5 MG tablet TAKE 1 TABLET (5 MG TOTAL) BY MOUTH DAILY. WITH 10 MG TABLET TO EQUAL 15 MG DAILY   nitroGLYCERIN  (NITROSTAT ) 0.4 MG SL tablet Place  1 tablet (0.4 mg total) under the tongue every 5 (five) minutes as needed for chest pain.   OXYGEN  Inhale 4 L into the lungs daily.   potassium chloride  SA (KLOR-CON  M20) 20 MEQ tablet Take 1 tablet (20 mEq total) by mouth daily.   rosuvastatin  (CRESTOR ) 40 MG tablet Take 1 tablet (40 mg total) by mouth daily. (Patient taking differently: Take 40 mg by mouth at bedtime.)   Multiple Vitamins-Minerals (PRESERVISION AREDS 2) CAPS Take 1 capsule by mouth daily. (Patient not taking: Reported on 06/08/2024)   OVER THE COUNTER MEDICATION Apply 1 application  topically daily. neilmed wound wash on foot (Patient not taking: Reported on 06/08/2024)   OVER THE COUNTER MEDICATION Apply 1 application  topically 4 (four) times daily as needed (pain). frankincense & myrrh on foot (Patient not taking: Reported on 06/08/2024)   oxyCODONE  (OXY IR/ROXICODONE ) 5 MG immediate release tablet Take 1 tablet (5 mg total) by mouth every 6 (six) hours as needed for moderate pain (pain score 4-6). (Patient not taking: Reported on 06/08/2024)   No facility-administered encounter medications on file as of 06/08/2024.    Allergies (verified) Patient has no known allergies.   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 His family found him after 27 years   Arthritis    Cataract    Chronic diastolic CHF (congestive heart failure) (HCC)    COPD (chronic obstructive pulmonary disease) (HCC)    Dyslipidemia    History of concussion    History of STEMI    a. 11/2007 s/p DES to [facility]X at [facility]   Hypertension    On home oxygen  therapy    4L/[place]   Paroxysmal atrial fibrillation (HCC)    Pneumonia    exposure to caustic chemicals   Stroke [facility])    multiple strokes   Tobacco abuse    Type 2 diabetes mellitus (HCC)    Past Surgical History:  Procedure Laterality Date   ABDOMINAL AORTOGRAM N/A 04/21/2024   Procedure: ABDOMINAL AORTOGRAM;  Surgeon: Magda Debby SAILOR,  MD;  Location: [facility] INVASIVE CV LAB;  Service: Cardiovascular;  Laterality: N/A;   ANTERIOR CERVICAL DECOMP/DISCECTOMY FUSION  05/16/2012   Procedure: ANTERIOR CERVICAL DECOMPRESSION/DISCECTOMY FUSION 2 LEVELS;  Surgeon: Victory Gens, MD;  Location: [facility] NEURO ORS;  Service: Neurosurgery;  Laterality: N/A;  Cervical Five-Six, Cervical Six-Seven Anterior Cervical Decompression/Diskectomy Fusion   CARDIAC CATHETERIZATION     CARDIOVERSION N/A 11/10/2021   Procedure: CARDIOVERSION;  Surgeon: Delford Maude BROCKS, MD;  Location: [facility] ENDOSCOPY;  Service: Cardiovascular;  Laterality: N/A;   COLONOSCOPY W/ POLYPECTOMY     CORONARY ANGIOPLASTY     ENDARTERECTOMY FEMORAL Left 04/26/2024   Procedure: ENDARTERECTOMY, FEMORAL WITH ANGIOPLASTY;  Surgeon: Magda Debby SAILOR, MD;  Location: [facility] OR;  Service: Vascular;  Laterality: Left;   EYE SURGERY     cataracts   FEMORAL-POPLITEAL BYPASS GRAFT  Left 04/26/2024   Procedure: BYPASS GRAFT FEMORAL-POPLITEAL ARTERY WITH LEFT GREATER SAPHENOUS VEIN HARVEST;  Surgeon: Magda Debby SAILOR, MD;  Location: [facility] OR;  Service: Vascular;  Laterality: Left;   INSERTION OF ILIAC STENT Left 04/26/2024   Procedure: INSERTION, EXTERNAL ILIAC STENT, ARTERY;  Surgeon: Magda Debby SAILOR, MD;  Location: [facility] OR;  Service: Vascular;  Laterality: Left;   JOINT REPLACEMENT     Left knee replacement   JOINT REPLACEMENT     left elbow at age 64   LOWER EXTREMITY ANGIOGRAM Left 04/26/2024   Procedure: GERALYN, LOWER EXTREMITY;  Surgeon: Magda Debby SAILOR, MD;  Location: [facility] OR;  Service: Vascular;  Laterality: Left;   LOWER EXTREMITY ANGIOGRAPHY N/A 04/21/2024   Procedure: Lower Extremity Angiography;  Surgeon: Magda Debby SAILOR, MD;  Location: [facility] INVASIVE CV LAB;  Service: Cardiovascular;  Laterality: N/A;   LOWER EXTREMITY INTERVENTION N/A 04/21/2024   Procedure: LOWER EXTREMITY INTERVENTION;  Surgeon: Magda Debby SAILOR, MD;  Location: [facility] INVASIVE CV LAB;  Service: Cardiovascular;  Laterality: N/A;   ROTATOR  CUFF REPAIR     left   TONSILLECTOMY     Family History  Problem Relation Age of Onset   Heart disease Mother    COPD Father    Aneurysm Father    Anesthesia problems Neg Hx    Hypotension Neg Hx    Malignant hyperthermia Neg Hx    Pseudochol deficiency Neg Hx    Social History   Socioeconomic History   Marital status: Married    Spouse name: Grayce   Number of children: 7   Years of education: college   Highest education level: Some college, no degree  Occupational History   Occupation: Retired    Comment: Proofreader  Tobacco Use   Smoking status: Light Smoker    Types: Cigars    Last attempt to quit: 04/03/2013    Years since quitting: 11.1   Smokeless tobacco: Never   Tobacco comments:    1-2 cigars per day.  Smoked 40 years cigarettes.  10 years ago quit cigarettes.  Vaping Use   Vaping status: Never Used  Substance and Sexual Activity   Alcohol use: No    Alcohol/week: 0.0 standard drinks of alcohol   Drug use: Yes    Types: Marijuana    Comment: offer he will smoke 01/20/22   Sexual activity: Yes  Other Topics Concern   Not on file  Social History Narrative   Lives with his fourth wife.    Says his family found him in 1992 and he had no recollection of his childhood -believes to have had several head injuries    Social Drivers of Health   Financial Resource Strain: Low Risk  (06/08/2024)   Overall Financial Resource Strain (CARDIA)    Difficulty of Paying Living Expenses: Not hard at all  Food Insecurity: No Food Insecurity (06/08/2024)   Hunger Vital Sign    Worried About Running Out of Food in the Last Year: Never true    Ran Out of Food in the Last Year: Never true  Recent Concern: Food Insecurity - Food Insecurity Present (05/02/2024)   Hunger Vital Sign    Worried About Running Out of Food in the Last Year: Sometimes true    Ran Out of Food in the Last Year: Sometimes true  Transportation Needs: No Transportation Needs (06/08/2024)   PRAPARE -  Administrator, Civil Service (Medical): No    Lack of Transportation (Non-Medical): No  Physical Activity: Insufficiently Active (06/08/2024)   Exercise Vital Sign    Days of Exercise per Week: 2 days    Minutes of Exercise per Session: 20 min  Stress: No Stress Concern Present (06/08/2024)   [employer] of Occupational Health - Occupational Stress Questionnaire    Feeling of Stress: Not at all  Social Connections: Socially Integrated (04/28/2024)   Social Connection and Isolation Panel    Frequency of Communication with Friends and Family: Never    Frequency of Social Gatherings with Friends and Family: More than three times a week    Attends Religious Services: 1 to 4 times per year    Active Member of [employer] or Organizations: Yes    Attends Banker Meetings: Never    Marital Status: Married    Tobacco Counseling Ready to quit: Not Answered Counseling given: Yes Tobacco comments: 1-2 cigars per day.  Smoked 40 years cigarettes.  10 years ago quit cigarettes.    Clinical Intake:  Pre-visit preparation completed: Yes  Pain : No/denies pain     BMI - recorded: 19.58 Nutritional Status: BMI of 19-24  Normal Nutritional Risks: None Diabetes: Yes CBG done?: No  Lab Results  Component Value Date   HGBA1C 5.9 (H) 06/02/2024   HGBA1C 5.9 (H) 03/02/2024   HGBA1C 6.0 (H) 12/03/2023     How often do you need to have someone help you when you read instructions, pamphlets, or other written materials from your doctor or pharmacy?: 1 - Never  Interpreter Needed?: No  Information entered by :: Alia t/cma   Activities of Daily Living     06/08/2024    1:56 PM 04/26/2024   10:16 AM  In your present state of health, do you have any difficulty performing the following activities:  Hearing? 0   Vision? 0   Difficulty concentrating or making decisions? 1   Comment short term memory   Walking or climbing stairs? 0   Dressing or bathing? 0   Doing  errands, shopping? 0 0  Preparing Food and eating ? N   Using the Toilet? N   In the past six months, have you accidently leaked urine? Y   Do you have problems with loss of bowel control? Y   Managing your Medications? N   Managing your Finances? N   Housekeeping or managing your Housekeeping? N     Patient Care Team: Dettinger, Fonda LABOR, MD as PCP - General (Family Medicine) Kate Lonni CROME, MD as PCP - Cardiology (Cardiology) Rosemarie Eather RAMAN, MD as Consulting Physician (Neurology) Avram Lupita BRAVO, MD as Consulting Physician (Gastroenterology) Dr Willma [facility], OD (Optometry)  I have updated your Care Teams any recent Medical Services you may have received from other providers in the past year.     Assessment:   This is a routine wellness examination for Luis.  Hearing/Vision screen Hearing Screening - Comments:: Pt denies hearing dif Vision Screening - Comments:: Pt denies vision dif/pt goes to [facility], [place]/last ov was last yr   Goals Addressed             This Visit's Progress    Patient Stated       Pt would like win the lottery to leave wife with something       Depression Screen     06/08/2024    2:01 PM 06/02/2024    1:24 PM 03/02/2024    9:52 AM 12/03/2023    8:36 AM  09/02/2023   11:05 AM 06/08/2023    8:50 AM 06/02/2023   11:21 AM  PHQ 2/9 Scores  PHQ - 2 Score 0 0 0 0 0 0 0  PHQ- 9 Score 0   0 0 0 3    Fall Risk     06/08/2024    1:46 PM 06/02/2024    1:24 PM 03/02/2024    9:52 AM 12/03/2023    8:36 AM 09/02/2023   11:06 AM  Fall Risk   Falls in the past year? 0 0 0 0 0  Number falls in past yr: 0 0   0  Injury with Fall? 0 0   0  Risk for fall due to : No Fall Risks No Fall Risks   No Fall Risks  Follow up Falls evaluation completed Falls evaluation completed   Falls evaluation completed    MEDICARE RISK AT HOME:  Medicare Risk at Home Any stairs in or around the home?: No If so, are there  any without handrails?: No Home free of loose throw rugs in walkways, pet beds, electrical cords, etc?: Yes Adequate lighting in your home to reduce risk of falls?: Yes Life alert?: No Use of a cane, walker or w/c?: No Grab bars in the bathroom?: No Shower chair or bench in shower?: Yes Elevated toilet seat or a handicapped toilet?: Yes  TIMED UP AND GO:  Was the test performed?  no  Cognitive Function: 6CIT completed    03/30/2018    9:08 AM 10/20/2016    8:47 AM 05/17/2015    2:19 PM  MMSE - Mini Mental State Exam  Orientation to time 4 5  5    Orientation to Place 5 5  5    Registration 3 3  3    Attention/ Calculation 5 3  5    Recall 2 3  3    Language- name 2 objects 2 2  2    Language- repeat 1 1 1   Language- follow 3 step command 3 3  3    Language- read & follow direction 1 1  1    Write a sentence 1 1  1    Copy design 1 1  1    Total score 28 28  30       Data saved with a previous flowsheet row definition        06/08/2024    2:02 PM 06/08/2023    8:52 AM 06/05/2022    9:11 AM 06/04/2021    9:16 AM 06/26/2019    3:08 PM  6CIT Screen  What Year? 0 points 0 points 0 points 0 points 0 points  What month? 3 points 0 points 0 points 0 points 0 points  What time? 0 points 0 points 0 points 0 points 0 points  Count back from 20 0 points 0 points 0 points 0 points 0 points  Months in reverse 4 points 0 points 4 points 2 points 2 points  Repeat phrase 0 points 0 points 6 points 4 points 4 points  Total Score 7 points 0 points 10 points 6 points 6 points    Immunizations Immunization History  Administered Date(s) Administered   Pneumococcal Conjugate-13 03/29/2015   Pneumococcal Polysaccharide-23 10/14/2002, 12/27/2013   Td 12/14/2008, 12/27/2013   Tdap 06/02/2024    Screening Tests Health Maintenance  Topic Date Due   Lung Cancer Screening  03/19/2007   COVID-19 Vaccine (1) 06/24/2025 (Originally 11/12/1951)   Zoster Vaccines- Shingrix (1 of 2) 09/02/2025 (Originally  11/12/1965)   OPHTHALMOLOGY EXAM  07/11/2024   INFLUENZA VACCINE  07/14/2024   Diabetic kidney evaluation - Urine ACR  12/02/2024   HEMOGLOBIN A1C  12/02/2024   Diabetic kidney evaluation - eGFR measurement  06/02/2025   FOOT EXAM  06/02/2025   Medicare Annual Wellness (AWV)  06/08/2025   DTaP/Tdap/Td (4 - Td or Tdap) 06/02/2034   Pneumococcal Vaccine: 50+ Years  Completed   Hepatitis C Screening  Completed   Hepatitis B Vaccines  Aged Out   HPV VACCINES  Aged Out   Meningococcal B Vaccine  Aged Out   Colonoscopy  Discontinued    Health Maintenance  Health Maintenance Due  Topic Date Due   Lung Cancer Screening  03/19/2007   Health Maintenance Items Addressed: See Nurse Notes at the end of this note  Additional Screening:  Vision Screening: Recommended annual ophthalmology exams for early detection of glaucoma and other disorders of the eye. Would you like a referral to an eye doctor? No    Dental Screening: Recommended annual dental exams for proper oral hygiene  Community Resource Referral / Chronic Care Management: CRR required this visit?  No   CCM required this visit?  No   Plan:    I have personally reviewed and noted the following in the patient's chart:   Medical and social history Use of alcohol, tobacco or illicit drugs  Current medications and supplements including opioid prescriptions. Patient is not currently taking opioid prescriptions. Functional ability and status Nutritional status Physical activity Advanced directives List of other physicians Hospitalizations, surgeries, and ER visits in previous 12 months Vitals Screenings to include cognitive, depression, and falls Referrals and appointments  In addition, I have reviewed and discussed with patient certain preventive protocols, quality metrics, and best practice recommendations. A written personalized care plan for preventive services as well as general preventive health recommendations were  provided to patient.   Ozie Ned, CMA   06/08/2024   After Visit Summary: (MyChart) Due to this being a telephonic visit, the after visit summary with patients personalized plan was offered to patient via MyChart   Notes: Per pt decline Lung Cancer screening.

## 2024-06-08 NOTE — Patient Instructions (Signed)
 Luis Salinas , Thank you for taking time out of your busy schedule to complete your Annual Wellness Visit with me. I enjoyed our conversation and look forward to speaking with you again next year. I, as well as your care team,  appreciate your ongoing commitment to your health goals. Please review the following plan we discussed and let me know if I can assist you in the future. Your Game plan/ To Do List    Follow up Visits: Next Medicare AWV with our clinical staff: 06/11/25 at 3:50a.m.   Have you seen your provider in the last 6 months (3 months if uncontrolled diabetes)? Yes Next Office Visit with your provider: 09/04/24 at 1:10p.m.  Clinician Recommendations:  Aim for 30 minutes of exercise or brisk walking, 6-8 glasses of water, and 5 servings of fruits and vegetables each day.       This is a list of the screening recommended for you and due dates:  Health Maintenance  Topic Date Due   Screening for Lung Cancer  03/19/2007   COVID-19 Vaccine (1) 06/24/2025*   Zoster (Shingles) Vaccine (1 of 2) 09/02/2025*   Eye exam for diabetics  07/11/2024   Flu Shot  07/14/2024   Yearly kidney health urinalysis for diabetes  12/02/2024   Hemoglobin A1C  12/02/2024   Yearly kidney function blood test for diabetes  06/02/2025   Complete foot exam   06/02/2025   Medicare Annual Wellness Visit  06/08/2025   DTaP/Tdap/Td vaccine (4 - Td or Tdap) 06/02/2034   Pneumococcal Vaccine for age over 67  Completed   Hepatitis C Screening  Completed   Hepatitis B Vaccine  Aged Out   HPV Vaccine  Aged Out   Meningitis B Vaccine  Aged Out   Colon Cancer Screening  Discontinued  *Topic was postponed. The date shown is not the original due date.    Advanced directives: (Declined) Advance directive discussed with you today. Even though you declined this today, please call our office should you change your mind, and we can give you the proper paperwork for you to fill out. Advance Care Planning is important  because it:  [x]  Makes sure you receive the medical care that is consistent with your values, goals, and preferences  [x]  It provides guidance to your family and loved ones and reduces their decisional burden about whether or not they are making the right decisions based on your wishes.  Follow the link provided in your after visit summary or read over the paperwork we have mailed to you to help you started getting your Advance Directives in place. If you need assistance in completing these, please reach out to us  so that we can help you!  See attachments for Preventive Care and Fall Prevention Tips.

## 2024-06-13 ENCOUNTER — Telehealth: Payer: Self-pay

## 2024-06-13 NOTE — Telephone Encounter (Signed)
 Pt's wife, Dorn Hartshorne, called asking if pt could stop taking his Plavix  because he has bruising all over his body. Grayce confirmed patient has taken 30 days + 16 days of plavix .  Per Dr. Magda, pt can stop his Plavix  and remain on aspirin .

## 2024-06-15 ENCOUNTER — Ambulatory Visit: Payer: Self-pay | Admitting: Family Medicine

## 2024-07-05 DIAGNOSIS — J449 Chronic obstructive pulmonary disease, unspecified: Secondary | ICD-10-CM | POA: Diagnosis not present

## 2024-07-16 ENCOUNTER — Other Ambulatory Visit: Payer: Self-pay | Admitting: Cardiology

## 2024-08-05 DIAGNOSIS — J449 Chronic obstructive pulmonary disease, unspecified: Secondary | ICD-10-CM | POA: Diagnosis not present

## 2024-08-08 ENCOUNTER — Other Ambulatory Visit: Payer: Self-pay | Admitting: Cardiology

## 2024-08-13 ENCOUNTER — Other Ambulatory Visit: Payer: Self-pay | Admitting: Cardiology

## 2024-08-31 ENCOUNTER — Other Ambulatory Visit: Payer: Self-pay | Admitting: Cardiology

## 2024-09-03 ENCOUNTER — Other Ambulatory Visit: Payer: Self-pay | Admitting: Family Medicine

## 2024-09-04 ENCOUNTER — Encounter: Payer: Self-pay | Admitting: Family Medicine

## 2024-09-04 ENCOUNTER — Ambulatory Visit: Admitting: Family Medicine

## 2024-09-04 VITALS — BP 102/59 | HR 60 | Ht 67.0 in | Wt 122.0 lb

## 2024-09-04 DIAGNOSIS — E1169 Type 2 diabetes mellitus with other specified complication: Secondary | ICD-10-CM | POA: Diagnosis not present

## 2024-09-04 DIAGNOSIS — J9611 Chronic respiratory failure with hypoxia: Secondary | ICD-10-CM | POA: Diagnosis not present

## 2024-09-04 DIAGNOSIS — E785 Hyperlipidemia, unspecified: Secondary | ICD-10-CM

## 2024-09-04 DIAGNOSIS — I5032 Chronic diastolic (congestive) heart failure: Secondary | ICD-10-CM | POA: Diagnosis not present

## 2024-09-04 DIAGNOSIS — I10 Essential (primary) hypertension: Secondary | ICD-10-CM

## 2024-09-04 DIAGNOSIS — J449 Chronic obstructive pulmonary disease, unspecified: Secondary | ICD-10-CM

## 2024-09-04 LAB — BAYER DCA HB A1C WAIVED: HB A1C (BAYER DCA - WAIVED): 6 % — ABNORMAL HIGH (ref 4.8–5.6)

## 2024-09-04 NOTE — Progress Notes (Signed)
 BP (!) 102/59   Pulse 60   Ht 5' 7 (1.702 m)   Wt 122 lb (55.3 kg)   SpO2 90%   BMI 19.11 kg/m    Subjective:   [name], male    DOB: 12/18/1945, 78 y.o.   MRN: [id number]  HPI: Luis Salinas is a 78 y.o. male presenting on 09/04/2024 for Medical Management of Chronic Issues, Diabetes, and Hypertension   Discussed the use of AI scribe software for clinical note transcription with the patient, who gave verbal consent to proceed.  History of Present Illness   Luis Salinas is a 78 year old male with COPD, atrial fibrillation, and congestive heart failure who presents for a recheck of his conditions and management of his oxygen  therapy.  He is currently using a portable oxygen  concentrator but feels it is not providing adequate oxygen , which limits his mobility and requires him to stay home more often. He wants a more effective oxygen  delivery system to be more active.  He has a history of COPD with worsening breathing difficulties. He uses Breztri  and albuterol  inhalers regularly, although he occasionally misses doses. He has not seen a pulmonologist and is hesitant about undergoing cancer screening, preferring to remain unaware of any potential diagnosis. His breathing issues have become more pronounced, impacting his ability to exercise and perform daily activities.  Regarding his cardiac history, he has atrial fibrillation and congestive heart failure but has not experienced recent exacerbations. He has not seen a cardiologist recently.  Socially, he sleeps on the couch due to breathing difficulties and has considered exercising but feels limited by his current respiratory status. He wants to maintain his independence, including driving his car, but is constrained by his current oxygen  therapy setup.          Relevant past medical, surgical, family and social history reviewed and updated as indicated. Interim medical history since our last visit  reviewed. Allergies and medications reviewed and updated.  Review of Systems  Constitutional:  Negative for chills and fever.  HENT:  Positive for congestion.   Eyes:  Negative for visual disturbance.  Respiratory:  Positive for cough, shortness of breath and wheezing.   Cardiovascular:  Negative for chest pain and leg swelling.  Musculoskeletal:  Negative for back pain and gait problem.  Skin:  Negative for rash.  All other systems reviewed and are negative.   Per HPI unless specifically indicated above   Allergies as of 09/04/2024   No Known Allergies      Medication List        Accurate as of September 04, 2024  1:43 PM. If you have any questions, ask your nurse or doctor.          STOP taking these medications    OVER THE COUNTER MEDICATION Stopped by: Fonda LABOR Jeraldine Primeau   OVER THE COUNTER MEDICATION Stopped by: Fonda LABOR Kyrstyn Greear   oxyCODONE  5 MG immediate release tablet Commonly known as: Oxy IR/ROXICODONE  Stopped by: Fonda LABOR Allona Gondek   PreserVision AREDS 2 Caps Stopped by: Fonda LABOR Wilba Mutz       TAKE these medications    Accu-Chek Guide test strip Generic drug: glucose blood USE TO CHECK SUGAR UP TO 4 TIMES DAILY AS DIRECTED   Accu-Chek Softclix Lancets lancets SMARTSIG:Topical 1-4 Times Daily   acetaminophen  650 MG CR tablet Commonly known as: TYLENOL  Take 650-1,300 mg by mouth every 8 (eight) hours as needed for pain.   albuterol   108 (90 Base) MCG/ACT inhaler Commonly known as: VENTOLIN  HFA Inhale 2 puffs into the lungs every 6 (six) hours as needed for wheezing or shortness of breath.   apixaban  5 MG Tabs tablet Commonly known as: Eliquis  Take 1 tablet (5 mg total) by mouth 2 (two) times daily.   aspirin  EC 81 MG tablet Commonly known as: [facility] Aspirin  Low Dose Take 1 tablet (81 mg total) by mouth daily.   b complex vitamins capsule Take 1 capsule by mouth daily.   blood glucose meter kit and supplies Kit Dispense based on  patient and insurance preference. Use up to four times daily as directed.   Breztri  Aerosphere 160-9-4.8 MCG/ACT Aero inhaler Generic drug: budesonide -glycopyrrolate-formoterol  Inhale 2 puffs into the lungs 2 (two) times daily. What changed:  when to take this reasons to take this   clopidogrel  75 MG tablet Commonly known as: PLAVIX  Take 1 tablet (75 mg total) by mouth daily at 6 (six) AM.   diphenhydramine-acetaminophen  25-500 MG Tabs tablet Commonly known as: TYLENOL  PM Take 1-2 tablets by mouth at bedtime as needed (sleep/pain.).   furosemide  40 MG tablet Commonly known as: LASIX  Take 1 tablet (40 mg total) by mouth daily.   nebivolol  10 MG tablet Commonly known as: BYSTOLIC  TAKE 1 TABLET BY MOUTH EVERY DAY   nebivolol  5 MG tablet Commonly known as: BYSTOLIC  TAKE 1 TABLET (5 MG TOTAL) BY MOUTH DAILY. WITH 10 MG TABLET TO EQUAL 15 MG DAILY   nitroGLYCERIN  0.4 MG SL tablet Commonly known as: NITROSTAT  Place 1 tablet (0.4 mg total) under the tongue every 5 (five) minutes as needed for chest pain.   OXYGEN  Inhale 4 L into the lungs daily.   potassium chloride  SA 20 MEQ tablet Commonly known as: Klor-Con  M20 Take 1 tablet (20 mEq total) by mouth daily.   rosuvastatin  40 MG tablet Commonly known as: CRESTOR  Take 1 tablet (40 mg total) by mouth daily. What changed: when to take this               Durable Medical Equipment  (From admission, onward)           Start     Ordered   09/04/24 0000  For home use only DME oxygen        Comments: [employer]ogen portable oxygen  concentrator Diagnosis COPD and chronic respiratory failure Physic his current device does not work  Question Answer Comment  Length of Need Lifetime   Mode or (Route) Nasal cannula   Liters per Minute 4   Frequency Continuous (stationary and portable oxygen  unit needed)   Oxygen  conserving device Yes   Oxygen  delivery system Gas      09/04/24 1337             Objective:   BP (!)  102/59   Pulse 60   Ht 5' 7 (1.702 m)   Wt 122 lb (55.3 kg)   SpO2 90%   BMI 19.11 kg/m   Wt Readings from Last 3 Encounters:  09/04/24 122 lb (55.3 kg)  06/08/24 125 lb (56.7 kg)  06/06/24 125 lb 9.6 oz (57 kg)    Physical Exam Vitals and nursing note reviewed.  Constitutional:      Appearance: Normal appearance.  Cardiovascular:     Rate and Rhythm: Regular rhythm. Tachycardia present.     Heart sounds: No murmur heard.    No friction rub.  Pulmonary:     Effort: Pulmonary effort is normal. No respiratory distress.  Breath sounds: No stridor. Wheezing present. No rhonchi.  Neurological:     Mental Status: He is alert.    Physical Exam   VITALS: BP- 102/59 CARDIOVASCULAR: No leg swelling, normal pulses.         Assessment & Plan:   Problem List Items Addressed This Visit       Cardiovascular and Mediastinum   Hypertension   Chronic diastolic CHF (congestive heart failure) (HCC)   Relevant Orders   For home use only DME oxygen      Respiratory   COPD (chronic obstructive pulmonary disease) (HCC)   Relevant Orders   For home use only DME oxygen    Ambulatory referral to Pulmonology     Endocrine   Type 2 diabetes mellitus (HCC) - Primary   Relevant Orders   Bayer DCA Hb A1c Waived     Other   Dyslipidemia   Other Visit Diagnoses       Chronic respiratory failure with hypoxia (HCC)       Relevant Orders   For home use only DME oxygen    Ambulatory referral to Pulmonology           Chronic obstructive pulmonary disease (COPD) COPD with worsening symptoms, increased dyspnea, and reduced activity tolerance. Current treatment includes Breztri  and albuterol  inhalers. He is reluctant to see a pulmonologist but open to improving breathing. Current oxygen  concentrator is inadequate. - Refer to pulmonologist for additional COPD treatment options. -Oxygen  showed 88% on room air and increased to 97% with 4 L nasal cannula  Chronic diastolic (congestive)  heart failure Chronic diastolic heart failure is well-managed with no recent exacerbations. No peripheral edema and good peripheral pulses. - Encourage annual cardiology evaluation.  Essential (primary) hypertension Hypertension is well-controlled. Blood pressure is on the lower end but acceptable.  Type 2 diabetes mellitus associated with hypertension and CHF and dyslipidemia Type 2 diabetes is well-controlled. Blood glucose levels are stable and within target range.          Follow up plan: Return in about 3 months (around 12/04/2024), or if symptoms worsen or fail to improve, for Diabetes and hypertension and COPD recheck.  Counseling provided for all of the vaccine components Orders Placed This Encounter  Procedures   For home use only DME oxygen    Bayer DCA Hb A1c Waived   Ambulatory referral to Pulmonology    Fonda Levins, MD [facility] 09/04/2024, 1:43 PM

## 2024-09-05 DIAGNOSIS — J449 Chronic obstructive pulmonary disease, unspecified: Secondary | ICD-10-CM | POA: Diagnosis not present

## 2024-09-10 ENCOUNTER — Other Ambulatory Visit: Payer: Self-pay | Admitting: Cardiology

## 2024-09-12 NOTE — Progress Notes (Signed)
 New order for specific Inogen POC was sent to [facility] day of visit via community message. Terri w/ [facility] responded that they can not order specific POC's. Most are made by [employer], some are [employer].

## 2024-09-16 ENCOUNTER — Other Ambulatory Visit: Payer: Self-pay | Admitting: Cardiology

## 2024-09-16 ENCOUNTER — Other Ambulatory Visit: Payer: Self-pay | Admitting: Family Medicine

## 2024-09-16 DIAGNOSIS — I5043 Acute on chronic combined systolic (congestive) and diastolic (congestive) heart failure: Secondary | ICD-10-CM

## 2024-09-20 ENCOUNTER — Telehealth: Payer: Self-pay

## 2024-09-20 NOTE — Telephone Encounter (Signed)
 Wife also states that they have not heard anything about Lung Cancer screening. Will send message to referrals to check on this. Referral placed on 9/22.  Wife also has not heard anything about portable O2 with [employer]. Will route message to home health as well.

## 2024-09-20 NOTE — Telephone Encounter (Signed)
 Dr. Scottie always refills Bystolic . Wife confirmed that she will continue to have him fill it.

## 2024-09-20 NOTE — Telephone Encounter (Signed)
 Copied from CRM [id number]. Topic: Clinical - Medication Question >> Sep 20, 2024  1:33 PM Nathanel BROCKS wrote: Reason for CRM: pt spouse called about a medication and she refused to speak to my or my E2C2 nurse team. Please call her back. [phone]

## 2024-09-21 NOTE — Telephone Encounter (Signed)
 Informed wife that I had sent the new order for specific Inogen POC was sent to [facility] day of visit via community message. Terri w/ [facility] responded that they can not order specific POC's. Most are made by [employer], some are [employer]. They have an appt next week w/ Pulmonology.

## 2024-09-21 NOTE — Telephone Encounter (Signed)
 Pulmonology Referral sent to [facility] in [place] as Patient requested.  MyChart Message sent to Patient with Specialty Office contact information.

## 2024-09-25 ENCOUNTER — Encounter: Payer: Self-pay | Admitting: Internal Medicine

## 2024-09-25 ENCOUNTER — Ambulatory Visit: Admitting: Internal Medicine

## 2024-09-25 ENCOUNTER — Ambulatory Visit (HOSPITAL_COMMUNITY)
Admission: RE | Admit: 2024-09-25 | Discharge: 2024-09-25 | Disposition: A | Discharge status: Home / Self Care | Source: Ambulatory Visit | Attending: Internal Medicine | Admitting: Internal Medicine

## 2024-09-25 VITALS — BP 120/64 | HR 92 | Ht 67.0 in | Wt 121.8 lb

## 2024-09-25 DIAGNOSIS — J9611 Chronic respiratory failure with hypoxia: Secondary | ICD-10-CM | POA: Diagnosis not present

## 2024-09-25 DIAGNOSIS — Z87891 Personal history of nicotine dependence: Secondary | ICD-10-CM | POA: Diagnosis not present

## 2024-09-25 DIAGNOSIS — J449 Chronic obstructive pulmonary disease, unspecified: Secondary | ICD-10-CM

## 2024-09-25 DIAGNOSIS — R059 Cough, unspecified: Secondary | ICD-10-CM | POA: Diagnosis not present

## 2024-09-25 DIAGNOSIS — R918 Other nonspecific abnormal finding of lung field: Secondary | ICD-10-CM | POA: Diagnosis not present

## 2024-09-25 DIAGNOSIS — R0602 Shortness of breath: Secondary | ICD-10-CM | POA: Diagnosis not present

## 2024-09-25 DIAGNOSIS — J439 Emphysema, unspecified: Secondary | ICD-10-CM | POA: Diagnosis not present

## 2024-09-25 MED ORDER — BREZTRI AEROSPHERE 160-9-4.8 MCG/ACT IN AERO: 2.0000 | INHALATION_SPRAY | Freq: Two times a day (BID) | RESPIRATORY_TRACT | Status: AC

## 2024-09-25 NOTE — Patient Instructions (Addendum)
 Plan A = Automatic = Always=    Breztri  Take 2 puffs first thing in am and then another 2 puffs about 12 hours later.    Work on inhaler technique:  relax and gently blow all the way out then take a nice smooth full deep breath back in, triggering the inhaler at same time you start breathing in.  Hold breath in for at least  5 seconds if you can. Blow out breztri   thru nose. Rinse and gargle with water when done.  If mouth or throat bother you at all,  try brushing teeth/gums/tongue with arm and hammer toothpaste/ make a slurry and gargle and spit out.      Plan B = Backup (to supplement plan A, not to replace it) Use your albuterol  inhaler as a rescue medication to be used if you can't catch your breath by resting or slowing your pace  or doing a relaxed purse lip breathing pattern.  - The less you use it, the better it will work when you need it. - Ok to use the inhaler up to 2 puffs  every 4 hours if you must but call for appointment if use goes up over your usual need - Don't leave home without it !!  (think of it like the spare tire or starter fluid for your car)    Also  Ok to try albuterol  15 min before an activity (on alternating days)  that you know would usually make you short of breath and see if it makes any difference and if makes none then don't take albuterol  after activity unless you can't catch your breath as this means it's the resting that helps, not the albuterol .       We will order humidity for your oxygen  and would try 2lpm at bedtime and none needed at rest.   However, make sure you check your oxygen  saturation  AT  your highest level of activity (not after you stop)   to be sure it stays over 90% and adjust  02 flow upward to maintain this level if needed but remember to turn it back to previous settings (may be as low as 0) when you stop (to conserve your supply).   Please remember to go to the lab department   for your tests - we will call you with the results when they  are available.      Please remember to go to the  x-ray department  for your tests - we will call you with the results when they are available     Please schedule a follow up office visit in 6 weeks, call sooner if needed with all medications /inhalers/ solutions/portable 02 system  in hand so we can verify exactly what you are taking. This includes all medications from all doctors and over the counters

## 2024-09-25 NOTE — Progress Notes (Signed)
 Luis Salinas, male    DOB: [birth year]    MRN: [id number]   Brief patient profile:  21   yowm  quit cigarette smoking 2014 but still smokes cigars referred to pulmonary clinic in [place]  09/25/2024 by [facility]  for dx of copd  / 02 dep  x 4 years p d/c from [facility] with course complicated by claudication   Pt not previously seen by PCCM service.     History of Present Illness  09/25/2024  Pulmonary/ 1st office eval/ Shamiya Demeritt /  Office on Breztri   Chief Complaint  Patient presents with   Establish Care    Shob - coughing with clear mucus - pt states his lung are full of fluid   Dyspnea:  to the car still limited by legs > sob  Cough: first thing in am most productive while  Sleep: 45 degrees x years  SABA use: not clear how much actually uses   02: 4 lpm  LDSCT > declined    No obvious day to day or daytime pattern/variability or assoc excess/ purulent sputum or mucus plugs or hemoptysis or cp or chest tightness, subjective wheeze or overt   hb symptoms.    Also denies any obvious fluctuation of symptoms with weather or environmental changes or other aggravating or alleviating factors except as outlined above   No unusual exposure hx or h/o childhood pna/ asthma or knowledge of premature birth.  Current Allergies, Complete Past Medical History, Past Surgical History, Family History, and Social History were reviewed in [employer] record.  ROS  The following are not active complaints unless bolded Hoarseness, sore throat, dysphagia, dental problems, itching, sneezing,  nasal congestion or discharge of excess mucus or purulent secretions, ear ache,   fever, chills, sweats, unintended wt loss or wt gain, classically pleuritic or exertional cp,  orthopnea pnd or arm/hand swelling  or leg swelling, presyncope, palpitations, abdominal pain, anorexia, nausea, vomiting, diarrhea  or change in bowel habits or change in bladder habits, change in stools or  change in urine, dysuria, hematuria,  rash, arthralgias, visual complaints, headache, numbness, weakness or ataxia or problems with walking or coordination,  change in mood or  memory.            Outpatient Medications Prior to Visit - - NOTE:   Unable to verify as accurately reflecting what pt takes    Medication Sig Dispense Refill   ACCU-CHEK GUIDE test strip USE TO CHECK SUGAR UP TO 4 TIMES DAILY AS DIRECTED     Accu-Chek Softclix Lancets lancets SMARTSIG:Topical 1-4 Times Daily     acetaminophen  (TYLENOL ) 650 MG CR tablet Take 650-1,300 mg by mouth every 8 (eight) hours as needed for pain.     albuterol  (VENTOLIN  HFA) 108 (90 Base) MCG/ACT inhaler Inhale 2 puffs into the lungs every 6 (six) hours as needed for wheezing or shortness of breath. 18 each 1   apixaban  (ELIQUIS ) 5 MG TABS tablet Take 1 tablet (5 mg total) by mouth 2 (two) times daily. 180 tablet 3   aspirin  EC ([employer]S ASPIRIN  LOW DOSE) 81 MG tablet Take 1 tablet (81 mg total) by mouth daily. 90 tablet 3   b complex vitamins capsule Take 1 capsule by mouth daily.     blood glucose meter kit and supplies KIT Dispense based on patient and insurance preference. Use up to four times daily as directed. 1 each 0   Budeson-Glycopyrrol-Formoterol  (BREZTRI  AEROSPHERE) 160-9-4.8 MCG/ACT AERO  Pt did recognize  a sample of this inhaler  11   clopidogrel  (PLAVIX ) 75 MG tablet Take 1 tablet (75 mg total) by mouth daily at 6 (six) AM. 30 tablet 2   diphenhydramine-acetaminophen  (TYLENOL  PM) 25-500 MG TABS tablet Take 1-2 tablets by mouth at bedtime as needed (sleep/pain.).     furosemide  (LASIX ) 40 MG tablet Take 1 tablet (40 mg total) by mouth daily. 90 tablet 3   Multiple Vitamins-Minerals (PRESERVISION AREDS 2 PO) Take by mouth.     nebivolol  (BYSTOLIC ) 10 MG tablet TAKE 1 TABLET BY MOUTH EVERY DAY 90 tablet 0   nebivolol  (BYSTOLIC ) 5 MG tablet TAKE 1 TABLET (5 MG TOTAL) BY MOUTH DAILY. WITH 10 MG TABLET TO EQUAL 15 MG DAILY 15 tablet 0    nitroGLYCERIN  (NITROSTAT ) 0.4 MG SL tablet Place 1 tablet (0.4 mg total) under the tongue every 5 (five) minutes as needed for chest pain. 50 tablet 3   OXYGEN  Inhale 4 L into the lungs daily.     potassium chloride  SA (KLOR-CON  M) 20 MEQ tablet TAKE 1 TABLET BY MOUTH EVERY DAY 90 tablet 0   rosuvastatin  (CRESTOR ) 40 MG tablet Take 1 tablet (40 mg total) by mouth daily. 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 His family found him after 27 years   Arthritis    Cataract    Chronic diastolic CHF (congestive heart failure) (HCC)    COPD (chronic obstructive pulmonary disease) (HCC)    Dyslipidemia    History of concussion    History of STEMI    a. 11/2007 s/p DES to LCX at [facility]   Hypertension    On home oxygen  therapy    4L/   Paroxysmal atrial fibrillation (HCC)    Pneumonia    exposure to caustic chemicals   Stroke [facility])    multiple strokes   Tobacco abuse    Type 2 diabetes mellitus (HCC)       Objective:     BP 120/64   Pulse 92   Ht 5' 7 (1.702 m)   Wt 121 lb 12.8 oz (55.2 kg)   SpO2 92% Comment: ra  BMI 19.08 kg/m   SpO2: 92 % (ra) thin hoarse amb wm nad   HEENT : Oropharynx  clear       NECK :  without  apparent JVD/ palpable Nodes/TM    LUNGS: no acc muscle use,  Mild barrel  contour chest wall with bilateral  Distant bs s audible wheeze and  without cough on insp or exp maneuvers  and mild  Hyperresonant  to  percussion bilaterally     CV:  RRR  no s3 or murmur or increase in P2, and no edema   ABD:  soft and nontender   MS:  Nl gait/ ext warm without deformities Or obvious joint restrictions  calf tenderness, cyanosis or clubbing     SKIN: warm and dry without lesions    NEURO:  alert, approp, nl sensorium with  no motor or cerebellar deficits apparent.       CXR PA and Lateral:   09/25/2024 :    I  personally reviewed images and impression is as follows:   Mild kyphosis/ mild copd/ no acute findings  Overread by radiology 1. Emphysema and pulmonary hyperinflation. No acute appearing airspace  opacity. 2. Pleural calcification about the right lung base, suggesting prior hemothorax or pyothorax.       Assessment   Assessment & Plan COPD GOLD ?  AB Group E/ MM/quit smoking cigs 2011 / inhaling cigars as of 1st pulmonary eval 09/25/2024   - 09/25/2024  After extensive coaching inhaler device,  effectiveness =   70% with hfa from baseline 50% - Allergy screen 09/25/2024 >  Eos 0.6 /  alpha one AT  MM/185    Group E in terms of symptoms/risk so  laba/lama/ICS  therefore appropriate rx at this point >>>  continue breztri  but work on consistent application/ technique   and approp SABA prn.   >>> PFTs when available    Chronic respiratory failure with hypoxia (HCC) Placed on 02 x 2021  - 09/25/2024   Walked on RA  x  3  lap(s) =  approx 450   ft  @ slow  pace desat to 87% p 333ft and placed on 2 L cont to remain above 90%  >>> so rec no 02 at rest but 2lpm hs and walking outside confines of house   >>>  advised goal of 02 is keep sats > 90% esp with ex so may need > 2lpm depending on level of exertion and ok to titrate up accordingly    Each maintenance medication was reviewed in detail including emphasizing most importantly the difference between maintenance and prns and under what circumstances the prns are to be triggered using an action plan format where appropriate.  Total time for H and P, chart review, counseling, reviewing hfa/02/pulse ox device(s) , directly observing portions of ambulatory 02 saturation study/ and generating customized AVS unique to this office visit / same day charting = new pt eval                AVS  Patient Instructions  Plan A = Automatic = Always=    Breztri  Take 2 puffs first thing in am and then another 2 puffs about 12 hours later.     Work on inhaler technique:  relax and gently blow all the way out then take a nice smooth full deep breath back in, triggering the inhaler at same time you start breathing in.  Hold breath in for at least  5 seconds if you can. Blow out breztri   thru nose. Rinse and gargle with water when done.  If mouth or throat bother you at all,  try brushing teeth/gums/tongue with arm and hammer toothpaste/ make a slurry and gargle and spit out.      Plan B = Backup (to supplement plan A, not to replace it) Use your albuterol  inhaler as a rescue medication to be used if you can't catch your breath by resting or slowing your pace  or doing a relaxed purse lip breathing pattern.  - The less you use it, the better it will work when you need it. - Ok to use the inhaler up to 2 puffs  every 4 hours if you must but call for appointment if use goes up over your usual need - Don't leave home without it !!  (think of it like the spare tire or starter fluid for your car)    Also  Ok to try albuterol  15 min before an activity (on alternating days)  that you know would usually make you short of breath and see if it makes any difference and if makes none then don't take albuterol  after activity  unless you can't catch your breath as this means it's the resting that helps, not the albuterol .       We will order humidity for your oxygen  and would try 2lpm at bedtime and none needed at rest.   However, make sure you check your oxygen  saturation  AT  your highest level of activity (not after you stop)   to be sure it stays over 90% and adjust  02 flow upward to maintain this level if needed but remember to turn it back to previous settings (may be as low as 0) when you stop (to conserve your supply).   Please remember to go to the lab department   for your tests - we will call you with the results when they are available.      Please remember to go to the  x-ray department  for your tests - we will call you with the results  when they are available     Please schedule a follow up office visit in 6 weeks, call sooner if needed with all medications /inhalers/ solutions/portable 02 system  in hand so we can verify exactly what you are taking. This includes all medications from all doctors and over the counters    Ozell America, MD 09/26/2024

## 2024-09-25 NOTE — Telephone Encounter (Signed)
 Copied from CRM [id number]. Topic: Clinical - Medication Question >> Sep 25, 2024  4:36 PM Rozanna MATSU wrote: Reason for CRM: Spouse called to confirm if he needs 2liters of oxy or 4 liters she was not sure per the notes from Dr Darlean on todays visit

## 2024-09-26 ENCOUNTER — Telehealth: Payer: Self-pay | Admitting: Internal Medicine

## 2024-09-26 ENCOUNTER — Ambulatory Visit: Payer: Self-pay | Admitting: Internal Medicine

## 2024-09-26 DIAGNOSIS — J9611 Chronic respiratory failure with hypoxia: Secondary | ICD-10-CM | POA: Insufficient documentation

## 2024-09-26 NOTE — Telephone Encounter (Signed)
 Needs pfts at return ov if possible

## 2024-09-26 NOTE — Assessment & Plan Note (Addendum)
 Placed on 02 x 2021  - 09/25/2024   Walked on RA  x  3  lap(s) =  approx 450   ft  @ slow  pace desat to 87% p 378ft and placed on 2 L cont to remain above 90%  >>> so rec no 02 at rest but 2lpm hs and walking outside confines of house   >>>  advised goal of 02 is keep sats > 90% esp with ex so may need > 2lpm depending on level of exertion and ok to titrate up accordingly    Each maintenance medication was reviewed in detail including emphasizing most importantly the difference between maintenance and prns and under what circumstances the prns are to be triggered using an action plan format where appropriate.  Total time for H and P, chart review, counseling, reviewing hfa/02/pulse ox device(s) , directly observing portions of ambulatory 02 saturation study/ and generating customized AVS unique to this office visit / same day charting = new pt eval

## 2024-09-26 NOTE — Progress Notes (Signed)
 Atc x1 lmtcb

## 2024-09-26 NOTE — Assessment & Plan Note (Addendum)
 Group E/ MM/quit smoking cigs 2011 / inhaling cigars as of 1st pulmonary eval 09/25/2024   - 09/25/2024  After extensive coaching inhaler device,  effectiveness =   70% with hfa from baseline 50% - Allergy screen 09/25/2024 >  Eos 0.6 /  alpha one AT  MM/185    Group E in terms of symptoms/risk so  laba/lama/ICS  therefore appropriate rx at this point >>>  continue breztri  but work on consistent application/ technique   and approp SABA prn.   >>> PFTs when available

## 2024-09-27 ENCOUNTER — Telehealth: Payer: Self-pay | Admitting: Internal Medicine

## 2024-09-27 ENCOUNTER — Other Ambulatory Visit: Payer: Self-pay

## 2024-09-27 DIAGNOSIS — J449 Chronic obstructive pulmonary disease, unspecified: Secondary | ICD-10-CM

## 2024-09-27 NOTE — Telephone Encounter (Signed)
 PFT a next OV - order placed

## 2024-09-27 NOTE — Telephone Encounter (Signed)
 Pt

## 2024-09-28 LAB — CBC WITH DIFFERENTIAL/PLATELET
Basophils Absolute: 0.1 x10E3/uL (ref 0.0–0.2)
Basos: 1 %
EOS (ABSOLUTE): 0.6 x10E3/uL — ABNORMAL HIGH (ref 0.0–0.4)
Eos: 6 %
Hematocrit: 46.2 % (ref 37.5–51.0)
Hemoglobin: 14.2 g/dL (ref 13.0–17.7)
Immature Grans (Abs): 0 x10E3/uL (ref 0.0–0.1)
Immature Granulocytes: 0 %
Lymphocytes Absolute: 3.1 x10E3/uL (ref 0.7–3.1)
Lymphs: 31 %
MCH: 28.1 pg (ref 26.6–33.0)
MCHC: 30.7 g/dL — ABNORMAL LOW (ref 31.5–35.7)
MCV: 91 fL (ref 79–97)
Monocytes Absolute: 0.7 x10E3/uL (ref 0.1–0.9)
Monocytes: 7 %
Neutrophils Absolute: 5.4 x10E3/uL (ref 1.4–7.0)
Neutrophils: 55 %
Platelets: 174 x10E3/uL (ref 150–450)
RBC: 5.06 x10E6/uL (ref 4.14–5.80)
RDW: 13.6 % (ref 11.6–15.4)
WBC: 9.8 x10E3/uL (ref 3.4–10.8)

## 2024-09-28 LAB — ALPHA-1-ANTITRYPSIN PHENOTYP: A-1 Antitrypsin: 185 mg/dL (ref 101–187)

## 2024-09-28 LAB — BASIC METABOLIC PANEL WITH GFR
BUN/Creatinine Ratio: 14 (ref 10–24)
BUN: 15 mg/dL (ref 8–27)
CO2: 25 mmol/L (ref 20–29)
Calcium: 9.4 mg/dL (ref 8.6–10.2)
Chloride: 100 mmol/L (ref 96–106)
Creatinine, Ser: 1.09 mg/dL (ref 0.76–1.27)
Glucose: 82 mg/dL (ref 70–99)
Potassium: 4.1 mmol/L (ref 3.5–5.2)
Sodium: 142 mmol/L (ref 134–144)
eGFR: 69 mL/min/1.73 (ref >59)

## 2024-09-28 LAB — BRAIN NATRIURETIC PEPTIDE: BNP: 86.7 pg/mL (ref 0.0–100.0)

## 2024-09-30 ENCOUNTER — Other Ambulatory Visit: Payer: Self-pay | Admitting: Cardiology

## 2024-10-04 ENCOUNTER — Telehealth: Payer: Self-pay

## 2024-10-04 NOTE — Progress Notes (Signed)
 Called to relay cxr results,spoke with pt wife Grayce in dpr, she confirmed understanding

## 2024-10-04 NOTE — Telephone Encounter (Signed)
 Copied from CRM #[id number]. Topic: Clinical - Lab/Test Results >> Sep 26, 2024  2:52 PM Chantha C wrote: Reason for CRM: Patient's spouse Grayce [phone] is returning the office call on patient's chest xray results. Unable to connect [employer], keeps going to [facility] [place] [employer]. Please advise and call back.  See phone note from10/22

## 2024-10-04 NOTE — Telephone Encounter (Signed)
 Copied from CRM [id number]. Topic: Clinical - Lab/Test Results >> Sep 27, 2024  3:14 PM Essie A wrote: Reason for CRM: Patient's wife, Grayce, called the office back to CT results for patient.  Transferred to [facility]. >> Sep 27, 2024  3:29 PM Isabell A wrote: Spouse is calling for xray results - has additional questions, states there has been changes to his oxygen  - he is currently on 4 liters.   See phone note

## 2024-10-05 ENCOUNTER — Other Ambulatory Visit: Payer: Self-pay | Admitting: Family Medicine

## 2024-10-05 ENCOUNTER — Encounter (HOSPITAL_COMMUNITY)

## 2024-10-05 DIAGNOSIS — J449 Chronic obstructive pulmonary disease, unspecified: Secondary | ICD-10-CM | POA: Diagnosis not present

## 2024-10-05 DIAGNOSIS — I2510 Atherosclerotic heart disease of native coronary artery without angina pectoris: Secondary | ICD-10-CM

## 2024-10-09 NOTE — Progress Notes (Unsigned)
 Cardiology Clinic Note   Patient Name: Luis Salinas Date of Encounter: 10/10/2024  Primary Care Provider:  Dettinger, Fonda LABOR, MD Primary Cardiologist:  Lonni LITTIE Nanas, MD  Patient Profile    Luis Salinas 78 year old male presents to the clinic today for follow-up evaluation of his coronary artery disease, paroxysmal atrial fibrillation, and hypertension.  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 His family found him after 27 years   Arthritis    Cataract    Chronic diastolic CHF (congestive heart failure) (HCC)    COPD (chronic obstructive pulmonary disease) (HCC)    Dyslipidemia    History of concussion    History of STEMI    a. 11/2007 s/p DES to LCX at [facility]   Hypertension    On home oxygen  therapy    4L/[place]   Paroxysmal atrial fibrillation (HCC)    Pneumonia    exposure to caustic chemicals   Stroke [facility])    multiple strokes   Tobacco abuse    Type 2 diabetes mellitus (HCC)    Past Surgical History:  Procedure Laterality Date   ABDOMINAL AORTOGRAM N/A 04/21/2024   Procedure: ABDOMINAL AORTOGRAM;  Surgeon: Magda Debby SAILOR, MD;  Location: [facility] INVASIVE CV LAB;  Service: Cardiovascular;  Laterality: N/A;   ANTERIOR CERVICAL DECOMP/DISCECTOMY FUSION  05/16/2012   Procedure: ANTERIOR CERVICAL DECOMPRESSION/DISCECTOMY FUSION 2 LEVELS;  Surgeon: Victory Gens, MD;  Location: [facility] NEURO ORS;  Service: Neurosurgery;  Laterality: N/A;  Cervical Five-Six, Cervical Six-Seven Anterior Cervical Decompression/Diskectomy Fusion   CARDIAC CATHETERIZATION     CARDIOVERSION N/A 11/10/2021   Procedure: CARDIOVERSION;  Surgeon: Delford Maude BROCKS, MD;  Location: [facility] ENDOSCOPY;  Service: Cardiovascular;  Laterality: N/A;   COLONOSCOPY W/ POLYPECTOMY     CORONARY ANGIOPLASTY     ENDARTERECTOMY FEMORAL Left 04/26/2024   Procedure: ENDARTERECTOMY, FEMORAL WITH ANGIOPLASTY;  Surgeon: Magda Debby SAILOR, MD;   Location: [facility] OR;  Service: Vascular;  Laterality: Left;   EYE SURGERY     cataracts   FEMORAL-POPLITEAL BYPASS GRAFT Left 04/26/2024   Procedure: BYPASS GRAFT FEMORAL-POPLITEAL ARTERY WITH LEFT GREATER SAPHENOUS VEIN HARVEST;  Surgeon: Magda Debby SAILOR, MD;  Location: [facility] OR;  Service: Vascular;  Laterality: Left;   INSERTION OF ILIAC STENT Left 04/26/2024   Procedure: INSERTION, EXTERNAL ILIAC STENT, ARTERY;  Surgeon: Magda Debby SAILOR, MD;  Location: [facility] OR;  Service: Vascular;  Laterality: Left;   JOINT REPLACEMENT     Left knee replacement   JOINT REPLACEMENT     left elbow at age 35   LOWER EXTREMITY ANGIOGRAM Left 04/26/2024   Procedure: GERALYN, LOWER EXTREMITY;  Surgeon: Magda Debby SAILOR, MD;  Location: [facility] OR;  Service: Vascular;  Laterality: Left;   LOWER EXTREMITY ANGIOGRAPHY N/A 04/21/2024   Procedure: Lower Extremity Angiography;  Surgeon: Magda Debby SAILOR, MD;  Location: [facility] INVASIVE CV LAB;  Service: Cardiovascular;  Laterality: N/A;   LOWER EXTREMITY INTERVENTION N/A 04/21/2024   Procedure: LOWER EXTREMITY INTERVENTION;  Surgeon: Magda Debby SAILOR, MD;  Location: [facility] INVASIVE CV LAB;  Service: Cardiovascular;  Laterality: N/A;   ROTATOR CUFF REPAIR     left   TONSILLECTOMY      Allergies  No Known Allergies  History of Present Illness    Luis Salinas has a PMH of hypertension, diabetes, CVA, chronic diastolic CHF, coronary artery disease status post STEMI 2008  with BMS to his circumflex (catheterization performed at [facility]).  His PMH also includes COPD, HTN, tobacco use, and atrial fibrillation.  CHA2DS2-VASc score 8.  He was seen by Dr. Kate on 12/25/2021.  During that time it was noted that he had been admitted 10/22 with acute on chronic diastolic CHF.  He improved with diuresis.  He was also found to have atrial fibrillation which was a new diagnosis.  He was started on Eliquis  and Bystolic .  He underwent cardioversion 11/04/2021.  At follow-up he was noted to have a weight  loss of 9 pounds, denied bleeding issues and reported compliance with apixaban .  He denied chest pain, shortness of breath, lightheadedness, syncope and lower extremity swelling.  He was seen in follow-up by Quita Kicks, PA on 02/17/2022.  He reported feeling great.  He was noncardiac aware.  He reported that his weight had been stable.  He was not noted to have any lower extremity swelling.  He denied bleeding issues and reported compliance with his apixaban .  He denied palpitations, chest pain, orthopnea, lower extremity swelling, syncope, snoring, neurologic changes.  He was tolerating his medications well and had no complaints at that time.  He presented to the clinic 03/18/22 for follow-up evaluation stated he felt well.  He was not very active due to his COPD.  He presented to the clinic  without oxygen .  However, he wore oxygen  at home.  He denied bleeding issues and reported compliance with his apixaban  medication.  He did note that he missed 1 dose yesterday evening.  He has a history of CVA which affected his short-term memory.  His wife helped him with his medication.  He also reported that he had amnesia for around 26 years.  He was tracked down by a photographer and returned to [place]  after that time.  I continued his current medication therapy.  Asked him to increase his physical activity as tolerated and planned follow-up for 6 months.  He presented to the clinic 06/16/23 for follow-up evaluation and stated he continued to do well from a heart standpoint.  His EKG  showed sinus rhythm with first-degree AV block left axis deviation.  He reported that 6 months ago he took a sublingual nitroglycerin  for some rib pain.  He denied exertional chest discomfort.  He did notice occasional discomfort which he felt was related to musculoskeletal issues.  He continued to use 4 L of oxygen  at home.  He presented to the clinic  without oxygen .  On exam he had some inspiratory wheezes.  He reported  that he had constant cough and produces white phlegm.  I continued his current medication regimen and planned follow-up in 12 months.  He presents to the clinic today for follow-up evaluation and states he continues to do well.  He has no cardiac complaints.  He presents without oxygen  in the clinic again today.  His oxygen  saturation is 87%.  He notes increased heart rate only with increased physical activity.  He typically wears 4 L of oxygen  at home.  His blood pressure today is 90/56.  His pulse is 57.  I will decrease his Nebivolol  to 10 mg daily from 15.  I have asked him to take an extra 5 mg as needed for palpitations or if having accelerated heart rate.  He and his wife expressed understanding.  He had recent lab work.  We reviewed this.  I will plan follow-up in 9 to 12 months.  His wife also reports  that he has not taken Jardiance  in almost 12 months.  At this time he would not tolerate Jardiance  due to soft blood pressure.  Today he denies chest pain, shortness of breath, lower extremity edema, fatigue, palpitations, melena, hematuria, hemoptysis, diaphoresis, weakness, presyncope, syncope, orthopnea, and PND.   Home Medications    Prior to Admission medications   Medication Sig Start Date End Date Taking? Authorizing Provider  ACCU-CHEK GUIDE test strip USE TO CHECK SUGAR UP TO 4 TIMES DAILY AS DIRECTED 09/24/21   [provider]  Accu-Chek Softclix Lancets lancets SMARTSIG:Topical 1-4 Times Daily 09/25/21   [provider]  Albuterol  Sulfate 108 (90 Base) MCG/ACT AEPB Inhale 2 puffs into the lungs every 6 (six) hours as needed (shortness of breath/ wheeze). 04/09/18   Jolinda Norene HERO, DO  apixaban  (ELIQUIS ) 5 MG TABS tablet Take 1 tablet (5 mg total) by mouth 2 (two) times daily. 10/01/21   Dettinger, Fonda LABOR, MD  aspirin  81 MG EC tablet TAKE 1 TABLET (81 MG TOTAL) BY MOUTH DAILY. SWALLOW WHOLE. 10/23/21   Dettinger, Fonda LABOR, MD  blood glucose meter kit and  supplies KIT Dispense based on patient and insurance preference. Use up to four times daily as directed. 09/24/21   Ezenduka, Nkeiruka J, MD  Budeson-Glycopyrrol-Formoterol  (BREZTRI  AEROSPHERE) 160-9-4.8 MCG/ACT AERO Inhale 2 puffs into the lungs 2 (two) times daily. 09/04/21   Dettinger, Fonda LABOR, MD  diphenhydramine-acetaminophen  (TYLENOL  PM) 25-500 MG TABS tablet Take 1 tablet by mouth at bedtime as needed (sleep/pain.).    [provider]  empagliflozin  (JARDIANCE ) 10 MG TABS tablet Take 1 tablet (10 mg total) by mouth daily. 10/01/21   Dettinger, Fonda LABOR, MD  furosemide  (LASIX ) 40 MG tablet Take 1 tablet (40 mg total) by mouth daily. 10/01/21   Dettinger, Fonda LABOR, MD  Menthol -Methyl Salicylate (SALONPAS PAIN RELIEF PATCH) PTCH Place 1 patch onto the skin daily as needed (pain.).    [provider]  Multiple Vitamins-Minerals (ICAPS AREDS FORMULA PO) Take 1 capsule by mouth 2 (two) times daily.    [provider]  nebivolol  (BYSTOLIC ) 10 MG tablet Take 1.5 tablets (15 mg total) by mouth daily. 12/25/21   Kate Lonni CROME, MD  nitroGLYCERIN  (NITROSTAT ) 0.4 MG SL tablet Place 1 tablet (0.4 mg total) under the tongue every 5 (five) minutes as needed for chest pain. 11/26/21   Dettinger, Fonda LABOR, MD  OXYGEN  Inhale 4 L into the lungs daily.    [provider]  potassium chloride  SA (KLOR-CON ) 20 MEQ tablet Take 1 tablet (20 mEq total) by mouth daily. 10/01/21   Dettinger, Fonda LABOR, MD  rosuvastatin  (CRESTOR ) 40 MG tablet Take 1 tablet (40 mg total) by mouth daily. 11/26/21   Dettinger, Fonda LABOR, MD    Family History    Family History  Problem Relation Age of Onset   Heart disease Mother    COPD Father    Aneurysm Father    Anesthesia problems Neg Hx    Hypotension Neg Hx    Malignant hyperthermia Neg Hx    Pseudochol deficiency Neg Hx    He indicated that his mother is deceased. He indicated that his father is deceased. He indicated that all of his  four sisters are alive. He indicated that his brother is alive. He indicated that the status of his neg hx is unknown.  Social History    Social History   Socioeconomic History   Marital status: Married    Spouse name: Grayce  Number of children: 7   Years of education: college   Highest education level: Some college, no degree  Occupational History   Occupation: Retired    Comment: proofreader  Tobacco Use   Smoking status: Light Smoker    Types: Cigars    Last attempt to quit: 04/03/2013    Years since quitting: 11.5   Smokeless tobacco: Never   Tobacco comments:    1-2 cigars per day.  Smoked 40 years cigarettes.  10 years ago quit cigarettes.  Vaping Use   Vaping status: Never Used  Substance and Sexual Activity   Alcohol use: No    Alcohol/week: 0.0 standard drinks of alcohol   Drug use: Yes    Types: Marijuana    Comment: offer he will smoke 01/20/22   Sexual activity: Yes  Other Topics Concern   Not on file  Social History Narrative   Lives with his fourth wife.    Says his family found him in 1992 and he had no recollection of his childhood -believes to have had several head injuries    Social Drivers of Health   Financial Resource Strain: Low Risk  (06/08/2024)   Overall Financial Resource Strain (CARDIA)    Difficulty of Paying Living Expenses: Not hard at all  Food Insecurity: No Food Insecurity (06/08/2024)   Hunger Vital Sign    Worried About Running Out of Food in the Last Year: Never true    Ran Out of Food in the Last Year: Never true  Recent Concern: Food Insecurity - Food Insecurity Present (05/02/2024)   Hunger Vital Sign    Worried About Running Out of Food in the Last Year: Sometimes true    Ran Out of Food in the Last Year: Sometimes true  Transportation Needs: No Transportation Needs (06/08/2024)   PRAPARE - Administrator, Civil Service (Medical): No    Lack of Transportation (Non-Medical): No  Physical Activity: Insufficiently Active  (06/08/2024)   Exercise Vital Sign    Days of Exercise per Week: 2 days    Minutes of Exercise per Session: 20 min  Stress: No Stress Concern Present (06/08/2024)   [employer] of Occupational Health - Occupational Stress Questionnaire    Feeling of Stress: Not at all  Social Connections: Socially Integrated (04/28/2024)   Social Connection and Isolation Panel    Frequency of Communication with Friends and Family: Never    Frequency of Social Gatherings with Friends and Family: More than three times a week    Attends Religious Services: 1 to 4 times per year    Active Member of [employer] or Organizations: Yes    Attends Banker Meetings: Never    Marital Status: Married  Catering Manager Violence: Not At Risk (06/08/2024)   Humiliation, Afraid, Rape, and Kick questionnaire    Fear of Current or Ex-Partner: No    Emotionally Abused: No    Physically Abused: No    Sexually Abused: No     Review of Systems    General:  No chills, fever, night sweats or weight changes.  Cardiovascular:  No chest pain, dyspnea on exertion, edema, orthopnea, palpitations, paroxysmal nocturnal dyspnea. Dermatological: No rash, lesions/masses Respiratory: No cough, dyspnea Urologic: No hematuria, dysuria Abdominal:   No nausea, vomiting, diarrhea, bright red blood per rectum, melena, or hematemesis Neurologic:  No visual changes, wkns, changes in mental status. All other systems reviewed and are otherwise negative except as noted above.  Physical Exam  VS:  BP (!) 90/56   Pulse (!) 57   Ht 5' 7.5 (1.715 m)   Wt 123 lb 6.4 oz (56 kg)   SpO2 (!) 87%   BMI 19.04 kg/m  , BMI Body mass index is 19.04 kg/m. GEN: Well nourished, well developed, in no acute distress. HEENT: normal. Neck: Supple, no JVD, carotid bruits, or masses. Cardiac: RRR, no murmurs, rubs, or gallops. No clubbing, cyanosis, edema.  Radials/DP/PT 2+ and equal bilaterally.  Respiratory:  Respirations regular and  unlabored, inspiratory wheeze bilaterally. GI: Soft, nontender, nondistended, BS + x 4. MS: no deformity or atrophy. Skin: warm and dry, no rash. Neuro:  Strength and sensation are intact. Psych: Normal affect.  Accessory Clinical Findings    Recent Labs: 06/02/2024: ALT 24 09/25/2024: BNP 86.7; BUN 15; Creatinine, Ser 1.09; Hemoglobin 14.2; Platelets 174; Potassium 4.1; Sodium 142   Recent Lipid Panel    Component Value Date/Time   CHOL 74 04/28/2024 0521   CHOL 100 03/02/2024 1019   CHOL 114 05/01/2013 1302   TRIG 78 04/28/2024 0521   TRIG 88 03/29/2015 1106   TRIG 141 05/01/2013 1302   HDL 32 (L) 04/28/2024 0521   HDL 46 03/02/2024 1019   HDL 38 (L) 03/29/2015 1106   HDL 34 (L) 05/01/2013 1302   CHOLHDL 2.3 04/28/2024 0521   VLDL 16 04/28/2024 0521   LDLCALC 26 04/28/2024 0521   LDLCALC 37 03/02/2024 1019   LDLCALC 52 05/01/2013 1302    ECG personally reviewed by me today-EKG Interpretation Date/Time:  Tuesday October 10 2024 10:22:14 EDT Ventricular Rate:  57 PR Interval:  226 QRS Duration:  84 QT Interval:  432 QTC Calculation: 420 R Axis:   -77  Text Interpretation: Sinus bradycardia with 1st degree A-V block Left axis deviation Pulmonary disease pattern When compared with ECG of 16-Jun-2023 10:48, No significant change was found Confirmed by Emelia Hazy [phone]) on 10/10/2024 10:28:13 AM   EKG 06/16/2023 first-degree AV block left axis deviation 70 bpm.  Echocardiogram 09/20/2021  IMPRESSIONS     1. Left ventricular ejection fraction, by estimation, is 60 to 65%. The  left ventricle has normal function. The left ventricle has no regional  wall motion abnormalities. There is mild left ventricular hypertrophy.  Left ventricular diastolic parameters  are consistent with Grade II diastolic dysfunction (pseudonormalization).  There is the interventricular septum is flattened in systole and diastole,  consistent with right ventricular pressure and volume  overload.   2. Right ventricular systolic function is moderately reduced. The right  ventricular size is normal. There is moderately elevated pulmonary artery  systolic pressure.   3. Left atrial size was severely dilated.   4. Right atrial size was moderately dilated.   5. The mitral valve is normal in structure. No evidence of mitral valve  regurgitation. No evidence of mitral stenosis.   6. The aortic valve is tricuspid. There is moderate calcification of the  aortic valve. There is moderate thickening of the aortic valve. Aortic  valve regurgitation is not visualized. Mild to moderate aortic valve  stenosis.   7. The inferior vena cava is dilated in size with <50% respiratory  variability, suggesting right atrial pressure of 15 mmHg.  Assessment & Plan   1. Chronic diastolic CHF-weight today 123.4 pounds.  No lower extremity edema.  No increased DOE or activity intolerance echo 09/20/2021 showed G2 DD and normal LVEF.  Details above. Continue  furosemide , nebivolol , potassium Heart healthy low-sodium diet Increase physical activity as  tolerated Hasn't taken Jaurdiance in 1 year   Coronary artery disease-denies exertional chest discomfort.  STEMI 2008 with BMS to his circumflex at [facility]   Continue aspirin , apixaban , nebivolol , Heart healthy low-sodium diet-reviewed  Maintain physical activity  Essential hypertension-BP today 90/56.   Maintain blood pressure log Continue nebivolol  Heart healthy low-sodium diet-salty 6 given Increase physical activity as tolerated   Hyperlipidemia- 04/28/2024: Cholesterol 74; HDL 32; LDL Cholesterol 26; Triglycerides 78; VLDL 16 Continue rosuvastatin , aspirin  Heart healthy low-sodium high-fiber diet Increase physical activity as tolerated   Atrial fibrillation-EKG today shows sinus bradycardia with first-degree AV block left axis deviation 57 bpm.  He continues to be cardiac unaware.  He reports compliance with apixaban  and denies bleeding  issues. Continue nebivolol , apixaban  Avoid triggers caffeine, chocolate, EtOH, dehydration etc. reviewed Decrease nibivelol to 10 mg daily    Disposition: Follow-up with Dr. Kate or me in 9-12 months.  Josefa HERO. Jorryn Hershberger NP-C    10/10/2024, 10:28 AM [facility] [address] Office [phone] Fax [phone]  Notice: This dictation was prepared with Dragon dictation along with smaller phrase technology. Any transcriptional errors that result from this process are unintentional and may not be corrected upon review.  I spent 14 minutes examining this patient, reviewing medications, and using patient centered shared decision making involving her cardiac care.   I spent greater than 20 minutes reviewing her past medical history,  medications, and prior cardiac tests.

## 2024-10-10 ENCOUNTER — Encounter: Payer: Self-pay | Admitting: General Practice

## 2024-10-10 ENCOUNTER — Ambulatory Visit: Attending: General Practice | Admitting: General Practice

## 2024-10-10 VITALS — BP 90/56 | HR 57 | Ht 67.5 in | Wt 123.4 lb

## 2024-10-10 DIAGNOSIS — I5032 Chronic diastolic (congestive) heart failure: Secondary | ICD-10-CM

## 2024-10-10 DIAGNOSIS — E782 Mixed hyperlipidemia: Secondary | ICD-10-CM

## 2024-10-10 DIAGNOSIS — I10 Essential (primary) hypertension: Secondary | ICD-10-CM | POA: Diagnosis not present

## 2024-10-10 DIAGNOSIS — I2510 Atherosclerotic heart disease of native coronary artery without angina pectoris: Secondary | ICD-10-CM

## 2024-10-10 DIAGNOSIS — I4819 Other persistent atrial fibrillation: Secondary | ICD-10-CM | POA: Diagnosis not present

## 2024-10-10 MED ORDER — NEBIVOLOL HCL 10 MG PO TABS: 10.0000 mg | ORAL_TABLET | Freq: Every day | ORAL | 3 refills | Status: AC

## 2024-10-10 NOTE — Patient Instructions (Addendum)
 Medication Instructions:  Decrease Nebivolol  ( Bystolic ) 10 mg take one tablet daily. May take an extra 5 mg daily as needed for irregular heart rate or palpitations.  *If you need a refill on your cardiac medications before your next appointment, please call your pharmacy*   Follow-Up: At [facility], you and your health needs are our priority.  As part of our continuing mission to provide you with exceptional heart care, our providers are all part of one team.  This team includes your primary Cardiologist (physician) and Advanced Practice Providers or APPs (Physician Assistants and Nurse Practitioners) who all work together to provide you with the care you need, when you need it.  Your next appointment:   9-12 month(s)  Provider:   Lonni LITTIE Nanas, MD or Josefa Beauvais, NP

## 2024-11-01 ENCOUNTER — Ambulatory Visit: Admitting: Internal Medicine

## 2024-11-05 DIAGNOSIS — J449 Chronic obstructive pulmonary disease, unspecified: Secondary | ICD-10-CM | POA: Diagnosis not present

## 2024-11-30 ENCOUNTER — Ambulatory Visit

## 2024-11-30 ENCOUNTER — Other Ambulatory Visit: Payer: Self-pay

## 2024-11-30 ENCOUNTER — Ambulatory Visit (HOSPITAL_COMMUNITY): Admission: RE | Admit: 2024-11-30 | Discharge: 2024-11-30 | Attending: Vascular Surgery

## 2024-11-30 VITALS — BP 99/62 | HR 64 | Temp 97.7°F | Wt 123.2 lb

## 2024-11-30 DIAGNOSIS — I70245 Atherosclerosis of native arteries of left leg with ulceration of other part of foot: Secondary | ICD-10-CM | POA: Insufficient documentation

## 2024-11-30 DIAGNOSIS — I739 Peripheral vascular disease, unspecified: Secondary | ICD-10-CM

## 2024-11-30 LAB — VAS US ABI WITH/WO TBI
Left ABI: 0.72
Right ABI: 0.41

## 2024-11-30 NOTE — H&P (View-Only) (Signed)
 Office Note     CC:  follow up Requesting Provider:  Dettinger, Fonda LABOR, MD  HPI: Luis Salinas is a 78 y.o. (Jul 07, 1946) male who presents for surveillance of left lower extremity bypass.  He is status post left external iliac artery stenting, iliofemoral endarterectomy with vein patch angioplasty, and femoral to below the knee popliteal bypass with vein on 04/26/2024 by Dr. Magda due to critical limb ischemia with interdigital ulceration.  His ulceration healed shortly after surgery.  He denies any new tissue loss or rest pain.  He also denies claudication however he is severely limited by his breathing.  He requires oxygen  24/7.  He continues to smoke cigars on a regular basis.  He is on Eliquis  for atrial fibrillation as well as aspirin  daily.   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 His family found him after 27 years   Arthritis    Cataract    Chronic diastolic CHF (congestive heart failure) (HCC)    COPD (chronic obstructive pulmonary disease) (HCC)    Dyslipidemia    History of concussion    History of STEMI    a. 11/2007 s/p DES to LCX at [facility]   Hypertension    On home oxygen  therapy    4L/Vebl[place]   Paroxysmal atrial fibrillation (HCC)    Pneumonia    exposure to caustic chemicals   Stroke [facility])    multiple strokes   Tobacco abuse    Type 2 diabetes mellitus (HCC)     Past Surgical History:  Procedure Laterality Date   ABDOMINAL AORTOGRAM N/A 04/21/2024   Procedure: ABDOMINAL AORTOGRAM;  Surgeon: Magda Debby SAILOR, MD;  Location: [facility] INVASIVE CV LAB;  Service: Cardiovascular;  Laterality: N/A;   ANTERIOR CERVICAL DECOMP/DISCECTOMY FUSION  05/16/2012   Procedure: ANTERIOR CERVICAL DECOMPRESSION/DISCECTOMY FUSION 2 LEVELS;  Surgeon: Victory Gens, MD;  Location: [facility] NEURO ORS;  Service: Neurosurgery;  Laterality: N/A;  Cervical Five-Six, Cervical Six-Seven Anterior Cervical Decompression/Diskectomy Fusion   CARDIAC  CATHETERIZATION     CARDIOVERSION N/A 11/10/2021   Procedure: CARDIOVERSION;  Surgeon: Delford Maude BROCKS, MD;  Location: [facility] ENDOSCOPY;  Service: Cardiovascular;  Laterality: N/A;   COLONOSCOPY W/ POLYPECTOMY     CORONARY ANGIOPLASTY     ENDARTERECTOMY FEMORAL Left 04/26/2024   Procedure: ENDARTERECTOMY, FEMORAL WITH ANGIOPLASTY;  Surgeon: Magda Debby SAILOR, MD;  Location: [facility] OR;  Service: Vascular;  Laterality: Left;   EYE SURGERY     cataracts   FEMORAL-POPLITEAL BYPASS GRAFT Left 04/26/2024   Procedure: BYPASS GRAFT FEMORAL-POPLITEAL ARTERY WITH LEFT GREATER SAPHENOUS VEIN HARVEST;  Surgeon: Magda Debby SAILOR, MD;  Location: [facility] OR;  Service: Vascular;  Laterality: Left;   INSERTION OF ILIAC STENT Left 04/26/2024   Procedure: INSERTION, EXTERNAL ILIAC STENT, ARTERY;  Surgeon: Magda Debby SAILOR, MD;  Location: [facility] OR;  Service: Vascular;  Laterality: Left;   JOINT REPLACEMENT     Left knee replacement   JOINT REPLACEMENT     left elbow at age 31   LOWER EXTREMITY ANGIOGRAM Left 04/26/2024   Procedure: GERALYN, LOWER EXTREMITY;  Surgeon: Magda Debby SAILOR, MD;  Location: [facility] OR;  Service: Vascular;  Laterality: Left;   LOWER EXTREMITY ANGIOGRAPHY N/A 04/21/2024   Procedure: Lower Extremity Angiography;  Surgeon: Magda Debby SAILOR, MD;  Location: [facility] INVASIVE CV LAB;  Service: Cardiovascular;  Laterality: N/A;   LOWER EXTREMITY INTERVENTION N/A 04/21/2024   Procedure: LOWER EXTREMITY  INTERVENTION;  Surgeon: Magda Debby SAILOR, MD;  Location: [facility] INVASIVE CV LAB;  Service: Cardiovascular;  Laterality: N/A;   ROTATOR CUFF REPAIR     left   TONSILLECTOMY      Social History   Socioeconomic History   Marital status: Married    Spouse name: Grayce   Number of children: 7   Years of education: college   Highest education level: Some college, no degree  Occupational History   Occupation: Retired    Comment: proofreader  Tobacco Use   Smoking status: Light Smoker    Types: Cigars    Last attempt to quit:  04/03/2013    Years since quitting: 11.6   Smokeless tobacco: Never   Tobacco comments:    1-2 cigars per day.  Smoked 40 years cigarettes.  10 years ago quit cigarettes.  Vaping Use   Vaping status: Never Used  Substance and Sexual Activity   Alcohol use: No    Alcohol/week: 0.0 standard drinks of alcohol   Drug use: Yes    Types: Marijuana    Comment: offer he will smoke 01/20/22   Sexual activity: Yes  Other Topics Concern   Not on file  Social History Narrative   Lives with his fourth wife.    Says his family found him in 1992 and he had no recollection of his childhood -believes to have had several head injuries    Social Drivers of Health   Tobacco Use: High Risk (11/30/2024)   Patient History    Smoking Tobacco Use: Light Smoker    Smokeless Tobacco Use: Never    Passive Exposure: Not on file  Financial Resource Strain: Low Risk (06/08/2024)   Overall Financial Resource Strain (CARDIA)    Difficulty of Paying Living Expenses: Not hard at all  Food Insecurity: No Food Insecurity (06/08/2024)   Epic    Worried About Programme Researcher, Broadcasting/film/video in the Last Year: Never true    Ran Out of Food in the Last Year: Never true  Recent Concern: Food Insecurity - Food Insecurity Present (05/02/2024)   Hunger Vital Sign    Worried About Running Out of Food in the Last Year: Sometimes true    Ran Out of Food in the Last Year: Sometimes true  Transportation Needs: No Transportation Needs (06/08/2024)   Epic    Lack of Transportation (Medical): No    Lack of Transportation (Non-Medical): No  Physical Activity: Insufficiently Active (06/08/2024)   Exercise Vital Sign    Days of Exercise per Week: 2 days    Minutes of Exercise per Session: 20 min  Stress: No Stress Concern Present (06/08/2024)   [employer] of Occupational Health - Occupational Stress Questionnaire    Feeling of Stress: Not at all  Social Connections: Socially Integrated (04/28/2024)   Social Connection and Isolation  Panel    Frequency of Communication with Friends and Family: Never    Frequency of Social Gatherings with Friends and Family: More than three times a week    Attends Religious Services: 1 to 4 times per year    Active Member of [employer] or Organizations: Yes    Attends Banker Meetings: Never    Marital Status: Married  Catering Manager Violence: Not At Risk (06/08/2024)   Epic    Fear of Current or Ex-Partner: No    Emotionally Abused: No    Physically Abused: No    Sexually Abused: No  Depression (PHQ2-9): Low Risk (09/04/2024)   Depression (PHQ2-9)  PHQ-2 Score: 0  Alcohol Screen: Low Risk (06/08/2024)   Alcohol Screen    Last Alcohol Screening Score (AUDIT): 0  Housing: Unknown (06/08/2024)   Epic    Unable to Pay for Housing in the Last Year: No    Number of Times Moved in the Last Year: Not on file    Homeless in the Last Year: No  Recent Concern: Housing - High Risk (05/02/2024)   Housing Stability Vital Sign    Unable to Pay for Housing in the Last Year: Yes    Number of Times Moved in the Last Year: 0    Homeless in the Last Year: No  Utilities: Not At Risk (06/08/2024)   Epic    Threatened with loss of utilities: No  Health Literacy: Adequate Health Literacy (06/08/2024)   B1300 Health Literacy    Frequency of need for help with medical instructions: Never    Family History  Problem Relation Age of Onset   Heart disease Mother    COPD Father    Aneurysm Father    Anesthesia problems Neg Hx    Hypotension Neg Hx    Malignant hyperthermia Neg Hx    Pseudochol deficiency Neg Hx     Current Outpatient Medications  Medication Sig Dispense Refill   ACCU-CHEK GUIDE test strip USE TO CHECK SUGAR UP TO 4 TIMES DAILY AS DIRECTED     Accu-Chek Softclix Lancets lancets SMARTSIG:Topical 1-4 Times Daily     acetaminophen  (TYLENOL ) 650 MG CR tablet Take 650-1,300 mg by mouth every 8 (eight) hours as needed for pain.     albuterol  (VENTOLIN  HFA) 108 (90 Base)  MCG/ACT inhaler Inhale 2 puffs into the lungs every 6 (six) hours as needed for wheezing or shortness of breath. 18 each 1   apixaban  (ELIQUIS ) 5 MG TABS tablet Take 1 tablet (5 mg total) by mouth 2 (two) times daily. 180 tablet 3   ASPIRIN  LOW DOSE 81 MG tablet TAKE 1 TABLET BY MOUTH EVERY DAY 90 tablet 3   b complex vitamins capsule Take 1 capsule by mouth daily.     blood glucose meter kit and supplies KIT Dispense based on patient and insurance preference. Use up to four times daily as directed. 1 each 0   Budeson-Glycopyrrol-Formoterol  (BREZTRI  AEROSPHERE) 160-9-4.8 MCG/ACT AERO Inhale 2 puffs into the lungs 2 (two) times daily. (Patient taking differently: Inhale 2 puffs into the lungs 2 (two) times daily as needed.) 10.7 g 11   diphenhydramine-acetaminophen  (TYLENOL  PM) 25-500 MG TABS tablet Take 1-2 tablets by mouth at bedtime as needed (sleep/pain.).     furosemide  (LASIX ) 40 MG tablet Take 1 tablet (40 mg total) by mouth daily. 90 tablet 3   Multiple Vitamins-Minerals (PRESERVISION AREDS 2 PO) Take by mouth.     nebivolol  (BYSTOLIC ) 10 MG tablet Take 1 tablet (10 mg total) by mouth daily. May take an extra 5 mg daily as needed for palpitations 90 tablet 3   nitroGLYCERIN  (NITROSTAT ) 0.4 MG SL tablet Place 1 tablet (0.4 mg total) under the tongue every 5 (five) minutes as needed for chest pain. 50 tablet 3   OXYGEN  Inhale 4 L into the lungs daily.     potassium chloride  SA (KLOR-CON  M) 20 MEQ tablet TAKE 1 TABLET BY MOUTH EVERY DAY 90 tablet 0   rosuvastatin  (CRESTOR ) 40 MG tablet Take 1 tablet (40 mg total) by mouth daily. (Patient taking differently: Take 40 mg by mouth at bedtime.) 90 tablet 3   No  current facility-administered medications for this visit.    Allergies[1]   REVIEW OF SYSTEMS:  Negative unless noted in HPI [X]  denotes positive finding, [ ]  denotes negative finding Cardiac  Comments:  Chest pain or chest pressure:    Shortness of breath upon exertion:    Short of  breath when lying flat:    Irregular heart rhythm:        Vascular    Pain in calf, thigh, or hip brought on by ambulation:    Pain in feet at night that wakes you up from your sleep:     Blood clot in your veins:    Leg swelling:         Pulmonary    Oxygen  at home:    Productive cough:     Wheezing:         Neurologic    Sudden weakness in arms or legs:     Sudden numbness in arms or legs:     Sudden onset of difficulty speaking or slurred speech:    Temporary loss of vision in one eye:     Problems with dizziness:         Gastrointestinal    Blood in stool:     Vomited blood:         Genitourinary    Burning when urinating:     Blood in urine:        Psychiatric    Major depression:         Hematologic    Bleeding problems:    Problems with blood clotting too easily:        Skin    Rashes or ulcers:        Constitutional    Fever or chills:      PHYSICAL EXAMINATION:  Vitals:   11/30/24 1240 11/30/24 1242  BP:  99/62  Pulse:  64  Temp:  97.7 F (36.5 C)  TempSrc: Temporal Temporal  Weight:  123 lb 3.2 oz (55.9 kg)    General:  WDWN in NAD; vital signs documented above Gait: Not observed HENT: WNL, normocephalic Pulmonary: normal non-labored breathing Cardiac: regular HR Abdomen: soft, NT, no masses Skin: without rashes Vascular Exam/Pulses: absent L DP pulse Extremities: without ischemic changes, without Gangrene , without cellulitis; without open wounds;  Musculoskeletal: no muscle wasting or atrophy  Neurologic: A&O X 3 Psychiatric:  The pt has Normal affect.   Non-Invasive Vascular Imaging:   Left lower extremity bypass duplex demonstrates a patent bypass however there are low flow velocities through the bypass with a 443 cm/s velocity at the distal anastomosis  ABI/TBIToday's ABIToday's TBIPrevious ABIPrevious TBI  +-------+-----------+-----------+------------+------------+  Right 0.41       0.00       0.42        0.00           +-------+-----------+-----------+------------+------------+  Left  0.72       0.00       0.90        0.74          +-------+-----------+-----------+------------+------------+         ASSESSMENT/PLAN:: 78 y.o. male here for follow up for surveillance of left leg bypass  Mr. Hopfensperger is a 78 year old male who underwent left iliac stenting, iliofemoral endarterectomy and femoral to below the knee popliteal bypass with vein.  He healed the toe ulcer quickly after surgery.  Fortunately he is without new tissue loss and continues to be without rest pain.  Duplex did demonstrate sluggish flow through the bypass with an elevated velocity at the distal anastomosis which indicates a threatened bypass.  Plan will be to proceed with aortogram with left lower extremity runoff and possible ballooning/stenting of the distal bypass and anastomosis.  The patient would prefer to wait until after the holidays to proceed with angiography.  He will continue his aspirin  perioperatively.  He will have to hold his Eliquis  48 hours prior to angiography.  Case was discussed in detail with the patient and he is agreeable to proceed.   Donnice Sender, PA-C [facility] [phone]  Clinic MD:   [place]     [1] No Known Allergies

## 2024-11-30 NOTE — Progress Notes (Signed)
 Office Note     CC:  follow up Requesting Provider:  Dettinger, Fonda LABOR, MD  HPI: Luis Salinas is a 78 y.o. (Jul 07, 1946) male who presents for surveillance of left lower extremity bypass.  He is status post left external iliac artery stenting, iliofemoral endarterectomy with vein patch angioplasty, and femoral to below the knee popliteal bypass with vein on 04/26/2024 by Dr. Magda due to critical limb ischemia with interdigital ulceration.  His ulceration healed shortly after surgery.  He denies any new tissue loss or rest pain.  He also denies claudication however he is severely limited by his breathing.  He requires oxygen  24/7.  He continues to smoke cigars on a regular basis.  He is on Eliquis  for atrial fibrillation as well as aspirin  daily.   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 His family found him after 27 years   Arthritis    Cataract    Chronic diastolic CHF (congestive heart failure) (HCC)    COPD (chronic obstructive pulmonary disease) (HCC)    Dyslipidemia    History of concussion    History of STEMI    a. 11/2007 s/p DES to LCX at [facility]   Hypertension    On home oxygen  therapy    4L/Veblen   Paroxysmal atrial fibrillation (HCC)    Pneumonia    exposure to caustic chemicals   Stroke [facility])    multiple strokes   Tobacco abuse    Type 2 diabetes mellitus (HCC)     Past Surgical History:  Procedure Laterality Date   ABDOMINAL AORTOGRAM N/A 04/21/2024   Procedure: ABDOMINAL AORTOGRAM;  Surgeon: Magda Debby SAILOR, MD;  Location: [facility] INVASIVE CV LAB;  Service: Cardiovascular;  Laterality: N/A;   ANTERIOR CERVICAL DECOMP/DISCECTOMY FUSION  05/16/2012   Procedure: ANTERIOR CERVICAL DECOMPRESSION/DISCECTOMY FUSION 2 LEVELS;  Surgeon: Victory Gens, MD;  Location: [facility] NEURO ORS;  Service: Neurosurgery;  Laterality: N/A;  Cervical Five-Six, Cervical Six-Seven Anterior Cervical Decompression/Diskectomy Fusion   CARDIAC  CATHETERIZATION     CARDIOVERSION N/A 11/10/2021   Procedure: CARDIOVERSION;  Surgeon: Delford Maude BROCKS, MD;  Location: [facility] ENDOSCOPY;  Service: Cardiovascular;  Laterality: N/A;   COLONOSCOPY W/ POLYPECTOMY     CORONARY ANGIOPLASTY     ENDARTERECTOMY FEMORAL Left 04/26/2024   Procedure: ENDARTERECTOMY, FEMORAL WITH ANGIOPLASTY;  Surgeon: Magda Debby SAILOR, MD;  Location: [facility] OR;  Service: Vascular;  Laterality: Left;   EYE SURGERY     cataracts   FEMORAL-POPLITEAL BYPASS GRAFT Left 04/26/2024   Procedure: BYPASS GRAFT FEMORAL-POPLITEAL ARTERY WITH LEFT GREATER SAPHENOUS VEIN HARVEST;  Surgeon: Magda Debby SAILOR, MD;  Location: [facility] OR;  Service: Vascular;  Laterality: Left;   INSERTION OF ILIAC STENT Left 04/26/2024   Procedure: INSERTION, EXTERNAL ILIAC STENT, ARTERY;  Surgeon: Magda Debby SAILOR, MD;  Location: [facility] OR;  Service: Vascular;  Laterality: Left;   JOINT REPLACEMENT     Left knee replacement   JOINT REPLACEMENT     left elbow at age 31   LOWER EXTREMITY ANGIOGRAM Left 04/26/2024   Procedure: GERALYN, LOWER EXTREMITY;  Surgeon: Magda Debby SAILOR, MD;  Location: [facility] OR;  Service: Vascular;  Laterality: Left;   LOWER EXTREMITY ANGIOGRAPHY N/A 04/21/2024   Procedure: Lower Extremity Angiography;  Surgeon: Magda Debby SAILOR, MD;  Location: [facility] INVASIVE CV LAB;  Service: Cardiovascular;  Laterality: N/A;   LOWER EXTREMITY INTERVENTION N/A 04/21/2024   Procedure: LOWER EXTREMITY  INTERVENTION;  Surgeon: Magda Debby SAILOR, MD;  Location: [facility] INVASIVE CV LAB;  Service: Cardiovascular;  Laterality: N/A;   ROTATOR CUFF REPAIR     left   TONSILLECTOMY      Social History   Socioeconomic History   Marital status: Married    Spouse name: Grayce   Number of children: 7   Years of education: college   Highest education level: Some college, no degree  Occupational History   Occupation: Retired    Comment: proofreader  Tobacco Use   Smoking status: Light Smoker    Types: Cigars    Last attempt to quit:  04/03/2013    Years since quitting: 11.6   Smokeless tobacco: Never   Tobacco comments:    1-2 cigars per day.  Smoked 40 years cigarettes.  10 years ago quit cigarettes.  Vaping Use   Vaping status: Never Used  Substance and Sexual Activity   Alcohol use: No    Alcohol/week: 0.0 standard drinks of alcohol   Drug use: Yes    Types: Marijuana    Comment: offer he will smoke 01/20/22   Sexual activity: Yes  Other Topics Concern   Not on file  Social History Narrative   Lives with his fourth wife.    Says his family found him in 1992 and he had no recollection of his childhood -believes to have had several head injuries    Social Drivers of Health   Tobacco Use: High Risk (11/30/2024)   Patient History    Smoking Tobacco Use: Light Smoker    Smokeless Tobacco Use: Never    Passive Exposure: Not on file  Financial Resource Strain: Low Risk (06/08/2024)   Overall Financial Resource Strain (CARDIA)    Difficulty of Paying Living Expenses: Not hard at all  Food Insecurity: No Food Insecurity (06/08/2024)   Epic    Worried About Programme Researcher, Broadcasting/film/video in the Last Year: Never true    Ran Out of Food in the Last Year: Never true  Recent Concern: Food Insecurity - Food Insecurity Present (05/02/2024)   Hunger Vital Sign    Worried About Running Out of Food in the Last Year: Sometimes true    Ran Out of Food in the Last Year: Sometimes true  Transportation Needs: No Transportation Needs (06/08/2024)   Epic    Lack of Transportation (Medical): No    Lack of Transportation (Non-Medical): No  Physical Activity: Insufficiently Active (06/08/2024)   Exercise Vital Sign    Days of Exercise per Week: 2 days    Minutes of Exercise per Session: 20 min  Stress: No Stress Concern Present (06/08/2024)   [employer] of Occupational Health - Occupational Stress Questionnaire    Feeling of Stress: Not at all  Social Connections: Socially Integrated (04/28/2024)   Social Connection and Isolation  Panel    Frequency of Communication with Friends and Family: Never    Frequency of Social Gatherings with Friends and Family: More than three times a week    Attends Religious Services: 1 to 4 times per year    Active Member of [employer] or Organizations: Yes    Attends Banker Meetings: Never    Marital Status: Married  Catering Manager Violence: Not At Risk (06/08/2024)   Epic    Fear of Current or Ex-Partner: No    Emotionally Abused: No    Physically Abused: No    Sexually Abused: No  Depression (PHQ2-9): Low Risk (09/04/2024)   Depression (PHQ2-9)  PHQ-2 Score: 0  Alcohol Screen: Low Risk (06/08/2024)   Alcohol Screen    Last Alcohol Screening Score (AUDIT): 0  Housing: Unknown (06/08/2024)   Epic    Unable to Pay for Housing in the Last Year: No    Number of Times Moved in the Last Year: Not on file    Homeless in the Last Year: No  Recent Concern: Housing - High Risk (05/02/2024)   Housing Stability Vital Sign    Unable to Pay for Housing in the Last Year: Yes    Number of Times Moved in the Last Year: 0    Homeless in the Last Year: No  Utilities: Not At Risk (06/08/2024)   Epic    Threatened with loss of utilities: No  Health Literacy: Adequate Health Literacy (06/08/2024)   B1300 Health Literacy    Frequency of need for help with medical instructions: Never    Family History  Problem Relation Age of Onset   Heart disease Mother    COPD Father    Aneurysm Father    Anesthesia problems Neg Hx    Hypotension Neg Hx    Malignant hyperthermia Neg Hx    Pseudochol deficiency Neg Hx     Current Outpatient Medications  Medication Sig Dispense Refill   ACCU-CHEK GUIDE test strip USE TO CHECK SUGAR UP TO 4 TIMES DAILY AS DIRECTED     Accu-Chek Softclix Lancets lancets SMARTSIG:Topical 1-4 Times Daily     acetaminophen  (TYLENOL ) 650 MG CR tablet Take 650-1,300 mg by mouth every 8 (eight) hours as needed for pain.     albuterol  (VENTOLIN  HFA) 108 (90 Base)  MCG/ACT inhaler Inhale 2 puffs into the lungs every 6 (six) hours as needed for wheezing or shortness of breath. 18 each 1   apixaban  (ELIQUIS ) 5 MG TABS tablet Take 1 tablet (5 mg total) by mouth 2 (two) times daily. 180 tablet 3   ASPIRIN  LOW DOSE 81 MG tablet TAKE 1 TABLET BY MOUTH EVERY DAY 90 tablet 3   b complex vitamins capsule Take 1 capsule by mouth daily.     blood glucose meter kit and supplies KIT Dispense based on patient and insurance preference. Use up to four times daily as directed. 1 each 0   Budeson-Glycopyrrol-Formoterol  (BREZTRI  AEROSPHERE) 160-9-4.8 MCG/ACT AERO Inhale 2 puffs into the lungs 2 (two) times daily. (Patient taking differently: Inhale 2 puffs into the lungs 2 (two) times daily as needed.) 10.7 g 11   diphenhydramine-acetaminophen  (TYLENOL  PM) 25-500 MG TABS tablet Take 1-2 tablets by mouth at bedtime as needed (sleep/pain.).     furosemide  (LASIX ) 40 MG tablet Take 1 tablet (40 mg total) by mouth daily. 90 tablet 3   Multiple Vitamins-Minerals (PRESERVISION AREDS 2 PO) Take by mouth.     nebivolol  (BYSTOLIC ) 10 MG tablet Take 1 tablet (10 mg total) by mouth daily. May take an extra 5 mg daily as needed for palpitations 90 tablet 3   nitroGLYCERIN  (NITROSTAT ) 0.4 MG SL tablet Place 1 tablet (0.4 mg total) under the tongue every 5 (five) minutes as needed for chest pain. 50 tablet 3   OXYGEN  Inhale 4 L into the lungs daily.     potassium chloride  SA (KLOR-CON  M) 20 MEQ tablet TAKE 1 TABLET BY MOUTH EVERY DAY 90 tablet 0   rosuvastatin  (CRESTOR ) 40 MG tablet Take 1 tablet (40 mg total) by mouth daily. (Patient taking differently: Take 40 mg by mouth at bedtime.) 90 tablet 3   No  current facility-administered medications for this visit.    Allergies[1]   REVIEW OF SYSTEMS:  Negative unless noted in HPI [X]  denotes positive finding, [ ]  denotes negative finding Cardiac  Comments:  Chest pain or chest pressure:    Shortness of breath upon exertion:    Short of  breath when lying flat:    Irregular heart rhythm:        Vascular    Pain in calf, thigh, or hip brought on by ambulation:    Pain in feet at night that wakes you up from your sleep:     Blood clot in your veins:    Leg swelling:         Pulmonary    Oxygen  at home:    Productive cough:     Wheezing:         Neurologic    Sudden weakness in arms or legs:     Sudden numbness in arms or legs:     Sudden onset of difficulty speaking or slurred speech:    Temporary loss of vision in one eye:     Problems with dizziness:         Gastrointestinal    Blood in stool:     Vomited blood:         Genitourinary    Burning when urinating:     Blood in urine:        Psychiatric    Major depression:         Hematologic    Bleeding problems:    Problems with blood clotting too easily:        Skin    Rashes or ulcers:        Constitutional    Fever or chills:      PHYSICAL EXAMINATION:  Vitals:   11/30/24 1240 11/30/24 1242  BP:  99/62  Pulse:  64  Temp:  97.7 F (36.5 C)  TempSrc: Temporal Temporal  Weight:  123 lb 3.2 oz (55.9 kg)    General:  WDWN in NAD; vital signs documented above Gait: Not observed HENT: WNL, normocephalic Pulmonary: normal non-labored breathing Cardiac: regular HR Abdomen: soft, NT, no masses Skin: without rashes Vascular Exam/Pulses: absent L DP pulse Extremities: without ischemic changes, without Gangrene , without cellulitis; without open wounds;  Musculoskeletal: no muscle wasting or atrophy  Neurologic: A&O X 3 Psychiatric:  The pt has Normal affect.   Non-Invasive Vascular Imaging:   Left lower extremity bypass duplex demonstrates a patent bypass however there are low flow velocities through the bypass with a 443 cm/s velocity at the distal anastomosis  ABI/TBIToday's ABIToday's TBIPrevious ABIPrevious TBI  +-------+-----------+-----------+------------+------------+  Right 0.41       0.00       0.42        0.00           +-------+-----------+-----------+------------+------------+  Left  0.72       0.00       0.90        0.74          +-------+-----------+-----------+------------+------------+         ASSESSMENT/PLAN:: 78 y.o. male here for follow up for surveillance of left leg bypass  Mr. Hopfensperger is a 78 year old male who underwent left iliac stenting, iliofemoral endarterectomy and femoral to below the knee popliteal bypass with vein.  He healed the toe ulcer quickly after surgery.  Fortunately he is without new tissue loss and continues to be without rest pain.  Duplex did demonstrate sluggish flow through the bypass with an elevated velocity at the distal anastomosis which indicates a threatened bypass.  Plan will be to proceed with aortogram with left lower extremity runoff and possible ballooning/stenting of the distal bypass and anastomosis.  The patient would prefer to wait until after the holidays to proceed with angiography.  He will continue his aspirin  perioperatively.  He will have to hold his Eliquis  48 hours prior to angiography.  Case was discussed in detail with the patient and he is agreeable to proceed.   Donnice Sender, PA-C [facility] [phone]  Clinic MD:   [place]     [1] No Known Allergies

## 2024-12-05 ENCOUNTER — Ambulatory Visit

## 2024-12-05 ENCOUNTER — Encounter (HOSPITAL_COMMUNITY)

## 2024-12-05 ENCOUNTER — Other Ambulatory Visit (HOSPITAL_COMMUNITY)

## 2024-12-05 DIAGNOSIS — J449 Chronic obstructive pulmonary disease, unspecified: Secondary | ICD-10-CM | POA: Diagnosis not present

## 2024-12-11 ENCOUNTER — Ambulatory Visit: Payer: Self-pay | Admitting: Family Medicine

## 2024-12-11 ENCOUNTER — Encounter: Payer: Self-pay | Admitting: Family Medicine

## 2024-12-11 VITALS — BP 115/68 | HR 87 | Ht 67.5 in | Wt 124.0 lb

## 2024-12-11 DIAGNOSIS — E1159 Type 2 diabetes mellitus with other circulatory complications: Secondary | ICD-10-CM | POA: Diagnosis not present

## 2024-12-11 DIAGNOSIS — J449 Chronic obstructive pulmonary disease, unspecified: Secondary | ICD-10-CM

## 2024-12-11 DIAGNOSIS — I152 Hypertension secondary to endocrine disorders: Secondary | ICD-10-CM | POA: Diagnosis not present

## 2024-12-11 DIAGNOSIS — I5043 Acute on chronic combined systolic (congestive) and diastolic (congestive) heart failure: Secondary | ICD-10-CM

## 2024-12-11 DIAGNOSIS — E785 Hyperlipidemia, unspecified: Secondary | ICD-10-CM | POA: Diagnosis not present

## 2024-12-11 DIAGNOSIS — E782 Mixed hyperlipidemia: Secondary | ICD-10-CM

## 2024-12-11 DIAGNOSIS — J411 Mucopurulent chronic bronchitis: Secondary | ICD-10-CM

## 2024-12-11 DIAGNOSIS — E1169 Type 2 diabetes mellitus with other specified complication: Secondary | ICD-10-CM | POA: Diagnosis not present

## 2024-12-11 LAB — BAYER DCA HB A1C WAIVED: HB A1C (BAYER DCA - WAIVED): 5.7 % — ABNORMAL HIGH (ref 4.8–5.6)

## 2024-12-11 LAB — LIPID PANEL

## 2024-12-11 MED ORDER — FUROSEMIDE 40 MG PO TABS: 40.0000 mg | ORAL_TABLET | Freq: Every day | ORAL | 3 refills | Status: AC

## 2024-12-11 MED ORDER — BREZTRI AEROSPHERE 160-9-4.8 MCG/ACT IN AERO: 2.0000 | INHALATION_SPRAY | Freq: Two times a day (BID) | RESPIRATORY_TRACT | 11 refills | Status: AC

## 2024-12-11 MED ORDER — ROSUVASTATIN CALCIUM 40 MG PO TABS: 40.0000 mg | ORAL_TABLET | Freq: Every day | ORAL | 3 refills | Status: AC

## 2024-12-11 NOTE — Progress Notes (Signed)
 "  BP 115/68   Pulse 87   Ht 5' 7.5 (1.715 m)   Wt 124 lb (56.2 kg)   SpO2 96%   BMI 19.13 kg/m    Subjective:   [name], male    DOB: 1946-04-15, 78 y.o.   MRN: [id number]  HPI: Luis Salinas is a 78 y.o. male presenting on 12/11/2024 for Medical Management of Chronic Issues, Diabetes, and Hypertension   Discussed the use of AI scribe software for clinical note transcription with the patient, who gave verbal consent to proceed.  History of Present Illness   Luis Salinas is a 78 year old male with diabetes who presents for a recheck of his condition.  Glycemic control - Diabetes with stable blood glucose levels - Recent HbA1c of 5.7 - No issues with blood sugar control  Respiratory symptoms - Uses Breztri  inhaler for chronic respiratory condition - No significant improvement in respiratory symptoms with Breztri  - Infrequent use of albuterol  rescue inhaler - No wheezing - Previous consultation with pulmonologist; further recommended tests not pursued  Blood pressure management - Currently on Bystolic  10 mg for blood pressure control - Previously discontinued Bystolic  5 mg dose - Also taking a blood thinner  Tobacco use - Current smoker - Smoking negatively impacts blood flow and overall health          Relevant past medical, surgical, family and social history reviewed and updated as indicated. Interim medical history since our last visit reviewed. Allergies and medications reviewed and updated.  Review of Systems  Constitutional:  Negative for chills and fever.  Eyes:  Negative for visual disturbance.  Respiratory:  Negative for shortness of breath and wheezing.   Cardiovascular:  Negative for chest pain and leg swelling.  Musculoskeletal:  Negative for back pain and gait problem.  Skin:  Negative for rash.  Neurological:  Negative for dizziness and light-headedness.  All other systems reviewed and are negative.   Per HPI  unless specifically indicated above   Allergies as of 12/11/2024   No Known Allergies      Medication List        Accurate as of December 11, 2024  2:48 PM. If you have any questions, ask your nurse or doctor.          Accu-Chek Guide test strip Generic drug: glucose blood USE TO CHECK SUGAR UP TO 4 TIMES DAILY AS DIRECTED   Accu-Chek Softclix Lancets lancets SMARTSIG:Topical 1-4 Times Daily   acetaminophen  650 MG CR tablet Commonly known as: TYLENOL  Take 650-1,300 mg by mouth every 8 (eight) hours as needed for pain.   albuterol  108 (90 Base) MCG/ACT inhaler Commonly known as: VENTOLIN  HFA Inhale 2 puffs into the lungs every 6 (six) hours as needed for wheezing or shortness of breath.   apixaban  5 MG Tabs tablet Commonly known as: Eliquis  Take 1 tablet (5 mg total) by mouth 2 (two) times daily.   Aspirin  Low Dose 81 MG tablet Generic drug: aspirin  EC TAKE 1 TABLET BY MOUTH EVERY DAY   b complex vitamins capsule Take 1 capsule by mouth daily.   blood glucose meter kit and supplies Kit Dispense based on patient and insurance preference. Use up to four times daily as directed.   Breztri  Aerosphere 160-9-4.8 MCG/ACT Aero inhaler Generic drug: budesonide -glycopyrrolate-formoterol  Inhale 2 puffs into the lungs 2 (two) times daily. What changed:  when to take this reasons to take this   diphenhydramine-acetaminophen  25-500 MG Tabs tablet  Commonly known as: TYLENOL  PM Take 1-2 tablets by mouth at bedtime as needed (sleep/pain.).   furosemide  40 MG tablet Commonly known as: LASIX  Take 1 tablet (40 mg total) by mouth daily.   nebivolol  10 MG tablet Commonly known as: BYSTOLIC  Take 1 tablet (10 mg total) by mouth daily. May take an extra 5 mg daily as needed for palpitations   nitroGLYCERIN  0.4 MG SL tablet Commonly known as: NITROSTAT  Place 1 tablet (0.4 mg total) under the tongue every 5 (five) minutes as needed for chest pain.   OXYGEN  Inhale 4 L into  the lungs daily.   potassium chloride  SA 20 MEQ tablet Commonly known as: KLOR-CON  M TAKE 1 TABLET BY MOUTH EVERY DAY   PRESERVISION AREDS 2 PO Take by mouth.   rosuvastatin  40 MG tablet Commonly known as: CRESTOR  Take 1 tablet (40 mg total) by mouth daily. What changed: when to take this         Objective:   BP 115/68   Pulse 87   Ht 5' 7.5 (1.715 m)   Wt 124 lb (56.2 kg)   SpO2 96%   BMI 19.13 kg/m   Wt Readings from Last 3 Encounters:  12/11/24 124 lb (56.2 kg)  11/30/24 123 lb 3.2 oz (55.9 kg)  10/10/24 123 lb 6.4 oz (56 kg)    Physical Exam Vitals and nursing note reviewed.  Constitutional:      General: He is not in acute distress.    Appearance: He is well-developed. He is not diaphoretic.  Eyes:     General: No scleral icterus.    Conjunctiva/sclera: Conjunctivae normal.  Neck:     Thyroid : No thyromegaly.  Cardiovascular:     Rate and Rhythm: Normal rate and regular rhythm.     Heart sounds: Normal heart sounds. No murmur heard. Pulmonary:     Effort: Pulmonary effort is normal. No respiratory distress.     Breath sounds: Normal breath sounds. No wheezing.  Musculoskeletal:        General: No swelling. Normal range of motion.     Cervical back: Neck supple.  Lymphadenopathy:     Cervical: No cervical adenopathy.  Skin:    General: Skin is warm and dry.     Findings: No rash.  Neurological:     Mental Status: He is alert and oriented to person, place, and time.     Coordination: Coordination normal.  Psychiatric:        Behavior: Behavior normal.    Physical Exam   VITALS: BP- 115/68 CHEST: Lungs clear to auscultation, no wheezing. CARDIOVASCULAR: Regular heart sounds. EXTREMITIES: No swelling, good pulses in extremities.         Assessment & Plan:   Problem List Items Addressed This Visit       Cardiovascular and Mediastinum   Hypertension associated with diabetes (HCC)   Relevant Medications   furosemide  (LASIX ) 40 MG tablet    rosuvastatin  (CRESTOR ) 40 MG tablet     Respiratory   COPD GOLD ?  AB   Relevant Medications   budesonide -glycopyrrolate-formoterol  (BREZTRI  AEROSPHERE) 160-9-4.8 MCG/ACT AERO inhaler     Endocrine   Type 2 diabetes mellitus (HCC) - Primary   Relevant Medications   rosuvastatin  (CRESTOR ) 40 MG tablet   Other Relevant Orders   Microalbumin / creatinine urine ratio   CBC with Differential/Platelet   CMP14+EGFR   Lipid panel   TSH   PSA, total and free   Bayer DCA Hb A1c Waived  Other   Dyslipidemia   Relevant Medications   rosuvastatin  (CRESTOR ) 40 MG tablet   Other Visit Diagnoses       Mixed hyperlipidemia       Relevant Medications   furosemide  (LASIX ) 40 MG tablet   rosuvastatin  (CRESTOR ) 40 MG tablet   Other Relevant Orders   CBC with Differential/Platelet   CMP14+EGFR   Lipid panel   TSH   PSA, total and free   Bayer DCA Hb A1c Waived     Mucopurulent chronic bronchitis (HCC)       Relevant Medications   budesonide -glycopyrrolate-formoterol  (BREZTRI  AEROSPHERE) 160-9-4.8 MCG/ACT AERO inhaler     Acute on chronic combined systolic and diastolic congestive heart failure (HCC)       Relevant Medications   furosemide  (LASIX ) 40 MG tablet   rosuvastatin  (CRESTOR ) 40 MG tablet          Type 2 diabetes mellitus with circulatory complications Blood sugar levels well-controlled, HbA1c 5.7%. Circulatory complications managed with blood thinners. - Continue current diabetes management regimen. - Continue blood thinners.  Chronic obstructive pulmonary disease Using Breztri  inhaler with no significant improvement. Lungs clear, no wheezing. Recommended follow-up with lung specialist. - Continue Breztri  inhaler. - Follow up with lung specialist.  Peripheral vascular disease with planned stent placement Scheduled for outpatient stent placement to improve circulation. Blood thinners to be paused before surgery. - Pause blood thinners three days prior to  surgery. - Proceed with stent placement surgery.          Follow up plan: Return in about 3 months (around 03/11/2025), or if symptoms worsen or fail to improve, for Diabetes and COPD recheck.  Counseling provided for all of the vaccine components Orders Placed This Encounter  Procedures   Microalbumin / creatinine urine ratio   CBC with Differential/Platelet   CMP14+EGFR   Lipid panel   TSH   PSA, total and free   Bayer DCA Hb A1c Waived    Fonda Levins, MD [facility] 12/11/2024, 2:48 PM     "

## 2024-12-12 LAB — CBC WITH DIFFERENTIAL/PLATELET
Basophils Absolute: 0.1 x10E3/uL (ref 0.0–0.2)
Basos: 1 %
EOS (ABSOLUTE): 0.7 x10E3/uL — ABNORMAL HIGH (ref 0.0–0.4)
Eos: 6 %
Hematocrit: 44.4 % (ref 37.5–51.0)
Hemoglobin: 14.1 g/dL (ref 13.0–17.7)
Immature Grans (Abs): 0 x10E3/uL (ref 0.0–0.1)
Immature Granulocytes: 0 %
Lymphocytes Absolute: 2.5 x10E3/uL (ref 0.7–3.1)
Lymphs: 24 %
MCH: 29.4 pg (ref 26.6–33.0)
MCHC: 31.8 g/dL (ref 31.5–35.7)
MCV: 93 fL (ref 79–97)
Monocytes Absolute: 0.7 x10E3/uL (ref 0.1–0.9)
Monocytes: 6 %
Neutrophils Absolute: 6.4 x10E3/uL (ref 1.4–7.0)
Neutrophils: 63 %
Platelets: 180 x10E3/uL (ref 150–450)
RBC: 4.8 x10E6/uL (ref 4.14–5.80)
RDW: 12.7 % (ref 11.6–15.4)
WBC: 10.4 x10E3/uL (ref 3.4–10.8)

## 2024-12-12 LAB — CMP14+EGFR
ALT: 25 IU/L (ref 0–44)
AST: 25 IU/L (ref 0–40)
Albumin: 4.3 g/dL (ref 3.8–4.8)
Alkaline Phosphatase: 115 IU/L (ref 47–123)
BUN/Creatinine Ratio: 17 (ref 10–24)
BUN: 18 mg/dL (ref 8–27)
Bilirubin Total: 0.2 mg/dL (ref 0.0–1.2)
CO2: 24 mmol/L (ref 20–29)
Calcium: 9.8 mg/dL (ref 8.6–10.2)
Chloride: 103 mmol/L (ref 96–106)
Creatinine, Ser: 1.09 mg/dL (ref 0.76–1.27)
Globulin, Total: 2.7 g/dL (ref 1.5–4.5)
Glucose: 93 mg/dL (ref 70–99)
Potassium: 4.2 mmol/L (ref 3.5–5.2)
Sodium: 144 mmol/L (ref 134–144)
Total Protein: 7 g/dL (ref 6.0–8.5)
eGFR: 69 mL/min/1.73

## 2024-12-12 LAB — LIPID PANEL
Cholesterol, Total: 118 mg/dL (ref 100–199)
HDL: 49 mg/dL
LDL CALC COMMENT:: 2.4 ratio (ref 0.0–5.0)
LDL Chol Calc (NIH): 47 mg/dL (ref 0–99)
Triglycerides: 127 mg/dL (ref 0–149)
VLDL Cholesterol Cal: 22 mg/dL (ref 5–40)

## 2024-12-12 LAB — PSA, TOTAL AND FREE
PSA, Free Pct: 40 %
PSA, Free: 0.36 ng/mL
Prostate Specific Ag, Serum: 0.9 ng/mL (ref 0.0–4.0)

## 2024-12-12 LAB — TSH: TSH: 2.44 u[IU]/mL (ref 0.450–4.500)

## 2024-12-22 ENCOUNTER — Encounter (HOSPITAL_COMMUNITY)
Admission: RE | Disposition: A | Discharge status: Home / Self Care | Payer: Self-pay | Source: Home / Self Care | Attending: Vascular Surgery

## 2024-12-22 ENCOUNTER — Other Ambulatory Visit: Payer: Self-pay

## 2024-12-22 ENCOUNTER — Ambulatory Visit (HOSPITAL_COMMUNITY)
Admission: RE | Admit: 2024-12-22 | Discharge: 2024-12-22 | Disposition: A | Discharge status: Home / Self Care | Attending: Vascular Surgery | Admitting: Vascular Surgery

## 2024-12-22 ENCOUNTER — Ambulatory Visit: Payer: Self-pay | Admitting: Family Medicine

## 2024-12-22 DIAGNOSIS — Z7982 Long term (current) use of aspirin: Secondary | ICD-10-CM | POA: Insufficient documentation

## 2024-12-22 DIAGNOSIS — F17290 Nicotine dependence, other tobacco product, uncomplicated: Secondary | ICD-10-CM | POA: Diagnosis not present

## 2024-12-22 DIAGNOSIS — Z7901 Long term (current) use of anticoagulants: Secondary | ICD-10-CM | POA: Diagnosis not present

## 2024-12-22 DIAGNOSIS — T82858A Stenosis of vascular prosthetic devices, implants and grafts, initial encounter: Secondary | ICD-10-CM | POA: Insufficient documentation

## 2024-12-22 DIAGNOSIS — I871 Compression of vein: Secondary | ICD-10-CM | POA: Diagnosis not present

## 2024-12-22 DIAGNOSIS — Y832 Surgical operation with anastomosis, bypass or graft as the cause of abnormal reaction of the patient, or of later complication, without mention of misadventure at the time of the procedure: Secondary | ICD-10-CM | POA: Diagnosis not present

## 2024-12-22 DIAGNOSIS — I70245 Atherosclerosis of native arteries of left leg with ulceration of other part of foot: Secondary | ICD-10-CM

## 2024-12-22 DIAGNOSIS — J449 Chronic obstructive pulmonary disease, unspecified: Secondary | ICD-10-CM | POA: Insufficient documentation

## 2024-12-22 DIAGNOSIS — I480 Paroxysmal atrial fibrillation: Secondary | ICD-10-CM | POA: Insufficient documentation

## 2024-12-22 DIAGNOSIS — Z95828 Presence of other vascular implants and grafts: Secondary | ICD-10-CM | POA: Diagnosis not present

## 2024-12-22 DIAGNOSIS — I5032 Chronic diastolic (congestive) heart failure: Secondary | ICD-10-CM | POA: Insufficient documentation

## 2024-12-22 DIAGNOSIS — Z9981 Dependence on supplemental oxygen: Secondary | ICD-10-CM | POA: Diagnosis not present

## 2024-12-22 HISTORY — PX: PERIPHERAL VASCULAR ULTRASOUND/IVUS: CATH118334

## 2024-12-22 HISTORY — PX: ABDOMINAL AORTOGRAM W/LOWER EXTREMITY: CATH118223

## 2024-12-22 HISTORY — PX: LOWER EXTREMITY INTERVENTION: CATH118252

## 2024-12-22 LAB — POCT I-STAT, CHEM 8
BUN: 20 mg/dL (ref 8–23)
Calcium, Ion: 1.17 mmol/L (ref 1.15–1.40)
Chloride: 102 mmol/L (ref 98–111)
Creatinine, Ser: 1.1 mg/dL (ref 0.61–1.24)
Glucose, Bld: 107 mg/dL — ABNORMAL HIGH (ref 70–99)
HCT: 44 % (ref 39.0–52.0)
Hemoglobin: 15 g/dL (ref 13.0–17.0)
Potassium: 4.4 mmol/L (ref 3.5–5.1)
Sodium: 141 mmol/L (ref 135–145)
TCO2: 30 mmol/L (ref 22–32)

## 2024-12-22 LAB — POCT ACTIVATED CLOTTING TIME
Activated Clotting Time: 199 s
Activated Clotting Time: 173 s

## 2024-12-22 LAB — GLUCOSE, CAPILLARY: Glucose-Capillary: 100 mg/dL — ABNORMAL HIGH (ref 70–99)

## 2024-12-22 MED ORDER — MIDAZOLAM HCL 2 MG/2ML IJ SOLN
INTRAMUSCULAR | Status: AC
  Filled 2024-12-22: qty 2

## 2024-12-22 MED ORDER — HEPARIN SODIUM (PORCINE) 1000 UNIT/ML IJ SOLN
INTRAMUSCULAR | Status: DC | PRN
  Administered 2024-12-22: 6000 [IU] via INTRAVENOUS

## 2024-12-22 MED ORDER — LIDOCAINE HCL (PF) 1 % IJ SOLN
INTRAMUSCULAR | Status: AC
  Filled 2024-12-22: qty 30

## 2024-12-22 MED ORDER — IODIXANOL 320 MG/ML IV SOLN
INTRAVENOUS | Status: DC | PRN
  Administered 2024-12-22: 70 mL

## 2024-12-22 MED ORDER — SODIUM CHLORIDE 0.9 % WEIGHT BASED INFUSION: 1.0000 mL/kg/h | INTRAVENOUS | Status: DC

## 2024-12-22 MED ORDER — HYDRALAZINE HCL 20 MG/ML IJ SOLN: 5.0000 mg | INTRAMUSCULAR | Status: DC | PRN

## 2024-12-22 MED ORDER — SODIUM CHLORIDE 0.9 % IV SOLN: INTRAVENOUS | Status: DC

## 2024-12-22 MED ORDER — ONDANSETRON HCL 4 MG/2ML IJ SOLN: 4.0000 mg | Freq: Four times a day (QID) | INTRAMUSCULAR | Status: DC | PRN

## 2024-12-22 MED ORDER — SODIUM CHLORIDE 0.9 % IV SOLN: 250.0000 mL | INTRAVENOUS | Status: DC | PRN

## 2024-12-22 MED ORDER — FENTANYL CITRATE (PF) 100 MCG/2ML IJ SOLN
INTRAMUSCULAR | Status: AC
  Filled 2024-12-22: qty 2

## 2024-12-22 MED ORDER — LABETALOL HCL 5 MG/ML IV SOLN: 10.0000 mg | INTRAVENOUS | Status: DC | PRN

## 2024-12-22 MED ORDER — FENTANYL CITRATE (PF) 100 MCG/2ML IJ SOLN
INTRAMUSCULAR | Status: DC | PRN
  Administered 2024-12-22: 25 ug via INTRAVENOUS

## 2024-12-22 MED ORDER — ACETAMINOPHEN 325 MG PO TABS
650.0000 mg | ORAL_TABLET | ORAL | Status: DC | PRN
  Administered 2024-12-22: 650 mg via ORAL

## 2024-12-22 MED ORDER — SODIUM CHLORIDE 0.9% FLUSH: 3.0000 mL | INTRAVENOUS | Status: DC | PRN

## 2024-12-22 MED ORDER — HEPARIN (PORCINE) IN NACL 1000-0.9 UT/500ML-% IV SOLN
INTRAVENOUS | Status: DC | PRN
  Administered 2024-12-22 (×2): 500 mL

## 2024-12-22 MED ORDER — MIDAZOLAM HCL (PF) 2 MG/2ML IJ SOLN
INTRAMUSCULAR | Status: DC | PRN
  Administered 2024-12-22: 1 mg via INTRAVENOUS

## 2024-12-22 MED ORDER — LIDOCAINE HCL (PF) 1 % IJ SOLN
INTRAMUSCULAR | Status: DC | PRN
  Administered 2024-12-22: 10 mL

## 2024-12-22 MED ORDER — HEPARIN SODIUM (PORCINE) 1000 UNIT/ML IJ SOLN
INTRAMUSCULAR | Status: AC
  Filled 2024-12-22: qty 10

## 2024-12-22 MED ORDER — SODIUM CHLORIDE 0.9% FLUSH: 3.0000 mL | Freq: Two times a day (BID) | INTRAVENOUS | Status: DC

## 2024-12-22 NOTE — Progress Notes (Signed)
 Patient and patient wife given discharge instructions, education provided no further questions at this time. Patient able to ambulate and void before discharge. Able to tolerate PO intake. Patient site is clean, dry, intact with no hematoma noted upon discharge. Verified with MD parker when to resume blood thinners.

## 2024-12-22 NOTE — Interval H&P Note (Signed)
 History and Physical Interval Note:  12/22/2024 4:56 PM  Luis Salinas  has presented today for surgery, with the diagnosis of atherosclerosis left lower.  The various methods of treatment have been discussed with the patient and family. After consideration of risks, benefits and other options for treatment, the patient has consented to  Procedures: ABDOMINAL AORTOGRAM W/LOWER EXTREMITY (N/A) Peripheral Vascular Ultrasound/IVUS (Left) LOWER EXTREMITY INTERVENTION (Left) as a surgical intervention.  The patient's history has been reviewed, patient examined, no change in status, stable for surgery.  I have reviewed the patient's chart and labs.  Questions were answered to the patient's satisfaction.     Debby LOISE Robertson

## 2024-12-22 NOTE — Op Note (Signed)
 DATE OF SERVICE: 12/22/2024  PATIENT:  Luis Salinas  79 y.o. male  PRE-OPERATIVE DIAGNOSIS:  Threatened left lower extremity femoral popliteal artery bypass  POST-OPERATIVE DIAGNOSIS:  Same  PROCEDURE:   1) Ultrasound guided right common femoral artery access  2) Aortogram  3) Left lower extremity angiogram with second order cannulation  4) Additional left lower extremity angiogram with third order cannulation 5) Intravascular ultrasound of left femoropopliteal bypass, popliteal artery, peroneal artery 6) Complex angioplasty of left femoropopliteal bypass, popliteal artery with 3x17mm Sterling balloon 7) Conscious sedation (53 minutes)   SURGEON:  Debby SAILOR. Magda, MD  ASSISTANT: none  ANESTHESIA:   local and IV sedation  ESTIMATED BLOOD LOSS: min  LOCAL MEDICATIONS USED:  LIDOCAINE    COUNTS: confirmed correct.  PATIENT DISPOSITION:  PACU - hemodynamically stable.   Delay start of Pharmacological VTE agent (>24hrs) due to surgical blood loss or risk of bleeding: no  INDICATION FOR PROCEDURE: [name] is a 79 y.o. male with threatened left leg femoropopliteal bypass. After careful discussion of risks, benefits, and alternatives the patient was offered angiogram. The patient understood and wished to proceed.  OPERATIVE FINDINGS:   Aortogram: Renal arteries Left: patent  Right: patent Infrarenal aorta: patent Common iliac arteries: Left: patent  Right: patent Internal iliac arteries: Left: patent  Right: patnet External iliac arteries: Left: prior stenting patent without flow limiting stenosis  Right: patent  Left Lower Extremity Angiogram:  Common femoral artery: patent  Common femoral - below knee popliteal artery bypass: patent; critical stenosis in the distal anastomosis causing 95% stenosis  Profunda femoris artery: patent  Superficial femoral artery: occluded  Popliteal artery: occluded in P1/P2 segment. Severe, calcific disease below the knee near  the arterial anastmosis  Anterior tibial artery: patent to foot  Tibioperoneal trunk: patent  Peroneal artery: patent to ankle where it abruptly occludes  Posterior tibial artery: occluded  Pedal circulation: severely disadvantaged  DESCRIPTION OF PROCEDURE: After identification of the patient in the pre-operative holding area, the patient was transferred to the operating room. The patient was positioned supine on the operating room table.  Anesthesia was induced. The groins was prepped and draped in standard fashion. A surgical pause was performed confirming correct patient, procedure, and operative location.  The right groin was anesthetized with subcutaneous injection of 1% lidocaine . Using ultrasound guidance, the right common femoral artery was accessed with micropuncture technique. Fluoroscopy was used to confirm cannulation over the femoral head. The 11F micropuncture sheath was upsized to 82F.   A Benson wire was advanced into the distal aorta. Over the wire an omni flush catheter was advanced to the level of L2. Aortogram was performed - see above for details.   The left common iliac artery was selected with an omniflush catheter and glidewire guidewire. The wire was advanced into the common femoral artery. Over the wire the omni flush catheter was advanced into the external iliac artery. Selective angiography was performed - see above for details. Wire was navigated into the bypass. Selective angiogram was performed from this position.   With support of the catheter, I was able to cross the severe, calcific lesion with a V18 wire. IVUS catheter was advanced across the lesion and recording performed.  The decision was made to intervene. The patient was heparinized with 6000 units of heparin . The 82F sheath was exchanged for a 59F x 45cm sheath.   The lesions were treated with: Angioplasty 3x156mm Sterling balloon  Completion angiography revealed:  Resolution of distal  anastomotic  stenosis  The sheath was left in place to be removed in the recovery area.  Conscious sedation was administered with the use of IV fentanyl  and midazolam  under continuous physician and nurse monitoring.  Heart rate, blood pressure, and oxygen  saturation were continuously monitored.  Total sedation time was 53 minutes  Upon completion of the case instrument and sharps counts were confirmed correct. The patient was transferred to the PACU in good condition. I was present for all portions of the procedure.  PLAN: ASA / Eliquis  / Statin. Follow up with me in 4 weeks with ABI and LLE arterial duplex.  Debby SAILOR. Magda, MD [facility] Phone Number: [phone] 12/22/2024 4:57 PM

## 2024-12-22 NOTE — Progress Notes (Signed)
 Site area: Right groin  Site Prior to Removal:  Level 0 Pressure Applied For: 30 minutes Manual: Yes    Patient Status During Pull: stable  Post Pull Site:  Level 0 Post Pull Instructions Given:  Yes with teach back  Post Pull Pulses Present: bilatera pedal pulses doppler Dressing Applied:  gauze and tegaderm Bedrest begins @ 1610 Comments:

## 2024-12-23 ENCOUNTER — Other Ambulatory Visit: Payer: Self-pay | Admitting: Cardiology

## 2024-12-25 ENCOUNTER — Encounter (HOSPITAL_COMMUNITY): Payer: Self-pay | Admitting: Vascular Surgery

## 2024-12-25 ENCOUNTER — Telehealth: Payer: Self-pay | Admitting: Family Medicine

## 2024-12-25 DIAGNOSIS — I480 Paroxysmal atrial fibrillation: Secondary | ICD-10-CM

## 2024-12-25 DIAGNOSIS — J9611 Chronic respiratory failure with hypoxia: Secondary | ICD-10-CM

## 2024-12-25 DIAGNOSIS — E1169 Type 2 diabetes mellitus with other specified complication: Secondary | ICD-10-CM

## 2024-12-25 NOTE — Telephone Encounter (Unsigned)
 Copied from CRM [id number]. Topic: Clinical - Prescription Issue >> Dec 25, 2024 11:16 AM Marda MATSU wrote: Reason for CRM: Wife calling regarding the medication: apixaban  (ELIQUIS ) 5 MG TABS tablet. Pharmacy stated the cost is $640.00. She stated they can not afford that.  The insurance mentioned that we can get the medication: Warfarin at a zero cost.     Please advise.

## 2024-12-25 NOTE — Telephone Encounter (Signed)
 Yes warfarin is cheaper but you do have to come in for regular checks, at first you will have to come in every week and then once we get it under control it will be more like coming in every 4 weeks for as long as you are on warfarin.  Unfortunately it is not as stable as the newer Eliquis  so we do have to monitor to make sure that the blood counts are level.  The other option is you are hitting your deductible at the beginning of the year and so once you paid on your deductible it will come back to being cheaper.  There is also a Medicare program where you can take the deductible and move it into payments throughout the year to some extent.  If he wants more knowledge about that then please place referral to Mliss Griffin our clinical pharmacist.  If he does want to switch to warfarin then he needs to come in for an appointment so we can discuss the change and check levels appropriately Fonda Levins, MD [facility] 12/25/2024, 2:35 PM

## 2024-12-26 NOTE — Telephone Encounter (Signed)
 Referral placed placed to pharmacy.

## 2024-12-26 NOTE — Telephone Encounter (Signed)
 Copied from CRM [id number]. Topic: Clinical - Prescription Issue >> Dec 25, 2024 11:16 AM Marda MATSU wrote: Reason for CRM: Wife calling regarding the medication: apixaban  (ELIQUIS ) 5 MG TABS tablet. Pharmacy stated the cost is $640.00. She stated they can not afford that.  The insurance mentioned that we can get the medication: Warfarin at a zero cost.     Please advise. >> Dec 25, 2024  4:17 PM Graeme ORN wrote: Patient wife called back. Has not heard back. Read note as written by provider. At this time she would like to think about it. She would like the referral put in to pharmacists to discuss options and other assistance. States there may be other medications that do not cost as much as well. Thank You

## 2024-12-28 ENCOUNTER — Telehealth: Payer: Self-pay

## 2024-12-28 NOTE — Progress Notes (Signed)
 Care Guide Pharmacy Note  12/28/2024 Name: DAXTER PAULE MRN: [id number] DOB: [birth year]  Referred By: Maryanne Fonda LABOR, MD Reason for referral: Complex Care Management (Outreach to schedule with Pharm, d)   FACUNDO ALLEMAND is a 79 y.o. year old male who is a primary care patient of Dettinger, Fonda LABOR, MD.  Elsie VEAR Parkin was referred to the pharmacist for assistance related to: Atrial Fibrillation  Successful contact was made with the patient to discuss pharmacy services including being ready for the pharmacist to call at least 5 minutes before the scheduled appointment time and to have medication bottles and any blood pressure readings ready for review. The patient agreed to meet with the pharmacist via telephone visit on (date/time).01/09/2025  Jeoffrey Buffalo , RMA     [place]  [facility], [facility] Guide  Direct Dial: [phone]  Website: [email or web address]

## 2024-12-28 NOTE — Progress Notes (Signed)
 Care Guide Pharmacy Note  12/28/2024 Name: Luis Salinas MRN: [id number] DOB: Nov 04, 1946  Referred By: Maryanne Fonda LABOR, MD Reason for referral: Complex Care Management (Outreach to schedule with Pharm, d)   Luis Salinas is a 79 y.o. year old male who is a primary care patient of Dettinger, Fonda LABOR, MD.  Luis Salinas was referred to the pharmacist for assistance related to: Atrial Fibrillation  An unsuccessful telephone outreach was attempted today to contact the patient who was referred to the pharmacy team for assistance with medication assistance. Additional attempts will be made to contact the patient.  Jeoffrey Buffalo , RMA     [facility]  [facility], [facility] Guide  Direct Dial: [phone]  Website: [email or web address]

## 2025-01-02 ENCOUNTER — Telehealth: Payer: Self-pay

## 2025-01-02 NOTE — Telephone Encounter (Signed)
 Referral has been placed to julie no samples at this time .

## 2025-01-02 NOTE — Telephone Encounter (Signed)
 Copied from CRM [id number]. Topic: Clinical - Prescription Issue >> Dec 25, 2024 11:16 AM Marda MATSU wrote: Reason for CRM: Wife calling regarding the medication: apixaban  (ELIQUIS ) 5 MG TABS tablet. Pharmacy stated the cost is $640.00. She stated they can not afford that.  The insurance mentioned that we can get the medication: Warfarin at a zero cost.     Please advise. >> Jan 02, 2025  1:50 PM Donna BRAVO wrote: Patient wife calling in asking for samples of theses medications, enough to hold over until appt in February apixaban  (ELIQUIS ) 5 MG TABS tablet furosemide  (LASIX ) 40 MG tablet  Patient was informed they will receive a call back within 1 business day.  >> Dec 25, 2024  4:17 PM Graeme ORN wrote: Patient wife called back. Has not heard back. Read note as written by provider. At this time she would like to think about it. She would like the referral put in to pharmacists to discuss options and other assistance. States there may be other medications that do not cost as much as well. Thank You

## 2025-01-03 ENCOUNTER — Telehealth: Payer: Self-pay

## 2025-01-03 ENCOUNTER — Other Ambulatory Visit: Payer: Self-pay

## 2025-01-03 MED ORDER — APIXABAN 5 MG PO TABS: 5.0000 mg | ORAL_TABLET | Freq: Two times a day (BID) | ORAL | Status: AC

## 2025-01-03 NOTE — Telephone Encounter (Signed)
 Copied from CRM [id number]. Topic: Clinical - Prescription Issue >> Dec 25, 2024 11:16 AM Marda MATSU wrote: Reason for CRM: Wife calling regarding the medication: apixaban  (ELIQUIS ) 5 MG TABS tablet. Pharmacy stated the cost is $640.00. She stated they can not afford that.  The insurance mentioned that we can get the medication: Warfarin at a zero cost.     Please advise. >> Jan 03, 2025 12:36 PM Graeme ORN wrote: Patient wife called. States he took his last pill today. Is now out of blood thinners. She has not heard anything back. Let her know referral was put into [facility]. She wants to make sure she calls [phone]. Encounter already closed unable to edit. Thank You  >> Jan 02, 2025  1:50 PM Donna BRAVO wrote: Patient wife calling in asking for samples of theses medications, enough to hold over until appt in February apixaban  (ELIQUIS ) 5 MG TABS tablet furosemide  (LASIX ) 40 MG tablet  Patient was informed they will receive a call back within 1 business day.  >> Dec 25, 2024  4:17 PM Graeme ORN wrote: Patient wife called back. Has not heard back. Read note as written by provider. At this time she would like to think about it. She would like the referral put in to pharmacists to discuss options and other assistance. States there may be other medications that do not cost as much as well. Thank You

## 2025-01-03 NOTE — Telephone Encounter (Signed)
 Found samples in the closet, that should be enough to get him through, if not please schedule him a sooner appointment so we can get him transitioned.  Warfarin is free the medicine but you do have to come in regularly for your checkups, frequently every 2 to 4 weeks, at the beginning will be every 1 to 2 weeks until we get settled down

## 2025-01-03 NOTE — Telephone Encounter (Addendum)
 Wife made aware. She will come by and pick up. Wife states that pt is going to continue on Eliquis  and that pt will have new insurance soon. Pt does not want to start Warfarin.

## 2025-01-08 NOTE — Progress Notes (Addendum)
 "  01/09/2025 Name: Luis Salinas MRN: [id number] DOB: [birth year]  Chief Complaint  Patient presents with   Medication Management   Luis Salinas is a 79 y.o. year old male who presented for a telephone visit. They were referred to the pharmacist by their PCP for assistance in managing medication access.   Subjective: Patient and wife had concerns of Eliquis  cost. Most recent cost was $640. Patient's wife called insurance who recommended patient switch to warfarin for no cost. Dr. Maryanne explained frequent blood checks with warfarin and referred patient to PharmD. Patient presents to his telephone visit for medication management. Patient is accompanied by his wife. His wife reports that he recently switched insurance to make medications more affordable Patient previously enrolled in Medicare Extra Help. When he switched his insurance, he was also signed up for Medicare Extra Help. Patient's wife endorses that they have enough Eliquis  for now as she was able to pick up a few tablets from the pharmacy and was provided Eliquis  samples from the office. Patients new insurance will be active in February 2026. Discussed other cost options such as Medicare Payment Plan. Patient denies any other needs from pharmacy at this time.  Care Team: Primary Care Provider: Dettinger, Fonda LABOR, MD ; Next Scheduled Visit: 03/13/35  Medication Access/Adherence  Current Pharmacy:  [id number] - [place], [place] - [address] [place] [address] Phone: [phone] Fax: [phone]  [facility] [address] [place] [address] Phone: [phone] Fax: [phone]  Patient reports affordability concerns with their medications: Yes  Patient reports access/transportation concerns to their pharmacy: No  Patient reports adherence concerns with their medications:  Yes  due to medication cost  Objective: Lab Results  Component Value Date   HGBA1C 5.7 (H)  12/11/2024   Lab Results  Component Value Date   CREATININE 1.10 12/22/2024   BUN 20 12/22/2024   NA 141 12/22/2024   K 4.4 12/22/2024   CL 102 12/22/2024   CO2 24 12/11/2024   Lab Results  Component Value Date   CHOL 118 12/11/2024   HDL 49 12/11/2024   LDLCALC 47 12/11/2024   TRIG 127 12/11/2024   CHOLHDL 2.4 12/11/2024   Medications Reviewed Today     Reviewed by Mamie Jenkins HERO, RPH (Pharmacist) on 01/09/25 at 1423  Med List Status: <None>   Medication Order Taking? Sig Documenting Provider Last Dose Status Informant  ACCU-CHEK GUIDE test strip [id number]  USE TO CHECK SUGAR UP TO 4 TIMES DAILY AS DIRECTED [provider]  Active Spouse/Significant Other  Accu-Chek Softclix Lancets lancets [id number]  SMARTSIG:Topical 1-4 Times Daily [provider]  Active Spouse/Significant Other  acetaminophen  (TYLENOL ) 650 MG CR tablet [id number] Yes Take 650-1,300 mg by mouth every 8 (eight) hours as needed for pain. [provider]  Active Spouse/Significant Other  albuterol  (VENTOLIN  HFA) 108 (90 Base) MCG/ACT inhaler [id number] Yes Inhale 2 puffs into the lungs every 6 (six) hours as needed for wheezing or shortness of breath. Dettinger, Fonda LABOR, MD  Active Spouse/Significant Other  apixaban  (ELIQUIS ) 5 MG TABS tablet [id number]  Take 1 tablet (5 mg total) by mouth 2 (two) times daily. Bethanie Cough, PA-C  Active Spouse/Significant Other  apixaban  (ELIQUIS ) 5 MG TABS tablet [id number] Yes Take 1 tablet (5 mg total) by mouth 2 (two) times daily. Dettinger, Fonda LABOR, MD  Active   ASPIRIN  LOW DOSE 81 MG tablet [id number] Yes TAKE 1 TABLET BY MOUTH EVERY  DAY Dettinger, Fonda LABOR, MD  Active Spouse/Significant Other  b complex vitamins capsule [id number] Yes Take 1 capsule by mouth daily. [provider]  Active Spouse/Significant Other  blood glucose meter kit and supplies KIT [id number]  Dispense based on patient and insurance preference. Use up to four times daily  as directed. Ezenduka, Nkeiruka J, MD  Active Spouse/Significant Other  budesonide -glycopyrrolate-formoterol  (BREZTRI  AEROSPHERE) 160-9-4.8 MCG/ACT AERO inhaler [id number] Yes Inhale 2 puffs into the lungs 2 (two) times daily. Dettinger, Fonda LABOR, MD  Active Spouse/Significant Other  diphenhydramine-acetaminophen  (TYLENOL  PM) 25-500 MG TABS tablet [id number] Yes Take 1-2 tablets by mouth at bedtime. [provider]  Active Spouse/Significant Other  furosemide  (LASIX ) 40 MG tablet [id number] Yes Take 1 tablet (40 mg total) by mouth daily. Dettinger, Fonda LABOR, MD  Active Spouse/Significant Other  Multiple Vitamins-Minerals (PRESERVISION AREDS 2 PO) [id number] Yes Take 1 tablet by mouth 2 (two) times daily. [provider]  Active Spouse/Significant Other  nebivolol  (BYSTOLIC ) 10 MG tablet [id number] Yes Take 1 tablet (10 mg total) by mouth daily. May take an extra 5 mg daily as needed for palpitations Emelia Josefa HERO, NP  Active Spouse/Significant Other  nitroGLYCERIN  (NITROSTAT ) 0.4 MG SL tablet [id number] Yes Place 1 tablet (0.4 mg total) under the tongue every 5 (five) minutes as needed for chest pain. Dettinger, Fonda LABOR, MD  Active Spouse/Significant Other  OXYGEN  [id number]  Inhale 4 L into the lungs daily. [provider]  Active Spouse/Significant Other  potassium chloride  SA (KLOR-CON  M) 20 MEQ tablet [id number] Yes TAKE 1 TABLET BY MOUTH EVERY DAY Dettinger, Fonda LABOR, MD  Active Spouse/Significant Other  rosuvastatin  (CRESTOR ) 40 MG tablet [id number] Yes Take 1 tablet (40 mg total) by mouth daily. Dettinger, Fonda LABOR, MD  Active Spouse/Significant Other            Assessment/Plan:  Medication Access: Patient reported being unable to afford Eliquis  medication due to cost of $640 Patient recently switched insurances for prescription affordability and applied for Medicare Extra Help Previous enrolled in Extra Help Reviewed Medicare Payment Plan with patient and  wife Wife would like to continue with Medicare Extra Help Recommended patient reach out to PCP or pharmacy with any other medication access concerns  Follow Up Plan: As needed with pharmacy team  Jenkins Graces, PharmD PGY1 Pharmacy Resident  Mliss Tarry Griffin, PharmD, BCACP, CPP Clinical Pharmacist, [facility]     "

## 2025-01-09 ENCOUNTER — Other Ambulatory Visit (INDEPENDENT_AMBULATORY_CARE_PROVIDER_SITE_OTHER): Admitting: Pharmacist

## 2025-01-18 NOTE — Progress Notes (Signed)
 Luis Salinas                                          MRN: [id number]   01/18/2025   The [facility] Quality Team Specialist reviewed this patient medical record for the purposes of chart review for care gap closure. The following were reviewed: chart review for care gap closure-kidney health evaluation for diabetes:eGFR  and uACR.    [facility]BCI Quality Team

## 2025-03-12 ENCOUNTER — Ambulatory Visit: Admitting: Family Medicine

## 2025-06-11 ENCOUNTER — Ambulatory Visit

## 2025-06-14 ENCOUNTER — Ambulatory Visit: Payer: Self-pay

## 2025-06-19 ENCOUNTER — Ambulatory Visit
